# Patient Record
Sex: Female | Born: 1945
Health system: Southern US, Community
[De-identification: ages and names within clinical notes are randomized; demographics above are authoritative.]

## PROBLEM LIST (undated history)

## (undated) DIAGNOSIS — D509 Iron deficiency anemia, unspecified: Secondary | ICD-10-CM

## (undated) DIAGNOSIS — R0600 Dyspnea, unspecified: Secondary | ICD-10-CM

## (undated) DIAGNOSIS — D34 Benign neoplasm of thyroid gland: Secondary | ICD-10-CM

## (undated) DIAGNOSIS — C3490 Malignant neoplasm of unspecified part of unspecified bronchus or lung: Secondary | ICD-10-CM

## (undated) DIAGNOSIS — I2510 Atherosclerotic heart disease of native coronary artery without angina pectoris: Secondary | ICD-10-CM

## (undated) DIAGNOSIS — C801 Malignant (primary) neoplasm, unspecified: Secondary | ICD-10-CM

## (undated) DIAGNOSIS — J860 Pyothorax with fistula: Secondary | ICD-10-CM

## (undated) DIAGNOSIS — I10 Essential (primary) hypertension: Secondary | ICD-10-CM

## (undated) DIAGNOSIS — I739 Peripheral vascular disease, unspecified: Secondary | ICD-10-CM

## (undated) DIAGNOSIS — G2581 Restless legs syndrome: Secondary | ICD-10-CM

## (undated) DIAGNOSIS — J449 Chronic obstructive pulmonary disease, unspecified: Secondary | ICD-10-CM

## (undated) HISTORY — DX: Benign neoplasm of thyroid gland: D34

## (undated) HISTORY — DX: Peripheral vascular disease, unspecified: I73.9

## (undated) HISTORY — PX: CHOLECYSTECTOMY: SHX55

## (undated) HISTORY — DX: Pyothorax with fistula: J86.0

## (undated) HISTORY — DX: Malignant (primary) neoplasm, unspecified: C80.1

## (undated) HISTORY — DX: Atherosclerotic heart disease of native coronary artery without angina pectoris: I25.10

## (undated) HISTORY — DX: Essential (primary) hypertension: I10

## (undated) HISTORY — DX: Restless legs syndrome: G25.81

## (undated) HISTORY — DX: Malignant neoplasm of unspecified part of unspecified bronchus or lung: C34.90

## (undated) HISTORY — PX: OTHER SURGICAL HISTORY: SHX169

## (undated) HISTORY — DX: Iron deficiency anemia, unspecified: D50.9

## (undated) HISTORY — DX: Chronic obstructive pulmonary disease, unspecified: J44.9

## (undated) HISTORY — PX: APPENDECTOMY: SHX54

---

## 2003-10-21 HISTORY — PX: OTHER SURGICAL HISTORY: SHX169

## 2005-09-29 ENCOUNTER — Ambulatory Visit: Payer: Self-pay | Admitting: Family Medicine

## 2005-10-06 ENCOUNTER — Ambulatory Visit: Payer: Self-pay | Admitting: Family Medicine

## 2005-10-20 ENCOUNTER — Ambulatory Visit: Payer: Self-pay | Admitting: Family Medicine

## 2005-10-20 DIAGNOSIS — D34 Benign neoplasm of thyroid gland: Secondary | ICD-10-CM

## 2005-10-20 DIAGNOSIS — C801 Malignant (primary) neoplasm, unspecified: Secondary | ICD-10-CM

## 2005-10-20 HISTORY — DX: Benign neoplasm of thyroid gland: D34

## 2005-10-20 HISTORY — DX: Malignant (primary) neoplasm, unspecified: C80.1

## 2005-10-20 HISTORY — PX: THYROIDECTOMY, PARTIAL: SHX18

## 2005-11-18 ENCOUNTER — Ambulatory Visit: Payer: Self-pay | Admitting: Family Medicine

## 2005-12-30 ENCOUNTER — Ambulatory Visit: Payer: Self-pay | Admitting: Family Medicine

## 2006-01-23 ENCOUNTER — Ambulatory Visit: Payer: Self-pay | Admitting: Family Medicine

## 2006-02-13 ENCOUNTER — Ambulatory Visit (HOSPITAL_COMMUNITY): Admission: RE | Admit: 2006-02-13 | Discharge: 2006-02-13 | Payer: Self-pay | Admitting: Thoracic Surgery

## 2006-02-20 ENCOUNTER — Ambulatory Visit: Admission: RE | Admit: 2006-02-20 | Discharge: 2006-04-24 | Payer: Self-pay | Admitting: Radiation Oncology

## 2006-02-25 ENCOUNTER — Ambulatory Visit (HOSPITAL_COMMUNITY): Admission: RE | Admit: 2006-02-25 | Discharge: 2006-02-25 | Payer: Self-pay | Admitting: Radiation Oncology

## 2006-02-26 ENCOUNTER — Ambulatory Visit: Payer: Self-pay | Admitting: Family Medicine

## 2006-03-04 ENCOUNTER — Other Ambulatory Visit: Admission: RE | Admit: 2006-03-04 | Discharge: 2006-03-04 | Payer: Self-pay | Admitting: Interventional Radiology

## 2006-03-04 ENCOUNTER — Encounter (INDEPENDENT_AMBULATORY_CARE_PROVIDER_SITE_OTHER): Payer: Self-pay | Admitting: Specialist

## 2006-03-04 ENCOUNTER — Encounter: Admission: RE | Admit: 2006-03-04 | Discharge: 2006-03-04 | Payer: Self-pay | Admitting: Radiation Oncology

## 2006-03-12 ENCOUNTER — Inpatient Hospital Stay (HOSPITAL_COMMUNITY): Admission: RE | Admit: 2006-03-12 | Discharge: 2006-03-19 | Payer: Self-pay | Admitting: Thoracic Surgery

## 2006-03-12 ENCOUNTER — Encounter (INDEPENDENT_AMBULATORY_CARE_PROVIDER_SITE_OTHER): Payer: Self-pay | Admitting: *Deleted

## 2006-03-17 ENCOUNTER — Ambulatory Visit: Payer: Self-pay | Admitting: Internal Medicine

## 2006-03-19 ENCOUNTER — Ambulatory Visit: Payer: Self-pay | Admitting: Internal Medicine

## 2006-03-24 ENCOUNTER — Encounter: Admission: RE | Admit: 2006-03-24 | Discharge: 2006-03-24 | Payer: Self-pay | Admitting: Thoracic Surgery

## 2006-03-25 ENCOUNTER — Encounter: Admission: RE | Admit: 2006-03-25 | Discharge: 2006-03-25 | Payer: Self-pay | Admitting: Thoracic Surgery

## 2006-03-31 ENCOUNTER — Encounter: Admission: RE | Admit: 2006-03-31 | Discharge: 2006-03-31 | Payer: Self-pay | Admitting: Thoracic Surgery

## 2006-04-21 ENCOUNTER — Encounter: Admission: RE | Admit: 2006-04-21 | Discharge: 2006-04-21 | Payer: Self-pay | Admitting: Thoracic Surgery

## 2006-04-23 ENCOUNTER — Ambulatory Visit: Payer: Self-pay | Admitting: Family Medicine

## 2006-04-27 ENCOUNTER — Ambulatory Visit: Payer: Self-pay | Admitting: Internal Medicine

## 2006-04-27 LAB — BASIC METABOLIC PANEL
BUN: 9 mg/dL (ref 6–23)
CO2: 29 mEq/L (ref 19–32)
Calcium: 9.8 mg/dL (ref 8.4–10.5)
Chloride: 90 mEq/L — ABNORMAL LOW (ref 96–112)
Creatinine, Ser: 0.48 mg/dL (ref 0.40–1.20)
Glucose, Bld: 99 mg/dL (ref 70–99)
Potassium: 4.2 mEq/L (ref 3.5–5.3)
Sodium: 131 mEq/L — ABNORMAL LOW (ref 135–145)

## 2006-04-27 LAB — CBC WITH DIFFERENTIAL/PLATELET
BASO%: 0.3 % (ref 0.0–2.0)
Basophils Absolute: 0 10*3/uL (ref 0.0–0.1)
EOS%: 0.2 % (ref 0.0–7.0)
Eosinophils Absolute: 0 10*3/uL (ref 0.0–0.5)
HCT: 34.9 % (ref 34.8–46.6)
HGB: 12.3 g/dL (ref 11.6–15.9)
LYMPH%: 10.7 % — ABNORMAL LOW (ref 14.0–48.0)
MCH: 31.8 pg (ref 26.0–34.0)
MCHC: 35.1 g/dL (ref 32.0–36.0)
MCV: 90.7 fL (ref 81.0–101.0)
MONO#: 1.4 10*3/uL — ABNORMAL HIGH (ref 0.1–0.9)
MONO%: 14.7 % — ABNORMAL HIGH (ref 0.0–13.0)
NEUT#: 7.1 10*3/uL — ABNORMAL HIGH (ref 1.5–6.5)
NEUT%: 74.1 % (ref 39.6–76.8)
Platelets: 513 10*3/uL — ABNORMAL HIGH (ref 145–400)
RBC: 3.85 10*6/uL (ref 3.70–5.32)
RDW: 13.1 % (ref 11.3–14.5)
WBC: 9.5 10*3/uL (ref 3.9–10.0)
lymph#: 1 10*3/uL (ref 0.9–3.3)

## 2006-05-06 ENCOUNTER — Ambulatory Visit: Payer: Self-pay | Admitting: Family Medicine

## 2006-05-27 ENCOUNTER — Ambulatory Visit: Payer: Self-pay | Admitting: Family Medicine

## 2006-06-18 ENCOUNTER — Encounter (INDEPENDENT_AMBULATORY_CARE_PROVIDER_SITE_OTHER): Payer: Self-pay | Admitting: *Deleted

## 2006-06-18 ENCOUNTER — Ambulatory Visit (HOSPITAL_COMMUNITY): Admission: RE | Admit: 2006-06-18 | Discharge: 2006-06-19 | Payer: Self-pay | Admitting: Surgery

## 2006-06-30 ENCOUNTER — Ambulatory Visit: Payer: Self-pay | Admitting: Family Medicine

## 2006-07-16 ENCOUNTER — Ambulatory Visit: Payer: Self-pay | Admitting: Internal Medicine

## 2006-07-20 ENCOUNTER — Ambulatory Visit (HOSPITAL_COMMUNITY): Admission: RE | Admit: 2006-07-20 | Discharge: 2006-07-20 | Payer: Self-pay | Admitting: Internal Medicine

## 2006-07-20 LAB — COMPREHENSIVE METABOLIC PANEL
ALT: 21 U/L (ref 0–40)
AST: 18 U/L (ref 0–37)
Albumin: 2.9 g/dL — ABNORMAL LOW (ref 3.5–5.2)
Alkaline Phosphatase: 142 U/L — ABNORMAL HIGH (ref 39–117)
BUN: 7 mg/dL (ref 6–23)
CO2: 33 mEq/L — ABNORMAL HIGH (ref 19–32)
Calcium: 9.4 mg/dL (ref 8.4–10.5)
Chloride: 93 mEq/L — ABNORMAL LOW (ref 96–112)
Creatinine, Ser: 0.56 mg/dL (ref 0.40–1.20)
Glucose, Bld: 105 mg/dL — ABNORMAL HIGH (ref 70–99)
Potassium: 4.5 mEq/L (ref 3.5–5.3)
Sodium: 133 mEq/L — ABNORMAL LOW (ref 135–145)
Total Bilirubin: 0.4 mg/dL (ref 0.3–1.2)
Total Protein: 7.1 g/dL (ref 6.0–8.3)

## 2006-07-20 LAB — CBC WITH DIFFERENTIAL/PLATELET
BASO%: 0.6 % (ref 0.0–2.0)
Basophils Absolute: 0 10*3/uL (ref 0.0–0.1)
EOS%: 2.7 % (ref 0.0–7.0)
Eosinophils Absolute: 0.2 10*3/uL (ref 0.0–0.5)
HCT: 33.4 % — ABNORMAL LOW (ref 34.8–46.6)
HGB: 11.6 g/dL (ref 11.6–15.9)
LYMPH%: 15.2 % (ref 14.0–48.0)
MCH: 30.1 pg (ref 26.0–34.0)
MCHC: 34.8 g/dL (ref 32.0–36.0)
MCV: 86.4 fL (ref 81.0–101.0)
MONO#: 0.8 10*3/uL (ref 0.1–0.9)
MONO%: 13.2 % — ABNORMAL HIGH (ref 0.0–13.0)
NEUT#: 3.9 10*3/uL (ref 1.5–6.5)
NEUT%: 68.3 % (ref 39.6–76.8)
Platelets: 647 10*3/uL — ABNORMAL HIGH (ref 145–400)
RBC: 3.87 10*6/uL (ref 3.70–5.32)
RDW: 13.4 % (ref 11.3–14.5)
WBC: 5.7 10*3/uL (ref 3.9–10.0)
lymph#: 0.9 10*3/uL (ref 0.9–3.3)

## 2006-07-28 DIAGNOSIS — I70219 Atherosclerosis of native arteries of extremities with intermittent claudication, unspecified extremity: Secondary | ICD-10-CM | POA: Insufficient documentation

## 2006-07-28 DIAGNOSIS — Z85118 Personal history of other malignant neoplasm of bronchus and lung: Secondary | ICD-10-CM | POA: Insufficient documentation

## 2006-07-28 DIAGNOSIS — F411 Generalized anxiety disorder: Secondary | ICD-10-CM | POA: Insufficient documentation

## 2006-07-28 DIAGNOSIS — F339 Major depressive disorder, recurrent, unspecified: Secondary | ICD-10-CM | POA: Insufficient documentation

## 2006-07-28 DIAGNOSIS — E7800 Pure hypercholesterolemia, unspecified: Secondary | ICD-10-CM | POA: Insufficient documentation

## 2006-07-28 DIAGNOSIS — J449 Chronic obstructive pulmonary disease, unspecified: Secondary | ICD-10-CM | POA: Insufficient documentation

## 2006-07-28 DIAGNOSIS — I10 Essential (primary) hypertension: Secondary | ICD-10-CM

## 2006-08-21 ENCOUNTER — Ambulatory Visit: Payer: Self-pay | Admitting: Family Medicine

## 2006-08-25 ENCOUNTER — Encounter: Payer: Self-pay | Admitting: Family Medicine

## 2006-08-25 ENCOUNTER — Ambulatory Visit: Payer: Self-pay | Admitting: Family Medicine

## 2006-09-04 ENCOUNTER — Telehealth: Payer: Self-pay | Admitting: Family Medicine

## 2006-09-07 ENCOUNTER — Ambulatory Visit: Payer: Self-pay | Admitting: Family Medicine

## 2006-09-09 LAB — CONVERTED CEMR LAB
ALT: 16 units/L (ref 0–35)
AST: 12 units/L (ref 0–37)
Albumin: 3.5 g/dL (ref 3.5–5.2)
Alkaline Phosphatase: 141 units/L — ABNORMAL HIGH (ref 39–117)
BUN: 9 mg/dL (ref 6–23)
Basophils Absolute: 0 10*3/uL (ref 0.0–0.1)
Basophils Relative: 0 % (ref 0–1)
CO2: 27 meq/L (ref 19–32)
Calcium: 9.2 mg/dL (ref 8.4–10.5)
Chloride: 90 meq/L — ABNORMAL LOW (ref 96–112)
Creatinine, Ser: 0.51 mg/dL (ref 0.40–1.20)
Eosinophils Relative: 1 % (ref 0–4)
Glucose, Bld: 137 mg/dL — ABNORMAL HIGH (ref 70–99)
HCT: 31 % — ABNORMAL LOW (ref 34.4–43.3)
Hemoglobin: 9.9 g/dL — ABNORMAL LOW (ref 11.7–14.8)
Lymphocytes Relative: 8 % — ABNORMAL LOW (ref 15–43)
Lymphs Abs: 0.9 10*3/uL (ref 0.8–3.1)
MCHC: 31.9 g/dL — ABNORMAL LOW (ref 33.1–35.4)
MCV: 88.3 fL (ref 78.8–100.0)
Monocytes Absolute: 1.3 10*3/uL — ABNORMAL HIGH (ref 0.2–0.7)
Monocytes Relative: 11 % (ref 3–11)
Neutro Abs: 9.3 10*3/uL — ABNORMAL HIGH (ref 1.8–6.8)
Neutrophils Relative %: 80 % — ABNORMAL HIGH (ref 47–77)
Platelets: 762 10*3/uL — ABNORMAL HIGH (ref 152–374)
Potassium: 4.5 meq/L (ref 3.5–5.3)
RBC: 3.51 M/uL — ABNORMAL LOW (ref 3.79–4.96)
RDW: 14.4 % (ref 11.5–15.3)
Sodium: 131 meq/L — ABNORMAL LOW (ref 135–145)
Total Bilirubin: 0.2 mg/dL — ABNORMAL LOW (ref 0.3–1.2)
Total Protein: 7.5 g/dL (ref 6.0–8.3)
WBC: 11.6 10*3/uL — ABNORMAL HIGH (ref 3.7–10.0)

## 2006-09-15 ENCOUNTER — Encounter: Payer: Self-pay | Admitting: Family Medicine

## 2006-10-05 ENCOUNTER — Telehealth: Payer: Self-pay | Admitting: Family Medicine

## 2006-10-14 ENCOUNTER — Telehealth: Payer: Self-pay | Admitting: Family Medicine

## 2006-10-15 ENCOUNTER — Encounter: Payer: Self-pay | Admitting: Family Medicine

## 2006-10-26 ENCOUNTER — Ambulatory Visit: Payer: Self-pay | Admitting: Internal Medicine

## 2006-10-29 ENCOUNTER — Ambulatory Visit (HOSPITAL_COMMUNITY): Admission: RE | Admit: 2006-10-29 | Discharge: 2006-10-29 | Payer: Self-pay | Admitting: Internal Medicine

## 2006-10-29 ENCOUNTER — Encounter (HOSPITAL_COMMUNITY): Admission: RE | Admit: 2006-10-29 | Discharge: 2006-12-30 | Payer: Self-pay | Admitting: Internal Medicine

## 2006-10-29 LAB — CBC WITH DIFFERENTIAL/PLATELET
BASO%: 0 % (ref 0.0–2.0)
Basophils Absolute: 0 10*3/uL (ref 0.0–0.1)
EOS%: 0.3 % (ref 0.0–7.0)
Eosinophils Absolute: 0 10*3/uL (ref 0.0–0.5)
HCT: 23.4 % — ABNORMAL LOW (ref 34.8–46.6)
HGB: 7.8 g/dL — ABNORMAL LOW (ref 11.6–15.9)
LYMPH%: 5.7 % — ABNORMAL LOW (ref 14.0–48.0)
MCH: 27.3 pg (ref 26.0–34.0)
MCHC: 33.5 g/dL (ref 32.0–36.0)
MCV: 81.5 fL (ref 81.0–101.0)
MONO#: 1.1 10*3/uL — ABNORMAL HIGH (ref 0.1–0.9)
MONO%: 8.7 % (ref 0.0–13.0)
NEUT#: 10.8 10*3/uL — ABNORMAL HIGH (ref 1.5–6.5)
NEUT%: 85.3 % — ABNORMAL HIGH (ref 39.6–76.8)
Platelets: 1115 10*3/uL — ABNORMAL HIGH (ref 145–400)
RBC: 2.87 10*6/uL — ABNORMAL LOW (ref 3.70–5.32)
RDW: 15.7 % — ABNORMAL HIGH (ref 11.3–14.5)
WBC: 12.7 10*3/uL — ABNORMAL HIGH (ref 3.9–10.0)
lymph#: 0.7 10*3/uL — ABNORMAL LOW (ref 0.9–3.3)

## 2006-10-29 LAB — LACTATE DEHYDROGENASE: LDH: 94 U/L (ref 94–250)

## 2006-10-29 LAB — COMPREHENSIVE METABOLIC PANEL
ALT: 10 U/L (ref 0–35)
AST: 10 U/L (ref 0–37)
Albumin: 1.9 g/dL — ABNORMAL LOW (ref 3.5–5.2)
Alkaline Phosphatase: 128 U/L — ABNORMAL HIGH (ref 39–117)
BUN: 9 mg/dL (ref 6–23)
CO2: 31 mEq/L (ref 19–32)
Calcium: 8.3 mg/dL — ABNORMAL LOW (ref 8.4–10.5)
Chloride: 91 mEq/L — ABNORMAL LOW (ref 96–112)
Creatinine, Ser: 0.41 mg/dL (ref 0.40–1.20)
Glucose, Bld: 130 mg/dL — ABNORMAL HIGH (ref 70–99)
Potassium: 3.8 mEq/L (ref 3.5–5.3)
Sodium: 129 mEq/L — ABNORMAL LOW (ref 135–145)
Total Bilirubin: 0.5 mg/dL (ref 0.3–1.2)
Total Protein: 6.8 g/dL (ref 6.0–8.3)

## 2006-10-30 LAB — TYPE & CROSSMATCH - CHCC

## 2006-11-02 LAB — CBC WITH DIFFERENTIAL/PLATELET
BASO%: 0.2 % (ref 0.0–2.0)
Basophils Absolute: 0 10*3/uL (ref 0.0–0.1)
EOS%: 0.2 % (ref 0.0–7.0)
Eosinophils Absolute: 0 10*3/uL (ref 0.0–0.5)
HCT: 30.9 % — ABNORMAL LOW (ref 34.8–46.6)
HGB: 10.2 g/dL — ABNORMAL LOW (ref 11.6–15.9)
LYMPH%: 5.6 % — ABNORMAL LOW (ref 14.0–48.0)
MCH: 27.2 pg (ref 26.0–34.0)
MCHC: 33.1 g/dL (ref 32.0–36.0)
MCV: 82.1 fL (ref 81.0–101.0)
MONO#: 1 10*3/uL — ABNORMAL HIGH (ref 0.1–0.9)
MONO%: 7.8 % (ref 0.0–13.0)
NEUT#: 11.4 10*3/uL — ABNORMAL HIGH (ref 1.5–6.5)
NEUT%: 86.2 % — ABNORMAL HIGH (ref 39.6–76.8)
Platelets: 1073 10*3/uL — ABNORMAL HIGH (ref 145–400)
RBC: 3.77 10*6/uL (ref 3.70–5.32)
RDW: 15.8 % — ABNORMAL HIGH (ref 11.3–14.5)
WBC: 13.2 10*3/uL — ABNORMAL HIGH (ref 3.9–10.0)
lymph#: 0.7 10*3/uL — ABNORMAL LOW (ref 0.9–3.3)

## 2006-11-03 LAB — IRON AND TIBC
Iron: <10 ug/dL — ABNORMAL LOW (ref 42–145)
UIBC: 166 ug/dL

## 2006-11-03 LAB — FERRITIN: Ferritin: 303 ng/mL — ABNORMAL HIGH (ref 10–291)

## 2006-11-03 LAB — VITAMIN B12: Vitamin B-12: 879 pg/mL (ref 211–911)

## 2006-11-03 LAB — FOLATE RBC: RBC Folate: 618 ng/mL — ABNORMAL HIGH (ref 180–600)

## 2006-11-09 ENCOUNTER — Encounter: Payer: Self-pay | Admitting: Family Medicine

## 2006-11-09 LAB — CBC WITH DIFFERENTIAL/PLATELET
BASO%: 0.5 % (ref 0.0–2.0)
Basophils Absolute: 0.1 10*3/uL (ref 0.0–0.1)
EOS%: 1.6 % (ref 0.0–7.0)
Eosinophils Absolute: 0.2 10*3/uL (ref 0.0–0.5)
HCT: 29.4 % — ABNORMAL LOW (ref 34.8–46.6)
HGB: 9.6 g/dL — ABNORMAL LOW (ref 11.6–15.9)
LYMPH%: 7.4 % — ABNORMAL LOW (ref 14.0–48.0)
MCH: 26.4 pg (ref 26.0–34.0)
MCHC: 32.7 g/dL (ref 32.0–36.0)
MCV: 80.7 fL — ABNORMAL LOW (ref 81.0–101.0)
MONO#: 1 10*3/uL — ABNORMAL HIGH (ref 0.1–0.9)
MONO%: 7.2 % (ref 0.0–13.0)
NEUT#: 11.9 10*3/uL — ABNORMAL HIGH (ref 1.5–6.5)
NEUT%: 83.3 % — ABNORMAL HIGH (ref 39.6–76.8)
Platelets: 902 10*3/uL — ABNORMAL HIGH (ref 145–400)
RBC: 3.64 10*6/uL — ABNORMAL LOW (ref 3.70–5.32)
RDW: 13.1 % (ref 11.3–14.5)
WBC: 14.3 10*3/uL — ABNORMAL HIGH (ref 3.9–10.0)
lymph#: 1.1 10*3/uL (ref 0.9–3.3)

## 2006-11-12 ENCOUNTER — Encounter (INDEPENDENT_AMBULATORY_CARE_PROVIDER_SITE_OTHER): Payer: Self-pay | Admitting: Specialist

## 2006-11-12 ENCOUNTER — Ambulatory Visit (HOSPITAL_COMMUNITY): Admission: RE | Admit: 2006-11-12 | Discharge: 2006-11-12 | Payer: Self-pay | Admitting: Thoracic Surgery

## 2006-11-13 LAB — FECAL OCCULT BLOOD, GUAIAC: Occult Blood: NEGATIVE

## 2006-11-18 ENCOUNTER — Encounter: Payer: Self-pay | Admitting: Family Medicine

## 2006-11-20 DIAGNOSIS — D509 Iron deficiency anemia, unspecified: Secondary | ICD-10-CM

## 2006-11-20 HISTORY — DX: Iron deficiency anemia, unspecified: D50.9

## 2006-11-30 ENCOUNTER — Inpatient Hospital Stay (HOSPITAL_COMMUNITY): Admission: RE | Admit: 2006-11-30 | Discharge: 2006-12-08 | Payer: Self-pay | Admitting: Thoracic Surgery

## 2006-11-30 ENCOUNTER — Encounter (INDEPENDENT_AMBULATORY_CARE_PROVIDER_SITE_OTHER): Payer: Self-pay | Admitting: *Deleted

## 2006-11-30 ENCOUNTER — Ambulatory Visit: Payer: Self-pay | Admitting: Thoracic Surgery

## 2006-12-11 ENCOUNTER — Ambulatory Visit: Payer: Self-pay | Admitting: Internal Medicine

## 2006-12-14 ENCOUNTER — Encounter (HOSPITAL_COMMUNITY): Admission: RE | Admit: 2006-12-14 | Discharge: 2007-03-14 | Payer: Self-pay | Admitting: Thoracic Surgery

## 2006-12-15 ENCOUNTER — Ambulatory Visit: Payer: Self-pay | Admitting: Thoracic Surgery

## 2006-12-15 ENCOUNTER — Encounter: Admission: RE | Admit: 2006-12-15 | Discharge: 2006-12-15 | Payer: Self-pay | Admitting: Thoracic Surgery

## 2006-12-22 ENCOUNTER — Ambulatory Visit: Payer: Self-pay | Admitting: Thoracic Surgery

## 2006-12-22 ENCOUNTER — Encounter: Admission: RE | Admit: 2006-12-22 | Discharge: 2006-12-22 | Payer: Self-pay | Admitting: Thoracic Surgery

## 2007-01-04 ENCOUNTER — Encounter: Payer: Self-pay | Admitting: Family Medicine

## 2007-01-04 DIAGNOSIS — D509 Iron deficiency anemia, unspecified: Secondary | ICD-10-CM | POA: Insufficient documentation

## 2007-01-05 ENCOUNTER — Encounter: Admission: RE | Admit: 2007-01-05 | Discharge: 2007-01-05 | Payer: Self-pay | Admitting: Thoracic Surgery

## 2007-01-05 ENCOUNTER — Ambulatory Visit: Payer: Self-pay | Admitting: Thoracic Surgery

## 2007-01-19 HISTORY — PX: OTHER SURGICAL HISTORY: SHX169

## 2007-01-20 ENCOUNTER — Encounter: Admission: RE | Admit: 2007-01-20 | Discharge: 2007-01-20 | Payer: Self-pay | Admitting: Thoracic Surgery

## 2007-01-20 ENCOUNTER — Ambulatory Visit: Payer: Self-pay | Admitting: Thoracic Surgery

## 2007-02-10 ENCOUNTER — Ambulatory Visit: Payer: Self-pay | Admitting: Thoracic Surgery

## 2007-02-10 ENCOUNTER — Encounter: Admission: RE | Admit: 2007-02-10 | Discharge: 2007-02-10 | Payer: Self-pay | Admitting: Thoracic Surgery

## 2007-02-10 ENCOUNTER — Encounter: Payer: Self-pay | Admitting: Family Medicine

## 2007-02-17 ENCOUNTER — Ambulatory Visit: Payer: Self-pay | Admitting: Family Medicine

## 2007-02-18 ENCOUNTER — Encounter: Payer: Self-pay | Admitting: Family Medicine

## 2007-02-18 ENCOUNTER — Telehealth (INDEPENDENT_AMBULATORY_CARE_PROVIDER_SITE_OTHER): Payer: Self-pay | Admitting: *Deleted

## 2007-02-18 LAB — CONVERTED CEMR LAB
ALT: 13 units/L (ref 0–35)
AST: 18 units/L (ref 0–37)
Albumin: 3.8 g/dL (ref 3.5–5.2)
Alkaline Phosphatase: 111 units/L (ref 39–117)
BUN: 9 mg/dL (ref 6–23)
Basophils Absolute: 0 10*3/uL (ref 0.0–0.1)
Basophils Relative: 1 % (ref 0–1)
CO2: 27 meq/L (ref 19–32)
Calcium: 9.2 mg/dL (ref 8.4–10.5)
Chloride: 99 meq/L (ref 96–112)
Creatinine, Ser: 0.41 mg/dL (ref 0.40–1.20)
Direct LDL: 98 mg/dL
Eosinophils Absolute: 0.1 10*3/uL (ref 0.0–0.7)
Eosinophils Relative: 2 % (ref 0–5)
Ferritin: 113 ng/mL (ref 10–291)
Glucose, Bld: 100 mg/dL — ABNORMAL HIGH (ref 70–99)
HCT: 38.8 % (ref 36.0–46.0)
Hemoglobin: 12.2 g/dL (ref 12.0–15.0)
Iron: 65 ug/dL (ref 42–145)
Lymphocytes Relative: 26 % (ref 12–46)
Lymphs Abs: 1.7 10*3/uL (ref 0.7–3.3)
MCHC: 31.4 g/dL (ref 30.0–36.0)
MCV: 91.5 fL (ref 78.0–100.0)
Monocytes Absolute: 0.7 10*3/uL (ref 0.2–0.7)
Monocytes Relative: 11 % (ref 3–11)
Neutro Abs: 3.9 10*3/uL (ref 1.7–7.7)
Neutrophils Relative %: 61 % (ref 43–77)
Platelets: 405 10*3/uL — ABNORMAL HIGH (ref 150–400)
Potassium: 4.5 meq/L (ref 3.5–5.3)
RBC: 4.24 M/uL (ref 3.87–5.11)
RDW: 16.9 % — ABNORMAL HIGH (ref 11.5–14.0)
Saturation Ratios: 24 % (ref 20–55)
Sodium: 139 meq/L (ref 135–145)
TIBC: 271 ug/dL (ref 250–470)
TSH: 3.134 microintl units/mL (ref 0.350–5.50)
Total Bilirubin: 0.3 mg/dL (ref 0.3–1.2)
Total Protein: 7.3 g/dL (ref 6.0–8.3)
UIBC: 206 ug/dL
WBC: 6.5 10*3/uL (ref 4.0–10.5)

## 2007-04-07 ENCOUNTER — Encounter: Admission: RE | Admit: 2007-04-07 | Discharge: 2007-04-07 | Payer: Self-pay | Admitting: Thoracic Surgery

## 2007-04-07 ENCOUNTER — Ambulatory Visit: Payer: Self-pay | Admitting: Thoracic Surgery

## 2007-05-05 ENCOUNTER — Encounter: Admission: RE | Admit: 2007-05-05 | Discharge: 2007-05-05 | Payer: Self-pay | Admitting: Thoracic Surgery

## 2007-05-05 ENCOUNTER — Ambulatory Visit: Payer: Self-pay | Admitting: Thoracic Surgery

## 2007-05-19 ENCOUNTER — Ambulatory Visit: Payer: Self-pay | Admitting: Family Medicine

## 2007-05-19 DIAGNOSIS — I259 Chronic ischemic heart disease, unspecified: Secondary | ICD-10-CM | POA: Insufficient documentation

## 2007-05-19 DIAGNOSIS — E890 Postprocedural hypothyroidism: Secondary | ICD-10-CM | POA: Insufficient documentation

## 2007-05-19 HISTORY — DX: Chronic ischemic heart disease, unspecified: I25.9

## 2007-06-11 ENCOUNTER — Encounter: Payer: Self-pay | Admitting: Family Medicine

## 2007-06-11 LAB — CONVERTED CEMR LAB

## 2007-07-02 ENCOUNTER — Encounter: Payer: Self-pay | Admitting: Family Medicine

## 2007-08-04 ENCOUNTER — Ambulatory Visit: Payer: Self-pay | Admitting: Thoracic Surgery

## 2007-08-04 ENCOUNTER — Encounter: Admission: RE | Admit: 2007-08-04 | Discharge: 2007-08-04 | Payer: Self-pay | Admitting: Thoracic Surgery

## 2007-08-19 ENCOUNTER — Ambulatory Visit: Payer: Self-pay | Admitting: Family Medicine

## 2007-08-23 ENCOUNTER — Encounter: Payer: Self-pay | Admitting: Family Medicine

## 2007-08-24 LAB — CONVERTED CEMR LAB
ALT: 20 units/L (ref 0–35)
AST: 19 units/L (ref 0–37)
Albumin: 4.4 g/dL (ref 3.5–5.2)
Alkaline Phosphatase: 98 units/L (ref 39–117)
BUN: 14 mg/dL (ref 6–23)
CO2: 27 meq/L (ref 19–32)
Calcium: 9.7 mg/dL (ref 8.4–10.5)
Chloride: 100 meq/L (ref 96–112)
Cholesterol: 194 mg/dL (ref 0–200)
Creatinine, Ser: 0.49 mg/dL (ref 0.40–1.20)
Ferritin: 126 ng/mL (ref 10–291)
Free T4: 1.06 ng/dL (ref 0.89–1.80)
Glucose, Bld: 91 mg/dL (ref 70–99)
HCT: 42.3 % (ref 36.0–46.0)
HDL: 65 mg/dL (ref >39)
Hemoglobin: 13.8 g/dL (ref 12.0–15.0)
LDL Cholesterol: 99 mg/dL (ref 0–99)
MCHC: 32.6 g/dL (ref 30.0–36.0)
MCV: 95.3 fL (ref 78.0–100.0)
Platelets: 308 10*3/uL (ref 150–400)
Potassium: 4 meq/L (ref 3.5–5.3)
RBC: 4.44 M/uL (ref 3.87–5.11)
RDW: 13.3 % (ref 11.5–14.0)
Sodium: 140 meq/L (ref 135–145)
T3, Free: 3.1 pg/mL (ref 2.3–4.2)
TSH: 2.817 microintl units/mL (ref 0.350–5.50)
Total Bilirubin: 0.4 mg/dL (ref 0.3–1.2)
Total CHOL/HDL Ratio: 3
Total Protein: 7.4 g/dL (ref 6.0–8.3)
Triglycerides: 148 mg/dL (ref <150)
VLDL: 30 mg/dL (ref 0–40)
WBC: 4.9 10*3/uL (ref 4.0–10.5)

## 2007-10-06 ENCOUNTER — Ambulatory Visit: Payer: Self-pay | Admitting: Thoracic Surgery

## 2007-10-06 ENCOUNTER — Encounter: Admission: RE | Admit: 2007-10-06 | Discharge: 2007-10-06 | Payer: Self-pay | Admitting: Thoracic Surgery

## 2008-01-05 ENCOUNTER — Ambulatory Visit: Payer: Self-pay | Admitting: Thoracic Surgery

## 2008-01-05 ENCOUNTER — Encounter: Payer: Self-pay | Admitting: Family Medicine

## 2008-01-05 ENCOUNTER — Encounter: Admission: RE | Admit: 2008-01-05 | Discharge: 2008-01-05 | Payer: Self-pay | Admitting: Thoracic Surgery

## 2008-05-17 ENCOUNTER — Ambulatory Visit: Payer: Self-pay | Admitting: Family Medicine

## 2008-07-18 ENCOUNTER — Ambulatory Visit: Payer: Self-pay | Admitting: Thoracic Surgery

## 2008-07-18 ENCOUNTER — Encounter: Admission: RE | Admit: 2008-07-18 | Discharge: 2008-07-18 | Payer: Self-pay | Admitting: Thoracic Surgery

## 2008-08-21 ENCOUNTER — Ambulatory Visit: Payer: Self-pay | Admitting: Family Medicine

## 2008-08-22 LAB — CONVERTED CEMR LAB
ALT: 23 units/L (ref 0–35)
AST: 22 units/L (ref 0–37)
Albumin: 4.3 g/dL (ref 3.5–5.2)
Alkaline Phosphatase: 93 units/L (ref 39–117)
BUN: 13 mg/dL (ref 6–23)
CO2: 26 meq/L (ref 19–32)
Calcium: 9.6 mg/dL (ref 8.4–10.5)
Chloride: 102 meq/L (ref 96–112)
Cholesterol: 207 mg/dL — ABNORMAL HIGH (ref 0–200)
Creatinine, Ser: 0.45 mg/dL (ref 0.40–1.20)
Ferritin: 118 ng/mL (ref 10–291)
Glucose, Bld: 129 mg/dL — ABNORMAL HIGH (ref 70–99)
HCT: 42.5 % (ref 36.0–46.0)
HDL: 57 mg/dL (ref >39)
Hemoglobin: 13.8 g/dL (ref 12.0–15.0)
Iron: 120 ug/dL (ref 42–145)
LDL Cholesterol: 120 mg/dL — ABNORMAL HIGH (ref 0–99)
MCHC: 32.5 g/dL (ref 30.0–36.0)
MCV: 94.7 fL (ref 78.0–100.0)
Platelets: 329 10*3/uL (ref 150–400)
Potassium: 4.3 meq/L (ref 3.5–5.3)
RBC: 4.49 M/uL (ref 3.87–5.11)
RDW: 13.5 % (ref 11.5–15.5)
Saturation Ratios: 44 % (ref 20–55)
Sodium: 140 meq/L (ref 135–145)
TIBC: 274 ug/dL (ref 250–470)
TSH: 3.666 microintl units/mL (ref 0.350–4.50)
Total Bilirubin: 0.4 mg/dL (ref 0.3–1.2)
Total CHOL/HDL Ratio: 3.6
Total Protein: 7 g/dL (ref 6.0–8.3)
Triglycerides: 149 mg/dL (ref <150)
UIBC: 154 ug/dL
VLDL: 30 mg/dL (ref 0–40)
WBC: 5.8 10*3/uL (ref 4.0–10.5)

## 2008-08-23 ENCOUNTER — Ambulatory Visit: Payer: Self-pay | Admitting: Family Medicine

## 2008-08-23 LAB — CONVERTED CEMR LAB: Blood Glucose, Fasting: 102 mg/dL

## 2008-11-08 ENCOUNTER — Ambulatory Visit: Payer: Self-pay | Admitting: Family Medicine

## 2008-11-29 ENCOUNTER — Telehealth: Payer: Self-pay | Admitting: Family Medicine

## 2008-12-27 ENCOUNTER — Encounter: Payer: Self-pay | Admitting: Family Medicine

## 2008-12-27 ENCOUNTER — Ambulatory Visit: Payer: Self-pay | Admitting: Thoracic Surgery

## 2008-12-27 ENCOUNTER — Encounter: Admission: RE | Admit: 2008-12-27 | Discharge: 2008-12-27 | Payer: Self-pay | Admitting: Thoracic Surgery

## 2009-01-08 ENCOUNTER — Ambulatory Visit: Payer: Self-pay | Admitting: Family Medicine

## 2009-01-09 LAB — CONVERTED CEMR LAB
Glucose, Bld: 95 mg/dL (ref 70–99)
TSH: 3.806 microintl units/mL (ref 0.350–4.500)

## 2009-01-11 ENCOUNTER — Telehealth: Payer: Self-pay | Admitting: Family Medicine

## 2009-02-05 ENCOUNTER — Telehealth: Payer: Self-pay | Admitting: Family Medicine

## 2009-02-19 ENCOUNTER — Ambulatory Visit: Payer: Self-pay | Admitting: Family Medicine

## 2009-03-05 ENCOUNTER — Encounter: Payer: Self-pay | Admitting: Family Medicine

## 2009-05-22 LAB — HM MAMMOGRAPHY: HM Mammogram: NORMAL

## 2009-05-28 ENCOUNTER — Ambulatory Visit: Payer: Self-pay | Admitting: Family Medicine

## 2009-05-29 LAB — CONVERTED CEMR LAB
HCT: 42.9 % (ref 36.0–46.0)
Hemoglobin: 13.8 g/dL (ref 12.0–15.0)
Iron: 108 ug/dL (ref 42–145)
MCHC: 32.2 g/dL (ref 30.0–36.0)
MCV: 93.9 fL (ref 78.0–100.0)
Platelets: 311 10*3/uL (ref 150–400)
RBC: 4.57 M/uL (ref 3.87–5.11)
RDW: 13.8 % (ref 11.5–15.5)
Saturation Ratios: 40 % (ref 20–55)
TIBC: 267 ug/dL (ref 250–470)
UIBC: 159 ug/dL
WBC: 6.5 10*3/uL (ref 4.0–10.5)

## 2009-06-07 ENCOUNTER — Ambulatory Visit: Payer: Self-pay | Admitting: Critical Care Medicine

## 2009-06-11 ENCOUNTER — Telehealth: Payer: Self-pay | Admitting: Critical Care Medicine

## 2009-06-13 ENCOUNTER — Encounter: Payer: Self-pay | Admitting: Critical Care Medicine

## 2009-06-20 DIAGNOSIS — J449 Chronic obstructive pulmonary disease, unspecified: Secondary | ICD-10-CM

## 2009-06-20 HISTORY — DX: Chronic obstructive pulmonary disease, unspecified: J44.9

## 2009-06-26 ENCOUNTER — Ambulatory Visit: Payer: Self-pay | Admitting: Critical Care Medicine

## 2009-06-27 ENCOUNTER — Encounter: Admission: RE | Admit: 2009-06-27 | Discharge: 2009-06-27 | Payer: Self-pay | Admitting: Thoracic Surgery

## 2009-06-27 ENCOUNTER — Ambulatory Visit: Payer: Self-pay | Admitting: Thoracic Surgery

## 2009-06-27 ENCOUNTER — Encounter: Payer: Self-pay | Admitting: Critical Care Medicine

## 2009-07-23 ENCOUNTER — Ambulatory Visit: Payer: Self-pay | Admitting: Family Medicine

## 2009-07-24 LAB — CONVERTED CEMR LAB: TSH: 2.307 microintl units/mL (ref 0.350–4.500)

## 2009-08-02 ENCOUNTER — Ambulatory Visit: Payer: Self-pay | Admitting: Critical Care Medicine

## 2009-08-22 ENCOUNTER — Encounter: Payer: Self-pay | Admitting: Family Medicine

## 2009-08-22 LAB — HM COLONOSCOPY: HM Colonoscopy: NORMAL

## 2009-08-23 ENCOUNTER — Encounter: Payer: Self-pay | Admitting: Family Medicine

## 2009-08-27 ENCOUNTER — Encounter: Payer: Self-pay | Admitting: Family Medicine

## 2009-09-19 ENCOUNTER — Ambulatory Visit: Payer: Self-pay | Admitting: Family Medicine

## 2009-09-20 ENCOUNTER — Telehealth (INDEPENDENT_AMBULATORY_CARE_PROVIDER_SITE_OTHER): Payer: Self-pay | Admitting: *Deleted

## 2009-09-21 ENCOUNTER — Encounter: Payer: Self-pay | Admitting: Family Medicine

## 2009-09-24 LAB — CONVERTED CEMR LAB
ALT: 19 units/L (ref 0–35)
AST: 18 units/L (ref 0–37)
Albumin: 4.2 g/dL (ref 3.5–5.2)
Alkaline Phosphatase: 89 units/L (ref 39–117)
BUN: 15 mg/dL (ref 6–23)
Basophils Absolute: 0 10*3/uL (ref 0.0–0.1)
Basophils Relative: 0 % (ref 0–1)
CO2: 29 meq/L (ref 19–32)
Calcium: 8.8 mg/dL (ref 8.4–10.5)
Chloride: 101 meq/L (ref 96–112)
Cholesterol: 196 mg/dL (ref 0–200)
Creatinine, Ser: 0.52 mg/dL (ref 0.40–1.20)
Eosinophils Absolute: 0.1 10*3/uL (ref 0.0–0.7)
Eosinophils Relative: 1 % (ref 0–5)
Glucose, Bld: 88 mg/dL (ref 70–99)
HCT: 40.3 % (ref 36.0–46.0)
HDL: 68 mg/dL (ref >39)
Hemoglobin: 13.3 g/dL (ref 12.0–15.0)
Iron: 102 ug/dL (ref 42–145)
LDL Cholesterol: 110 mg/dL — ABNORMAL HIGH (ref 0–99)
Lymphocytes Relative: 37 % (ref 12–46)
Lymphs Abs: 3.8 10*3/uL (ref 0.7–4.0)
MCHC: 33 g/dL (ref 30.0–36.0)
MCV: 91.8 fL (ref 78.0–100.0)
Monocytes Absolute: 0.8 10*3/uL (ref 0.1–1.0)
Monocytes Relative: 8 % (ref 3–12)
Neutro Abs: 5.6 10*3/uL (ref 1.7–7.7)
Neutrophils Relative %: 54 % (ref 43–77)
Platelets: 364 10*3/uL (ref 150–400)
Potassium: 3.9 meq/L (ref 3.5–5.3)
RBC: 4.39 M/uL (ref 3.87–5.11)
RDW: 13.6 % (ref 11.5–15.5)
Saturation Ratios: 38 % (ref 20–55)
Sodium: 141 meq/L (ref 135–145)
TIBC: 272 ug/dL (ref 250–470)
Total Bilirubin: 0.3 mg/dL (ref 0.3–1.2)
Total CHOL/HDL Ratio: 2.9
Total Protein: 6.9 g/dL (ref 6.0–8.3)
Triglycerides: 90 mg/dL (ref <150)
UIBC: 170 ug/dL
VLDL: 18 mg/dL (ref 0–40)
WBC: 10.4 10*3/uL (ref 4.0–10.5)

## 2009-11-29 ENCOUNTER — Ambulatory Visit: Payer: Self-pay | Admitting: Critical Care Medicine

## 2010-01-01 ENCOUNTER — Telehealth: Payer: Self-pay | Admitting: Critical Care Medicine

## 2010-01-10 ENCOUNTER — Ambulatory Visit: Payer: Self-pay | Admitting: Critical Care Medicine

## 2010-01-16 ENCOUNTER — Encounter: Admission: RE | Admit: 2010-01-16 | Discharge: 2010-01-16 | Payer: Self-pay | Admitting: Thoracic Surgery

## 2010-01-16 ENCOUNTER — Ambulatory Visit: Payer: Self-pay | Admitting: Thoracic Surgery

## 2010-01-21 ENCOUNTER — Telehealth (INDEPENDENT_AMBULATORY_CARE_PROVIDER_SITE_OTHER): Payer: Self-pay | Admitting: *Deleted

## 2010-01-22 ENCOUNTER — Ambulatory Visit: Payer: Self-pay | Admitting: Family Medicine

## 2010-01-23 LAB — CONVERTED CEMR LAB: TSH: 2.334 microintl units/mL (ref 0.350–4.500)

## 2010-04-04 ENCOUNTER — Ambulatory Visit: Payer: Self-pay | Admitting: Critical Care Medicine

## 2010-04-24 ENCOUNTER — Ambulatory Visit: Payer: Self-pay | Admitting: Family Medicine

## 2010-07-03 ENCOUNTER — Encounter: Admission: RE | Admit: 2010-07-03 | Discharge: 2010-07-03 | Payer: Self-pay | Admitting: Thoracic Surgery

## 2010-07-03 ENCOUNTER — Ambulatory Visit: Payer: Self-pay | Admitting: Thoracic Surgery

## 2010-07-31 ENCOUNTER — Ambulatory Visit: Payer: Self-pay | Admitting: Family Medicine

## 2010-07-31 ENCOUNTER — Telehealth: Payer: Self-pay | Admitting: Family Medicine

## 2010-08-01 ENCOUNTER — Encounter: Payer: Self-pay | Admitting: Family Medicine

## 2010-08-01 LAB — CONVERTED CEMR LAB
Free T4: 1.25 ng/dL (ref 0.80–1.80)
T3, Free: 2.9 pg/mL (ref 2.3–4.2)
TSH: 2.113 microintl units/mL (ref 0.350–4.500)

## 2010-08-31 ENCOUNTER — Ambulatory Visit: Payer: Self-pay | Admitting: Family Medicine

## 2010-09-02 ENCOUNTER — Telehealth (INDEPENDENT_AMBULATORY_CARE_PROVIDER_SITE_OTHER): Payer: Self-pay | Admitting: *Deleted

## 2010-09-26 ENCOUNTER — Ambulatory Visit: Payer: Self-pay | Admitting: Family Medicine

## 2010-09-26 DIAGNOSIS — B370 Candidal stomatitis: Secondary | ICD-10-CM | POA: Insufficient documentation

## 2010-10-08 ENCOUNTER — Ambulatory Visit: Payer: Self-pay | Admitting: Pulmonary Disease

## 2010-10-08 ENCOUNTER — Ambulatory Visit (HOSPITAL_BASED_OUTPATIENT_CLINIC_OR_DEPARTMENT_OTHER)
Admission: RE | Admit: 2010-10-08 | Discharge: 2010-10-08 | Payer: Self-pay | Source: Home / Self Care | Attending: Pulmonary Disease | Admitting: Pulmonary Disease

## 2010-10-31 ENCOUNTER — Encounter: Payer: Self-pay | Admitting: Family Medicine

## 2010-10-31 ENCOUNTER — Ambulatory Visit
Admission: RE | Admit: 2010-10-31 | Discharge: 2010-10-31 | Payer: Self-pay | Source: Home / Self Care | Attending: Family Medicine | Admitting: Family Medicine

## 2010-11-06 ENCOUNTER — Encounter: Payer: Self-pay | Admitting: Family Medicine

## 2010-11-07 LAB — CONVERTED CEMR LAB
ALT: 23 units/L (ref 0–35)
AST: 27 units/L (ref 0–37)
Albumin: 4.2 g/dL (ref 3.5–5.2)
Alkaline Phosphatase: 90 units/L (ref 39–117)
BUN: 12 mg/dL (ref 6–23)
Basophils Absolute: 0 10*3/uL (ref 0.0–0.1)
Basophils Relative: 1 % (ref 0–1)
CO2: 30 meq/L (ref 19–32)
Calcium: 9.1 mg/dL (ref 8.4–10.5)
Chloride: 101 meq/L (ref 96–112)
Cholesterol: 198 mg/dL (ref 0–200)
Creatinine, Ser: 0.61 mg/dL (ref 0.40–1.20)
Eosinophils Absolute: 0.2 10*3/uL (ref 0.0–0.7)
Eosinophils Relative: 2 % (ref 0–5)
Glucose, Bld: 94 mg/dL (ref 70–99)
HCT: 39.4 % (ref 36.0–46.0)
HDL: 69 mg/dL (ref >39)
Hemoglobin: 13.2 g/dL (ref 12.0–15.0)
LDL Cholesterol: 113 mg/dL — ABNORMAL HIGH (ref 0–99)
Lymphocytes Relative: 25 % (ref 12–46)
Lymphs Abs: 2.1 10*3/uL (ref 0.7–4.0)
MCHC: 33.5 g/dL (ref 30.0–36.0)
MCV: 91.6 fL (ref 78.0–100.0)
Monocytes Absolute: 0.9 10*3/uL (ref 0.1–1.0)
Monocytes Relative: 10 % (ref 3–12)
Neutro Abs: 5.4 10*3/uL (ref 1.7–7.7)
Neutrophils Relative %: 63 % (ref 43–77)
Platelets: 320 10*3/uL (ref 150–400)
Potassium: 3.8 meq/L (ref 3.5–5.3)
RBC: 4.3 M/uL (ref 3.87–5.11)
RDW: 13.5 % (ref 11.5–15.5)
Sodium: 141 meq/L (ref 135–145)
Total Bilirubin: 0.4 mg/dL (ref 0.3–1.2)
Total CHOL/HDL Ratio: 2.9
Total Protein: 6.7 g/dL (ref 6.0–8.3)
Triglycerides: 82 mg/dL (ref <150)
VLDL: 16 mg/dL (ref 0–40)
WBC: 8.6 10*3/uL (ref 4.0–10.5)

## 2010-11-10 ENCOUNTER — Encounter: Payer: Self-pay | Admitting: Thoracic Surgery

## 2010-11-18 ENCOUNTER — Telehealth: Payer: Self-pay | Admitting: Critical Care Medicine

## 2010-11-19 NOTE — Progress Notes (Signed)
Summary: rx request  Phone Note Call from Patient Call back at Home Phone [phone]   Caller: Patient Call For: Richard Ritchey Summary of Call: pt requests that QVAR [facility] [address] [facility] [place]. pt says that it will be sometime before this is avail for her mailorder as it's on backorder.  Initial call taken by: Tivis Ringer, CNA,  January 01, 2010 8:51 AM  Follow-up for Phone Call        rx sent. pt advised. s.ign     Prescriptions: QVAR 40 MCG/ACT  AERS (BECLOMETHASONE DIPROPIONATE) Two puffs twice daily  #1 x 2   Entered by:   Carron Curie CMA   Authorized by:   Storm Frisk MD   Signed by:   Carron Curie CMA on 01/01/2010   Method used:   Electronically to        [facility] [phone]* (retail)       [address] [place], [place]  [address]       Ph: [phone]       Fax: [phone]   RxID:   [id number]

## 2010-11-19 NOTE — Assessment & Plan Note (Signed)
Summary: f/u mood/ thyroid   Vital Signs:  Patient profile:   65 year old female Height:      64 inches Weight:      150 pounds BMI:     25.84 O2 Sat:      97 % on Room air Temp:     98.9 degrees F Pulse rate:   73 / minute BP sitting:   125 / 64  (left arm) Cuff size:   regular  Vitals Entered By: Payton Spark CMA (July 31, 2010 11:39 AM)  O2 Flow:  Room air CC: F/U mood.   Primary Care Jacoby Ritsema:  Seymour Bars D.O.  CC:  F/U mood.Marland Kitchen  History of Present Illness: 65 yo WF presents for f/u hypothyroidism.  She is on levothyroxine.  Due for labs.  She is now on Citalopram 20 mg/ day.  Doing well on it but has continued to have stress in relation to her daughter.    Has had a cold x 1 wk with SOB and increased wheezing.  On Brovana and Qvar thru [place] and has been using her proventil 2 x a day this wk.  Clear sputum production with daytime cough.  Denies F/C.    Current Medications (verified): 1)  Proventil Hfa 108 (90 Base) Mcg/act Aers (Albuterol Sulfate) .... Inhale Two Puffs Four Times Daily As Needed Sob 2)  Aspirin 81 Mg Tbec (Aspirin) .... Take 1 Tablet By Mouth Once A Day 3)  Norvasc 5 Mg  Tabs (Amlodipine Besylate) .... Take 1 Tablet By Mouth Once A Day 4)  Centrum  Tabs (Multiple Vitamins-Minerals) .... Take 1 Tablet By Mouth Once A Day 5)  Seroquel 25 Mg Tabs (Quetiapine Fumarate) .Marland Kitchen.. 1 Tab By Mouth Qhs 6)  Albuterol Sulfate (2.5 Mg/32ml) 0.083%  Nebu (Albuterol Sulfate) .... Four Times Daily  As Needed 7)  Brovana 15 Mcg/56ml Nebu (Arformoterol Tartrate) .... 2 Ml Neb Bid 8)  Levothyroxine Sodium 25 Mcg Tabs (Levothyroxine Sodium) .Marland Kitchen.. 1 Tab By Mouth Daily 9)  Qvar 40 Mcg/act  Aers (Beclomethasone Dipropionate) .... Two Puffs Twice Daily 10)  Aerochamber Mv  Misc (Spacer/aero-Holding Chambers) .... Use With Qvar and Proventil Hfa 11)  Alprazolam 0.5 Mg Tabs (Alprazolam) .Marland Kitchen.. 1 Tab By Mouth Once Daily As Needed For Anxiety 12)  Citalopram Hydrobromide 20 Mg  Tabs (Citalopram Hydrobromide) .Marland Kitchen.. 1 Tab By Mouth Qhs 13)  Prilosec Otc 20 Mg Tbec (Omeprazole Magnesium) .... Take As Directed Daily As Needed  Allergies (verified): 1)  ! Penicillin G Potassium (Penicillin G Potassium) 2)  ! Lisinopril-Hydrochlorothiazide (Lisinopril-Hydrochlorothiazide)  Past History:  Past Medical History: Reviewed history from 11/29/2009 and no changes required. CAD-20% stenosis on cath-no intervention required, (W-S cards) neg nuclear stress test 11-07 Hurthle cell thyroid adenoma, (Benign 2007) Lung Cancer 2007 (Dr Shirline Frees and Dr Edwyna Shell) RLS seed implants LUL by Dr Kathrynn Running 7-07 Peripheral vascular dz iron deficiency anemia; s/p 2 Unit transfusion 2-08 HTN COPD Golds Stage III  FeV1 39% 9/10 colonoscopy normal 2-08 pap smear with Dr Patterson Hammers[facility] at [facility]  Past Surgical History: Reviewed history from 06/07/2009 and no changes required. appendectomy age 21,  Cardiac Cath,  cholecystectomy age 87,  LUL wedge resection/ VATS, 2005 partial thyroidectomy (Dr Edwyna Shell 2007) Peripheral leg stented LUL lobectomy for cystic cavity and Candida 4-08  No CA seen  Social History: Reviewed history from 01/08/2009 and no changes required. 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  Raises her 43 yo granddaughter (does  not have custody).  Quit smoking after 40 pk yrs.  Walks.    Review of Systems      See HPI  Physical Exam  General:  alert, well-developed, well-nourished, and well-hydrated.   Head:  normocephalic and atraumatic.   Eyes:  conjunctiva clear Nose:  no nasal discharge.   Mouth:  pharynx pink and moist.   Neck:  no masses.  no JVD Lungs:  nonlabored, rhonchi bibasilar, faint with exp wheezing.   Heart:  Heart rate and rhythm regular. No murmurs.  Extremities:  no LE edema Skin:  color normal.   Cervical Nodes:  No lymphadenopathy noted Psych:  good eye contact, not anxious appearing, and flat affect.     Impression &  Recommendations:  Problem # 1:  DEPRESSION, MAJOR, RECURRENT (ICD-296.30) PHQ 9 score of 8. Will increase her Citalopram from 20--> 40 mg/ day. Call if any problems. RTC in 3 mosl  Problem # 2:  CHRONIC OBSTRUCTIVE PULMONARY DISEASE, ACUTE EXACERBATION (ICD-491.21) URI inducing COPD flare up x 1 wk with increased SOB. Will treat her with Prednisone 60 mg/ day x 5 days and 5 days of Zithroamx in addition to continuing routine COPD meds. Call if any worsening of symptoms. Flu shot today.  Problem # 3:  HYPOTHYROIDISM, POSTSURGICAL (ICD-244.0)  Due for thyroid labs today.  Will call her w/ results.  Consider change to Armor for c/o 'hair loss'.   Her updated medication list for this problem includes:    Levothyroxine Sodium 25 Mcg Tabs (Levothyroxine sodium) .Marland Kitchen... 1 tab by mouth daily  Orders: T-T3, Free [phone]) T-T4, Free [phone]) T-TSH [phone])  Complete Medication List: 1)  Proventil Hfa 108 (90 Base) Mcg/act Aers (Albuterol sulfate) .... Inhale two puffs four times daily as needed sob 2)  Aspirin 81 Mg Tbec (Aspirin) .... Take 1 tablet by mouth once a day 3)  Norvasc 5 Mg Tabs (Amlodipine besylate) .... Take 1 tablet by mouth once a day 4)  Centrum Tabs (Multiple vitamins-minerals) .... Take 1 tablet by mouth once a day 5)  Seroquel 25 Mg Tabs (Quetiapine fumarate) .Marland Kitchen.. 1 tab by mouth qhs 6)  Albuterol Sulfate (2.5 Mg/76ml) 0.083% Nebu (Albuterol sulfate) .... Four times daily  as needed 7)  Brovana 15 Mcg/68ml Nebu (Arformoterol tartrate) .... 2 ml neb bid 8)  Levothyroxine Sodium 25 Mcg Tabs (Levothyroxine sodium) .Marland Kitchen.. 1 tab by mouth daily 9)  Qvar 40 Mcg/act Aers (Beclomethasone dipropionate) .... Two puffs twice daily 10)  Aerochamber Mv Misc (Spacer/aero-holding chambers) .... Use with qvar and proventil hfa 11)  Alprazolam 0.5 Mg Tabs (Alprazolam) .Marland Kitchen.. 1 tab by mouth once daily as needed for anxiety 12)  Citalopram Hydrobromide 40 Mg Tabs (Citalopram  hydrobromide) .Marland Kitchen.. 1 tab by mouth daily 13)  Prilosec Otc 20 Mg Tbec (Omeprazole magnesium) .... Take as directed daily as needed 14)  Prednisone 20 Mg Tabs (Prednisone) .... 3 tabs by mouth once daily x 5 days 15)  Zithromax Z-pak 250 Mg Tabs (Azithromycin) .... 2 tabs by mouth x 1 then 1 tab by mouth daily x 4 days  Other Orders: Flu Vaccine 72yrs + MEDICARE PATIENTS (X3244) Administration Flu vaccine - MCR (W1027)  Patient Instructions: 1)  Recheck thyroid labs today. 2)  Will call you w/ results tomorrow. 3)  Increase Citalopram to 40 mg once daily for mood. 4)  For COPD exacerbation, take Prednisone 60 mg (3 tabs) once daily x 5 days and 5 days of Zithromax. 5)  Stay on maintenance COPD  meds. 6)  Flu shot today. 7)  Call if Shortness of breathe not improved in 5-7 days. 8)  Return for f/u mood in 3 mos. Prescriptions: ZITHROMAX Z-PAK 250 MG TABS (AZITHROMYCIN) 2 tabs by mouth x 1 then 1 tab by mouth daily x 4 days  #1 pack x 0   Entered and Authorized by:   Seymour Bars DO   Signed by:   Seymour Bars DO on 07/31/2010   Method used:   Electronically to        [facility] [phone]* (retail)       [address] [place], [place]  [address]       Ph: [phone]       Fax: [phone]   RxID:   [id number] PREDNISONE 20 MG TABS (PREDNISONE) 3 tabs by mouth once daily x 5 days  #15 x 0   Entered and Authorized by:   Seymour Bars DO   Signed by:   Seymour Bars DO on 07/31/2010   Method used:   Electronically to        [facility] [phone]* (retail)       [address] [place], [place]  [address]       Ph: [phone]       Fax: [phone]   RxID:   [id number] CITALOPRAM HYDROBROMIDE 40 MG TABS (CITALOPRAM HYDROBROMIDE) 1 tab by mouth daily  #30 x 5   Entered and Authorized by:   Seymour Bars DO   Signed by:   Seymour Bars DO on 07/31/2010   Method used:   Electronically to        [facility] [phone]* (retail)       [address] [place],  [place]  [address]       Ph: [phone]       Fax: [phone]   RxID:   [id number]  Flu Vaccine Consent Questions     Do you have a history of severe allergic reactions to this vaccine? no    Any prior history of allergic reactions to egg and/or gelatin? no    Do you have a sensitivity to the preservative Thimersol? no    Do you have a past history of Guillan-Barre Syndrome? no    Do you currently have an acute febrile illness? no    Have you ever had a severe reaction to latex? no    Vaccine information given and explained to patient? yes    Are you currently pregnant? no    Lot Number:[id number]   Exp Date:04/19/2011   Site Given  Left Deltoid [id number]       Ph: [phone]       Fax: [phone]   RxID:   [id number]    .lbmedflu

## 2010-11-19 NOTE — Progress Notes (Signed)
Summary: rx clarification/ pharm calling  Phone Note From Pharmacy   Caller: Patient Call For: wright Caller: rex w/ [facility] Call For: wright  Summary of Call:  pharmacy calling-needs to verify that dr Delford Field wants pt to take "2 puffs per day"  instead of the standard 1 puff/ spiriva  [phone] x [phone] Initial call taken by: Tivis Ringer, CNA,  January 21, 2010 9:52 AM  Follow-up for Phone Call        called and spoke with Glenda from [facility] and informed her directions should state "inhale contens of 1 capsule once a day."  Glenda verbalized understanding.  Aundra Millet Reynolds LPN  January 22, 1307 10:46 AM     New/Updated Medications: SPIRIVA HANDIHALER 18 MCG  CAPS (TIOTROPIUM BROMIDE MONOHYDRATE) Inhale contents of 1 capsule once a day

## 2010-11-19 NOTE — Progress Notes (Signed)
Summary: Wants cough syrup  Phone Note Call from Patient   Caller: Patient Call For: Seymour Bars DO Summary of Call: Pt calls back and decided she would like to have a cough syrup [facility] to [facility] [place] before you leave today Initial call taken by: Kathlene November LPN,  July 31, 2010 4:16 PM    New/Updated Medications: HYDROCODONE-HOMATROPINE 5-1.5 MG/5ML SYRP (HYDROCODONE-HOMATROPINE) 5 ml by mouth at bedtime as needed cough Prescriptions: HYDROCODONE-HOMATROPINE 5-1.5 MG/5ML SYRP (HYDROCODONE-HOMATROPINE) 5 ml by mouth at bedtime as needed cough  #100 ml x 0   Entered and Authorized by:   Seymour Bars DO   Signed by:   Seymour Bars DO on 07/31/2010   Method used:   Printed then faxed to ...       [facility] [phone]* (retail)       [address] [place], [place]  [address]       Ph: [phone]       Fax: [phone]   RxID:   [id number]

## 2010-11-19 NOTE — Letter (Signed)
Summary: CVTS S/P Bronchoscopy Note  CVTS S/P Bronchoscopy Note   Imported By: Kathlene November 01/01/2007 11:28:03  _____________________________________________________________________  External Attachment:    Type:   Image     Comment:   External Document

## 2010-11-19 NOTE — Assessment & Plan Note (Signed)
Summary: Pulmonary OV   Primary Provider/Referring Provider:  Seymour Bars D.O.  CC:  Follow up after PFTs and .  states breathing is getting better and has improved a little.  started spiriva-seems to be helping but thinks it is causing a cough.  .  History of Present Illness: Pulmonary Consultation       This is a 65 year old woman who presents for COPD initial evaluation.  The patient complains of history of diagnosed COPD, shortness of breath, chest tightness, mucous production, and exercise induced symptoms, but denies chest pain worse with breathing and coughing, wheezing, cough, nocturnal awakening, and congestion.  Oxygen evaluation is described as not on supplemental O2.  Prior effective treatment has included nebulizer.    Pt with hx of Lung Ca dx 2005 and partial resection per Dr Edwyna Shell.  No hx of COPD until LUL partial resection.  Had XRT implants. Since 2005 dyspnea worse.  No wheeze.  Nebulizer helps.  No real cough or mucous.  Has heartburn.  There are not any alleviating or precipitating factors noted.  The symptoms do not generally fluctuate. Pt denies any significant sore throat, nasal congestion or excess secretions, fever, chills, sweats, unintended weight loss, pleurtic or exertional chest pain, orthopnea PND, or leg swelling Pt denies any increase in rescue therapy over baseline, denies waking up needing it or having any early am or nocturnal exacerbations of coughing/wheezing/or dyspnea.   August 02, 2009 9:29 AM Spiriva makes the pt cough.  There is sl amount of mucous.  Pt already had flu vaccine.   Pt denies any significant sore throat, nasal congestion or excess secretions, fever, chills, sweats, unintended weight loss, pleurtic or exertional chest pain, orthopnea PND, or leg swelling Pt denies any increase in rescue therapy over baseline, denies waking up needing it or having any early am or nocturnal exacerbations of coughing/wheezing/or dyspnea. There are not  any alleviating or precipitating factors noted.  The symptoms do not generally fluctuate.   Preventive Screening-Counseling & Management  Alcohol-Tobacco     Smoking Status: quit > 6 months     Year Quit: 2007  Current Medications (verified): 1)  Proventil Hfa 108 (90 Base) Mcg/act Aers (Albuterol Sulfate) .... Inhale Two Puffs Four Times Daily As Needed Sob 2)  Aspirin 81 Mg Tbec (Aspirin) .... Take 1 Tablet By Mouth Once A Day 3)  Norvasc 5 Mg  Tabs (Amlodipine Besylate) .... Take 1 Tablet By Mouth Once A Day 4)  Centrum  Tabs (Multiple Vitamins-Minerals) .... Take 1 Tablet By Mouth Once A Day 5)  Seroquel 25 Mg Tabs (Quetiapine Fumarate) .Marland Kitchen.. 1 Tab By Mouth Qhs 6)  Alprazolam 0.25 Mg Tabs (Alprazolam) .Marland Kitchen.. 1 Tab By Mouth Two Times A Day As Needed Anxiety 7)  Albuterol Sulfate (2.5 Mg/72ml) 0.083%  Nebu (Albuterol Sulfate) .... Four Times Daily or Every 6 Hours As Needed 8)  Brovana 15 Mcg/35ml Nebu (Arformoterol Tartrate) .... 2 Ml Neb Bid 9)  Tramadol Hcl 50 Mg Tabs (Tramadol Hcl) .Marland Kitchen.. 1-2 Tabs By Mouth Daily As Needed For Arthritis Pain 10)  Levothyroxine Sodium 25 Mcg Tabs (Levothyroxine Sodium) .Marland Kitchen.. 1 Tab By Mouth Daily 11)  Spiriva Handihaler 18 Mcg  Caps (Tiotropium Bromide Monohydrate) .... Two Puffs in Handihaler Daily  Allergies (verified): 1)  ! Penicillin G Potassium (Penicillin G Potassium) 2)  ! Lisinopril-Hydrochlorothiazide (Lisinopril-Hydrochlorothiazide)  Past History:  Past medical, surgical, family and social histories (including risk factors) reviewed, and no changes noted (except as noted below).  Past  Medical History: Reviewed history from 02/17/2007 and no changes required. CAD-20% stenosis on cath-no intervention required, (W-S cards) neg nuclear stress test 11-07 Hurthle cell thyroid adenoma, (Benign 2007) Lung Cancer 2007 (Dr Shirline Frees and Dr Edwyna Shell) RLS seed implants LUL by Dr Kathrynn Running 7-07 Peripheral vascular dz iron deficiency anemia; s/p 2 Unit  transfusion 2-08 HTN COPD colonoscopy normal 2-08 pap smear with Dr Patterson Hammersmith at [facility]  Past Surgical History: Reviewed history from 06/07/2009 and no changes required. appendectomy age 62,  Cardiac Cath,  cholecystectomy age 37,  LUL wedge resection/ VATS, 2005 partial thyroidectomy (Dr Edwyna Shell 2007) Peripheral leg stented LUL lobectomy for cystic cavity and Candida 4-08  No CA seen  Family History: Reviewed history from 02/17/2007 and no changes required. 2 brothers alive and healthy, father died of laryngeal cancer, had HTN,  mother died- oral. Larynx and endometrial cancer  sister alive- MS & DM, sister alive, breast cancer at 96  Social History: Reviewed history from 01/08/2009 and no changes required. 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  Raises her 21 yo granddaughter (does not have custody).  Quit smoking after 40 pk yrs.  Walks.  Smoking Status:  quit > 6 months  Review of Systems       The patient complains of shortness of breath with activity, non-productive cough, and indigestion.  The patient denies shortness of breath at rest, productive cough, coughing up blood, chest pain, irregular heartbeats, acid heartburn, loss of appetite, weight change, abdominal pain, difficulty swallowing, sore throat, tooth/dental problems, headaches, nasal congestion/difficulty breathing through nose, sneezing, itching, ear ache, anxiety, depression, hand/feet swelling, joint stiffness or pain, rash, change in color of mucus, and fever.    Vital Signs:  Patient profile:   65 year old female Height:      63.5 inches Weight:      148 pounds BMI:     25.90 O2 Sat:      94 % on Room air Temp:     98.3 degrees F oral Pulse rate:   91 / minute BP sitting:   126 / 64  (left arm) Cuff size:   regular  Vitals Entered By: Gweneth Dimitri RN (August 02, 2009 9:22 AM)  O2 Flow:  Room air CC: Follow up after PFTs and .  states breathing is getting better,  has improved a little.  started spiriva-seems to be helping but thinks it is causing a cough.   Comments Medications reviewed with patient Gweneth Dimitri RN  August 02, 2009 9:22 AM    Physical Exam  Additional Exam:  Gen: Pleasant, well-nourished, in no distress,  normal affect ENT: No lesions,  mouth clear,  oropharynx clear, no postnasal drip Neck: No JVD, no TMG, no carotid bruits Lungs: No use of accessory muscles, no dullness to percussion, distant BS  prolonged expir phase Cardiovascular: RRR, heart sounds normal, no murmur or gallops, no peripheral edema Abdomen: soft and NT, no HSM,  BS normal Musculoskeletal: No deformities, no cyanosis or clubbing Neuro: alert, non focal Skin: Warm, no lesions or rashes    Impression & Recommendations:  Problem # 1:  COPD (ICD-496) COPD stable at this time plan cont inhaled medications as prescribed  Complete Medication List: 1)  Proventil Hfa 108 (90 Base) Mcg/act Aers (Albuterol sulfate) .... Inhale two puffs four times daily as needed sob 2)  Aspirin 81 Mg Tbec (Aspirin) .... Take 1 tablet by mouth once a day 3)  Norvasc 5 Mg  Tabs (Amlodipine besylate) .... Take 1 tablet by mouth once a day 4)  Centrum Tabs (Multiple vitamins-minerals) .... Take 1 tablet by mouth once a day 5)  Seroquel 25 Mg Tabs (Quetiapine fumarate) .Marland Kitchen.. 1 tab by mouth qhs 6)  Alprazolam 0.25 Mg Tabs (Alprazolam) .Marland Kitchen.. 1 tab by mouth two times a day as needed anxiety 7)  Albuterol Sulfate (2.5 Mg/73ml) 0.083% Nebu (Albuterol sulfate) .... Four times daily or every 6 hours as needed 8)  Brovana 15 Mcg/46ml Nebu (Arformoterol tartrate) .... 2 ml neb bid 9)  Tramadol Hcl 50 Mg Tabs (Tramadol hcl) .Marland Kitchen.. 1-2 tabs by mouth daily as needed for arthritis pain 10)  Levothyroxine Sodium 25 Mcg Tabs (Levothyroxine sodium) .Marland Kitchen.. 1 tab by mouth daily 11)  Spiriva Handihaler 18 Mcg Caps (Tiotropium bromide monohydrate) .... Two puffs in handihaler daily  Other Orders: Est.  Patient Level III (04540)  Patient Instructions: 1)  No change in medications 2)  Return 4-5 months Prescriptions: BROVANA 15 MCG/2ML NEBU (ARFORMOTEROL TARTRATE) 2 ml neb BID  #60 x 6   Entered and Authorized by:   Storm Frisk MD   Signed by:   Storm Frisk MD on 08/02/2009   Method used:   Print then Give to Patient   RxID:   [id number] ALBUTEROL SULFATE (2.5 MG/3ML) 0.083%  NEBU (ALBUTEROL SULFATE) Four times daily or every 6 hours as needed  #120 x 6   Entered and Authorized by:   Storm Frisk MD   Signed by:   Storm Frisk MD on 08/02/2009   Method used:   Print then Give to Patient   RxID:   [id number]

## 2010-11-19 NOTE — Assessment & Plan Note (Signed)
Summary: thrush   Vital Signs:  Patient profile:   65 year old female Height:      64 inches Weight:      149 pounds BMI:     25.67 O2 Sat:      98.7 % on Room air Temp:     98.7 degrees F oral Pulse rate:   77 / minute BP sitting:   130 / 72  (left arm) Cuff size:   regular  Vitals Entered By: Payton Spark CMA (September 26, 2010 11:05 AM)  O2 Flow:  Room air CC: ? thrush x 1 week.   Primary Care Provider:  Seymour Bars D.O.  CC:  ? thrush x 1 week.Marland Kitchen  History of Present Illness: 65 yo WF on an inhaled steroid for COPD presents for white spots in the back of her throat, burning with swallowing and upper chest discomfort with dyspepsia x 1-2 wks..  She was just started on a PPI by UC.  She o/w feels well.  Current Medications (verified): 1)  Proventil Hfa 108 (90 Base) Mcg/act Aers (Albuterol Sulfate) .... Inhale Two Puffs Four Times Daily As Needed Sob 2)  Aspirin 81 Mg Tbec (Aspirin) .... Take 1 Tablet By Mouth Once A Day 3)  Norvasc 5 Mg  Tabs (Amlodipine Besylate) .... Take 1 Tablet By Mouth Once A Day 4)  Centrum  Tabs (Multiple Vitamins-Minerals) .... Take 1 Tablet By Mouth Once A Day 5)  Seroquel 25 Mg Tabs (Quetiapine Fumarate) .Marland Kitchen.. 1 Tab By Mouth Qhs 6)  Albuterol Sulfate (2.5 Mg/24ml) 0.083%  Nebu (Albuterol Sulfate) .... Four Times Daily  As Needed 7)  Brovana 15 Mcg/20ml Nebu (Arformoterol Tartrate) .... 2 Ml Neb Bid 8)  Levothyroxine Sodium 25 Mcg Tabs (Levothyroxine Sodium) .Marland Kitchen.. 1 Tab By Mouth Daily 9)  Qvar 40 Mcg/act  Aers (Beclomethasone Dipropionate) .... Two Puffs Twice Daily 10)  Aerochamber Mv  Misc (Spacer/aero-Holding Chambers) .... Use With Qvar and Proventil Hfa 11)  Alprazolam 0.5 Mg Tabs (Alprazolam) .Marland Kitchen.. 1 Tab By Mouth Once Daily As Needed For Anxiety 12)  Citalopram Hydrobromide 40 Mg Tabs (Citalopram Hydrobromide) .Marland Kitchen.. 1 Tab By Mouth Daily 13)  Prilosec Otc 20 Mg Tbec (Omeprazole Magnesium) .... Take As Directed Daily As Needed 14)  Prednisone 20  Mg Tabs (Prednisone) .... 3 Tabs By Mouth Once Daily X 5 Days 15)  Zithromax Z-Pak 250 Mg Tabs (Azithromycin) .... 2 Tabs By Mouth X 1 Then 1 Tab By Mouth Daily X 4 Days 16)  Hydrocodone-Homatropine 5-1.5 Mg/70ml Syrp (Hydrocodone-Homatropine) .... 5 Ml By Mouth At Bedtime As Needed Cough 17)  Omeprazole 20 Mg Cpdr (Omeprazole) .Marland Kitchen.. 1 By Mouth Qhs  Allergies (verified): 1)  ! Penicillin G Potassium (Penicillin G Potassium) 2)  ! Lisinopril-Hydrochlorothiazide (Lisinopril-Hydrochlorothiazide)  Past History:  Past Medical History: Reviewed history from 11/29/2009 and no changes required. CAD-20% stenosis on cath-no intervention required, (W-S cards) neg nuclear stress test 11-07 Hurthle cell thyroid adenoma, (Benign 2007) Lung Cancer 2007 (Dr Shirline Frees and Dr Edwyna Shell) RLS seed implants LUL by Dr Kathrynn Running 7-07 Peripheral vascular dz iron deficiency anemia; s/p 2 Unit transfusion 2-08 HTN COPD Golds Stage III  FeV1 39% 9/10 colonoscopy normal 2-08 pap smear with Dr Patterson Hammersmith at [facility]  Social History: Reviewed history from 01/08/2009 and no changes required. 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  Raises her 53 yo granddaughter (does not have custody).  Quit smoking after 40 pk yrs.  Walks.  Review of Systems      See HPI  Physical Exam  General:  alert, well-developed, well-nourished, and well-hydrated.   Mouth:  white patches over the posterior hard palate and visible in the oropharnyx without injection.  able to swallow w/o problem Neck:  tender, slightly enlarged cervical LNs Skin:  color normal and no rashes.     Impression & Recommendations:  Problem # 1:  THRUSH (ICD-112.0) Oral and likely esophageal thrush causing odynophagia and dyspepsia.  Will treat with Nystatin Swish and Swallow 4 x a day; to completed until 2 days after resolution of white patches.  She is goin g to get new tubing for her neb machine and will remembjer to rinse after  each use of Qvar inhaler.  If dyspepsia resolves, can stop her PPI.  Complete Medication List: 1)  Proventil Hfa 108 (90 Base) Mcg/act Aers (Albuterol sulfate) .... Inhale two puffs four times daily as needed sob 2)  Aspirin 81 Mg Tbec (Aspirin) .... Take 1 tablet by mouth once a day 3)  Norvasc 5 Mg Tabs (Amlodipine besylate) .... Take 1 tablet by mouth once a day 4)  Centrum Tabs (Multiple vitamins-minerals) .... Take 1 tablet by mouth once a day 5)  Seroquel 25 Mg Tabs (Quetiapine fumarate) .Marland Kitchen.. 1 tab by mouth qhs 6)  Albuterol Sulfate (2.5 Mg/42ml) 0.083% Nebu (Albuterol sulfate) .... Four times daily  as needed 7)  Brovana 15 Mcg/57ml Nebu (Arformoterol tartrate) .... 2 ml neb bid 8)  Levothyroxine Sodium 25 Mcg Tabs (Levothyroxine sodium) .Marland Kitchen.. 1 tab by mouth daily 9)  Qvar 40 Mcg/act Aers (Beclomethasone dipropionate) .... Two puffs twice daily 10)  Aerochamber Mv Misc (Spacer/aero-holding chambers) .... Use with qvar and proventil hfa 11)  Alprazolam 0.5 Mg Tabs (Alprazolam) .Marland Kitchen.. 1 tab by mouth once daily as needed for anxiety 12)  Citalopram Hydrobromide 40 Mg Tabs (Citalopram hydrobromide) .Marland Kitchen.. 1 tab by mouth daily 13)  Prilosec Otc 20 Mg Tbec (Omeprazole magnesium) .... Take as directed daily as needed 14)  Prednisone 20 Mg Tabs (Prednisone) .... 3 tabs by mouth once daily x 5 days 15)  Zithromax Z-pak 250 Mg Tabs (Azithromycin) .... 2 tabs by mouth x 1 then 1 tab by mouth daily x 4 days 16)  Hydrocodone-homatropine 5-1.5 Mg/70ml Syrp (Hydrocodone-homatropine) .... 5 ml by mouth at bedtime as needed cough 17)  Omeprazole 20 Mg Cpdr (Omeprazole) .Marland Kitchen.. 1 by mouth qhs 18)  Nystatin 100000 Unit/ml Susp (Nystatin) .... 5 ml by mouth swish and swallow 4 x a day x 2 wks.  Patient Instructions: 1)  Treat thrush with Nystatin swish and swallow 4 x a day.  Used until 2 days after white spots/ symptoms resolve. 2)  Get new tubing for neb machine and rinse mouth after each use of  Qvar. Prescriptions: NYSTATIN 100000 UNIT/ML SUSP (NYSTATIN) 5 ml by mouth swish and swallow 4 x a day x 2 wks.  #280 ml x 0   Entered and Authorized by:   Seymour Bars DO   Signed by:   Seymour Bars DO on 09/26/2010   Method used:   Electronically to        [facility] [phone]* (retail)       [address] [place], [place]  [address]       Ph: [phone]       Fax: [phone]   RxID:   [id number]    Orders Added: 1)  Est.  Patient Level III [id number]

## 2010-11-19 NOTE — Assessment & Plan Note (Signed)
Summary: f/u BP/ mood/ thyroid   Vital Signs:  Patient profile:   65 year old female Height:      64 inches Weight:      148 pounds BMI:     25.50 O2 Sat:      97 % on Room air Pulse rate:   73 / minute BP sitting:   118 / 70  (left arm) Cuff size:   regular  Vitals Entered By: Payton Spark CMA (January 22, 2010 9:07 AM)  O2 Flow:  Room air CC: F/U HTN and thyroid, Hypertension Management   Primary Care Provider:  Seymour Bars D.O.  CC:  F/U HTN and thyroid and Hypertension Management.  History of Present Illness: 58 yr WF presents for f/u.   She is due for TSH today.    She c/o dry cough and itchy, watery eyes. She denies any SOB, wheezing, or chest tightness. She has slight SOB w/ exertion. Uses rescue inhaler <1x/week. Able to sleep through the night.   Has improved with treatment of reflux and Qvar. Dr. Delford Field manages her COPD, last seen 01/10/2010, next f/u in 4 weeks.   She c/o frequent burning in her stomach d/t reflux, this has improved since starting omeprazole in March. Symptoms worsened by spicy foods.   She wants to restart her Xanax for 'moodiness'.  Under stress at home.  Feels this way all the time.        Hypertension History:      She denies headache, chest pain, palpitations, dyspnea with exertion, peripheral edema, visual symptoms, and side effects from treatment.  She notes no problems with any antihypertensive medication side effects.        Positive major cardiovascular risk factors include female age 11 years old or older, hyperlipidemia, and hypertension.  Negative major cardiovascular risk factors include no history of diabetes, negative family history for ischemic heart disease, and non-tobacco-user status.        Positive history for target organ damage include ASHD (either angina/prior MI/prior CABG) and peripheral vascular disease.  Further assessment for target organ damage reveals no history of cardiac end-organ damage (CHF/LVH), stroke/TIA, renal  insufficiency, or hypertensive retinopathy.     Habits & Providers  Alcohol-Tobacco-Diet     Tobacco Status: quit  Current Medications (verified): 1)  Proventil Hfa 108 (90 Base) Mcg/act Aers (Albuterol Sulfate) .... Inhale Two Puffs Four Times Daily As Needed Sob 2)  Aspirin 81 Mg Tbec (Aspirin) .... Take 1 Tablet By Mouth Once A Day 3)  Norvasc 5 Mg  Tabs (Amlodipine Besylate) .... Take 1 Tablet By Mouth Once A Day 4)  Centrum  Tabs (Multiple Vitamins-Minerals) .... Take 1 Tablet By Mouth Once A Day 5)  Seroquel 25 Mg Tabs (Quetiapine Fumarate) .Marland Kitchen.. 1 Tab By Mouth Qhs 6)  Albuterol Sulfate (2.5 Mg/16ml) 0.083%  Nebu (Albuterol Sulfate) .... Four Times Daily  As Needed 7)  Brovana 15 Mcg/65ml Nebu (Arformoterol Tartrate) .... 2 Ml Neb Bid 8)  Levothyroxine Sodium 25 Mcg Tabs (Levothyroxine Sodium) .Marland Kitchen.. 1 Tab By Mouth Daily 9)  Spiriva Handihaler 18 Mcg  Caps (Tiotropium Bromide Monohydrate) .... Inhale Contents of 1 Capsule Once A Day 10)  Qvar 40 Mcg/act  Aers (Beclomethasone Dipropionate) .... Two Puffs Twice Daily 11)  Aerochamber Mv  Misc (Spacer/aero-Holding Chambers) .... Use With Qvar and Proventil Hfa 12)  Omeprazole 20 Mg  Cpdr (Omeprazole) .... By Mouth Daily. Take One Half Hour Before Eating.  Allergies (verified): 1)  ! Penicillin G  Potassium (Penicillin G Potassium) 2)  ! Lisinopril-Hydrochlorothiazide (Lisinopril-Hydrochlorothiazide)  Past History:  Past Medical History: Reviewed history from 11/29/2009 and no changes required. CAD-20% stenosis on cath-no intervention required, (W-S cards) neg nuclear stress test 11-07 Hurthle cell thyroid adenoma, (Benign 2007) Lung Cancer 2007 (Dr Shirline Frees and Dr Edwyna Shell) RLS seed implants LUL by Dr Kathrynn Running 7-07 Peripheral vascular dz iron deficiency anemia; s/p 2 Unit transfusion 2-08 HTN COPD Golds Stage III  FeV1 39% 9/10 colonoscopy normal 2-08 pap smear with Dr Patterson Hammersmith at [facility]  Past Surgical History: Reviewed history  from 06/07/2009 and no changes required. appendectomy age 73,  Cardiac Cath,  cholecystectomy age 2,  LUL wedge resection/ VATS, 2005 partial thyroidectomy (Dr Edwyna Shell 2007) Peripheral leg stented LUL lobectomy for cystic cavity and Candida 4-08  No CA seen  Family History: Reviewed history from 02/17/2007 and no changes required. 2 brothers alive and healthy, father died of laryngeal cancer, had HTN,  mother died- oral. Larynx and endometrial cancer  sister alive- MS & DM, sister alive, breast cancer at 14  Social History: Reviewed history from 01/08/2009 and no changes required. 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  Raises her 62 yo granddaughter (does not have custody).  Quit smoking after 40 pk yrs.  Walks.  Smoking Status:  quit  Review of Systems      See HPI  Physical Exam  General:  alert, well-developed, well-nourished, and well-hydrated.   Head:  normocephalic, atraumatic, and no abnormalities observed.   Eyes:  conjunctiva clear; wearing glasses Mouth:  pharynx pink and moist.   Neck:  supple and full ROM.   Lungs:  nonlabored.  expiratory wheezes, faint auscultated R side only.  No rhonchi.  .  normal respiratory effort, no accessory muscle use, and R wheezes.   Heart:  Heart rate and rhythm regular. No murmurs.  Abdomen:  soft, non-tender, normal bowel sounds, no distention, no masses, and no guarding.   Pulses:  R radial normal and L radial normal.   Extremities:  no LE edema Skin:  color normal.   Psych:  normally interactive, good eye contact, and flat affect.     Impression & Recommendations:  Problem # 1:  DEPRESSION, MAJOR, RECURRENT (ICD-296.30)  Started Citalopram, 10mg  daily for week, then 20mg  daily, for mood. Prescription for Xanax, as needed, for severe anxiety.  F/U in 3mos.   Orders: Prescription Created Electronically [phone])  Problem # 2:  HYPOTHYROIDISM, POSTSURGICAL (ICD-244.0)  TSH today. Will call  with results tomorrow. Will refill Levothyroxine as appropriate tomorrow based on TSH level.   Her updated medication list for this problem includes:    Levothyroxine Sodium 25 Mcg Tabs (Levothyroxine sodium) .Marland Kitchen... 1 tab by mouth daily  Orders: T-TSH ([id number])  Problem # 3:  HYPERTENSION, BENIGN SYSTEMIC (ICD-401.1) BP looks great.  RFd Norvasc.  Labs are UTD.   BP today: 118/70 Prior BP: 114/62 (01/10/2010)  Prior 10 Yr Risk Heart Disease: N/A (02/19/2009)  Labs Reviewed: K+: 3.9 (09/21/2009) Creat: : 0.52 (09/21/2009)   Chol: 196 (09/21/2009)   HDL: 68 (09/21/2009)   LDL: 110 (09/21/2009)   TG: 90 (09/21/2009)  Problem # 4:  COPD (ICD-496) Stable after adding Qvar and treating her reflux per Dr Delford Field. Her updated medication list for this problem includes:    Proventil Hfa 108 (90 Base) Mcg/act Aers (Albuterol sulfate) ..... Inhale two puffs four times daily as needed sob    Albuterol Sulfate (2.5 Mg/66ml) 0.083%  Nebu (Albuterol sulfate) .Marland Kitchen... Four times daily  as needed    Brovana 15 Mcg/105ml Nebu (Arformoterol tartrate) .Marland Kitchen... 2 ml neb bid    Spiriva Handihaler 18 Mcg Caps (Tiotropium bromide monohydrate) ..... Inhale contents of 1 capsule once a day    Qvar 40 Mcg/act Aers (Beclomethasone dipropionate) .Marland Kitchen..Marland Kitchen Two puffs twice daily  Problem # 5:  HYPERTENSION, BENIGN SYSTEMIC (ICD-401.1) BP looks great, 118/70. Labs UTD.  Her updated medication list for this problem includes:    Norvasc 5 Mg Tabs (Amlodipine besylate) .Marland Kitchen... Take 1 tablet by mouth once a day  Complete Medication List: 1)  Proventil Hfa 108 (90 Base) Mcg/act Aers (Albuterol sulfate) .... Inhale two puffs four times daily as needed sob 2)  Aspirin 81 Mg Tbec (Aspirin) .... Take 1 tablet by mouth once a day 3)  Norvasc 5 Mg Tabs (Amlodipine besylate) .... Take 1 tablet by mouth once a day 4)  Centrum Tabs (Multiple vitamins-minerals) .... Take 1 tablet by mouth once a day 5)  Seroquel 25 Mg Tabs (Quetiapine  fumarate) .Marland Kitchen.. 1 tab by mouth qhs 6)  Albuterol Sulfate (2.5 Mg/76ml) 0.083% Nebu (Albuterol sulfate) .... Four times daily  as needed 7)  Brovana 15 Mcg/78ml Nebu (Arformoterol tartrate) .... 2 ml neb bid 8)  Levothyroxine Sodium 25 Mcg Tabs (Levothyroxine sodium) .Marland Kitchen.. 1 tab by mouth daily 9)  Spiriva Handihaler 18 Mcg Caps (Tiotropium bromide monohydrate) .... Inhale contents of 1 capsule once a day 10)  Qvar 40 Mcg/act Aers (Beclomethasone dipropionate) .... Two puffs twice daily 11)  Aerochamber Mv Misc (Spacer/aero-holding chambers) .... Use with qvar and proventil hfa 12)  Omeprazole 20 Mg Cpdr (Omeprazole) .... By mouth daily. take one half hour before eating. 13)  Alprazolam 0.5 Mg Tabs (Alprazolam) .Marland Kitchen.. 1 tab by mouth once daily as needed for anxiety 14)  Citalopram Hydrobromide 10 Mg Tabs (Citalopram hydrobromide) .Marland Kitchen.. 1 tab by mouth at bedtime x 1 wk then 2 tabs by mouth qhs  Hypertension Assessment/Plan:      The patient's hypertensive risk group is category C: Target organ damage and/or diabetes.  Today's blood pressure is 118/70.  Her blood pressure goal is < 140/90.  Patient Instructions: 1)  TSH today. 2)  Will call you w/ results tomorrow and RF appropriate levothyroxine dose. 3)  Start Citalopram in the evenings for mood.  Start with 10 mg the first wk then go up to 20 mg. 4)  Use Alprazolam AS NEEDED for severe anxiety. 5)  BP looks great.   6)  F/U for mood in 3 mos. Prescriptions: ALPRAZOLAM 0.5 MG TABS (ALPRAZOLAM) 1 tab by mouth once daily as needed for anxiety  #90 x 0   Entered and Authorized by:   Seymour Bars DO   Signed by:   Seymour Bars DO on 01/22/2010   Method used:   Printed then faxed to ...       [facility] [phone]* (retail)       [address] [place], [place]  [address]       Ph: [phone]       Fax: [phone]   RxID:   [id number] ALPRAZOLAM 0.5 MG TABS (ALPRAZOLAM) 1 tab by mouth once daily as needed for anxiety  #30 x 0   Entered  and Authorized by:   Seymour Bars DO   Signed by:   Seymour Bars DO on 01/22/2010   Method used:   Printed then  faxed to ...       [facility] [phone]* (retail)       [address] [place], [place]  [address]       Ph: [phone]       Fax: [phone]   RxID:   [id number] CITALOPRAM HYDROBROMIDE 10 MG TABS (CITALOPRAM HYDROBROMIDE) 1 tab by mouth at bedtime x 1 wk then 2 tabs by mouth qhs  #60 x 2   Entered and Authorized by:   Seymour Bars DO   Signed by:   Seymour Bars DO on 01/22/2010   Method used:   Electronically to        [facility] [phone]* (retail)       [address] [place], [place]  [address]       Ph: [phone]       Fax: [phone]   RxID:   [id number] NORVASC 5 MG  TABS (AMLODIPINE BESYLATE) Take 1 tablet by mouth once a day  #30 x 0   Entered and Authorized by:   Seymour Bars DO   Signed by:   Seymour Bars DO on 01/22/2010   Method used:   Electronically to        [facility] [phone]* (retail)       [address] [place], [place]  [address]       Ph: [phone]       Fax: [phone]   RxID:   [id number] NORVASC 5 MG  TABS (AMLODIPINE BESYLATE) Take 1 tablet by mouth once a day  #90 x 3   Entered and Authorized by:   Seymour Bars DO   Signed by:   Seymour Bars DO on 01/22/2010   Method used:   Printed then faxed to ...       [facility] [phone]* (retail)       [address] [place], [place]  [address]       Ph: [phone]       Fax: [phone]   RxID:   [id number] SEROQUEL 25 MG TABS (QUETIAPINE FUMARATE) 1 tab by mouth qhs  #90 x 3   Entered and Authorized by:   Seymour Bars DO   Signed by:   Seymour Bars DO on 01/22/2010   Method used:   Printed then faxed to ...       [facility] [phone]* (retail)       [address] [place], [place]  [address]       Ph: [phone]       Fax: [phone]   RxID:   [id number]

## 2010-11-19 NOTE — Assessment & Plan Note (Signed)
Summary: 6 min walk  Nurse Visit   Vital Signs:  Patient profile:   65 year old female Pulse rate:   74 / minute BP sitting:   106 / 60  Allergies: 1)  ! Penicillin G Potassium (Penicillin G Potassium) 2)  ! Lisinopril-Hydrochlorothiazide (Lisinopril-Hydrochlorothiazide)  Orders Added: 1)  Pulmonary Stress (6 min walk) [[address]4620]   Six Minute Walk Test Medications taken before test(dose and time): asa 81mg  @ 0700, norvasc 5mg  @ 0900.  Lap counter(place a tick mark inside a square for each lap completed) lap 1 complete  lap 2 complete   lap 3 complete   lap 4 complete  lap 5 complete  lap 6 complete  lap 7 complete   lap 8 complete   lap 9 complete   Baseline  BP sitting: 106/ 60 Heart rate: 74 Dyspnea ( Borg scale) 0 Fatigue (Borg scale) 0 SPO2 97 ra  End Of Test  BP sitting: 156/ 74 Heart rate: 112 Dyspnea ( Borg scale) 3 Fatigue (Borg scale) 1 SPO2 97 ra  2 Minutes post  BP sitting: 122/ 66 Heart rate: 81 SPO2 97 ra  Stopped or paused before six minutes? No  Interpretation: Number of laps  9 X 48 meters =   432 meters+ final partial lap: 21 meters =    453 meters   Total distance walked in six minutes: 453 meters  Tech ID: Boone Master CNA (June 26, 2009 11:58 AM) Jeremy Johann Comments pt completed test with no rest breaks and complaints of lower back discomfort which she states is normal with exertion.

## 2010-11-19 NOTE — Progress Notes (Signed)
Summary: [facility] apt  Phone Note Outgoing Call   Call placed by: Payton Spark CMA Call placed to: Patient Summary of Call: Called Pt to inform her that I scheduled apt at [facility]. Dr. Karrie Meres 10/09/09 @ 8:45am.  Initial call taken by: Payton Spark CMA,  September 20, 2009 2:17 PM

## 2010-11-19 NOTE — Assessment & Plan Note (Signed)
Summary: Pulmonary OV   Primary Provider/Referring Provider:  Seymour Bars D.O.  CC:  6 week COPD follow up.  states breathing is doing better since starting qvar.  states she does have dry cough but coughs very little.  denies wheezing and chest tigthness.  Requesting rx for spiriva-would like hard copy to mail into pharm.  History of Present Illness: Pulmonary Consultation       This is a 64 year old woman with COPD primary emphysema Golds Stage III  FeV1 39%    Pt with hx of Lung Ca dx 2005 and partial resection per Dr Edwyna Shell.  No hx of COPD until LUL partial resection.  Had XRT implants. Since 2005 dyspnea worse.  No wheeze.  Nebulizer helps.  No real cough or mucous.  Has heartburn.  There are not any alleviating or precipitating factors noted.  The symptoms do not generally fluctuate. Pt denies any significant sore throat, nasal congestion or excess secretions, fever, chills, sweats, unintended weight loss, pleurtic or exertional chest pain, orthopnea PND, or leg swelling Pt denies any increase in rescue therapy over baseline, denies waking up needing it or having any early am or nocturnal exacerbations of coughing/wheezing/or dyspnea.   November 29, 2009 11:01 AM Notes more dyspnea x 2weeks.   Mucous is white but volume is no change in volume of mucous.  If gets upset will get dyspneic.  Notes more wheezing. Cold air is an issue.  Pt denies any significant sore throat, nasal congestion or excess secretions, fever, chills, sweats, unintended weight loss, pleurtic or exertional chest pain, orthopnea PND, or leg swelling Pt denies any increase in rescue therapy over baseline, denies waking up needing it or having any early am or nocturnal exacerbations of coughing/wheezing/or dyspnea. January 10, 2010 10:43 AM At last ov we rec: Start Qvar  two puff twice daily , use aerochamber Stay on Spiriva and brovana Use albuterol in nebulizer and Proventil as needed only, not on schedule Take  prednisone 10mg  4 each am x3days, 3 x 3days, 2 x 3days, 1 x 3days then stop  The pt is better now and is on qvar and has helped There is no real productive cough, just dry cough,  no chest pain,  no wheeze now .   Preventive Screening-Counseling & Management  Alcohol-Tobacco     Smoking Status: quit > 6 months  Current Medications (verified): 1)  Proventil Hfa 108 (90 Base) Mcg/act Aers (Albuterol Sulfate) .... Inhale Two Puffs Four Times Daily As Needed Sob 2)  Aspirin 81 Mg Tbec (Aspirin) .... Take 1 Tablet By Mouth Once A Day 3)  Norvasc 5 Mg  Tabs (Amlodipine Besylate) .... Take 1 Tablet By Mouth Once A Day 4)  Centrum  Tabs (Multiple Vitamins-Minerals) .... Take 1 Tablet By Mouth Once A Day 5)  Seroquel 25 Mg Tabs (Quetiapine Fumarate) .Marland Kitchen.. 1 Tab By Mouth Qhs 6)  Albuterol Sulfate (2.5 Mg/53ml) 0.083%  Nebu (Albuterol Sulfate) .... Four Times Daily  As Needed 7)  Brovana 15 Mcg/30ml Nebu (Arformoterol Tartrate) .... 2 Ml Neb Bid 8)  Levothyroxine Sodium 25 Mcg Tabs (Levothyroxine Sodium) .Marland Kitchen.. 1 Tab By Mouth Daily 9)  Spiriva Handihaler 18 Mcg  Caps (Tiotropium Bromide Monohydrate) .... Two Puffs in Handihaler Daily 10)  Qvar 40 Mcg/act  Aers (Beclomethasone Dipropionate) .... Two Puffs Twice Daily 11)  Aerochamber Mv  Misc (Spacer/aero-Holding Chambers) .... Use With Qvar and Proventil Hfa  Allergies (verified): 1)  ! Penicillin G Potassium (Penicillin G Potassium) 2)  !  Lisinopril-Hydrochlorothiazide (Lisinopril-Hydrochlorothiazide)  Past History:  Past medical, surgical, family and social histories (including risk factors) reviewed, and no changes noted (except as noted below).  Past Medical History: Reviewed history from 11/29/2009 and no changes required. CAD-20% stenosis on cath-no intervention required, (W-S cards) neg nuclear stress test 11-07 Hurthle cell thyroid adenoma, (Benign 2007) Lung Cancer 2007 (Dr Shirline Frees and Dr Edwyna Shell) RLS seed implants LUL by Dr Kathrynn Running  7-07 Peripheral vascular dz iron deficiency anemia; s/p 2 Unit transfusion 2-08 HTN COPD Golds Stage III  FeV1 39% 9/10 colonoscopy normal 2-08 pap smear with Dr Patterson Hammersmith at [facility]  Past Surgical History: Reviewed history from 06/07/2009 and no changes required. appendectomy age 11,  Cardiac Cath,  cholecystectomy age 68,  LUL wedge resection/ VATS, 2005 partial thyroidectomy (Dr Edwyna Shell 2007) Peripheral leg stented LUL lobectomy for cystic cavity and Candida 4-08  No CA seen  Past Pulmonary History:  Pulmonary History: Pulmonary Function Test  Date: 06/26/2009 Height (in.): 64 Gender: Female  Pre-Spirometry  FVC     Value: 2.07 L/min   Pred: 3.02 L/min     % Pred: 68 % FEV1     Value: 0.86 L     Pred: 2.21 L     % Pred: 39 % FEV1/FVC   Value: 42 %     Pred: 73 %     FEF 25-75   Value: 0.34 L/min   Pred: 2.53 L/min     % Pred: 13 %  Post-Spirometry  FVC     Value: 2.22 L/min   Pred: 3.02 L/min     % Pred: 74 % FEV1     Value: 0.77 L     Pred: 2.21 L     % Pred: 35 % FEV1/FVC   Value: 35 %     Pred: 73 %     FEF 25-75   Value: 0.27 L/min   Pred: 2.53 L/min     % Pred: 11 %  Lung Volumes  TLC     Value: 2.91 L   % Pred: 60 % RV     Value: 0.69 L   % Pred: 37 % DLCO     Value: 10.9 %   % Pred: 55 % DLCO/VA   Value: 3.79 %   % Pred: 102 %  Comments:  Severe airflow obstruction without much change with bronchodilators,  moderate reduction in lung volume consistent with prior lung resection, moderate reduction in DLCO   Evaluation:  severe obstruction with NO significant bronchodilator response  Family History: Reviewed history from 02/17/2007 and no changes required. 2 brothers alive and healthy, father died of laryngeal cancer, had HTN,  mother died- oral. Larynx and endometrial cancer  sister alive- MS & DM, sister alive, breast cancer at 43  Social History: Reviewed history from 01/08/2009 and no changes required. 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  Married and  has 4 grown children.  Raises her 14 yo granddaughter (does not have custody).  Quit smoking after 40 pk yrs.  Walks.    Review of Systems       The patient complains of shortness of breath with activity, acid heartburn, and indigestion.  The patient denies shortness of breath at rest, productive cough, non-productive cough, coughing up blood, chest pain, irregular heartbeats, loss of appetite, weight change, abdominal pain, difficulty swallowing, sore throat, tooth/dental problems, headaches, nasal congestion/difficulty breathing through nose, sneezing, itching, ear ache, anxiety, depression, hand/feet swelling, joint stiffness or pain, rash, change in color  of mucus, and fever.    Vital Signs:  Patient profile:   65 year old female Height:      64 inches Weight:      148 pounds BMI:     25.50 O2 Sat:      96 % on Room air Temp:     98.1 degrees F oral Pulse rate:   84 / minute BP sitting:   114 / 62  (left arm) Cuff size:   regular  Vitals Entered By: Gweneth Dimitri RN (January 10, 2010 10:24 AM)  O2 Flow:  Room air CC: 6 week COPD follow up.  states breathing is doing better since starting qvar.  states she does have dry cough but coughs very little.  denies wheezing and chest tigthness.  Requesting rx for spiriva-would like hard copy to mail into pharm Comments Medications reviewed with patient Daytime contact number verified with patient. Gweneth Dimitri RN  January 10, 2010 10:25 AM    Physical Exam  Additional Exam:  Gen: Pleasant, well-nourished, in no distress,  normal affect ENT: No lesions,  mouth clear,  oropharynx clear, no postnasal drip Neck: No JVD, no TMG, no carotid bruits Lungs: No use of accessory muscles, no dullness to percussion, distant BS  prolonged expir phase Cardiovascular: RRR, heart sounds normal, no murmur or gallops, no peripheral edema Abdomen: soft and NT, no HSM,  BS normal Musculoskeletal: No deformities, no cyanosis or clubbing Neuro: alert, non  focal Skin: Warm, no lesions or rashes    Impression & Recommendations:  Problem # 1:  COPD (ICD-496) Assessment Improved  COPD stable at this time plan cont inhaled medications as prescribed  Problem # 2:  GERD (ICD-530.81) Assessment: Deteriorated severe Genella Rife that will ppt copd factors plan start omeprazole  Her updated medication list for this problem includes:    Omeprazole 20 Mg Cpdr (Omeprazole) ..... By mouth daily. take one half hour before eating.  Orders: Est. Patient Level IV (04540) Prescription Created Electronically [phone])  Medications Added to Medication List This Visit: 1)  Omeprazole 20 Mg Cpdr (Omeprazole) .... By mouth daily. take one half hour before eating.  Complete Medication List: 1)  Proventil Hfa 108 (90 Base) Mcg/act Aers (Albuterol sulfate) .... Inhale two puffs four times daily as needed sob 2)  Aspirin 81 Mg Tbec (Aspirin) .... Take 1 tablet by mouth once a day 3)  Norvasc 5 Mg Tabs (Amlodipine besylate) .... Take 1 tablet by mouth once a day 4)  Centrum Tabs (Multiple vitamins-minerals) .... Take 1 tablet by mouth once a day 5)  Seroquel 25 Mg Tabs (Quetiapine fumarate) .Marland Kitchen.. 1 tab by mouth qhs 6)  Albuterol Sulfate (2.5 Mg/67ml) 0.083% Nebu (Albuterol sulfate) .... Four times daily  as needed 7)  Brovana 15 Mcg/19ml Nebu (Arformoterol tartrate) .... 2 ml neb bid 8)  Levothyroxine Sodium 25 Mcg Tabs (Levothyroxine sodium) .Marland Kitchen.. 1 tab by mouth daily 9)  Spiriva Handihaler 18 Mcg Caps (Tiotropium bromide monohydrate) .... Two puffs in handihaler daily 10)  Qvar 40 Mcg/act Aers (Beclomethasone dipropionate) .... Two puffs twice daily 11)  Aerochamber Mv Misc (Spacer/aero-holding chambers) .... Use with qvar and proventil hfa 12)  Omeprazole 20 Mg Cpdr (Omeprazole) .... By mouth daily. take one half hour before eating.  Patient Instructions: 1)  Start Dexilant one daily until samples gone then start omeprazole daily (take 1/2 hour before meals) for  6 weeks then stop 2)  Stay on reflux diet 3)  No change in inhaled medications 4)  Return 3 months Prescriptions: OMEPRAZOLE 20 MG  CPDR (OMEPRAZOLE) By mouth daily. Take one half hour before eating.  #45 x 0   Entered and Authorized by:   Storm Frisk MD   Signed by:   Storm Frisk MD on 01/10/2010   Method used:   Electronically to        [facility] [phone]* (retail)       [address] [place], [place]  [address]       Ph: [phone]       Fax: [phone]   RxID:   [id number] SPIRIVA HANDIHALER 18 MCG  CAPS (TIOTROPIUM BROMIDE MONOHYDRATE) Two puffs in handihaler daily Brand medically necessary #90 x 4   Entered and Authorized by:   Storm Frisk MD   Signed by:   Storm Frisk MD on 01/10/2010   Method used:   Print then Give to Patient   RxID:   [id number]

## 2010-11-19 NOTE — Assessment & Plan Note (Signed)
Summary: Pulmonary OV   Primary Provider/Referring Provider:  Seymour Bars D.O.  CC:  Pt here for follow up. Pt states breathing is better since last OV. Pt c/o SOB, non-productive cough, wheezing, and chest tightness all worse with exertion. Pt c/o left shoulder blade pain radiating down left middle back .  History of Present Illness: Pulmonary Consultation       This is a 65 year old woman with COPD primary emphysema Golds Stage III  FeV1 39%    Pt with hx of Lung Ca dx 2005 and partial resection per Dr Edwyna Shell.  No hx of COPD until LUL partial resection.  Had XRT implants. Since 2005 dyspnea worse.  No wheeze.  Nebulizer helps.  No real cough or mucous.  Has heartburn.  April 04, 2010 11:43 AM Since last ov pt is doing well.  There is less dyspnea.  There is some incisional chest wall pain at site of prior lu ng surgery in the LUL posterior chest wall.  There is no excess mucus. Pt denies any significant sore throat, nasal congestion or excess secretions, fever, chills, sweats, unintended weight loss, pleurtic or exertional chest pain, orthopnea PND, or leg swelling Pt denies any increase in rescue therapy over baseline, denies waking up needing it or having any early am or nocturnal exacerbations of coughing/wheezing/or dyspnea. No real cough, dyspnea is better with less heat.     Preventive Screening-Counseling & Management  Alcohol-Tobacco     Smoking Status: quit > 6 months  Current Medications (verified): 1)  Proventil Hfa 108 (90 Base) Mcg/act Aers (Albuterol Sulfate) .... Inhale Two Puffs Four Times Daily As Needed Sob 2)  Aspirin 81 Mg Tbec (Aspirin) .... Take 1 Tablet By Mouth Once A Day 3)  Norvasc 5 Mg  Tabs (Amlodipine Besylate) .... Take 1 Tablet By Mouth Once A Day 4)  Centrum  Tabs (Multiple Vitamins-Minerals) .... Take 1 Tablet By Mouth Once A Day 5)  Seroquel 25 Mg Tabs (Quetiapine Fumarate) .Marland Kitchen.. 1 Tab By Mouth Qhs 6)  Albuterol Sulfate (2.5 Mg/63ml) 0.083%  Nebu  (Albuterol Sulfate) .... Four Times Daily  As Needed 7)  Brovana 15 Mcg/19ml Nebu (Arformoterol Tartrate) .... 2 Ml Neb Bid 8)  Levothyroxine Sodium 25 Mcg Tabs (Levothyroxine Sodium) .Marland Kitchen.. 1 Tab By Mouth Daily 9)  Spiriva Handihaler 18 Mcg  Caps (Tiotropium Bromide Monohydrate) .... Inhale Contents of 1 Capsule Once A Day 10)  Qvar 40 Mcg/act  Aers (Beclomethasone Dipropionate) .... Two Puffs Twice Daily 11)  Aerochamber Mv  Misc (Spacer/aero-Holding Chambers) .... Use With Qvar and Proventil Hfa 12)  Alprazolam 0.5 Mg Tabs (Alprazolam) .Marland Kitchen.. 1 Tab By Mouth Once Daily As Needed For Anxiety 13)  Citalopram Hydrobromide 10 Mg Tabs (Citalopram Hydrobromide) .Marland Kitchen.. 1 Tab By Mouth At Bedtime X 1 Wk Then 2 Tabs By Mouth Qhs 14)  Prilosec Otc 20 Mg Tbec (Omeprazole Magnesium) .... Take As Directed Daily As Needed  Allergies (verified): 1)  ! Penicillin G Potassium (Penicillin G Potassium) 2)  ! Lisinopril-Hydrochlorothiazide (Lisinopril-Hydrochlorothiazide)  Past History:  Past medical, surgical, family and social histories (including risk factors) reviewed, and no changes noted (except as noted below).  Past Medical History: Reviewed history from 11/29/2009 and no changes required. CAD-20% stenosis on cath-no intervention required, (W-S cards) neg nuclear stress test 11-07 Hurthle cell thyroid adenoma, (Benign 2007) Lung Cancer 2007 (Dr Shirline Frees and Dr Edwyna Shell) RLS seed implants LUL by Dr Kathrynn Running 7-07 Peripheral vascular dz iron deficiency anemia; s/p 2 Unit transfusion 2-08  HTN COPD Golds Stage III  FeV1 39% 9/10 colonoscopy normal 2-08 pap smear with Dr Patterson Hammersmith at [facility]  Past Surgical History: Reviewed history from 06/07/2009 and no changes required. appendectomy age 7,  Cardiac Cath,  cholecystectomy age 58,  LUL wedge resection/ VATS, 2005 partial thyroidectomy (Dr Edwyna Shell 2007) Peripheral leg stented LUL lobectomy for cystic cavity and Candida 4-08  No CA seen  Past Pulmonary  History:  Pulmonary History: Pulmonary Function Test  Date: 06/26/2009 Height (in.): 64 Gender: Female  Pre-Spirometry  FVC     Value: 2.07 L/min   Pred: 3.02 L/min     % Pred: 68 % FEV1     Value: 0.86 L     Pred: 2.21 L     % Pred: 39 % FEV1/FVC   Value: 42 %     Pred: 73 %     FEF 25-75   Value: 0.34 L/min   Pred: 2.53 L/min     % Pred: 13 %  Post-Spirometry  FVC     Value: 2.22 L/min   Pred: 3.02 L/min     % Pred: 74 % FEV1     Value: 0.77 L     Pred: 2.21 L     % Pred: 35 % FEV1/FVC   Value: 35 %     Pred: 73 %     FEF 25-75   Value: 0.27 L/min   Pred: 2.53 L/min     % Pred: 11 %  Lung Volumes  TLC     Value: 2.91 L   % Pred: 60 % RV     Value: 0.69 L   % Pred: 37 % DLCO     Value: 10.9 %   % Pred: 55 % DLCO/VA   Value: 3.79 %   % Pred: 102 %  Comments:  Severe airflow obstruction without much change with bronchodilators,  moderate reduction in lung volume consistent with prior lung resection, moderate reduction in DLCO   Evaluation:  severe obstruction with NO significant bronchodilator response  Family History: Reviewed history from 02/17/2007 and no changes required. 2 brothers alive and healthy, father died of laryngeal cancer, had HTN,  mother died- oral. Larynx and endometrial cancer  sister alive- MS & DM, sister alive, breast cancer at 9  Social History: Reviewed history from 01/08/2009 and no changes required. 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  Raises her 60 yo granddaughter (does not have custody).  Quit smoking after 40 pk yrs.  Walks.  Smoking Status:  quit > 6 months  Review of Systems       The patient complains of shortness of breath with activity and chest pain.  The patient denies shortness of breath at rest, productive cough, non-productive cough, coughing up blood, irregular heartbeats, acid heartburn, indigestion, loss of appetite, weight change, abdominal pain, difficulty swallowing, sore throat, tooth/dental  problems, headaches, nasal congestion/difficulty breathing through nose, sneezing, itching, ear ache, anxiety, depression, hand/feet swelling, joint stiffness or pain, rash, change in color of mucus, and fever.    Vital Signs:  Patient profile:   65 year old female Height:      64 inches Weight:      147 pounds BMI:     25.32 O2 Sat:      97 % on Room air Temp:     98.9 degrees F oral Pulse rate:   73 / minute BP sitting:   142 / 78  (left  arm) Cuff size:   regular  Vitals Entered By: Zackery Barefoot (April 04, 2010 11:33 AM)  O2 Flow:  Room air CC: Pt here for follow up. Pt states breathing is better since last OV. Pt c/o SOB, non-productive cough, wheezing, chest tightness all worse with exertion. Pt c/o left shoulder blade pain radiating down left middle back  Pain Assessment Patient in pain? yes     Location: back Intensity: 8 Type: dull Onset of pain  Constant x 1 week Comments Medications reviewed with patient Zackery Barefoot, CMA  April 04, 2010 11:33 AM    Physical Exam  Additional Exam:  Gen: Pleasant, well-nourished, in no distress,  normal affect ENT: No lesions,  mouth clear,  oropharynx clear, no postnasal drip Neck: No JVD, no TMG, no carotid bruits Lungs: No use of accessory muscles, no dullness to percussion, distant BS  prolonged expir phase, posterior chest wall pain at site of prior lobectomy incision Cardiovascular: RRR, heart sounds normal, no murmur or gallops, no peripheral edema Abdomen: soft and NT, no HSM,  BS normal Musculoskeletal: No deformities, no cyanosis or clubbing Neuro: alert, non focal Skin: Warm, no lesions or rashes    Impression & Recommendations:  Problem # 1:  COPD (ICD-496) Assessment Unchanged stable copd plan No change in inhaled medications.   Maintain treatment program as currently prescribed.  Medications Added to Medication List This Visit: 1)  Prilosec Otc 20 Mg Tbec (Omeprazole magnesium) .... Take as directed  daily as needed  Complete Medication List: 1)  Proventil Hfa 108 (90 Base) Mcg/act Aers (Albuterol sulfate) .... Inhale two puffs four times daily as needed sob 2)  Aspirin 81 Mg Tbec (Aspirin) .... Take 1 tablet by mouth once a day 3)  Norvasc 5 Mg Tabs (Amlodipine besylate) .... Take 1 tablet by mouth once a day 4)  Centrum Tabs (Multiple vitamins-minerals) .... Take 1 tablet by mouth once a day 5)  Seroquel 25 Mg Tabs (Quetiapine fumarate) .Marland Kitchen.. 1 tab by mouth qhs 6)  Albuterol Sulfate (2.5 Mg/90ml) 0.083% Nebu (Albuterol sulfate) .... Four times daily  as needed 7)  Brovana 15 Mcg/53ml Nebu (Arformoterol tartrate) .... 2 ml neb bid 8)  Levothyroxine Sodium 25 Mcg Tabs (Levothyroxine sodium) .Marland Kitchen.. 1 tab by mouth daily 9)  Spiriva Handihaler 18 Mcg Caps (Tiotropium bromide monohydrate) .... Inhale contents of 1 capsule once a day 10)  Qvar 40 Mcg/act Aers (Beclomethasone dipropionate) .... Two puffs twice daily 11)  Aerochamber Mv Misc (Spacer/aero-holding chambers) .... Use with qvar and proventil hfa 12)  Alprazolam 0.5 Mg Tabs (Alprazolam) .Marland Kitchen.. 1 tab by mouth once daily as needed for anxiety 13)  Citalopram Hydrobromide 10 Mg Tabs (Citalopram hydrobromide) .Marland Kitchen.. 1 tab by mouth at bedtime x 1 wk then 2 tabs by mouth qhs 14)  Prilosec Otc 20 Mg Tbec (Omeprazole magnesium) .... Take as directed daily as needed  Other Orders: Est. Patient Level III (16109)  Patient Instructions: 1)  No change in medications 2)  Return 4 months 3)  Follow reflux diet Prescriptions: PROVENTIL HFA 108 (90 BASE) MCG/ACT AERS (ALBUTEROL SULFATE) inhale two puffs four times daily as needed sob  ## supp x 4   Entered and Authorized by:   Storm Frisk MD   Signed by:   Storm Frisk MD on 04/04/2010   Method used:   Print then Give to Patient   RxID:   [id number] ALBUTEROL SULFATE (2.5 MG/3ML) 0.083%  NEBU (ALBUTEROL SULFATE) Four times daily  as needed  #120 vials x 4   Entered and Authorized  by:   Storm Frisk MD   Signed by:   Storm Frisk MD on 04/04/2010   Method used:   Print then Give to Patient   RxID:   [id number] QVAR 40 MCG/ACT  AERS (BECLOMETHASONE DIPROPIONATE) Two puffs twice daily  #3 x 4   Entered and Authorized by:   Storm Frisk MD   Signed by:   Storm Frisk MD on 04/04/2010   Method used:   Print then Give to Patient   RxID:   [id number] SPIRIVA HANDIHALER 18 MCG  CAPS (TIOTROPIUM BROMIDE MONOHYDRATE) Inhale contents of 1 capsule once a day  #90 x 4   Entered and Authorized by:   Storm Frisk MD   Signed by:   Storm Frisk MD on 04/04/2010   Method used:   Print then Give to Patient   RxID:   [id number] BROVANA 15 MCG/2ML NEBU (ARFORMOTEROL TARTRATE) 2 ml neb BID  #180 x 4   Entered and Authorized by:   Storm Frisk MD   Signed by:   Storm Frisk MD on 04/04/2010   Method used:   Print then Give to Patient   RxID:   [id number]

## 2010-11-19 NOTE — Miscellaneous (Signed)
Summary: ASCUS pap/neg HPV (GYN)  Clinical Lists Changes  Observations: Added new observation of PAP DUE: 06/2008 (07/02/2007 12:53) Added new observation of PAP SMEAR: Done by [facility] (Dr Joycelyn Man)  ASCUS  HPV-high risk negative.   (06/11/2007 12:54)       Pap Smear  Procedure date:  06/11/2007  Findings:      Done by [facility] (Dr Joycelyn Man)  ASCUS  HPV-high risk negative.    Procedures Next Due Date:    Pap Smear: 06/2008   Pap Smear  Procedure date:  06/11/2007  Findings:      Done by [facility] (Dr Joycelyn Man)  ASCUS  HPV-high risk negative.    Procedures Next Due Date:    Pap Smear: 06/2008

## 2010-11-19 NOTE — Letter (Signed)
Summary: Depression Questionnaire  Depression Questionnaire   Imported By: Lanelle Bal 08/09/2010 15:58:19  _____________________________________________________________________  External Attachment:    Type:   Image     Comment:   External Document

## 2010-11-19 NOTE — Miscellaneous (Signed)
Summary: meds  Clinical Lists Changes  Medications: Added new medication of ZOFRAN ODT 4 MG TBDP (ONDANSETRON) 1 tab by mouth a 8 hr as needed nausea Changed medication from LISINOPRIL-HYDROCHLOROTHIAZIDE 10-12.5 MG TABS (LISINOPRIL-HYDROCHLOROTHIAZIDE) Take 1 tablet by mouth once a day to LISINOPRIL-HYDROCHLOROTHIAZIDE 10-12.5 MG TABS (LISINOPRIL-HYDROCHLOROTHIAZIDE) Take1/2 tablet by mouth once a day Changed medication from PROTONIX 40 MG TBEC (PANTOPRAZOLE SODIUM) Take 1 tablet by mouth once a day to PROTONIX 40 MG TBEC (PANTOPRAZOLE SODIUM) Take 1 tablet by mouth once a day

## 2010-11-19 NOTE — Assessment & Plan Note (Signed)
Summary: COugh-dry, runny nose, SOB x 1 mth rm 2   Vital Signs:  Patient Profile:   65 Years Old Female CC:      Cold & URI symptoms Height:     64 inches Weight:      150 pounds O2 Sat:      95 % O2 treatment:    Room Air Temp:     99.5 degrees F oral Pulse rate:   68 / minute Pulse rhythm:   regular Resp:     14 per minute BP sitting:   129 / 69  (left arm) Cuff size:   regular  Vitals Entered By: Areta Haber CMA (August 31, 2010 1:57 PM)                  Current Allergies: ! PENICILLIN G POTASSIUM (PENICILLIN G POTASSIUM) ! LISINOPRIL-HYDROCHLOROTHIAZIDE (LISINOPRIL-HYDROCHLOROTHIAZIDE)     History of Present Illness Chief Complaint: Cold & URI symptoms History of Present Illness:  Subjective:  Patient complains of persistent cough for about 6 weeks.  She was recently treated with prednisone and azithromycin with some improvement initially, but cough now persists.  No fevers, chills, and sweats.  No pleuritic pain.  No increase in shortness of breath.  She states that she had a satisfactory evaluation by her oncologist about 2 months ago. She notes that her cough occurs primarily in the morning upon awakening and she produces a significant amount of clear sputum.  The cough resolves after she has been out of bed for a while.  She admits that she has GERD, and usually takes a saltine cracker at bedtime to minimize her bedtime reflux symptoms   Current Problems: COUGH, CHRONIC (ICD-786.2) GERD (ICD-530.81) CHRONIC OBSTRUCTIVE PULMONARY DISEASE, ACUTE EXACERBATION (ICD-491.21) DISEASE, ISCHEMIC HEART, CHRONIC NOS (ICD-414.9) HYPOTHYROIDISM, POSTSURGICAL (ICD-244.0) ANEMIA, IRON DEFICIENCY NOS (ICD-280.9) PERIPHERAL VASCULAR DISEASE, UNSPEC. (ICD-440.21) LUNG CANCER (ICD-162.9) HYPERTENSION, BENIGN SYSTEMIC (ICD-401.1) HYPERCHOLESTEROLEMIA (ICD-272.0) DEPRESSION, MAJOR, RECURRENT (ICD-296.30) COPD (ICD-496) ANXIETY (ICD-300.00)   Current Meds PROVENTIL  HFA 108 (90 BASE) MCG/ACT AERS (ALBUTEROL SULFATE) inhale two puffs four times daily as needed sob ASPIRIN 81 MG TBEC (ASPIRIN) Take 1 tablet by mouth once a day NORVASC 5 MG  TABS (AMLODIPINE BESYLATE) Take 1 tablet by mouth once a day CENTRUM  TABS (MULTIPLE VITAMINS-MINERALS) Take 1 tablet by mouth once a day SEROQUEL 25 MG TABS (QUETIAPINE FUMARATE) 1 tab by mouth qhs ALBUTEROL SULFATE (2.5 MG/3ML) 0.083%  NEBU (ALBUTEROL SULFATE) Four times daily  as needed BROVANA 15 MCG/2ML NEBU (ARFORMOTEROL TARTRATE) 2 ml neb BID LEVOTHYROXINE SODIUM 25 MCG TABS (LEVOTHYROXINE SODIUM) 1 tab by mouth daily QVAR 40 MCG/ACT  AERS (BECLOMETHASONE DIPROPIONATE) Two puffs twice daily AEROCHAMBER MV  MISC (SPACER/AERO-HOLDING CHAMBERS) Use with Qvar and proventil HFA ALPRAZOLAM 0.5 MG TABS (ALPRAZOLAM) 1 tab by mouth once daily as needed for anxiety CITALOPRAM HYDROBROMIDE 40 MG TABS (CITALOPRAM HYDROBROMIDE) 1 tab by mouth daily PRILOSEC OTC 20 MG TBEC (OMEPRAZOLE MAGNESIUM) Take as directed daily as needed PREDNISONE 20 MG TABS (PREDNISONE) 3 tabs by mouth once daily x 5 days ZITHROMAX Z-PAK 250 MG TABS (AZITHROMYCIN) 2 tabs by mouth x 1 then 1 tab by mouth daily x 4 days HYDROCODONE-HOMATROPINE 5-1.5 MG/5ML SYRP (HYDROCODONE-HOMATROPINE) 5 ml by mouth at bedtime as needed cough OMEPRAZOLE 20 MG CPDR (OMEPRAZOLE) 1 by mouth qhs  REVIEW OF SYSTEMS Constitutional Symptoms      Denies fever, chills, night sweats, weight loss, weight gain, and fatigue.  Eyes  Denies change in vision, eye pain, eye discharge, glasses, contact lenses, and eye surgery. Ear/Nose/Throat/Mouth       Complains of frequent runny nose.      Denies hearing loss/aids, change in hearing, ear pain, ear discharge, dizziness, frequent nose bleeds, sinus problems, sore throat, hoarseness, and tooth pain or bleeding.  Respiratory       Complains of dry cough and shortness of breath.      Denies productive cough, wheezing, asthma,  bronchitis, and emphysema/COPD.  Cardiovascular       Denies murmurs, chest pain, and tires easily with exhertion.    Gastrointestinal       Denies stomach pain, nausea/vomiting, diarrhea, constipation, blood in bowel movements, and indigestion. Genitourniary       Denies painful urination, kidney stones, and loss of urinary control. Neurological       Denies paralysis, seizures, and fainting/blackouts. Musculoskeletal       Denies muscle pain, joint pain, joint stiffness, decreased range of motion, redness, swelling, muscle weakness, and gout.  Skin       Denies bruising, unusual mles/lumps or sores, and hair/skin or nail changes.  Psych       Denies mood changes, temper/anger issues, anxiety/stress, speech problems, depression, and sleep problems. Other Comments: x 1 mth. pt has not f/u w/her PCP.   Past History:  Past Medical History: Last updated: 11/29/2009 CAD-20% stenosis on cath-no intervention required, (W-S cards) neg nuclear stress test 11-07 Hurthle cell thyroid adenoma, (Benign 2007) Lung Cancer 2007 (Dr Shirline Frees and Dr Edwyna Shell) RLS seed implants LUL by Dr Kathrynn Running 7-07 Peripheral vascular dz iron deficiency anemia; s/p 2 Unit transfusion 2-08 HTN COPD Golds Stage III  FeV1 39% 9/10 colonoscopy normal 2-08 pap smear with Dr Patterson Hammersmith at [facility]  Past Surgical History: Last updated: 06/07/2009 appendectomy age 11,  Cardiac Cath,  cholecystectomy age 47,  LUL wedge resection/ VATS, 2005 partial thyroidectomy (Dr Edwyna Shell 2007) Peripheral leg stented LUL lobectomy for cystic cavity and Candida 4-08  No CA seen  Family History: Last updated: 02/17/2007 2 brothers alive and healthy, father died of laryngeal cancer, had HTN,  mother died- oral. 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  Raises her 81 yo  granddaughter (does not have custody).  Quit smoking after 40 pk yrs.  Walks.    Risk Factors: Smoking Status: quit > 6 months (04/04/2010) Passive Smoke Exposure: no (09/07/2006)   Objective: Patient appears comfortable and alert Eyes:  Pupils are equal, round, and reactive to light and accomdation.  Extraocular movement is intact.  Conjunctivae are not inflamed.  Ears:  Canals normal.  Tympanic membranes normal.   Nose:  Minimal congestion Pharynx:  Normal  Neck:  Supple.  No adenopathy is present.   Lungs:  Clear to auscultation.  Breath sounds are somewhat distant but equal Heart:  Regular rate and rhythm without murmurs, rubs, or gallops.  Abdomen:  Nontender without masses or hepatosplenomegaly.  Bowel sounds are present.  No CVA or flank tenderness.  Extremities:  No edema.  Skin:  No rash Assessment  Assessed GERD as deteriorated - Donna Christen MD New Problems: COUGH, CHRONIC (ICD-786.2)  SUSPECT INCREASED COUGH FROM GERD  Plan New Medications/Changes: OMEPRAZOLE 20 MG CPDR (OMEPRAZOLE) 1 by mouth qhs  #30 x 1, 08/31/2010, Donna Christen MD  New Orders: Pulse Oximetry (  single measurment) [id number] New Patient Level IV [16109] Planning Comments:   Begin trial of omeprazole at bedtime.  Discussed reflux precautions.  Advised to discontinue bedtime snack. Follow-up with PCP   The patient and/or caregiver has been counseled thoroughly with regard to medications prescribed including dosage, schedule, interactions, rationale for use, and possible side effects and they verbalize understanding.  Diagnoses and expected course of recovery discussed and will return if not improved as expected or if the condition worsens. Patient and/or caregiver verbalized understanding.  Prescriptions: OMEPRAZOLE 20 MG CPDR (OMEPRAZOLE) 1 by mouth qhs  #30 x 1   Entered and Authorized by:   Donna Christen MD   Signed by:   Donna Christen MD on 08/31/2010   Method used:   Print then Give to  Patient   RxID:   [id number]   Orders Added: 1)  Pulse Oximetry (single measurment) [94760] 2)  New Patient Level IV [95621]

## 2010-11-19 NOTE — Assessment & Plan Note (Signed)
Summary: f/u depression   Vital Signs:  Patient profile:   65 year old female Height:      64 inches Weight:      144.75 pounds BMI:     24.94 Temp:     98.5 degrees F oral Pulse rate:   77 / minute Pulse rhythm:   regular Resp:     18 per minute BP sitting:   110 / 70  (right arm) Cuff size:   regular  Vitals Entered By: Mervin Kung CMA Duncan Dull) (April 24, 2010 1:46 PM) CC: Room 3  3 month follow up.  Is Patient Diabetic? No Comments Pt states she is only taking Citalopram 1 at bedtime. Nicki Guadalajara Fergerson CMA Duncan Dull)  April 24, 2010 1:47 PM    Primary Care Provider:  Seymour Bars D.O.  CC:  Room 3  3 month follow up. Marland Kitchen  History of Present Illness: 65 yo WF presents for f/u mood.  She has been taking 10 mg of Citalopram at night.  She feels much better.  She feels like she cares more.  She is enjoying her dogs now.    Denies any adverse SEs.  She had a good checkup with Dr Delford Field.  Her labs are UTD.    Allergies: 1)  ! Penicillin G Potassium (Penicillin G Potassium) 2)  ! Lisinopril-Hydrochlorothiazide (Lisinopril-Hydrochlorothiazide)  Past History:  Past Medical History: Reviewed history from 11/29/2009 and no changes required. CAD-20% stenosis on cath-no intervention required, (W-S cards) neg nuclear stress test 11-07 Hurthle cell thyroid adenoma, (Benign 2007) Lung Cancer 2007 (Dr Shirline Frees and Dr Edwyna Shell) RLS seed implants LUL by Dr Kathrynn Running 7-07 Peripheral vascular dz iron deficiency anemia; s/p 2 Unit transfusion 2-08 HTN COPD Golds Stage III  FeV1 39% 9/10 colonoscopy normal 2-08 pap smear with Dr Patterson Hammersmith at [facility]  Past Surgical History: Reviewed history from 06/07/2009 and no changes required. appendectomy age 65,  Cardiac Cath,  cholecystectomy age 50,  LUL wedge resection/ VATS, 2005 partial thyroidectomy (Dr Edwyna Shell 2007) Peripheral leg stented LUL lobectomy for cystic cavity and Candida 4-08  No CA seen  Social History: Reviewed history from  01/08/2009 and no changes required. 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  Raises her 41 yo granddaughter (does not have custody).  Quit smoking after 40 pk yrs.  Walks.    Review of Systems      See HPI  Physical Exam  General:  alert, well-developed, well-nourished, and well-hydrated.   Neck:  no masses.   Lungs:  normal respiratory effort, no intercostal retractions, no accessory muscle use, and normal breath sounds.   Heart:  Heart rate and rhythm regular. No murmurs.  Skin:  color normal.   Psych:  good eye contact, not anxious appearing, and not depressed appearing.     Impression & Recommendations:  Problem # 1:  DEPRESSION, MAJOR, RECURRENT (ICD-296.30) Assessment Improved Symptomatically doing much better.  Spirits seem much brighter.  Due to pharmacy mix up, she still does not have the 20 mg tabs.  Will fill her Citalopram 20 mg/ day.  F/U in 4 mos.  Call if any problems.    Problem # 2:  HYPOTHYROIDISM, POSTSURGICAL (ICD-244.0) Recheck TSH at 4 mos f/u visit. Her updated medication list for this problem includes:    Levothyroxine Sodium 25 Mcg Tabs (Levothyroxine sodium) .Marland Kitchen... 1 tab by mouth daily  Labs Reviewed: TSH: 2.334 (01/22/2010)    Chol: 196 (09/21/2009)   HDL:  68 (09/21/2009)   LDL: 110 (09/21/2009)   TG: 90 (09/21/2009)  Complete Medication List: 1)  Proventil Hfa 108 (90 Base) Mcg/act Aers (Albuterol sulfate) .... Inhale two puffs four times daily as needed sob 2)  Aspirin 81 Mg Tbec (Aspirin) .... Take 1 tablet by mouth once a day 3)  Norvasc 5 Mg Tabs (Amlodipine besylate) .... Take 1 tablet by mouth once a day 4)  Centrum Tabs (Multiple vitamins-minerals) .... Take 1 tablet by mouth once a day 5)  Seroquel 25 Mg Tabs (Quetiapine fumarate) .Marland Kitchen.. 1 tab by mouth qhs 6)  Albuterol Sulfate (2.5 Mg/51ml) 0.083% Nebu (Albuterol sulfate) .... Four times daily  as needed 7)  Brovana 15 Mcg/24ml Nebu (Arformoterol tartrate) .... 2 ml neb  bid 8)  Levothyroxine Sodium 25 Mcg Tabs (Levothyroxine sodium) .Marland Kitchen.. 1 tab by mouth daily 9)  Spiriva Handihaler 18 Mcg Caps (Tiotropium bromide monohydrate) .... Inhale contents of 1 capsule once a day 10)  Qvar 40 Mcg/act Aers (Beclomethasone dipropionate) .... Two puffs twice daily 11)  Aerochamber Mv Misc (Spacer/aero-holding chambers) .... Use with qvar and proventil hfa 12)  Alprazolam 0.5 Mg Tabs (Alprazolam) .Marland Kitchen.. 1 tab by mouth once daily as needed for anxiety 13)  Citalopram Hydrobromide 20 Mg Tabs (Citalopram hydrobromide) .Marland Kitchen.. 1 tab by mouth qhs 14)  Prilosec Otc 20 Mg Tbec (Omeprazole magnesium) .... Take as directed daily as needed  Patient Instructions: 1)  Take Citalopram 20 mg at bedtime each day for mood. 2)  You are doing great! 3)  F/U mood in 4 mos. Prescriptions: LEVOTHYROXINE SODIUM 25 MCG TABS (LEVOTHYROXINE SODIUM) 1 tab by mouth daily  #90 x 1   Entered and Authorized by:   Seymour Bars DO   Signed by:   Seymour Bars DO on 04/24/2010   Method used:   Printed then faxed to ...       [facility] [phone]* (retail)       [address] [place], [place]  [address]       Ph: [phone]       Fax: [phone]   RxID:   [id number] NORVASC 5 MG  TABS (AMLODIPINE BESYLATE) Take 1 tablet by mouth once a day  #90 x 1   Entered and Authorized by:   Seymour Bars DO   Signed by:   Seymour Bars DO on 04/24/2010   Method used:   Printed then faxed to ...       [facility] [phone]* (retail)       [address] [place], [place]  [address]       Ph: [phone]       Fax: [phone]   RxID:   [id number] CITALOPRAM HYDROBROMIDE 20 MG TABS (CITALOPRAM HYDROBROMIDE) 1 tab by mouth qhs  #30 x 3   Entered and Authorized by:   Seymour Bars DO   Signed by:   Seymour Bars DO on 04/24/2010   Method used:   Electronically to        [facility] [phone]* (retail)       [address] [place], [place]  [address]       Ph: [phone]       Fax:  [phone]   RxID:   [id number]   Current Allergies (reviewed today): ! PENICILLIN G POTASSIUM (PENICILLIN G POTASSIUM) !  LISINOPRIL-HYDROCHLOROTHIAZIDE (LISINOPRIL-HYDROCHLOROTHIAZIDE)

## 2010-11-19 NOTE — Assessment & Plan Note (Signed)
Summary: Pulmonary OV   Primary Provider/Referring Provider:  Seymour Bars D.O.  CC:  4 mo follow up.  states she is having good days and bad days.  states she is having increased SOB with activity x1wk.  occasionally having dry cough with wheezing.  started albuterol neb x2wks ago-currently taking it tid. [name]  History of Present Illness: Pulmonary Consultation       This is a 65 year old woman with COPD primary emphysema Golds Stage III  FeV1 39%    Pt with hx of Lung Ca dx 2005 and partial resection per Dr Edwyna Shell.  No hx of COPD until LUL partial resection.  Had XRT implants. Since 2005 dyspnea worse.  No wheeze.  Nebulizer helps.  No real cough or mucous.  Has heartburn.  There are not any alleviating or precipitating factors noted.  The symptoms do not generally fluctuate. Pt denies any significant sore throat, nasal congestion or excess secretions, fever, chills, sweats, unintended weight loss, pleurtic or exertional chest pain, orthopnea PND, or leg swelling Pt denies any increase in rescue therapy over baseline, denies waking up needing it or having any early am or nocturnal exacerbations of coughing/wheezing/or dyspnea.   November 29, 2009 11:01 AM Notes more dyspnea x 2weeks.   Mucous is white but volume is no change in volume of mucous.  If gets upset will get dyspneic.  Notes more wheezing. Cold air is an issue.  Pt denies any significant sore throat, nasal congestion or excess secretions, fever, chills, sweats, unintended weight loss, pleurtic or exertional chest pain, orthopnea PND, or leg swelling Pt denies any increase in rescue therapy over baseline, denies waking up needing it or having any early am or nocturnal exacerbations of coughing/wheezing/or dyspnea.   Preventive Screening-Counseling & Management  Alcohol-Tobacco     Smoking Status: quit > 6 months  Current Medications (verified): 1)  Proventil Hfa 108 (90 Base) Mcg/act Aers (Albuterol Sulfate) .... Inhale Two  Puffs Four Times Daily As Needed Sob 2)  Aspirin 81 Mg Tbec (Aspirin) .... Take 1 Tablet By Mouth Once A Day 3)  Norvasc 5 Mg  Tabs (Amlodipine Besylate) .... Take 1 Tablet By Mouth Once A Day 4)  Centrum  Tabs (Multiple Vitamins-Minerals) .... Take 1 Tablet By Mouth Once A Day 5)  Seroquel 25 Mg Tabs (Quetiapine Fumarate) .[name].. 1 Tab By Mouth Qhs 6)  Albuterol Sulfate (2.5 Mg/76ml) 0.083%  Nebu (Albuterol Sulfate) .... Four Times Daily or Every 6 Hours As Needed 7)  Brovana 15 Mcg/51ml Nebu (Arformoterol Tartrate) .... 2 Ml Neb Bid 8)  Levothyroxine Sodium 25 Mcg Tabs (Levothyroxine Sodium) .[name].. 1 Tab By Mouth Daily 9)  Spiriva Handihaler 18 Mcg  Caps (Tiotropium Bromide Monohydrate) .... Two Puffs in Handihaler Daily  Allergies (verified): 1)  ! Penicillin G Potassium (Penicillin G Potassium) 2)  ! Lisinopril-Hydrochlorothiazide (Lisinopril-Hydrochlorothiazide)  Past History:  Past medical, surgical, family and social histories (including risk factors) reviewed, and no changes noted (except as noted below).  Past Medical History: CAD-20% stenosis on cath-no intervention required, (W-S cards) neg nuclear stress test 11-07 Hurthle cell thyroid adenoma, (Benign 2007) Lung Cancer 2007 (Dr Shirline Frees and Dr Edwyna Shell) RLS seed implants LUL by Dr Kathrynn Running 7-07 Peripheral vascular dz iron deficiency anemia; s/p 2 Unit transfusion 2-08 HTN COPD Golds Stage III  FeV1 39% 9/10 colonoscopy normal 2-08 pap smear with Dr Patterson Hammersmith at [facility]  Past Surgical History: Reviewed history from 06/07/2009 and no changes required. appendectomy age 43,  Cardiac  Cath,  cholecystectomy age 76,  LUL wedge resection/ VATS, 2005 partial thyroidectomy (Dr Edwyna Shell 2007) Peripheral leg stented LUL lobectomy for cystic cavity and Candida 4-08  No CA seen  Past Pulmonary History:  Pulmonary History: Pulmonary Function Test  Date: 06/26/2009 Height (in.): 64 Gender: Female  Pre-Spirometry  FVC     Value:  2.07 L/min   Pred: 3.02 L/min     % Pred: 68 % FEV1     Value: 0.86 L     Pred: 2.21 L     % Pred: 39 % FEV1/FVC   Value: 42 %     Pred: 73 %     FEF 25-75   Value: 0.34 L/min   Pred: 2.53 L/min     % Pred: 13 %  Post-Spirometry  FVC     Value: 2.22 L/min   Pred: 3.02 L/min     % Pred: 74 % FEV1     Value: 0.77 L     Pred: 2.21 L     % Pred: 35 % FEV1/FVC   Value: 35 %     Pred: 73 %     FEF 25-75   Value: 0.27 L/min   Pred: 2.53 L/min     % Pred: 11 %  Lung Volumes  TLC     Value: 2.91 L   % Pred: 60 % RV     Value: 0.69 L   % Pred: 37 % DLCO     Value: 10.9 %   % Pred: 55 % DLCO/VA   Value: 3.79 %   % Pred: 102 %  Comments:  Severe airflow obstruction without much change with bronchodilators,  moderate reduction in lung volume consistent with prior lung resection, moderate reduction in DLCO   Evaluation:  severe obstruction with NO significant bronchodilator response  Family History: Reviewed history from 02/17/2007 and no changes required. 2 brothers alive and healthy, father died of laryngeal cancer, had HTN,  mother died- oral. Larynx and endometrial cancer  sister alive- MS & DM, sister alive, breast cancer at 54  Social History: Reviewed history from 01/08/2009 and no changes required. 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  Raises her 42 yo granddaughter (does not have custody).  Quit smoking after 40 pk yrs.  Walks.    Vital Signs:  Patient profile:   65 year old female Height:      63.5 inches Weight:      146 pounds BMI:     25.55 O2 Sat:      98 % on Room air Temp:     99.4 degrees F oral Pulse rate:   81 / minute BP sitting:   132 / 70  (right arm) Cuff size:   regular  Vitals Entered By: Gweneth Dimitri RN (November 29, 2009 10:57 AM)  O2 Flow:  Room air CC: 4 mo follow up.  states she is having good days and bad days.  states she is having increased SOB with activity x1wk.  occasionally having dry cough with wheezing.  started  albuterol neb x2wks ago-currently taking it tid.  Comments Medications reviewed with patient Daytime contact number verified with patient. Gweneth Dimitri RN  November 29, 2009 10:57 AM    Physical Exam  Additional Exam:  Gen: Pleasant, well-nourished, in no distress,  normal affect ENT: No lesions,  mouth clear,  oropharynx clear, no postnasal drip Neck: No JVD, no TMG, no carotid bruits Lungs: No use  of accessory muscles, no dullness to percussion, distant BS  prolonged expir phase Cardiovascular: RRR, heart sounds normal, no murmur or gallops, no peripheral edema Abdomen: soft and NT, no HSM,  BS normal Musculoskeletal: No deformities, no cyanosis or clubbing Neuro: alert, non focal Skin: Warm, no lesions or rashes    Impression & Recommendations:  Problem # 1:  CHRONIC OBSTRUCTIVE PULMONARY DISEASE, ACUTE EXACERBATION (ICD-491.21) Assessment Deteriorated acute tracehobronchitis wiht flare   plan Start Qvar  two puff twice daily , use aerochamber Stay on Spiriva and brovana Use albuterol in nebulizer and Proventil as needed only, not on schedule Take prednisone 10mg  4 each am x3days, 3 x 3days, 2 x 3days, 1 x 3days then stop Return [place] 4-6 weeks Orders: Est. Patient Level IV (95621) HFA Instruction (30865)  Medications Added to Medication List This Visit: 1)  Albuterol Sulfate (2.5 Mg/10ml) 0.083% Nebu (Albuterol sulfate) .... Four times daily  as needed 2)  Qvar 40 Mcg/act Aers (Beclomethasone dipropionate) .... Two puffs twice daily 3)  Prednisone 10 Mg Tabs (Prednisone) .... Take as directed 4 each am x3days, 3 x 3days, 2 x 3days, 1 x 3days then stop 4)  Aerochamber Mv Misc (Spacer/aero-holding chambers) .... Use with qvar and proventil hfa  Complete Medication List: 1)  Proventil Hfa 108 (90 Base) Mcg/act Aers (Albuterol sulfate) .... Inhale two puffs four times daily as needed sob 2)  Aspirin 81 Mg Tbec (Aspirin) .... Take 1 tablet by mouth once a day 3)   Norvasc 5 Mg Tabs (Amlodipine besylate) .... Take 1 tablet by mouth once a day 4)  Centrum Tabs (Multiple vitamins-minerals) .... Take 1 tablet by mouth once a day 5)  Seroquel 25 Mg Tabs (Quetiapine fumarate) .[name].. 1 tab by mouth qhs 6)  Albuterol Sulfate (2.5 Mg/81ml) 0.083% Nebu (Albuterol sulfate) .... Four times daily  as needed 7)  Brovana 15 Mcg/43ml Nebu (Arformoterol tartrate) .... 2 ml neb bid 8)  Levothyroxine Sodium 25 Mcg Tabs (Levothyroxine sodium) .[name].. 1 tab by mouth daily 9)  Spiriva Handihaler 18 Mcg Caps (Tiotropium bromide monohydrate) .... Two puffs in handihaler daily 10)  Qvar 40 Mcg/act Aers (Beclomethasone dipropionate) .... Two puffs twice daily 11)  Prednisone 10 Mg Tabs (Prednisone) .... Take as directed 4 each am x3days, 3 x 3days, 2 x 3days, 1 x 3days then stop 12)  Aerochamber Mv Misc (Spacer/aero-holding chambers) .... Use with qvar and proventil hfa  Patient Instructions: 1)  Start Qvar  two puff twice daily , use aerochamber 2)  Stay on Spiriva and brovana 3)  Use albuterol in nebulizer and Proventil as needed only, not on schedule 4)  Take prednisone 10mg  4 each am x3days, 3 x 3days, 2 x 3days, 1 x 3days then stop 5)  Return [place] 4-6 weeks Prescriptions: AEROCHAMBER MV  MISC (SPACER/AERO-HOLDING CHAMBERS) Use with Qvar and proventil HFA  #1 x 0   Entered and Authorized by:   Storm Frisk MD   Signed by:   Storm Frisk MD on 11/29/2009   Method used:   Print then Give to Patient   RxID:   [id number] PREDNISONE 10 MG  TABS (PREDNISONE) Take as directed 4 each am x3days, 3 x 3days, 2 x 3days, 1 x 3days then stop  #30 x 0   Entered and Authorized by:   Storm Frisk MD   Signed by:   Storm Frisk MD on 11/29/2009   Method used:   Electronically to  [facility] [phone]* (retail)       [address] [place], [place]  [address]       Ph: [phone]       Fax: [phone]   RxID:   [id number] QVAR 40 MCG/ACT  AERS  (BECLOMETHASONE DIPROPIONATE) Two puffs twice daily  #1 x 6   Entered and Authorized by:   Storm Frisk MD   Signed by:   Storm Frisk MD on 11/29/2009   Method used:   Print then Give to Patient   RxID:   [id number]

## 2010-11-19 NOTE — Progress Notes (Signed)
  Phone Note Outgoing Call Call back at [facility] Phone [phone]   Call placed by: Lajean Saver RN,  September 02, 2010 10:39 AM Call placed to: Patient Action Taken: Phone Call Completed Summary of Call: Callback: Patient reports her symptoms have improved witht he new medication. She is coughing less frequently

## 2010-11-21 NOTE — Letter (Signed)
Summary: Depression Questionnaire  Depression Questionnaire   Imported By: Lanelle Bal 11/08/2010 11:00:01  _____________________________________________________________________  External Attachment:    Type:   Image     Comment:   External Document

## 2010-11-21 NOTE — Assessment & Plan Note (Signed)
Summary: cough and breathing issues/cb dr Delford Field pt   Primary Provider/Referring Provider:  Seymour Bars D.O.  CC:  Pt c/o productive cough with yellow mucus worse at night, increased SOB with activity, wheezing , and chest tightness.  History of Present Illness: 65 year old woman with COPD primary emphysema Gold Stage III  FeV1 39%    Pt with hx of Lung Ca dx 2005 and partial resection per Dr Edwyna Shell.  No hx of COPD until LUL partial resection.  Had XRT implants. Since 2005 dyspnea worse.  No wheeze.  Nebulizer helps.  No real cough or mucous.  Has heartburn.  April 04, 2010 11:43 AM Since last ov pt is doing well.  There is less dyspnea.  There is some incisional chest wall pain at site of prior lu ng surgery in the LUL posterior chest wall.  There is no excess mucus. No real cough, dyspnea is better with less heat.    October 08, 2010 1:42 PM  c/o cough x 2 weeks, minimal clear phlegm, no preceding URI , CXR sep & today do not show any new infx, CT chest mar' 11 - no recurrence of CA. would like to be better for christmas occasional heartburn - on generic omepazole, post nasal drip +  Preventive Screening-Counseling & Management  Alcohol-Tobacco     Smoking Status: quit > 6 months     Year Quit: 2007     Pack years: 30     Passive Smoke Exposure: no  Current Medications (verified): 1)  Proventil Hfa 108 (90 Base) Mcg/act Aers (Albuterol Sulfate) .... Inhale Two Puffs Four Times Daily As Needed Sob 2)  Aspirin 81 Mg Tbec (Aspirin) .... Take 1 Tablet By Mouth Once A Day 3)  Norvasc 5 Mg  Tabs (Amlodipine Besylate) .... Take 1 Tablet By Mouth Once A Day 4)  Centrum  Tabs (Multiple Vitamins-Minerals) .... Take 1 Tablet By Mouth Once A Day 5)  Seroquel 25 Mg Tabs (Quetiapine Fumarate) .Marland Kitchen.. 1 Tab By Mouth Qhs 6)  Albuterol Sulfate (2.5 Mg/46ml) 0.083%  Nebu (Albuterol Sulfate) .... Four Times Daily  As Needed 7)  Brovana 15 Mcg/57ml Nebu (Arformoterol Tartrate) .... 2 Ml Neb Bid 8)   Levothyroxine Sodium 25 Mcg Tabs (Levothyroxine Sodium) .Marland Kitchen.. 1 Tab By Mouth Daily 9)  Qvar 40 Mcg/act  Aers (Beclomethasone Dipropionate) .... Two Puffs Twice Daily 10)  Aerochamber Mv  Misc (Spacer/aero-Holding Chambers) .... Use With Qvar and Proventil Hfa 11)  Alprazolam 0.5 Mg Tabs (Alprazolam) .Marland Kitchen.. 1 Tab By Mouth Once Daily As Needed For Anxiety 12)  Citalopram Hydrobromide 40 Mg Tabs (Citalopram Hydrobromide) .Marland Kitchen.. 1 Tab By Mouth Daily 13)  Prilosec Otc 20 Mg Tbec (Omeprazole Magnesium) .... Take As Directed Daily As Needed 14)  Omeprazole 20 Mg Cpdr (Omeprazole) .Marland Kitchen.. 1 By Mouth Qhs  Allergies (verified): 1)  ! Penicillin G Potassium (Penicillin G Potassium) 2)  ! Lisinopril-Hydrochlorothiazide (Lisinopril-Hydrochlorothiazide)  Past History:  Past Medical History: Last updated: 11/29/2009 CAD-20% stenosis on cath-no intervention required, (W-S cards) neg nuclear stress test 11-07 Hurthle cell thyroid adenoma, (Benign 2007) Lung Cancer 2007 (Dr Shirline Frees and Dr Edwyna Shell) RLS seed implants LUL by Dr Kathrynn Running 7-07 Peripheral vascular dz iron deficiency anemia; s/p 2 Unit transfusion 2-08 HTN COPD Golds Stage III  FeV1 39% 9/10 colonoscopy normal 2-08 pap smear with Dr Patterson Hammersmith at [facility]  Social History: Last updated: 01/08/2009 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  Raises her 82 yo  granddaughter (does not have custody).  Quit smoking after 40 pk yrs.  Walks.    Past Pulmonary History:  Pulmonary History: Pulmonary Function Test  Date: 06/26/2009 Height (in.): 64 Gender: Female  Pre-Spirometry  FVC     Value: 2.07 L/min   Pred: 3.02 L/min     % Pred: 68 % FEV1     Value: 0.86 L     Pred: 2.21 L     % Pred: 39 % FEV1/FVC   Value: 42 %     Pred: 73 %     FEF 25-75   Value: 0.34 L/min   Pred: 2.53 L/min     % Pred: 13 %  Post-Spirometry  FVC     Value: 2.22 L/min   Pred: 3.02 L/min     % Pred: 74 % FEV1     Value: 0.77 L     Pred: 2.21 L     %  Pred: 35 % FEV1/FVC   Value: 35 %     Pred: 73 %     FEF 25-75   Value: 0.27 L/min   Pred: 2.53 L/min     % Pred: 11 %  Lung Volumes  TLC     Value: 2.91 L   % Pred: 60 % RV     Value: 0.69 L   % Pred: 37 % DLCO     Value: 10.9 %   % Pred: 55 % DLCO/VA   Value: 3.79 %   % Pred: 102 %  Comments:  Severe airflow obstruction without much change with bronchodilators,  moderate reduction in lung volume consistent with prior lung resection, moderate reduction in DLCO   Evaluation:  severe obstruction with NO significant bronchodilator response  Review of Systems       The patient complains of prolonged cough.  The patient denies anorexia, fever, weight loss, weight gain, vision loss, decreased hearing, hoarseness, chest pain, syncope, dyspnea on exertion, peripheral edema, headaches, hemoptysis, abdominal pain, melena, hematochezia, severe indigestion/heartburn, hematuria, muscle weakness, suspicious skin lesions, difficulty walking, depression, unusual weight change, abnormal bleeding, enlarged lymph nodes, and angioedema.    Vital Signs:  Patient profile:   65 year old female Height:      64 inches Weight:      149 pounds BMI:     25.67 O2 Sat:      96 % on Room air Temp:     98.4 degrees F oral Pulse rate:   69 / minute BP sitting:   144 / 70  (left arm) Cuff size:   regular  Vitals Entered By: Zackery Barefoot CMA (October 08, 2010 1:34 PM)  O2 Flow:  Room air CC: Pt c/o productive cough with yellow mucus worse at night, increased SOB with activity, wheezing , chest tightness Comments Medications reviewed with patient Verified contact number and pharmacy with patient Zackery Barefoot CMA  October 08, 2010 1:35 PM    Physical Exam  Additional Exam:  Gen: Pleasant, well-nourished, in no distress,  normal affect ENT: No lesions,  mouth clear,  oropharynx clear, no postnasal drip Neck: No JVD, no TMG, no carotid bruits Lungs: No use of accessory muscles, no dullness to  percussion, distant BS , posterior chest wall pain at site of prior lobectomy incision Cardiovascular: RRR, heart sounds normal, no murmur or gallops, no peripheral edema Musculoskeletal: No deformities, no cyanosis or clubbing     CXR  Procedure date:  10/08/2010  Findings:      Comparison:  07/03/2010   Findings: Prior lobectomy left upper lobe.  Extensive pleural scarring and pulmonary scarring  with  retraction of the left hilum is unchanged.  No recurrent mass lesion.   Chronic lung disease.  Chronic scarring and collapse in the right middle lobe is unchanged.  The lungs are hyperinflated.   IMPRESSION: No significant change.  Impression & Recommendations:  Problem # 1:  COUGH, CHRONIC (ICD-786.2) Symptomatic treatment in the short run with tessalon & codeine In th elong run , suppression of post nasal drip with antihistaminic / nasal steroid combination &  GERD with PPI (generic OK for now) Orders: Est. Patient Level III (16109) T-2 View CXR (71020TC)  Problem # 2:  COPD (ICD-496) No bronchospasm today  Problem # 3:  LUNG CANCER (ICD-162.9) No evidence of recurrence Surveillance CT per Dr Edwyna Shell  Medications Added to Medication List This Visit: 1)  Benzonatate 200 Mg Caps (Benzonatate) .... Three times a day 2)  Promethazine-codeine 6.25-10 Mg/66ml Syrp (Promethazine-codeine) .... 2.5- 5ml  by mouth two times a day as needed  Patient Instructions: 1)  Please schedule a follow-up appointment in 2 months with PW 2)  A chest x-ray has been recommended.  Your imaging study may require preauthorization.  3)  Tessalon perles three times a day  4)  Codeine syrup two times a day as needed - will make you sleepy Prescriptions: PROMETHAZINE-CODEINE 6.25-10 MG/5ML SYRP (PROMETHAZINE-CODEINE) 2.5- 5ml  by mouth two times a day as needed  #120 x 0   Entered and Authorized by:   Comer Locket. Vassie Loll MD   Signed by:   Comer Locket Vassie Loll MD on 10/08/2010   Method used:   Print then  Give to Patient   RxID:   [id number] BENZONATATE 200 MG CAPS (BENZONATATE) three times a day  #60 x 1   Entered and Authorized by:   Comer Locket. Vassie Loll MD   Signed by:   Comer Locket Vassie Loll MD on 10/08/2010   Method used:   Electronically to        [facility] [phone]* (retail)       [address] [place], [place]  [address]       Ph: [phone]       Fax: [phone]   RxID:   [id number]

## 2010-11-21 NOTE — Assessment & Plan Note (Signed)
Summary: f/u depression   Vital Signs:  Patient profile:   65 year old female Height:      64 inches Weight:      149 pounds BMI:     25.67 O2 Sat:      94 % on Room air Pulse rate:   73 / minute BP sitting:   129 / 68  (left arm) Cuff size:   regular  Vitals Entered By: Payton Spark CMA (October 31, 2010 10:55 AM)  O2 Flow:  Room air CC: 3 mo f/u mood. Wants to take any Rx w/ her   Primary Care Provider:  Seymour Bars D.O.  CC:  3 mo f/u mood. Wants to take any Rx w/ her.  History of Present Illness: 65 yo [name] presents for f/u depression.  She thinks that her mood has improved some but her PHQ 9 score is still a 20 today. She has some family stressors but she is still enjoying time with her granddaughter.  She has been taking Citalopram 40 mg/ day and Seroquel 25 mg qPM which has really helped her sleep.  She denies any suicidal thoughts or panic attacks.    Current Medications (verified): 1)  Proventil Hfa 108 (90 Base) Mcg/act Aers (Albuterol Sulfate) .... Inhale Two Puffs Four Times Daily As Needed Sob 2)  Aspirin 81 Mg Tbec (Aspirin) .... Take 1 Tablet By Mouth Once A Day 3)  Norvasc 5 Mg  Tabs (Amlodipine Besylate) .... Take 1 Tablet By Mouth Once A Day 4)  Centrum  Tabs (Multiple Vitamins-Minerals) .... Take 1 Tablet By Mouth Once A Day 5)  Seroquel 25 Mg Tabs (Quetiapine Fumarate) .Marland Kitchen.. 1 Tab By Mouth Qhs 6)  Albuterol Sulfate (2.5 Mg/27ml) 0.083%  Nebu (Albuterol Sulfate) .... Four Times Daily  As Needed 7)  Brovana 15 Mcg/90ml Nebu (Arformoterol Tartrate) .... 2 Ml Neb Bid 8)  Levothyroxine Sodium 25 Mcg Tabs (Levothyroxine Sodium) .Marland Kitchen.. 1 Tab By Mouth Daily 9)  Qvar 40 Mcg/act  Aers (Beclomethasone Dipropionate) .... Two Puffs Twice Daily 10)  Aerochamber Mv  Misc (Spacer/aero-Holding Chambers) .... Use With Qvar and Proventil Hfa 11)  Alprazolam 0.5 Mg Tabs (Alprazolam) .Marland Kitchen.. 1 Tab By Mouth Once Daily As Needed For Anxiety 12)  Citalopram Hydrobromide 40 Mg Tabs  (Citalopram Hydrobromide) .Marland Kitchen.. 1 Tab By Mouth Daily 13)  Omeprazole 20 Mg Cpdr (Omeprazole) .Marland Kitchen.. 1 By Mouth Qhs  Allergies (verified): 1)  ! Penicillin G Potassium (Penicillin G Potassium) 2)  ! Lisinopril-Hydrochlorothiazide (Lisinopril-Hydrochlorothiazide)  Past History:  Past Medical History: Reviewed history from 11/29/2009 and no changes required. CAD-20% stenosis on cath-no intervention required, (W-S cards) neg nuclear stress test 11-07 Hurthle cell thyroid adenoma, (Benign 2007) Lung Cancer 2007 (Dr Shirline Frees and Dr Edwyna Shell) RLS seed implants LUL by Dr Kathrynn Running 7-07 Peripheral vascular dz iron deficiency anemia; s/p 2 Unit transfusion 2-08 HTN COPD Golds Stage III  FeV1 39% 9/10 colonoscopy normal 2-08 pap smear with Dr Patterson Hammersmith at [facility]  Social History: Reviewed history from 01/08/2009 and no changes required. 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  Raises her 60 yo granddaughter (does not have custody).  Quit smoking after 40 pk yrs.  Walks.    Review of Systems      See HPI  Physical Exam  General:  alert, well-developed, well-nourished, and well-hydrated.   Head:  normocephalic and atraumatic.   Lungs:  nonlabored, rhonchi bibasilar, faint with exp wheezing.   Heart:  Heart rate  and rhythm regular. No murmurs.  Extremities:  no LE edema Skin:  color normal.   Psych:  good eye contact, not anxious appearing, and not depressed appearing.     Impression & Recommendations:  Problem # 1:  DEPRESSION, MAJOR, RECURRENT (ICD-296.30) PHQ 9 score of 20.  Will wean off citopram and start Pristiq.  Samples and savings card given. Will also increase her Seroquel from 25 to 50 mg at night. Call if any problems. F/u in 6 wks.  Complete Medication List: 1)  Proventil Hfa 108 (90 Base) Mcg/act Aers (Albuterol sulfate) .... Inhale two puffs four times daily as needed sob 2)  Aspirin 81 Mg Tbec (Aspirin) .... Take 1 tablet by mouth once a  day 3)  Norvasc 5 Mg Tabs (Amlodipine besylate) .... Take 1 tablet by mouth once a day 4)  Centrum Tabs (Multiple vitamins-minerals) .... Take 1 tablet by mouth once a day 5)  Seroquel 50 Mg Tabs (Quetiapine fumarate) .Marland Kitchen.. 1 tab by mouth qpm 6)  Albuterol Sulfate (2.5 Mg/67ml) 0.083% Nebu (Albuterol sulfate) .... Four times daily  as needed 7)  Brovana 15 Mcg/75ml Nebu (Arformoterol tartrate) .... 2 ml neb bid 8)  Levothyroxine Sodium 25 Mcg Tabs (Levothyroxine sodium) .Marland Kitchen.. 1 tab by mouth daily 9)  Qvar 40 Mcg/act Aers (Beclomethasone dipropionate) .... Two puffs twice daily 10)  Aerochamber Mv Misc (Spacer/aero-holding chambers) .... Use with qvar and proventil hfa 11)  Alprazolam 0.5 Mg Tabs (Alprazolam) .Marland Kitchen.. 1 tab by mouth once daily as needed for anxiety 12)  Citalopram Hydrobromide 20 Mg Tabs (Citalopram hydrobromide) .Marland Kitchen.. 1 tab by mouth daily x 5 days then 1/2 tab by mouth daily x 5 days then stop 13)  Omeprazole 20 Mg Cpdr (Omeprazole) .Marland Kitchen.. 1 by mouth qhs 14)  Pristiq 50 Mg Xr24h-tab (Desvenlafaxine succinate) .Marland Kitchen.. 1 tab by mouth daily  Other Orders: T-CBC w/Diff ([id number]) T-Comprehensive Metabolic Panel [id number]) T-Lipid Profile ([id number])  Patient Instructions: 1)  Increase Seroquel to 50 mg in the evenings. 2)  Wean off Citalopram-- take 20 mg / day x 5 days then 10 mg / day x 5 days then stop.  Once OFF, start Pristiq 50 mg once daily (in the AM with breakfast).  Call me if any problems. 3)  Update your fasting labs one morning downstairs. 4)  I will call you w/ the results. 5)  Return for f/u mood in 6 wks. Prescriptions: PRISTIQ 50 MG XR24H-TAB (DESVENLAFAXINE SUCCINATE) 1 tab by mouth daily  #30 x 1   Entered and Authorized by:   Seymour Bars DO   Signed by:   Seymour Bars DO on 10/31/2010   Method used:   Electronically to        [facility] [phone]* (retail)       [address] [place], [place]  [address]       Ph: [phone]       Fax: [phone]    RxID:   [id number] SEROQUEL 50 MG TABS (QUETIAPINE FUMARATE) 1 tab by mouth qPM  #30 x 3   Entered and Authorized by:   Seymour Bars DO   Signed by:   Seymour Bars DO on 10/31/2010   Method used:   Electronically to        [facility] [phone]* (retail)       [address] [place], [place]  [address]  Ph: [phone]       Fax: [phone]   RxID:   [id number] ALPRAZOLAM 0.5 MG TABS (ALPRAZOLAM) 1 tab by mouth once daily as needed for anxiety  #90 x 0   Entered and Authorized by:   Seymour Bars DO   Signed by:   Seymour Bars DO on 10/31/2010   Method used:   Printed then faxed to ...       [facility] [phone]* (retail)       [address] [place], [place]  [address]       Ph: [phone]       Fax: [phone]   RxID:   [id number] CITALOPRAM HYDROBROMIDE 20 MG TABS (CITALOPRAM HYDROBROMIDE) 1 tab by mouth daily x 5 days then 1/2 tab by mouth daily x 5 days then stop  #8 tabs x 0   Entered and Authorized by:   Seymour Bars DO   Signed by:   Seymour Bars DO on 10/31/2010   Method used:   Electronically to        [facility] [phone]* (retail)       [address] [place], [place]  [address]       Ph: [phone]       Fax: [phone]   RxID:   [id number]    Orders Added: 1)  T-CBC w/Diff [[id number]] 2)  T-Comprehensive Metabolic Panel [[id number]] 3)  T-Lipid Profile [[id number]] 4)  Est. Patient Level III [[address]169]

## 2010-11-27 NOTE — Progress Notes (Signed)
Summary: Refill request cough syrup  Phone Note Refill Request Message from:  [facility]  Promethazine-Codeine Syrup Take 1/2-1 teaspoonful by mouth two times a day as needed  #129mL [facility] [phone] Last filled 10/08/2010 Please advise. Thanks!  Initial call taken by: Zackery Barefoot CMA,  November 18, 2010 5:03 PM  Follow-up for Phone Call        Refill approved-nurse to complete Follow-up by: Storm Frisk MD,  November 18, 2010 8:57 PM  Additional Follow-up for Phone Call Additional follow up Details #1::        refill sent. Carron Curie CMA  November 19, 2010 9:32 AM     New/Updated Medications: PROMETHAZINE-CODEINE 6.25-10 MG/5ML SYRP (PROMETHAZINE-CODEINE) 1/2 to 1 teaspoon twice daily as needed Prescriptions: PROMETHAZINE-CODEINE 6.25-10 MG/5ML SYRP (PROMETHAZINE-CODEINE) 1/2 to 1 teaspoon twice daily as needed  #165ml x 0   Entered by:   Carron Curie CMA   Authorized by:   Storm Frisk MD   Signed by:   Carron Curie CMA on 11/19/2010   Method used:   Telephoned to ...       [facility] [phone]* (retail)       [address] [place], [place]  [address]       Ph: [phone]       Fax: [phone]   RxID:   [id number]

## 2010-12-05 ENCOUNTER — Encounter: Payer: Self-pay | Admitting: Critical Care Medicine

## 2010-12-05 ENCOUNTER — Ambulatory Visit (INDEPENDENT_AMBULATORY_CARE_PROVIDER_SITE_OTHER): Payer: Medicare Other | Admitting: Critical Care Medicine

## 2010-12-05 DIAGNOSIS — J441 Chronic obstructive pulmonary disease with (acute) exacerbation: Secondary | ICD-10-CM

## 2010-12-11 NOTE — Assessment & Plan Note (Signed)
Summary: Pulmonary OV   Primary Provider/Referring Provider:  Seymour Bars D.O.  CC:  2 month follow up.  Pt states she does have SOB with exertion, prod cough with clear mucus, wheezing, and chest tightness but no worse from last OV.Marland Kitchen  History of Present Illness: 65 year old woman with COPD primary emphysema Gold Stage III  FeV1 39%    Pt with hx of Lung Ca dx 2005 and partial resection per Dr Edwyna Shell.  No hx of COPD until LUL partial resection.  Had XRT implants. Since 2005 dyspnea worse.  No wheeze.  Nebulizer helps.  No real cough or mucous.  Has heartburn.  April 04, 2010 11:43 AM Since last ov pt is doing well.  There is less dyspnea.  There is some incisional chest wall pain at site of prior lu ng surgery in the LUL posterior chest wall.  There is no excess mucus. No real cough, dyspnea is better with less heat.    October 08, 2010 1:42 PM  c/o cough x 2 weeks, minimal clear phlegm, no preceding URI , CXR sep & today do not show any new infx, CT chest mar' 11 - no recurrence of CA. would like to be better for christmas occasional heartburn - on generic omepazole, post nasal drip +  December 05, 2010 9:03 AM The pt notes her cough comes and goes,  the pt has a sl wheeze,  if coughs wheeze goes away.  The mucus now is clear There is no real chest pain.  THe pt notes some dyspnea if walks around.   Current Medications (verified): 1)  Proventil Hfa 108 (90 Base) Mcg/act Aers (Albuterol Sulfate) .... Inhale Two Puffs Four Times Daily As Needed Sob 2)  Aspirin 81 Mg Tbec (Aspirin) .... Take 1 Tablet By Mouth Once A Day 3)  Norvasc 5 Mg  Tabs (Amlodipine Besylate) .... Take 1 Tablet By Mouth Once A Day 4)  Centrum  Tabs (Multiple Vitamins-Minerals) .... Take 1 Tablet By Mouth Once A Day 5)  Seroquel 50 Mg Tabs (Quetiapine Fumarate) .Marland Kitchen.. 1 Tab By Mouth Qpm 6)  Albuterol Sulfate (2.5 Mg/29ml) 0.083%  Nebu (Albuterol Sulfate) .... Four Times Daily  As Needed 7)  Brovana 15 Mcg/66ml Nebu  (Arformoterol Tartrate) .... 2 Ml Neb Bid 8)  Levothyroxine Sodium 25 Mcg Tabs (Levothyroxine Sodium) .Marland Kitchen.. 1 Tab By Mouth Daily 9)  Qvar 40 Mcg/act  Aers (Beclomethasone Dipropionate) .... Two Puffs Twice Daily 10)  Aerochamber Mv  Misc (Spacer/aero-Holding Chambers) .... Use With Qvar and Proventil Hfa 11)  Alprazolam 0.5 Mg Tabs (Alprazolam) .Marland Kitchen.. 1 Tab By Mouth Once Daily As Needed For Anxiety 12)  Omeprazole 20 Mg Cpdr (Omeprazole) .Marland Kitchen.. 1 By Mouth Qhs 13)  Pristiq 50 Mg Xr24h-Tab (Desvenlafaxine Succinate) .Marland Kitchen.. 1 Tab By Mouth Daily 14)  Promethazine-Codeine 6.25-10 Mg/38ml Syrp (Promethazine-Codeine) .... 1/2 To 1 Teaspoon Twice Daily As Needed  Allergies (verified): 1)  ! Penicillin G Potassium (Penicillin G Potassium) 2)  ! Lisinopril-Hydrochlorothiazide (Lisinopril-Hydrochlorothiazide)  Past History:  Past medical, surgical, family and social histories (including risk factors) reviewed, and no changes noted (except as noted below).  Past Medical History: Reviewed history from 11/29/2009 and no changes required. CAD-20% stenosis on cath-no intervention required, (W-S cards) neg nuclear stress test 11-07 Hurthle cell thyroid adenoma, (Benign 2007) Lung Cancer 2007 (Dr Shirline Frees and Dr Edwyna Shell) RLS seed implants LUL by Dr Kathrynn Running 7-07 Peripheral vascular dz iron deficiency anemia; s/p 2 Unit transfusion 2-08 HTN COPD Golds Stage III  FeV1 39% 9/10 colonoscopy normal 2-08 pap smear with Dr Patterson Hammersmith at [facility]  Past Surgical History: Reviewed history from 06/07/2009 and no changes required. appendectomy age 59,  Cardiac Cath,  cholecystectomy age 35,  LUL wedge resection/ VATS, 2005 partial thyroidectomy (Dr Edwyna Shell 2007) Peripheral leg stented LUL lobectomy for cystic cavity and Candida 4-08  No CA seen  Past Pulmonary History:  Pulmonary History: Pulmonary Function Test  Date: 06/26/2009 Height (in.): 64 Gender: Female  Pre-Spirometry  FVC     Value: 2.07 L/min    Pred: 3.02 L/min     % Pred: 68 % FEV1     Value: 0.86 L     Pred: 2.21 L     % Pred: 39 % FEV1/FVC   Value: 42 %     Pred: 73 %     FEF 25-75   Value: 0.34 L/min   Pred: 2.53 L/min     % Pred: 13 %  Post-Spirometry  FVC     Value: 2.22 L/min   Pred: 3.02 L/min     % Pred: 74 % FEV1     Value: 0.77 L     Pred: 2.21 L     % Pred: 35 % FEV1/FVC   Value: 35 %     Pred: 73 %     FEF 25-75   Value: 0.27 L/min   Pred: 2.53 L/min     % Pred: 11 %  Lung Volumes  TLC     Value: 2.91 L   % Pred: 60 % RV     Value: 0.69 L   % Pred: 37 % DLCO     Value: 10.9 %   % Pred: 55 % DLCO/VA   Value: 3.79 %   % Pred: 102 %  Comments:  Severe airflow obstruction without much change with bronchodilators,  moderate reduction in lung volume consistent with prior lung resection, moderate reduction in DLCO   Evaluation:  severe obstruction with NO significant bronchodilator response  Family History: Reviewed history from 02/17/2007 and no changes required. 2 brothers alive and healthy, father died of laryngeal cancer, had HTN,  mother died- oral. Larynx and endometrial cancer  sister alive- MS & DM, sister alive, breast cancer at 52  Social History: Reviewed history from 01/08/2009 and no changes required. 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  Raises her 51 yo granddaughter (does not have custody).  Quit smoking after 40 pk yrs.  Walks.    Review of Systems       The patient complains of shortness of breath with activity, non-productive cough, acid heartburn, and indigestion.  The patient denies shortness of breath at rest, productive cough, coughing up blood, chest pain, irregular heartbeats, loss of appetite, weight change, abdominal pain, difficulty swallowing, sore throat, tooth/dental problems, headaches, nasal congestion/difficulty breathing through nose, sneezing, itching, ear ache, anxiety, depression, hand/feet swelling, joint stiffness or pain, rash, change in color  of mucus, and fever.    Vital Signs:  Patient profile:   65 year old female Height:      64 inches Weight:      152 pounds BMI:     26.19 O2 Sat:      96 % on Room air Temp:     98.5 degrees F oral Pulse rate:   78 / minute BP sitting:   140 / 78  (left arm) Cuff size:   regular  Vitals Entered By: Gweneth Dimitri  RN (December 05, 2010 8:59 AM)  O2 Flow:  Room air CC: 2 month follow up.  Pt states she does have SOB with exertion, prod cough with clear mucus, wheezing, chest tightness but no worse from last OV. Comments Medications reviewed with patient Daytime contact number verified with patient. Gweneth Dimitri RN  December 05, 2010 8:59 AM    Physical Exam  Additional Exam:  Gen: Pleasant, well-nourished, in no distress,  normal affect ENT: No lesions,  mouth clear,  oropharynx clear, no postnasal drip Neck: No JVD, no TMG, no carotid bruits Lungs: No use of accessory muscles, no dullness to percussion, distant BS , posterior chest wall pain at site of prior lobectomy incision Cardiovascular: RRR, heart sounds normal, no murmur or gallops, no peripheral edema Musculoskeletal: No deformities, no cyanosis or clubbing     Impression & Recommendations:  Problem # 1:  COPD (ICD-496) Assessment Improved Improved Copd exacerbation with improved airflow plan No change in inhaled medications.   Maintain treatment program as currently prescribed.  Complete Medication List: 1)  Proventil Hfa 108 (90 Base) Mcg/act Aers (Albuterol sulfate) .... Inhale two puffs four times daily as needed sob 2)  Aspirin 81 Mg Tbec (Aspirin) .... Take 1 tablet by mouth once a day 3)  Norvasc 5 Mg Tabs (Amlodipine besylate) .... Take 1 tablet by mouth once a day 4)  Centrum Tabs (Multiple vitamins-minerals) .... Take 1 tablet by mouth once a day 5)  Seroquel 50 Mg Tabs (Quetiapine fumarate) .Marland Kitchen.. 1 tab by mouth qpm 6)  Albuterol Sulfate (2.5 Mg/75ml) 0.083% Nebu (Albuterol sulfate) .... Four times  daily  as needed 7)  Brovana 15 Mcg/38ml Nebu (Arformoterol tartrate) .... 2 ml neb bid 8)  Levothyroxine Sodium 25 Mcg Tabs (Levothyroxine sodium) .Marland Kitchen.. 1 tab by mouth daily 9)  Qvar 40 Mcg/act Aers (Beclomethasone dipropionate) .... Two puffs twice daily 10)  Aerochamber Mv Misc (Spacer/aero-holding chambers) .... Use with qvar and proventil hfa 11)  Alprazolam 0.5 Mg Tabs (Alprazolam) .Marland Kitchen.. 1 tab by mouth once daily as needed for anxiety 12)  Omeprazole 20 Mg Cpdr (Omeprazole) .Marland Kitchen.. 1 by mouth qhs 13)  Pristiq 50 Mg Xr24h-tab (Desvenlafaxine succinate) .Marland Kitchen.. 1 tab by mouth daily 14)  Promethazine-codeine 6.25-10 Mg/44ml Syrp (Promethazine-codeine) .... 1/2 to 1 teaspoon twice daily as needed  Other Orders: Est. Patient Level III ([address])  Patient Instructions: 1)  No change in medications 2)  Return in      4    months Prescriptions: OMEPRAZOLE 20 MG CPDR (OMEPRAZOLE) 1 by mouth qhs  #90 x 4   Entered and Authorized by:   Storm Frisk MD   Signed by:   Storm Frisk MD on 12/05/2010   Method used:   Print then Give to Patient   RxID:   [id number] QVAR 40 MCG/ACT  AERS (BECLOMETHASONE DIPROPIONATE) Two puffs twice daily  #3 x 4   Entered and Authorized by:   Storm Frisk MD   Signed by:   Storm Frisk MD on 12/05/2010   Method used:   Print then Give to Patient   RxID:   [id number] EXBMWUX 15 MCG/2ML NEBU (ARFORMOTEROL TARTRATE) 2 ml neb BID  #180 x 4   Entered and Authorized by:   Storm Frisk MD   Signed by:   Storm Frisk MD on 12/05/2010   Method used:   Print then Give to Patient   RxID:   [id number] PROVENTIL HFA 108 (90 BASE) MCG/ACT AERS (  ALBUTEROL SULFATE) inhale two puffs four times daily as needed sob  ## supp x 6   Entered and Authorized by:   Storm Frisk MD   Signed by:   Storm Frisk MD on 12/05/2010   Method used:   Print then Give to Patient   RxID:   [id number]

## 2010-12-12 ENCOUNTER — Encounter: Payer: Self-pay | Admitting: Family Medicine

## 2010-12-12 ENCOUNTER — Ambulatory Visit (INDEPENDENT_AMBULATORY_CARE_PROVIDER_SITE_OTHER): Payer: Medicare Other | Admitting: Family Medicine

## 2010-12-12 ENCOUNTER — Other Ambulatory Visit: Payer: Self-pay | Admitting: Thoracic Surgery

## 2010-12-12 DIAGNOSIS — C3490 Malignant neoplasm of unspecified part of unspecified bronchus or lung: Secondary | ICD-10-CM

## 2010-12-12 DIAGNOSIS — F339 Major depressive disorder, recurrent, unspecified: Secondary | ICD-10-CM

## 2010-12-17 NOTE — Assessment & Plan Note (Signed)
Summary: f/u depression   Vital Signs:  Patient profile:   65 year old female Height:      64 inches Weight:      149 pounds BMI:     25.67 O2 Sat:      96 % on Room air Pulse rate:   84 / minute BP sitting:   132 / 82  (left arm) Cuff size:   regular  Vitals Entered By: Payton Spark CMA (December 12, 2010 11:28 AM)  O2 Flow:  Room air CC: F/U depression. Doing well   Primary Care Provider:  Seymour Bars D.O.  CC:  F/U depression. Doing well.  History of Present Illness: 65 yo WF presents for 5-6 wk mood f/u.  She has depression and anxiety.  Last PHQ 9 20.  Home life stable.  At last visit, I tapered her off Celexa and started Pristiq.  Increased her Seroquel from 25--> 50 mg at bedtime.    Denies adverse SEs and feels great.  Has a good support system.    Allergies: 1)  ! Penicillin G Potassium (Penicillin G Potassium) 2)  ! Lisinopril-Hydrochlorothiazide (Lisinopril-Hydrochlorothiazide)  Past History:  Past Medical History: Reviewed history from 11/29/2009 and no changes required. CAD-20% stenosis on cath-no intervention required, (W-S cards) neg nuclear stress test 11-07 Hurthle cell thyroid adenoma, (Benign 2007) Lung Cancer 2007 (Dr Shirline Frees and Dr Edwyna Shell) RLS seed implants LUL by Dr Kathrynn Running 7-07 Peripheral vascular dz iron deficiency anemia; s/p 2 Unit transfusion 2-08 HTN COPD Golds Stage III  FeV1 39% 9/10 colonoscopy normal 2-08 pap smear with Dr Patterson Hammersmith at [facility]  Past Surgical History: Reviewed history from 06/07/2009 and no changes required. appendectomy age 61,  Cardiac Cath,  cholecystectomy age 19,  LUL wedge resection/ VATS, 2005 partial thyroidectomy (Dr Edwyna Shell 2007) Peripheral leg stented LUL lobectomy for cystic cavity and Candida 4-08  No CA seen  Family History: Reviewed history from 02/17/2007 and no changes required. 2 brothers alive and healthy, father died of laryngeal cancer, had HTN,  mother died- oral. Larynx and  endometrial cancer  sister alive- MS & DM, sister alive, breast cancer at 36  Social History: Reviewed history from 01/08/2009 and no changes required. 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  Raises her 67 yo granddaughter (does not have custody).  Quit smoking after 40 pk yrs.  Walks.    Review of Systems Psych:  Denies anxiety, depression, irritability, panic attacks, and suicidal thoughts/plans.  Physical Exam  General:  alert, well-developed, well-nourished, and well-hydrated.   Lungs:  nonlabored, rhonchi bibasilar, faint with exp wheezing.   Heart:  Heart rate and rhythm regular. No murmurs.  Psych:  good eye contact, not anxious appearing, and not depressed appearing.     Impression & Recommendations:  Problem # 1:  DEPRESSION, MAJOR, RECURRENT (ICD-296.30) Assessment Improved Much improved! PHQ 9 score improved from 20-->0 with change from Celexa to Pristiq and increasing Seroquel from 25-->50 mg at bedtime.  Not having any adverse SEs. Will RF meds and f/u in 3 mos to check for wt gain, increased glucose and increased cholesterol from Seroqeul.  Complete Medication List: 1)  Proventil Hfa 108 (90 Base) Mcg/act Aers (Albuterol sulfate) .... Inhale two puffs four times daily as needed sob 2)  Aspirin 81 Mg Tbec (Aspirin) .... Take 1 tablet by mouth once a day 3)  Norvasc 5 Mg Tabs (Amlodipine besylate) .... Take 1 tablet by mouth once a day 4)  Centrum Tabs (Multiple vitamins-minerals) .... Take 1 tablet by mouth once a day 5)  Seroquel 50 Mg Tabs (Quetiapine fumarate) .Marland Kitchen.. 1 tab by mouth qpm 6)  Albuterol Sulfate (2.5 Mg/11ml) 0.083% Nebu (Albuterol sulfate) .... Four times daily  as needed 7)  Brovana 15 Mcg/98ml Nebu (Arformoterol tartrate) .... 2 ml neb bid 8)  Levothyroxine Sodium 25 Mcg Tabs (Levothyroxine sodium) .Marland Kitchen.. 1 tab by mouth daily 9)  Qvar 40 Mcg/act Aers (Beclomethasone dipropionate) .... Two puffs twice daily 10)  Aerochamber Mv  Misc (Spacer/aero-holding chambers) .... Use with qvar and proventil hfa 11)  Alprazolam 0.5 Mg Tabs (Alprazolam) .Marland Kitchen.. 1 tab by mouth once daily as needed for anxiety 12)  Omeprazole 20 Mg Cpdr (Omeprazole) .Marland Kitchen.. 1 by mouth qhs 13)  Pristiq 50 Mg Xr24h-tab (Desvenlafaxine succinate) .Marland Kitchen.. 1 tab by mouth daily 14)  Promethazine-codeine 6.25-10 Mg/53ml Syrp (Promethazine-codeine) .... 1/2 to 1 teaspoon twice daily as needed   Patient Instructions: 1)  Stay on current meds. 2)  f/u mood in 3 wks with labs.   Prescriptions: SEROQUEL 50 MG TABS (QUETIAPINE FUMARATE) 1 tab by mouth qPM  #90 x 1   Entered and Authorized by:   Seymour Bars DO   Signed by:   Seymour Bars DO on 12/12/2010   Method used:   Print then Give to Patient   RxID:   [id number] PRISTIQ 50 MG XR24H-TAB (DESVENLAFAXINE SUCCINATE) 1 tab by mouth daily  #90 x 1   Entered and Authorized by:   Seymour Bars DO   Signed by:   Seymour Bars DO on 12/12/2010   Method used:   Print then Give to Patient   RxID:   [id number]    Orders Added: 1)  Est. Patient Level III [84696]

## 2011-01-07 NOTE — Letter (Signed)
Summary: Patient Health Questionnaire  Patient Health Questionnaire   Imported By: Maryln Gottron 12/31/2010 09:45:16  _____________________________________________________________________  External Attachment:    Type:   Image     Comment:   External Document

## 2011-01-08 ENCOUNTER — Ambulatory Visit
Admission: RE | Admit: 2011-01-08 | Discharge: 2011-01-08 | Disposition: A | Discharge status: Home / Self Care | Payer: Medicare Other | Source: Ambulatory Visit | Attending: Thoracic Surgery | Admitting: Thoracic Surgery

## 2011-01-08 ENCOUNTER — Other Ambulatory Visit: Payer: Self-pay | Admitting: Critical Care Medicine

## 2011-01-08 ENCOUNTER — Ambulatory Visit (INDEPENDENT_AMBULATORY_CARE_PROVIDER_SITE_OTHER): Payer: Medicare Other | Admitting: Thoracic Surgery

## 2011-01-08 DIAGNOSIS — C3490 Malignant neoplasm of unspecified part of unspecified bronchus or lung: Secondary | ICD-10-CM

## 2011-01-08 MED ORDER — ALBUTEROL SULFATE HFA 108 (90 BASE) MCG/ACT IN AERS: 2.0000 | INHALATION_SPRAY | RESPIRATORY_TRACT | Status: DC | PRN

## 2011-01-08 NOTE — Telephone Encounter (Signed)
Spoke with pt and she states she needs a new rx for proventil inhaler to send to [facility] because the one that she has was signed for brand medically nec. And she wants generic. New rx printed and will give to PW to sign. Pt will come by and pick-up today. Carron Curie, MA

## 2011-01-09 NOTE — Assessment & Plan Note (Signed)
OFFICE VISIT  Salemi, Wauneta L DOB:  September 13, 1946                                        January 08, 2011 CHART #:  [id number]  The patient returns today and her CT scan we do not have the final reading.  There is no evidence of recurrence of cancer.  Five years since we resected her and 4-1/2 years since we had to do her completion lobectomy for complications from the seed implantation.  She is doing well overall.  Her lungs are clear to auscultation and percussion.  She has had no medical issues.  Plan to see her back again in 6 months with another CT scan.  If that is negative, then I will release her back to her medical doctor.  Ines Bloomer, M.D. Electronically Signed  DPB/MEDQ  D:  01/08/2011  T:  01/09/2011  Job:  [id number]

## 2011-01-16 NOTE — Telephone Encounter (Signed)
Spoke with patient and she did pick-up rx. Nothing further needed. Carron Curie, CMA

## 2011-02-07 ENCOUNTER — Other Ambulatory Visit: Payer: Self-pay | Admitting: *Deleted

## 2011-02-07 MED ORDER — AMLODIPINE BESYLATE 5 MG PO TABS: 5.0000 mg | ORAL_TABLET | Freq: Every day | ORAL | Status: DC

## 2011-02-07 MED ORDER — LEVOTHYROXINE SODIUM 25 MCG PO TABS: 25.0000 ug | ORAL_TABLET | Freq: Every day | ORAL | Status: DC

## 2011-02-07 NOTE — Telephone Encounter (Signed)
Pt will not have enough levothyroxine and amlodipine to make it to appt on 5/15.  30 day supply sent.  Reminded pt to make sure she keeps appt on 5/15.

## 2011-02-10 ENCOUNTER — Other Ambulatory Visit: Payer: Self-pay | Admitting: *Deleted

## 2011-02-10 MED ORDER — LEVOTHYROXINE SODIUM 25 MCG PO TABS: 25.0000 ug | ORAL_TABLET | Freq: Every day | ORAL | Status: DC

## 2011-02-10 MED ORDER — AMLODIPINE BESYLATE 5 MG PO TABS: 5.0000 mg | ORAL_TABLET | Freq: Every day | ORAL | Status: DC

## 2011-03-03 ENCOUNTER — Encounter: Payer: Self-pay | Admitting: Family Medicine

## 2011-03-04 ENCOUNTER — Encounter: Payer: Self-pay | Admitting: Family Medicine

## 2011-03-04 ENCOUNTER — Ambulatory Visit (INDEPENDENT_AMBULATORY_CARE_PROVIDER_SITE_OTHER): Payer: Medicare Other | Admitting: Family Medicine

## 2011-03-04 ENCOUNTER — Ambulatory Visit
Admission: RE | Admit: 2011-03-04 | Discharge: 2011-03-04 | Disposition: A | Discharge status: Home / Self Care | Payer: Medicare Other | Source: Ambulatory Visit | Attending: Family Medicine | Admitting: Family Medicine

## 2011-03-04 ENCOUNTER — Telehealth: Payer: Self-pay | Admitting: Family Medicine

## 2011-03-04 VITALS — BP 124/74 | HR 74 | Ht 64.0 in | Wt 151.0 lb

## 2011-03-04 DIAGNOSIS — Z23 Encounter for immunization: Secondary | ICD-10-CM

## 2011-03-04 DIAGNOSIS — R142 Eructation: Secondary | ICD-10-CM

## 2011-03-04 DIAGNOSIS — R7309 Other abnormal glucose: Secondary | ICD-10-CM

## 2011-03-04 DIAGNOSIS — E785 Hyperlipidemia, unspecified: Secondary | ICD-10-CM

## 2011-03-04 DIAGNOSIS — R140 Abdominal distension (gaseous): Secondary | ICD-10-CM

## 2011-03-04 DIAGNOSIS — Z09 Encounter for follow-up examination after completed treatment for conditions other than malignant neoplasm: Secondary | ICD-10-CM

## 2011-03-04 DIAGNOSIS — I10 Essential (primary) hypertension: Secondary | ICD-10-CM

## 2011-03-04 DIAGNOSIS — E890 Postprocedural hypothyroidism: Secondary | ICD-10-CM

## 2011-03-04 DIAGNOSIS — E039 Hypothyroidism, unspecified: Secondary | ICD-10-CM

## 2011-03-04 DIAGNOSIS — R141 Gas pain: Secondary | ICD-10-CM

## 2011-03-04 DIAGNOSIS — F339 Major depressive disorder, recurrent, unspecified: Secondary | ICD-10-CM

## 2011-03-04 MED ORDER — ALPRAZOLAM 0.5 MG PO TABS: 0.5000 mg | ORAL_TABLET | Freq: Every evening | ORAL | Status: DC | PRN

## 2011-03-04 MED ORDER — AMLODIPINE BESYLATE 5 MG PO TABS: 5.0000 mg | ORAL_TABLET | Freq: Every day | ORAL | Status: DC

## 2011-03-04 MED ORDER — LEVOTHYROXINE SODIUM 25 MCG PO TABS: 25.0000 ug | ORAL_TABLET | Freq: Every day | ORAL | Status: DC

## 2011-03-04 MED ORDER — DESVENLAFAXINE SUCCINATE ER 50 MG PO TB24: 50.0000 mg | ORAL_TABLET | Freq: Every day | ORAL | Status: DC

## 2011-03-04 MED ORDER — QUETIAPINE FUMARATE 50 MG PO TABS: 50.0000 mg | ORAL_TABLET | Freq: Every day | ORAL | Status: DC

## 2011-03-04 MED ORDER — TETANUS-DIPHTH-ACELL PERTUSSIS 5-2.5-18.5 LF-MCG/0.5 IM SUSP: 0.5000 mL | Freq: Once | INTRAMUSCULAR | Status: DC

## 2011-03-04 NOTE — Letter (Signed)
October 06, 2007   Seymour Bars, D.O.  [facility].  [address]  [place], [place] [address]   Re:  LERONDA, LEWERS L                DOB:  09-21-1946   Dear Dr. Cathey Endow:   I saw Ms. Rylan Desantis here with a chest x-ray which is stable.  Her  blood pressure is 128/72, pulse 92, respirations 18, sats were 97%.  She  is breathing well and looks very good.  She has still got shortness of  breath with exertion.  Has some mild left lobe thoracotomy pain but  again is improved consistent from where we started.  I plan to see her  back again in 3 months and will get a CT scan which will be  approximately 1 year since her previous surgery.   Sincerely,   Ines Bloomer, M.D.  Electronically Signed   DPB/MEDQ  D:  10/06/2007  T:  10/07/2007  Job:  [id number]

## 2011-03-04 NOTE — Letter (Signed)
January 16, 2010   Seymour Bars, DO  [facility].  [address]  [place], [place] [address]   Re:  [name], [name]                DOB:  1945-12-31   Dear Dr. Cathey Endow:   I saw the patient in my office today and we got a CT scan now 4 years  since her surgery.  No evidence of recurrence, at least on the  preliminary report, wait for the final report, and if there is any  change contact the patient.  Her lung function had improved since she  has seen Dr. Delford Field.  We put her on Spiriva.  She is doing well.  Her  blood pressure is 133/63, pulse 81, respirations 18, sats were 98%.  The  lungs were clear bilaterally.  I will see her back again in 6 months  with a chest x-ray.   Ines Bloomer, M.D.  Electronically Signed   DPB/MEDQ  D:  01/16/2010  T:  01/17/2010  Job:  [id number]   cc:   Charlcie Cradle. Delford Field, MD, FCCP

## 2011-03-04 NOTE — Telephone Encounter (Signed)
Pls let pt know that her xray shows moderate stool retention.  Start Colace 1 x  Day as a stool softener and add Miralax 1-2 x a day until abdominal distension resolves.  Be sure to drink plenty of water and eat plenty of fruits and veggies.

## 2011-03-04 NOTE — Assessment & Plan Note (Signed)
OFFICE VISIT   [name], [name]  DOB:  1946/09/12                                        May 05, 2007  CHART #:  [id number]   [name] came today, she is now over a year since her original  surgery and she is 4 months into her lobectomy. She is doing well with  no evidence, chest x-ray just shows small anterior space, no cough. Her  breathing has improved with sats were 98%.  Has occasional episodes of  shortness of breath.  Her blood pressure is 128/70, pulse 84,  respirations 18.  I told her to gradually increase her activities.  We  will see her in 3 months and get a CT scan of the chest.   Ines Bloomer, M.D.  Electronically Signed   DPB/MEDQ  D:  05/05/2007  T:  05/06/2007  Job:  [id number]   cc:   [place] Daphine Deutscher, MD  Lajuana Matte, MD

## 2011-03-04 NOTE — Assessment & Plan Note (Signed)
OFFICE VISIT   [name], [name]  DOB:  30-Jan-1946                                        July 18, 2008  CHART #:  [id number]   HISTORY:  The patient returns on today's day for routine office visit  with a chest x-ray.  Her last CT scan was approximately 6 months ago and  that showed no evidence of recurrence of her cancer and normal  postoperative changes, where she underwent a left upper lobectomy.  Currently, she reports some mild pain complaints that are chronic at  this point.  She is tolerating her routine activities fairly well,  however.   CHEST X-RAY:  A chest x-ray was done on today's date.  There are no new  findings.  She does have postoperative changes.   PHYSICAL EXAMINATION:  VITAL SIGNS:  Blood pressure 126/77, pulse 69,  respirations 18, and oxygen saturation is 92% on room air.  GENERAL:  A well-developed adult female in no acute distress.  LUNGS:  Fair air exchange throughout.  CARDIOVASCULAR:  Regular rate and rhythm.  Incision is inspected, it is  well healed.   ASSESSMENT:  The patient is making adequate overall progress in her  recovery and has no current evidence of recurrence.  Dr. Edwyna Shell is  planning to obtain a chest x-ray in 6 months in followup.   Rowe Clack, P.A.-C.   [name]  D:  07/18/2008  T:  07/18/2008  Job:  [id number]   cc:   Artist Pais Kathrynn Running, M.D.  Seymour Bars, D.O.

## 2011-03-04 NOTE — Telephone Encounter (Signed)
LMOM for Pt to CB 

## 2011-03-04 NOTE — Assessment & Plan Note (Signed)
OFFICE VISIT   [name], [name]  DOB:  15-May-1946                                        July 03, 2010  CHART #:  [id number]   HISTORY:  The patient is seen in routine followup on today's date.  She  is status post left upper lobectomy and seed implantation done in May  2007.  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  On today's date, overall she feels fairly well.  She denies significant pulmonary issues and is on chronic medications  for her pulmonary disease.   CHEST X-RAY:  A chest x-ray was obtained on today's date.  It shows  stable findings with chronic changes in the left upper lobe.  There are  no new findings.   PHYSICAL EXAMINATION:  Vital Signs:  Blood pressure is 140/84, pulse 78,  respirations 18, and oxygen saturation is 95% on room air.  General  Appearance:  Well-developed adult female, in no acute distress.  Pulmonary:  Clear breath sounds bilaterally.  Cardiac:  Regular rate and  rhythm.  Normal S1 and S2.   ASSESSMENT:  The patient is very stable.  She will be seen additionally  by Dr. Edwyna Shell and his plans at this point are for a repeat CT scan in  approximately 6 months.   Rowe Clack, P.A.-C.   Sherryll Burger  D:  07/03/2010  T:  07/04/2010  Job:  [id number]

## 2011-03-04 NOTE — Letter (Signed)
June 27, 2009   Charlcie Cradle. Delford Field, MD, FCCP  [address]  [place], [place] [address]   Re:  Kathy, [name]                DOB:  [birth year]   Dear Luisa Hart,   I saw the patient in the office today.  Her blood pressure is 129/85,  pulse 82, respirations 18, sats were 97%.  Chest x-ray showed normal  postoperative changes.  We will get another CT scan in 6 months for  follow up.  She then apparently will be seeing you regarding her  dyspnea.  She still has some left chest pain from her previous surgeries  and I told her that this would probably continue overall, although.  I  will see her again in 6 months.   Sincerely,   Ines Bloomer, M.D.  Electronically Signed   DPB/MEDQ  D:  06/27/2009  T:  06/27/2009  Job:  [id number]

## 2011-03-04 NOTE — Letter (Signed)
December 27, 2008   Seymour Bars, D.O.  [facility].  [address]  [place], [place] [address]   Re:  [name], [name]                DOB:  Sep 06, 1946   Dear Dr. Cathey Endow:   The patient is back today and her followup scan, now over 3 years since  her surgery, shows no recurrence of her cancer, just postoperative  changes.  Her blood pressure was 120/68, pulse 78, respirations 18, and  sats were 98%.  Lungs are clear to auscultation and percussion.  We will  see her back again in 6 months with a chest x-ray.   Sincerely,   Ines Bloomer, M.D.  Electronically Signed   DPB/MEDQ  D:  12/27/2008  T:  12/28/2008  Job:  [id number]   cc:   Artist Pais Kathrynn Running, M.D.

## 2011-03-04 NOTE — Assessment & Plan Note (Signed)
Exam c/w constipation.  Will get a KUB today.  If FOS, will start her on a bowel regimen. Discussed high fiber diet and increasing exercise and water intake today.  Had a colonoscopy last year.

## 2011-03-04 NOTE — Assessment & Plan Note (Signed)
PHQ 9 score: 2.  Doing well on Pristiq and Seroquel.  Due to have fasting glucose and lipids rechecked for potential effects of seroqeul.  Has gained only 2 lbs.

## 2011-03-04 NOTE — Letter (Signed)
August 04, 2007   Seymour Bars, D.O.  [facility].  [address]  [place], [place] [address]   Re:  [name], [name]                DOB:  03/07/1946   Dear Dr. Cathey Endow:   I saw the patient back in the office today and repeated her CT scan and  the CT scan shows no evidence of recurrence of her cancer.  The left  chest shows clearing of the left chest with no bronchopleural effusion  and there is just some fluid anteriorly.  The only new finding is a  small atelectasis in the right middle lobe, which is off the lateral  segment. We will have to continue to follow this and will get a chest x-  ray in 2 months to re-evaluate this finding.  She had a recent URI but  no other reason to explain this.  Her blood pressure is 112/66; pulse  100; respirations 18; O2 saturations 97%.  I plan to see her back again  in 2 months.   Sincerely,   [name], M.D.  Electronically Signed   DPB/MEDQ  D:  08/04/2007  T:  08/05/2007  Job:  [id number]

## 2011-03-04 NOTE — Assessment & Plan Note (Signed)
Due for TSH and RF meds today.

## 2011-03-04 NOTE — Letter (Signed)
January 05, 2008   Seymour Bars, D.O.  [facility].  [address]  [place], [place] [address]   Re:  [name], [name]                DOB:  1945/12/08   Dear Dr. Cathey Endow:   I saw Ms. Hiers back with her CT scan, and it shows no evidence for  recurrence of her cancer and normal postoperative changes where we did  her left upper lobectomy. Her blood pressure was 129/74, pulse 75,  respirations 18, sats were 97%. She is doing well overall and has gained  some weight. We did sign a form for medical disability. I think  considering all that she has been through with the first and second  operations that she definitely is disabled, particularly from a  pulmonary standpoint, but she is doing much better than I thought in the  past. I will see her back again in 6 months now with a chest x-ray.   Ines Bloomer, M.D.  Electronically Signed   DPB/MEDQ  D:  01/05/2008  T:  01/05/2008  Job:  [id number]   cc:   Artist Pais Kathrynn Running, M.D.

## 2011-03-04 NOTE — Assessment & Plan Note (Signed)
OFFICE VISIT   Bui, Jazzmyne L  DOB:  1946-01-05                                        April 07, 2007  CHART #:  [id number]   Ms. Pedraza came for followup today.  Her blood pressure is 128/72,  pulse 72, respirations 18, saturation 96%.  She is almost completely off  oxygen.  Just rarely uses it for exertion.  Chest x-ray they read as no  significant change, although it is slightly improved.  She still has  that apical space with narrow fluid level, but this has decreased in  size.  The only thing changed in her symptoms is she is coughing more.  Is coughing up some clear liquid.  I gave her prescription for Tessalon  Perles and see her back in a month with another chest x-ray.  Should her  cough continue I may have to rebronchoscope her, but I really think that  she has had a well-healed bronchial stub and this is just chronic  inflammation, but given her past history will consider bronchoscopy if  her symptomatology continues.   Ines Bloomer, M.D.  Electronically Signed   DPB/MEDQ  D:  04/07/2007  T:  04/07/2007  Job:  [id number]

## 2011-03-04 NOTE — Progress Notes (Signed)
  Subjective:    Patient ID: [name], female    DOB: June 29, 1946, 65 y.o.   MRN: [id number]  HPI 65 yo WF presents for f/u visit.  Her mood is much improved on current meds - Pristiq and Seroquel.  She is due to have her fasting sugar and lipids repeated on Seroquel for 3-4 mos now.  No acute stressors.  Has only gained 2 lbs since last visit.  She feels bloated but says that her BMs are noemal.  Just had a colonoscopy about a yr ago.    BP 124/74  Pulse 74  Ht 5\' 4"  (1.626 m)  Wt 151 lb (68.493 kg)  BMI 25.92 kg/m2  SpO2 97%  Patient Active Problem List  Diagnoses  . THRUSH  . LUNG CANCER  . HYPOTHYROIDISM, POSTSURGICAL  . HYPERCHOLESTEROLEMIA  . ANEMIA, IRON DEFICIENCY NOS  . DEPRESSION, MAJOR, RECURRENT  . ANXIETY  . HYPERTENSION, BENIGN SYSTEMIC  . DISEASE, ISCHEMIC HEART, CHRONIC NOS  . PERIPHERAL VASCULAR DISEASE, UNSPEC.  Marland Kitchen COPD  . GERD      Review of Systems  Constitutional: Negative for fever, appetite change, fatigue and unexpected weight change.  Respiratory: Negative for cough and shortness of breath.   Cardiovascular: Negative for chest pain and leg swelling.  Gastrointestinal: Positive for abdominal distention. Negative for nausea, vomiting, abdominal pain, diarrhea, constipation and blood in stool.  Genitourinary: Negative for dysuria and urgency.  Neurological: Negative for weakness and headaches.  Psychiatric/Behavioral: Negative for suicidal ideas, sleep disturbance and dysphoric mood. The patient is not nervous/anxious.        Objective:   Physical Exam  Constitutional: She appears well-developed and well-nourished.  Eyes: Conjunctivae are normal.  Neck: Neck supple. No thyromegaly present.  Cardiovascular: Normal rate, regular rhythm and normal heart sounds.   No murmur heard. Pulmonary/Chest: Effort normal and breath sounds normal.  Abdominal: Soft. She exhibits distension (diffuse). She exhibits no mass. There is no tenderness. There is no  guarding.  Musculoskeletal: She exhibits no edema.  Lymphadenopathy:    She has no cervical adenopathy.  Skin: Skin is dry.  Psychiatric: She has a normal mood and affect.          Assessment & Plan:

## 2011-03-04 NOTE — Patient Instructions (Signed)
Xray abdomen today. Will call you w/ results this afternoon.  Update fasting labs one morning in the next wk downstairs. Will call you w/ results.  Meds RFd today.  Keep up the good work.  Return for f/u in 6 mos.

## 2011-03-07 NOTE — Op Note (Signed)
[name], [name]                ACCOUNT NO.:  [id number]   MEDICAL RECORD NO.:  [id number]          PATIENT TYPE:  INP   LOCATION:  2550                         FACILITY:  [facility]   PHYSICIAN:  Ines Bloomer, M.D. DATE OF BIRTH:  May 27, 1946   DATE OF PROCEDURE:  DATE OF DISCHARGE:                                 OPERATIVE REPORT   PREOPERATIVE DIAGNOSES:  Left upper lobe non-small-cell lung cancer.  Thyroid nodule, probable papillary carcinoma.   POSTOPERATIVE DIAGNOSES:  Non-small-cell lung cancer of left upper lobe.  Thyroid nodule, probable papillary carcinoma.   OPERATION PERFORMED:  Left mini thoracotomy, wedge resection of left upper  lobe lesion with seed implantation and node sampling.   SURGEON:  D.P. Edwyna Shell, M.D.   FIRST ASSISTANT:  Pecola Leisure, PA-C.   ANESTHESIA:  General.   This 65 year old patient has been smoking for a long time and had evidence  of emphysema.  Was found to have a left upper lobe mass and underwent a PET  scan which was positive, but there was also an area that was positive in the  left lobe of the thyroid.  Needle biopsy was done of that and showed  papillary carcinoma.  She is brought to the operating room for excision of  the left upper lobe lesion.  Because her FEV1 was about 0.8, it was decided  to do a wedge resection with node sampling rather than a lobectomy.  She had  quit smoking for approximately a week or so.   After general anesthesia, she was turned in the left lateral thoracotomy  position.  She was prepped and draped in the usual sterile manner.  A dual-  lumen tube was inserted.  Two trocar sites were made in the anterior  axillary line at the seventh intercostal space and in the mid axillary line  at the eighth intercostal space.  Two trocars were inserted.  The lesion was  seen in the lingular segment along the vertical fissure in the left upper  lobe.  A 5- to 7-cm incision was made over the triangle of auscultation  and  latissimus was partially divided and the serratus reflected anteriorly.  The  fifth intercostal space was entered, taking down the intercostal muscle to  protect the nerve.  A portion of the sixth rib was taken subperiosteally at  the angle.  A small __________  was inserted with minimal retraction.  Through that, the lesion was identified.  Then using the EZ45 stapler with  multiple applications, using Cingaurd reinforcement, the lesion was  resected, sent for frozen section which was a non-small-cell lung cancer;  margins were negative.  Two 11 nodes were taken out of the fissure and than  a 5 node from the AP window and a 10L node from around the left lower lobe  bronchus.  CoSeal was applied to the staple line.  Then the mesh was  prepared by Dr. Kathrynn Running and placed in the chest and was sutured in place at  the superior, inferior and right in the middle of the mesh to get maximum  coverage of the  staple line.  Additional sutures were placed in the four  quadrants to get a good coverage of the area.  These sutures were 3-0  Vicryl.  These had been placed and tied, the lateral ones being clipped and  the ones along the staple line tied down.   After this all had been done, two chest tubes were brought in through the  trocar sites and tied in place with 0 silk.  A Marcaine block was done in  the usual fashion.  An On-Q was placed in the usual fashion and secured in  place with Steri-Strips and Benzoin.  Then the chest was closed, putting  three drill holes through the sixth rib and then bringing the inner  pericostal up through the drill holes and then up around the border of the  fifth rib to minimize trauma to the nerve.  The intercostal muscle which had  been reflected out of the way was replaced.  These were tied down and then  the chest closed in layers with #1 Vicryl in the muscle layer, 2-0 Vicryl in  subcutaneous tissues and 3-0 Vicryl as a subcuticular stitch.  The patient   was returned to the recovery room in stable condition.           ______________________________  Ines Bloomer, M.D.     DPB/MEDQ  D:  03/12/2006  T:  03/12/2006  Job:  [id number]

## 2011-03-07 NOTE — H&P (Signed)
[name], BAUMBACH                ACCOUNT NO.:  [id number]   MEDICAL RECORD NO.:  [id number]          PATIENT TYPE:  INP   LOCATION:  NA                           FACILITY:  [facility]   PHYSICIAN:  Ines Bloomer, M.D. DATE OF BIRTH:  May 03, 1946   DATE OF ADMISSION:  03/04/2006  DATE OF DISCHARGE:  03/04/2006                                HISTORY & PHYSICAL   CHIEF COMPLAINT:  Left upper lobe mass.   HISTORY OF PRESENT ILLNESS:  This 65 year old patient was found to have a  left upper lobe mass as well as a 3 mm nodule in the right lower lobe.  The  left upper lobe mass was peripheral and approximately 1.5 cm in size.  A PET  scan was done which was positive in this area but is also positive in her  left lobe of her thyroid.  She had a needle biopsy done of her thyroid which  showed thyroid cancer, probable papillary.  Pulmonary function tests were  done showed an FVC of 1.43 or 48% of predicted and FEV1 of  0.69 or 29% of  predicted and a diffusion capacity of 83%.  She had some shortness of breath  with exertion.  She has finally quit smoking but has quit smoking a short  time ago.  The patient was seen by Dr. Kathrynn Running for possible seed  implantation.  We repeated her pulmonary function tests, and we got an FEV1  of 0.686 which is 35% of predicted with an FVC of 1.64 or 54% of predicted.  She came in with some shortness of breath and cough and was treated with a  prednisone taper.   MEDICATIONS:  1.  Advair discus 250/50 twice daily.  2.  Albuterol twice daily.  3.  Requip 0.5 mg daily.  4.  Spiriva daily.  5.  Lisinopril/hydrochlorothiazide 10/12.5 daily.  6.  Aspirin 81 mg daily.   PAST MEDICAL HISTORY:  1.  Depression.  2.  Hypercholesterolemia.  3.  Peripheral vascular disease.  4.  Hypertension.  5.  Restless leg syndrome.  6.  Previous myocardial infarction.  7.  Anxiety.   PAST SURGICAL HISTORY:  1.  She has had an appendectomy.  2.  She had a cardiac cath as  well as a stent in her left leg.  3.  Cholecystectomy.   FAMILY HISTORY:  Positive for cancer.  Her mother had endometrial cancer.  Father died of laryngeal cancer and hypertension.  There is a history of  diabetes in the family as well as breast cancer in a sister.   SOCIAL HISTORY:  She is married, has 4 children.  She is on __________to try  to quit smoking but as mentioned quit smoking just recently, does not drink  alcohol on a regular basis.   REVIEW OF SYSTEMS:  Her weight is stable.  CARDIAC:  See past medical  history.  No recent angina or atrial fibrillation.  PULMONARY:  See history  of present illness.  No hemoptysis.  GI:  No hiatal hernia, abdominal pain.  GU:  No frequent urination or kidney  disease.  VASCULAR:  See past medical  history.  She has had previous claudication but no DVT or TIAs.  NEUROLOGIC:  No headaches, black-out, seizures.  ORTHOPEDICS:  No joint pain.  PSYCHIATRIC:  See past medical history.  ENT:  No change in her eyesight or  hearing.  HEMATOLOGICAL:  No problems with anemia.   PHYSICAL EXAMINATION:  GENERAL:  She is a thin Caucasian female in no acute  distress.  VITAL SIGNS:  Blood pressure is 122/62, pulse 88, respirations 18,  saturations are 97%.  HEAD:  Atraumatic.  EYES:  Pupils equal, react to light and accommodation.  Extraocular  movements are normal.  EARS:  Tympanic membrane intact.  NOSE:  There is no septal deviation.  THROAT:  Without lesions.  NECK:  There is no supraclavicular or axillary adenopathy.  No carotid  bruits.  No thyromegaly.  I could not palpate a nodule on the left thyroid  CHEST:  There is some bilateral wheezing, decreased breath sounds.  HEART:  Regular sinus rhythm.  No murmurs.  ABDOMEN:  Soft.  There is no hepatosplenomegaly.  EXTREMITIES:  Pulses are 2+.  There is no clubbing or edema.  NEUROLOGIC:  She is oriented x3.  Cranial nerves 2-12 are intact, sensory  and motor intact.   IMPRESSION:  1.  Left  upper lobe mass, rule out cancer.  2.  Chronic obstructive pulmonary disease.  3.  Tobacco abuse.  4.  Previous myocardial infarction.  5.  Hypercholesterolemia.  6.  History of anxiety.   PLAN:  Left VATS wedge resection of left upper lobe lesion with seed  implantations.           ______________________________  Ines Bloomer, M.D.     DPB/MEDQ  D:  03/10/2006  T:  03/10/2006  Job:  [id number]

## 2011-03-07 NOTE — Discharge Summary (Signed)
NAMEBENNYE, [name]                ACCOUNT NO.:  [id number]   MEDICAL RECORD NO.:  [id number]          PATIENT TYPE:  INP   LOCATION:  3315                         FACILITY:  [facility]   PHYSICIAN:  Ines Bloomer, M.D. DATE OF BIRTH:  1946-06-20   DATE OF ADMISSION:  11/30/2006  DATE OF DISCHARGE:  12/08/2006                               DISCHARGE SUMMARY   HISTORY OF PRESENT ILLNESS:  The patient is a 65 year old female with a  history of nonsmall cell lung cancer, status post left upper lobe wedge  resection and seed implantation on Mar 12, 2006.  She initially did  well.  She did have positive lymph nodes and her lesion was a T2 with  pleural involvement, so she underwent a course of chemotherapy.  She had  a CT scan in October of 2007, which showed a cystic area in the left  upper lobe.  This was repeated in 2008 and this was felt, at that time,  to be consistent with a chronic bronchopleural fistula.  She was  admitted this hospitalization for completion lobectomy.  It is noted  that candida was cultured from the cavity and she has been on Diflucan.   PAST MEDICAL HISTORY:  Remarkable for:  1. Hypertension.  2. Dyslipidemia.  3. Depression.  4. Peripheral vascular disease.  5. Coronary artery disease.  6. Restless leg syndrome.  7. Anxiety.   PAST SURGICAL HISTORY:  Includes:  1. Appendectomy.  2. Thyroid lobectomy.  3. Cholecystectomy.   MEDICATIONS PRIOR TO ADMISSION:  Included Advair Diskus, albuterol,  Requip 0.5 mg daily, Spiriva, lisinopril/hydrochlorothiazide and aspirin  per Dr. Scheryl Darter history and physical, the dosages are not listed.   FAMILY HISTORY:  Please see the history and physical done at the time of  admission.   SOCIAL HISTORY:  Please see the history and physical done at the time of  admission.   REVIEW OF SYSTEMS:  Please see the history and physical done at the time  of admission.   PHYSICAL EXAMINATION:  Please see the history and physical  done at the  time of admission.   ALLERGIES:  INCLUDE PENICILLIN.   HOSPITAL COURSE:  Patient was admitted and on November 30, 2006, she was  taken to the operating room and underwent a left thoracotomy with left  upper lobectomy by Dr. Edwyna Shell.  Findings in the postoperative diagnosis  are stated as destroyed left upper lobe with chronic bronchopleural  fistula.   POSTOPERATIVE HOSPITAL COURSE:  Patient, overall, has done well.  She  has remained hemodynamically stable.  All routine lines, monitors and  drainage devices have been discontinued in the standard fashion.  She  did have hyponatremia, but this has improved over time.  Her most recent  electrolytes, on December 07, 2006, show her sodium to be 135.  Her  incision is healing well without evidence of infection.  She has  tolerated a gradual increase in activity, commenced at the level of  postoperative convalescence, but will require home oxygen as her  saturations warrant this.  Her culture grow a Neisseria.  She has  been  treated with a course of Avalox and this will continue at home for an  additional 5 days.  Additionally, she has continued her Diflucan  throughout the hospitalization and will continue another 3 days, as  well,l at home.  Her overall status, upon evaluation by Dr. Edwyna Shell on  December 08, 2006, is felt to be adequate for discharge.   CONDITION ON DISCHARGE:  Stable and improving.   FINAL DIAGNOSIS:  Chronic bronchopleural fistula, now status post  completion left upper lobectomy as described.   OTHER DIAGNOSES:  Include:  1. Postoperative hyponatremia, resolved.  2. Hypertension.  3. Hyperlipidemia.  4. Depression.  5. Peripheral vascular disease.  6. Coronary artery disease.  7. Restless leg syndrome.  8. Emphysema.  9. Anxiety.   INSTRUCTIONS:  The patient received written instructions in regard to  medications, activity, diet, wound care and followup.   FOLLOWUP:  Will include Dr. Edwyna Shell  appointment on Tuesday, February 26  at 3:30 with a chest x-ray from [facility] at that time.      Rowe Clack, P.A.-C.      Ines Bloomer, M.D.  Electronically Signed    WEG/MEDQ  D:  12/08/2006  T:  12/09/2006  Job:  [id number]

## 2011-03-07 NOTE — Telephone Encounter (Signed)
Pt aware of the above  

## 2011-03-07 NOTE — Consult Note (Signed)
[name], [name]                ACCOUNT NO.:  [id number]   MEDICAL RECORD NO.:  [id number]          PATIENT TYPE:  INP   LOCATION:  3301                         FACILITY:  [facility]   PHYSICIAN:  Lajuana Matte, MD  DATE OF BIRTH:  03/14/1946   DATE OF CONSULTATION:  03/17/2006  DATE OF DISCHARGE:                                   CONSULTATION   REASON FOR CONSULTATION:  A 65 year old white female diagnosed with non-  small-cell lung cancer for evaluation and discussion of adjuvant therapy.   HISTORY:  [name] is a very pleasant 65 year old white female who was  recently visiting [place] and had to stay in the house that has air  pollution and smoking.  The patient developed upper respiratory infection,  and when she came back home, she was seen by her primary care physician in  [place] who ordered a chest x-ray which was suspicious for left upper  lobe lung nodule. This was followed by CT scan of the chest which again  revealed 1.5 cm left upper lobe nodule.  There was also questionable 3 mm  nodule in the right lower lobe.  The patient was then referred to Dr. Edwyna Shell  who ordered a PET scan, and it was performed on February 13, 2006, and again  showed hypermetabolic left upper lobe lung mass with a 1.0 cm left thyroid  nodule that has increased SUV uptake.  Fine needle aspiration of the left  thyroid nodule was performed on Mar 04, 2006, and it revealed atypia  suspicious for papillary thyroid carcinoma.   On Mar 12, 2006, the patient underwent left upper lobe wedge resection with  seed implants under the care of Dr. Edwyna Shell and Dr. Kathrynn Running. The pathology of  this resection revealed a 1.5 cm poorly differentiated adenocarcinoma with  pleura but no lymph node involvement. Dr. Edwyna Shell kindly asked me to see the  patient today for evaluation and discussion of further treatment options.   When seen, she was accompanied by her husband.  The patient is doing fine,  recovering  well from her surgery except for mild chest discomfort on the  left side with mild shortness of breath.  Otherwise, she is healing well.   REVIEW OF SYSTEMS:  Today, she has no fever, no chills, headache, blurry  vision, double vision.  Continues to have the mild left-sided chest pain  with mild shortness of breath.  No cough, hemoptysis, syncope, or  palpitations.  No nausea, vomiting.  No abdominal pain, diarrhea,  constipation, melena, hematochezia.  No dysuria, hematuria, urgency or  urinary frequency.   PAST MEDICAL HISTORY:  Significant for:  1.  Hypertension.  2.  Dyslipidemia.  3.  Depression.  4.  Peripheral vascular disease.  5.  Coronary artery disease status post myocardial infarction.  6.  Restless leg syndrome.  7.  Anxiety.  8.  Status post appendectomy.  9.  Status post cholecystectomy.   FAMILY HISTORY:  Mother had endometrial, throat, and lung cancers.  Father  had throat and prostate cancer.  Sister with breast cancer.   SOCIAL HISTORY:  She is married, has 4 children, 2 boys and 2 girls.  She  used to work as a Conservation officer, nature.  She has a history of smoking, quit 2 months ago  and currently on Chantix.  The patient drinks alcohol occasionally.  No  history of drug abuse.   ALLERGIES:  She is allergic to PENICILLIN that caused hives and rash.   CURRENT MEDICATIONS:  Include Xanax, aspirin, Dulcolax, Lisinopril,  Lopressor, Chantix, Percocet, Ultram, and albuterol.   PHYSICAL EXAMINATION:  VITAL SIGNS: Blood pressure 96/46, pulse 90,  respiratory rate 20, temperature 97.2, oxygen saturation 95% on room air.  GENERAL: Exam showed very pleasant 65 year old white female, alert and  oriented and in no acute distress.  HEENT: Normocephalic and atraumatic.  Clear oropharynx.  NECK: Supple.  No lymphadenopathy.  CHEST: Clear to auscultation on the right with decreased breath sounds and a  few wheezes on the left.  CARDIOVASCULAR:  Normal S1 and S2. No murmur or gallop.   ABDOMEN:  Soft, nontender. No masses, distention, or edema.  NEUROLOGIC: No focal sensory or motor deficits.   LABORATORY DATA:  CBC and CMET on Mar 16, 2006, showed white blood count  5.3, hemoglobin 10.2, hematocrit 29.4, platelets 263.  Sodium 130, potassium  4, BUN 6, creatinine 0.4, blood glucose 113.   ASSESSMENT AND PLAN:  This is a very pleasant 65 year old white female who  was recently diagnosed with two cancers.  The first one is a Stage IB (T2,  N0, M0) non-small-cell lung cancer adenocarcinoma involving the left upper  lobe status post left upper lobe wedge resection with seed implants.  The  second cancer is in the form of left thyroid papillary carcinoma.   PLAN:  For the management of the lung cancer, there is no 5-year survival  benefit for adjuvant chemotherapy for Stage IB non-small-cell lung cancer,  especially with tumor the size less than 4 cm.  My recommendation at this  point is to continue the patient on observation only with repeat CT scan  initially every 3 months during the first year.   For the Thyroid carcinoma, the patient was already seen by Dr. Darnell Level,  from [facility], and he will plan for surgical resection of  this tumor within a few weeks.  I will arrange for the patient a followup  appointment with me at the [facility] for more detailed  discussion of her options and also to arrange for all the scans on 3 to 6  monthly basis as needed.   Thank you so much for allowing me to participate in the care of Ms. [name].      Lajuana Matte, MD  Electronically Signed     MKM/MEDQ  D:  03/17/2006  T:  03/17/2006  Job:  [id number]   cc:   [name], M.D.  [address]  [place]  [place] [address]   Velora Heckler, MD  [address]  [place]  [place] [address]   Artist Pais. Kathrynn Running, M.D.  Fax: [phone]

## 2011-03-07 NOTE — Op Note (Signed)
NAMEAVILENE, MARRIN                ACCOUNT NO.:  [id number]   MEDICAL RECORD NO.:  [id number]          PATIENT TYPE:  INP   LOCATION:  2315                         FACILITY:  [facility]   PHYSICIAN:  Ines Bloomer, M.D. 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   POSTOPERATIVE DIAGNOSIS:  Destroyed left upper lobe with chronic  bronchopleural fistula.   OPERATION PERFORMED:  1. Left thoracotomy.  2. Left upper lobectomy.   SURGEON:  Dr. Dorita Sciara.   FIRST ASSISTANT:  Dr. Ofilia Neas and Shonna Chock, P.A.-C.   HISTORY:  A 65 year old patient who had a previous wedge resection, seed  implantation for non-small-cell lung cancer and chronic obstructive  pulmonary disease and tobacco abuse.  She did well initially, but it was  noted on her three to four month scan that she developed a cavitary area  in the left upper lobe in the area where the seeds were implanted.  She  then started developing a cough and another CT scan done three months  later showed enlargement of the left upper lobe and her cough symptoms  increased significantly.  She was brought to the operating room,  underwent bronchoscopy and on bronchoscopy we were able to pass a  bronchial brush out the left upper lobe bronchus and into the cavity,  indicative of a bronchopleural fistula.  She was brought to the  operating room for resection of the bronchopleural fistula.   DESCRIPTION OF PROCEDURE:  After general anesthesia, the patient was  turned to the left lateral thoracotomy position, was prepped and draped  in the usual sterile manner.  The previous thoracotomy incision was  opened and extended proximally and distally.  The latissimus was divided  and its radius was reflected anteriorly.  Then the fifth rib was taken  subpericostally from  the angle up above the anterior axillary line.  The  left cavity was entered, there was some space anteriorly and the lung  was dissected off the chest wall with electrocautery.  The entire cavity  was just plastered in with adhesions with very thick adhesions in the  left upper lobe and a part was taken from the left lower lobe to the  chest wall.  We first started taking down this thick adhesion with extra  pulley with electrocautery and going superiorly.  We were able to take  down enough until we entered this large cavity and it had a lot of  necrotic lung in it, as well as pus.  A gram stain revealed no  organisms.  We sent it for routine fungus AFB and anaerobic culture.  The patient had previously cultured candida out of this area, so she was  started on Diflucan, Avelox, as well as her vancomycin.  The left upper  lid was taken off the chest wall with electrocautery taking it down off  the wall laterally and then going posteriorly and dissecting it off the  chest wall and then off the apex.  Then medially it was dissected off  the middle mediastinum and the pericardium.  Attention was then turned  to the left lower lobe and we dissected the left lower lobe off the  chest wall.  There were several rents in the pleura in the left lower  lobe.  We then dissected off the diaphragm with electrocautery and  divided the inferior pulmonary ligament.  After this had been done and  the lung was completely freed up we turned our attention to the hilum  and dissected the hilum up dissecting out the pulmonary artery and then  dissecting out the superior pulmonary vein.  There were multiple large  lymph nodes, __________ nodes that were around the bronchus, which were  dissected free.  The cavity was really between the right upper lobe  where the wedge had been done and the inferior wall was the fissure on  the left lower lobe.  We then dissected up the apical posterior branch  of the left upper  lobe and that was divided with the Auto Suture 30 wide  Roticulator and the superior pulmonary vein was divided with the Auto  Suture 30 wide Roticulator.  We partially divided the fissure with the  EZ 45 stapler and there were several more lymph nodes.  We then  identified the area where the bronchus was and carefully dissected the  bronchus up and found the area of where the bronchopleural fistula and  using EZ 45 stapler we resected the rest of the left upper lobe and sent  it for permanent section.  We debrided the bronchus up and then closed  the left upper lobe bronchus with an interrupted 4-0 Prolene.  No leak  was seen from this.  There was some leak from the left lower lobe  pleural tears.  The lung did expand and the serratus anterior was  identified and the serratus anterior was taken down with electrocautery  taking it off the chest wall superiorly, inferiorly, medially and  laterally.  It was then brought down to the bronchial stump and sutured  to the bronchial stump with 3-0 Prolene creating a nice coverage of this  stump.  Two chest tubes were brought in through separate stab wounds and  tied in place with 2-0 silk.  Marcaine block was then used in the usual  fashion, the single On-Q was placed subpericostally just above the ribs  and secured in place with Steri-Strips.  The chest was closed with 6  pericostal drilled into the sixth rib and wrapping around the first rib,  #1 Vicryl in the muscle layer, 2-0 Vicryl in the subcutaneous tissue and  Ethicon skin clips.  The patient was returned to the recovery room in  serious condition.      Ines Bloomer, M.D.  Electronically Signed     DPB/MEDQ  D:  11/30/2006  T:  12/01/2006  Job:  [id number]

## 2011-03-07 NOTE — Discharge Summary (Signed)
[name], [name]                ACCOUNT NO.:  [id number]   MEDICAL RECORD NO.:  [id number]          PATIENT TYPE:  INP   LOCATION:  3301                         FACILITY:  [facility]   PHYSICIAN:  Ines Bloomer, M.D. DATE OF BIRTH:  12-30-1945   DATE OF ADMISSION:  03/12/2006  DATE OF DISCHARGE:  03/18/2006                                 DISCHARGE SUMMARY   ADMIT DIAGNOSIS:  Left upper lobe mass.   PAST MEDICAL HISTORY AND DISCHARGE DIAGNOSES:  1.  Chronic obstructive pulmonary disease.  2.  Depression.  3.  Hypercholesterolemia.  4.  Peripheral vascular disease.  5.  Hypertension.  6.  Restless leg syndrome.  7.  Previous myocardial infarction.  8.  Anxiety.  9.  Appendectomy.  10. Cardiac catheterization.  11. Stent left lower extremity.  12. Cholecystectomy.  13. Newly diagnosed thyroid cancer, probably papillary followed by Dr. Darnell Level.  14. Left upper lobe mass status post left video-assisted tomoscopic surgery,      mini thoracotomy in left upper lobe wedge and radiation seed      implantation with pathology revealing stage 1B, T2, N0, N0 nonsmall cell      lung cancer adenocarcinoma.   BRIEF HISTORY:  The patient is a 65 year old Caucasian female who presented  to her primary care physician with an upper respiratory infection.  A chest  x-ray revealed a left upper lobe mass, as well as a 3 mm nodule in the right  lower lobe.  The patient was then referred to Dr. Edwyna Shell for further  evaluation.  A PET scan was done, which was positive in this area, but also  positive and the left lobe of her thyroid.  A needle biopsy was performed of  the thyroid and this revealed thyroid cancer, probably papillary.  The  patient did complain of some shortness of breath with exertion and has just  recently quit smoking.  She was then referred to Dr. Kathrynn Running for possible  seed implantation.  After evaluation by both Dr. Edwyna Shell and Dr. Kathrynn Running, it  was their opinion that the  patient should proceed with surgical resection  and seed implantation.   HOSPITAL COURSE:  The patient was admitted and taken to the [facility] are on Mar 12, 2006 for a left video-assisted tomoscopic surgery, mini thoracotomy,  left upper lobe wedge resection, lymph node biopsy and radiation seed  implantation.  The patient tolerated the procedure well and was  hemodynamically stable immediately postoperatively.  The patient was  transferred from the oh are to the post anesthesia care unit in stable  condition.  The patient was extubated without complication and woke up from  anesthesia neurologically intact.   The patient's postoperative course has progressed as expected.  On  postoperative day 1, she was afebrile with stable vital signs.  Her chest x-  ray was stable and 1 of her chest tubes was discontinued in a routine  manner.  She was then transferred to unit 3300 without difficulty.  At that  time, she was noted to have no air leak.  However, on postoperative day 2,  the patient was noted to have an air leak and therefore her chest tube was  left to suction.  Her pain was well controlled.  The remainder of the  patient's postoperative course has been dedicated to a small left apical  pneumothorax that is noted on chest x-ray, which is stable, as well as a  persistent air leak.  On postoperative day 3, the patient's chest tube was  taken off suction and she was switched to a Mini Express 500.   FINAL PATHOLOGY:  A stage 1C, T2, N0, N0 nonsmall cell lung cancer of  adenocarcinoma.  Secondary to these findings, Dr. Arbutus Ped of the oncology  service was consulted.  He evaluated the patient on Mar 17, 2006 and it was  his opinion that there was no survival benefit from adjuvant chemotherapy in  a stage 1B nonsmall cell lung cancer with a tumor size less than 4 cm.  He,  therefore, recommended routine follow up with chest CT every 3 months during  the first year after surgery.  He also agreed  with Dr. Gerrit Friends that surgical  resection of the thyroid tumor was in her best interest.  That will be  arranged as an outpatient with Dr. Gerrit Friends for a later time.   On Mar 18, 2006, the patient was without complaint with the exception of  coughing.  It is productive of clear sputum and she is moving it easily.  She is afebrile with stable vital signs and maintaining a normal sinus  rhythm.  Her chest x-ray reveals a stable small left apical pneumothorax.  There is no drainage at present from her chest tube.  On physical exam,  cardiac has a regular rate and rhythm.  The lungs revealed decreased breath  sounds in the bases and the incision is clean, dry and intact.  An air leak  cannot be assessed in the Mini Express at this time, as there is no fluid  present.  The patient is in stable condition at this time and as long as she  continues to progress in the current [facility], she will be ready for discharge  within the next 1-2 days, pending morning round reevaluation.  She will be  discharged home with the Mini Express in place.   LABORATORY:  CBC and BMP on Mar 18, 2006:  White count 5.9, hemoglobin 10.6,  hematocrit 30.2 and platelets are 51.  Sodium 134, potassium 4.1, BUN six,  creatinine 0.6 and glucose 102.   CONDITION ON DISCHARGE:  Stable.   INSTRUCTIONS:  1.  DIET:  No restrictions.  2.  ACTIVITY:  The patient should continue daily breathing and walking      exercises.  3.  WOUND CARE: The patient may shower daily and clean the incisions with      soap and water. If wound problems arise, the patient should contact the      CVTS office at [phone].  4.  MEDICATIONS:      1.  Tylox 1-2 q.4-6h. p.r.n. pain.      2.  Lopressor 25 mg b.i.d.      3.  Advair discus 250 per 50 b.i.d.      4.  Albuterol b.i.d.      5.  Requip 0.5 mg daily.      6.  Spiriva daily.      7.  Aspirin 325 mg daily.      8.  Xanax 0.25 mg t.i.d.  1.  Chantix 1 mg b.i.d. 5.  FOLLOW UP  APPOINTMENT:      1.  Dr. Edwyna Shell on March 25, 2006 at 1:10 p.m.      2.  [facility] for PA and lateral chest x-ray on June 6 at 12:10          p.m.      3.  Dr. Arbutus Ped and he will arrange a follow up appointment for 1 month          after discharge.      4.  Dr. Gerrit Friends.  The patient will be instructed to contact his office          for an appointment in approximately 4 weeks after discharge.      Pecola Leisure, PA    ______________________________  Ines Bloomer, M.D.    AY/MEDQ  D:  03/18/2006  T:  03/18/2006  Job:  [id number]   cc:   Lajuana Matte, MD  Fax: [phone]   Velora Heckler, MD  [phone] [place]  [place] [address]

## 2011-03-07 NOTE — H&P (Signed)
[name], [name]                ACCOUNT NO.:  [id number]   MEDICAL RECORD NO.:  [id number]          PATIENT TYPE:  INP   LOCATION:  NA                           FACILITY:  [facility]   PHYSICIAN:  Ines Bloomer, M.D. DATE OF BIRTH:  07-09-1946   DATE OF ADMISSION:  DATE OF DISCHARGE:                              HISTORY & PHYSICAL   HISTORY AND CHIEF COMPLAINT:  Chronic cough.   HISTORY OF PRESENT ILLNESS:  This 65 year old female was diagnosed as  having non-small-cell lung cancer, left upper lobe, and on Mar 12, 2006  underwent a wedge resection of the left upper lobe with seed  implantation at the time of surgery.  Because of her severe emphysema,  we did seem guard reinforcements.  She initially did well.  Unfortunately, she had positive lymph nodes.  Initially, she had a T-2  lesion with pleural involvement, so she underwent chemotherapy.  She did  well and also had a thyroid lobectomy in August of 2007.  In October  2007, a CT scan for followup showed a cystic area in the left upper  lobe.  This was repeated in January of 2008 and it again showed more  loss of the left upper lobe with increase in the cystic area.  A  bronchoscopy was done which revealed that this is probably a chronic  bronchopleural fistula.  She recently has had a chronic cough.  Candida  was cultured out of the cavity and she has been started on Diflucan.  She is being readmitted for probable completion of left upper lobectomy  and closure of the bronchopleural fistula.   PAST MEDICAL HISTORY:  Significant for:  1. Hypertension.  2. Dyslipidemia.  3. Depression.  4. Peripheral vascular disease.  5. Coronary artery disease with a myocardial infarction.  6. Restless legs syndrome.  7. Anxiety.   She has had an appendectomy and a cholecystectomy.   MEDICATIONS:  Include Advair Diskus, albuterol, Requip 0.5 mg a day,  Spiriva, lisinopril/hydrochlorothiazide, and aspirin.   FAMILY HISTORY:  Is  positive for endometrial and lung cancer, and her  father had prostate cancer, and her sister had breast cancer.   SOCIAL HISTORY:  She is married, has 4 children, 2 boys, 2 girls, works  as a Conservation officer, nature, quit smoking approximately 6 months ago, occasional  alcohol intake.   She is ALLERGIC to PENICILLIN.   PHYSICAL EXAM:  VITAL SIGNS:  Her blood pressure is 100/60, pulse 80,  saturating 92%, and respirations were 18.  GENERAL:  She is a well-developed Caucasian female with a chronic cough.  HEENT:  Head:  Atraumatic.  Eyes:  Pupils equal, react to light and  accommodation.  Extraocular movements are normal.  Ears:  Tympanic  membranes intact.  NECK:  Supple, no thyromegaly.  CHEST:  Clear to auscultation and percussion on the right, and there is  decreased breath sounds in the left upper lobe.  There is a left  thoracotomy scar.  HEART:  Regular sinus rhythm, no murmurs.  ABDOMEN:  Soft.  There is no hepatosplenomegaly.  Bowel sounds are  normal.  NEUROLOGICAL:  Intact.  She is oriented x3.  Sensory and motor intact.  SKIN:  Without lesion.  EXTREMITIES:  Pulse are 2+.  There is no clubbing or edema.   IMPRESSION:  1. Chronic bronchopleural fistula secondary to a wedge resection of      the left upper lobe.  2. Chronic obstructive pulmonary disease.  3. A history of thyroid nodule with resection.  4. Hypertension.  5. Restless legs syndrome.  6. Peripheral vascular disease.  7. A history of depression and anxiety.   PLAN:  Completion of left upper lobectomy with closure of bronchopleural  fistula.      Ines Bloomer, M.D.  Electronically Signed     DPB/MEDQ  D:  11/25/2006  T:  11/26/2006  Job:  [id number]

## 2011-03-07 NOTE — Op Note (Signed)
[name], [name]                ACCOUNT NO.:  [id number]   MEDICAL RECORD NO.:  [id number]          PATIENT TYPE:  OIB   LOCATION:  5704                         FACILITY:  [facility]   PHYSICIAN:  Velora Heckler, MD      DATE OF BIRTH:  1945/10/31   DATE OF PROCEDURE:  06/18/2006  DATE OF DISCHARGE:                                 OPERATIVE REPORT   PREOPERATIVE DIAGNOSIS:  Left thyroid nodule.   POSTOPERATIVE DIAGNOSIS:  Left thyroid nodule.   PROCEDURE:  Left thyroid lobectomy.   SURGEON:  Velora Heckler, MD, FACS   ASSISTANT:  Leonie Man, M.D.   ANESTHESIA:  General per Guadalupe Maple, M.D.   ESTIMATED BLOOD LOSS:  Minimal.   PREPARATION:  Betadine.   COMPLICATIONS:  None.   INDICATIONS:  The patient is a 65 year old white female from [place],  [place].  She was diagnosed with lung cancer in the spring of 2007.  She underwent left upper lobectomy.  PET scan staging; however, revealed an  abnormal focus of activity in the left thyroid lobe.  The patient underwent  ultrasound and fine needle aspiration biopsy in May 2007 showing atypia with  nuclear grooves and pseudo inclusions.  Hurthle cell change was identified.  The patient now comes to surgery for thyroid lobectomy.   BODY OF REPORT:  Procedure is done in OR #10 at the [facility].  The patient was brought to the operating room and placed in the supine  position on the operating room table.  Following administration of general  anesthesia, the patient is positioned and then prepped and draped in the  usual strict aseptic fashion.  After ascertaining that an adequate level of  anesthesia had been obtained, a Kocher incision is made with a #15 blade.  Dissection was carried through subcutaneous tissues and platysma.  Hemostasis was obtained with the electrocautery.  Skin flaps were elevated  cephalad and caudad from the thyroid notch to the sternal notch.  A Mahorner  self-retaining retractor  was placed for exposure.  Strap muscles were  incised in the midline.  Strap muscles were reflected to the left.  Left  thyroid lobe was exposed.  Venous tributaries are divided between small  Ligaclips. Superior pole vessels are dissected out, ligated in continuity  with 2-0 silk ties, and medium Ligaclips, and divided.  Gland was rolled  anteriorly.  Parathyroid tissue was identified and preserved.  Branches of  the inferior thyroid artery are divided between small Ligaclips.  Recurrent  nerve was identified and preserved.  Ligament of Allyson Sabal is transected with  the electrocautery; and the gland is rolled up and onto the anterior  trachea.  It is mobilized across the midline.  The isthmus is transected at  its junction with the right thyroid lobe between hemostats.  It is suture  ligated with 3-0 Vicryl suture ligatures.  Left thyroid lobe is submitted to  pathology.  Dr. Guerry Bruin performed frozen section analysis.  He does see  a follicular lesion, but is unable to identify any overt signs of  malignancy.  Lesion measures 1 cm or less in size.   Examination of the right thyroid lobe shows it to be small and relatively  normal.  There are no dominant nodules or masses.  There are no gross  abnormalities.  There is no palpable lymphadenopathy.  Left neck is  irrigated.  Surgicel is placed over the recurrent nerve and parathyroid  glands.  Strap muscles were reapproximated in the midline with interrupted 3-  0 Vicryl sutures.  Platysma was closed with interrupted 3-0 Vicryl sutures.  Skin is closed with a running 4-0 Vicryl subcuticular suture.  Wound is  washed and dried.  Benzoin and Steri-Strips are applied.  Sterile dressings  are applied.  The patient is awakened from anesthesia; and brought to the  recovery room in stable condition.  The patient tolerated the procedure  well.      Velora Heckler, MD  Electronically Signed     TMG/MEDQ  D:  06/18/2006  T:  06/19/2006  Job:   [id number]   cc:   Ines Bloomer, M.D.  Seymour Bars, D.O.

## 2011-03-07 NOTE — Op Note (Signed)
NAMEJASARA, [name]                ACCOUNT NO.:  [id number]   MEDICAL RECORD NO.:  [id number]          PATIENT TYPE:  AMB   LOCATION:  SDS                          FACILITY:  [facility]   PHYSICIAN:  Ines Bloomer, M.D. DATE OF BIRTH:  1946/09/17   DATE OF PROCEDURE:  DATE OF DISCHARGE:  11/12/2006                               OPERATIVE REPORT   PREOPERATIVE DIAGNOSES:  1. Status post wedge resection left upper lobe nodule.  2. Enlarging left apical cavity.   POSTOPERATIVE DIAGNOSES:  1. Status post wedge resection left upper lobe nodule.  2. Enlarging left apical cavity.  3. 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   SURGEON:  Ines Bloomer, M.D..   ANESTHESIA:  Xylocaine 1% anesthesia.   DESCRIPTION OF PROCEDURE:  After percutaneous insertion of all monitor  lines, the patient underwent local anesthesia with Cetacaine, Xylocaine,  and IV sedation.  The video bronchoscope was passed through the mouth  and the carina was in the midline of the right upper lobe.  The right  middle lobe orifice was normal.  The left main stem and the left lower  lobe orifice were normal with the left upper lobe orifice almost  completely occluded.  With extrinsic pressure, biopsies were taken of  this area and then, under fluoro, we passed a brushing into the bronchus  and you could see it going out into the cavity indicative of probable  bronchopleural fistula.  Also some yellowish material came out of the  cavity, and this was sent for culture and cytology.  The video  bronchoscope was removed.  The patient returned to the recovery room in  stable condition.      Ines Bloomer, M.D.  Electronically Signed     DPB/MEDQ  D:  11/12/2006  T:  11/12/2006  Job:  [id number]

## 2011-03-10 LAB — LIPID PANEL
Cholesterol: 212 mg/dL — ABNORMAL HIGH (ref 0–200)
HDL: 70 mg/dL (ref >39)
LDL Cholesterol: 123 mg/dL — ABNORMAL HIGH (ref 0–99)
Total CHOL/HDL Ratio: 3 Ratio
Triglycerides: 95 mg/dL (ref <150)
VLDL: 19 mg/dL (ref 0–40)

## 2011-03-10 LAB — TSH: TSH: 3.463 u[IU]/mL (ref 0.350–4.500)

## 2011-03-10 LAB — GLUCOSE, RANDOM: Glucose, Bld: 89 mg/dL (ref 70–99)

## 2011-03-11 ENCOUNTER — Telehealth: Payer: Self-pay | Admitting: Family Medicine

## 2011-03-11 MED ORDER — LEVOTHYROXINE SODIUM 50 MCG PO TABS: 50.0000 ug | ORAL_TABLET | Freq: Every day | ORAL | Status: DC

## 2011-03-11 NOTE — Telephone Encounter (Signed)
Pls let pt know that her fasting sugar looks great.  Cholesterol is at goal.  We have room to increase her levothyroxine, so I am going to raise her to 50 mcg once daily.  New RX will be sent to pharmacy.  Recheck TSH only in 8 wks (lab only).

## 2011-03-14 ENCOUNTER — Other Ambulatory Visit: Payer: Self-pay | Admitting: Family Medicine

## 2011-03-14 NOTE — Telephone Encounter (Signed)
Pt notified with her lab results.  Told to increase her levothyroxine to 50 mcg daily, and the new Rx was sent to her pharmacy [facility].  Told pt to recheck her TSH level for lab only in 8 weeks.  LMOM with specific instructions, Jarvis Newcomer, LPN [facility]

## 2011-03-14 NOTE — Telephone Encounter (Signed)
Pt called and said her thyroid pills had been changed and she wants to know why.  Ask for a call.  There is a telephone note where the triage nurse called the patient today to let her know her thyroid medcation had been increased to 50 mcg, and that a script would be sent to her pharmacy.  Called pharmacist since I could not get a hold of pt and they will let pt know the dose was indeed increased by the provider and to repeat TSH  In 8 weeks. Jarvis Newcomer, LPN [facility]

## 2011-04-08 ENCOUNTER — Other Ambulatory Visit: Payer: Self-pay | Admitting: Family Medicine

## 2011-04-16 ENCOUNTER — Encounter: Payer: Self-pay | Admitting: Critical Care Medicine

## 2011-04-17 ENCOUNTER — Ambulatory Visit (INDEPENDENT_AMBULATORY_CARE_PROVIDER_SITE_OTHER): Payer: Medicare Other | Admitting: Critical Care Medicine

## 2011-04-17 ENCOUNTER — Encounter: Payer: Self-pay | Admitting: Critical Care Medicine

## 2011-04-17 VITALS — BP 140/78 | HR 78 | Temp 98.1°F | Ht 64.0 in | Wt 150.0 lb

## 2011-04-17 DIAGNOSIS — J449 Chronic obstructive pulmonary disease, unspecified: Secondary | ICD-10-CM

## 2011-04-17 DIAGNOSIS — C3490 Malignant neoplasm of unspecified part of unspecified bronchus or lung: Secondary | ICD-10-CM

## 2011-04-17 MED ORDER — PREDNISONE 10 MG PO TABS: ORAL_TABLET | ORAL | Status: DC

## 2011-04-17 MED ORDER — TIOTROPIUM BROMIDE MONOHYDRATE 18 MCG IN CAPS: 18.0000 ug | ORAL_CAPSULE | Freq: Every day | RESPIRATORY_TRACT | Status: DC

## 2011-04-17 NOTE — Assessment & Plan Note (Signed)
Golds III COPD s/p RUL resection for lung ca,  No recurrence on 2/12 CT Chest  Plan No change in inhaled or maintenance medications. Return in   4 months

## 2011-04-17 NOTE — Patient Instructions (Signed)
No change in medications. Return in         4 months 

## 2011-04-17 NOTE — Progress Notes (Signed)
Subjective:    Patient ID: [name], female    DOB: 1945-12-30, 65 y.o.   MRN: [id number]  HPI 65 year old woman with COPD primary emphysema Gold Stage III FeV1 39%  Pt with hx of Lung Ca dx 2005 and partial resection per Dr Edwyna Shell. No hx of COPD until LUL partial resection. Had XRT implants.     CAD-20% stenosis on cath-no intervention required, (W-S cards) neg nuclear stress test 11-07  Hurthle cell thyroid adenoma, (Benign 2007)  Lung Cancer 2005  (Dr Shirline Frees and Dr Edwyna Shell)  RLS  seed implants LUL by Dr Kathrynn Running 7-05  Peripheral vascular dz  iron deficiency anemia; s/p 2 Unit transfusion 2-08  HTN  COPD Golds Stage III FeV1 39% 9/10  colonoscopy normal 2-08  pap smear with Dr Patterson Hammersmith at [facility]   Past Surgical History:  Reviewed history from 06/07/2009 and no changes required.  appendectomy age 84,  Cardiac Cath,  cholecystectomy age 58,  LUL wedge resection/ VATS, 2005  partial thyroidectomy (Dr Edwyna Shell 2007)  Peripheral leg stented  LUL lobectomy for cystic cavity and Candida 4-08 No CA seen  Past Pulmonary History:  Pulmonary History:  Pulmonary Function Test  Date: 06/26/2009  Height (in.): 64  Gender: Female  Pre-Spirometry  FVC Value: 2.07 L/min Pred: 3.02 L/min % Pred: 68 %  FEV1 Value: 0.86 L Pred: 2.21 L % Pred: 39 %  FEV1/FVC Value: 42 % Pred: 73 % FEF 25-75 Value: 0.34 L/min Pred: 2.53 L/min % Pred: 13 %  Post-Spirometry  FVC Value: 2.22 L/min Pred: 3.02 L/min % Pred: 74 %  FEV1 Value: 0.77 L Pred: 2.21 L % Pred: 35 %  FEV1/FVC Value: 35 % Pred: 73 % FEF 25-75 Value: 0.27 L/min Pred: 2.53 L/min % Pred: 11 %  Lung Volumes  TLC Value: 2.91 L % Pred: 60 %  RV Value: 0.69 L % Pred: 37 %  DLCO Value: 10.9 % % Pred: 55 %  DLCO/VA Value: 3.79 % % Pred: 102 %  Comments:  Severe airflow obstruction without much change with bronchodilators, moderate reduction in lung volume consistent with prior lung resection, moderate reduction in DLCO  Evaluation:    severe obstruction with NO significant bronchodilator response   04/17/2011 Since 2/12: ok unless in heat.  Cannot go fast without dyspnea. Pt denies any significant sore throat, nasal congestion or excess secretions, fever, chills, sweats, unintended weight loss, pleurtic or exertional chest pain, orthopnea PND, or leg swelling Pt denies any increase in rescue therapy over baseline, denies waking up needing it or having any early am or nocturnal exacerbations of coughing/wheezing/or dyspnea. Pt also denies any obvious fluctuation in symptoms with  weather or environmental change or other alleviating or aggravating factors  Past Medical History  Diagnosis Date  . CAD (coronary artery disease)     20% stenosis on cath - no intervention required (W-S cards), negative nuclear stress test 11/07  . Hurthle cell adenoma of thyroid 2007  . Cancer 2007    lung (Dr. Shirline Frees and Dr. Edwyna Shell)  . RLS (restless legs syndrome)   . Peripheral vascular disease   . Iron deficiency anemia 2/08    s/p 2 unit transfusion  . Hypertension   . COPD (chronic obstructive pulmonary disease) 9/10    golds stage III, FeV1 39%  . Lung cancer      Family History  Problem Relation Age of Onset  . Cancer Mother     Larynx & Endometrial cancer  .  Cancer Father     laryngeal cancer  . Hypertension Father   . Diabetes Sister   . Multiple sclerosis Sister   . Cancer Sister 15    breast     History   Social History  . Marital Status: Married    Spouse Name: N/A    Number of Children: N/A  . Years of Education: N/A   Occupational History  . Not on file.   Social History Main Topics  . Smoking status: Former Smoker -- 1.5 packs/day for 30 years    Types: Cigarettes    Quit date: 10/20/2004  . Smokeless tobacco: Never Used  . Alcohol Use: No  . Drug Use: No  . Sexually Active: Not on file   Other Topics Concern  . Not on file   Social History Narrative  . No narrative on file     Allergies   Allergen Reactions  . Lisinopril-Hydrochlorothiazide     REACTION: N/V cough  . Penicillins     REACTION: Hives     Outpatient Prescriptions Prior to Visit  Medication Sig Dispense Refill  . ALPRAZolam (XANAX) 0.5 MG tablet Take 1 tablet (0.5 mg total) by mouth at bedtime as needed for sleep or anxiety. Take 1 tab by mouth once daily as needed for anxiety  90 tablet  0  . amLODipine (NORVASC) 5 MG tablet Take 1 tablet (5 mg total) by mouth daily.  90 tablet  3  . arformoterol (BROVANA) 15 MCG/2ML NEBU Take 15 mcg by nebulization 2 (two) times daily. 2 ml neb BID       . aspirin 81 MG tablet Take 81 mg by mouth daily.        . beclomethasone (QVAR) 40 MCG/ACT inhaler Inhale 2 puffs into the lungs 2 (two) times daily.        Marland Kitchen desvenlafaxine (PRISTIQ) 50 MG 24 hr tablet Take 1 tablet (50 mg total) by mouth daily.  90 tablet  3  . levothyroxine (SYNTHROID, LEVOTHROID) 50 MCG tablet Take 1 tablet (50 mcg total) by mouth daily.  30 tablet  2  . multivitamin-iron-minerals-folic acid (CENTRUM) chewable tablet Chew 1 tablet by mouth daily.        Marland Kitchen omeprazole (PRILOSEC) 20 MG capsule Take 20 mg by mouth daily.        . QUEtiapine (SEROQUEL) 50 MG tablet Take 1 tablet (50 mg total) by mouth at bedtime.  90 tablet  3  . amLODipine (NORVASC) 5 MG tablet take 1 tablet by mouth once daily  30 tablet  1   Facility-Administered Medications Prior to Visit  Medication Dose Route Frequency Provider Last Rate Last Dose  . TDaP (BOOSTRIX) injection 0.5 mL  0.5 mL Intramuscular Once Seymour Bars, DO         Review of Systems Constitutional:   No  weight loss, night sweats,  Fevers, chills, fatigue, lassitude. HEENT:   No headaches,  Difficulty swallowing,  Tooth/dental problems,  Sore throat,                No sneezing, itching, ear ache, nasal congestion, post nasal drip,   CV:  No chest pain,  Orthopnea, PND, swelling in lower extremities, anasarca, dizziness, palpitations  GI  No heartburn,  indigestion, abdominal pain, nausea, vomiting, diarrhea, change in bowel habits, loss of appetite  Resp: Notes  shortness of breath with exertion not  at rest.  No excess mucus, no productive cough,  No non-productive cough,  No coughing up  of blood.  No change in color of mucus.  No wheezing.  No chest wall deformity  Skin: no rash or lesions.  GU: no dysuria, change in color of urine, no urgency or frequency.  No flank pain.  MS:  No joint pain or swelling.  No decreased range of motion.  No back pain.  Psych:  No change in mood or affect. No depression or anxiety.  No memory loss.   Ct Chest 2/12 IMPRESSION: Stable CT of the chest. No evidence of recurrence or metastatic disease.       Objective:   Physical Exam Filed Vitals:   04/17/11 0913  BP: 140/78  Pulse: 78  Temp: 98.1 F (36.7 C)  TempSrc: Oral  Height: 5\' 4"  (1.626 m)  Weight: 150 lb (68.04 kg)  SpO2: 96%    Gen: Pleasant, well-nourished, in no distress,  normal affect  ENT: No lesions,  mouth clear,  oropharynx clear, no postnasal drip  Neck: No JVD, no TMG, no carotid bruits  Lungs: No use of accessory muscles, no dullness to percussion, distant BS  Cardiovascular: RRR, heart sounds normal, no murmur or gallops, no peripheral edema  Abdomen: soft and NT, no HSM,  BS normal  Musculoskeletal: No deformities, no cyanosis or clubbing  Neuro: alert, non focal  Skin: Warm, no lesions or rashes        Assessment & Plan:   COPD Golds III COPD s/p RUL resection for lung ca,  No recurrence on 2/12 CT Chest  Plan No change in inhaled or maintenance medications. Return in   4 months     Updated Medication List Outpatient Encounter Prescriptions as of 04/17/2011  Medication Sig Dispense Refill  . albuterol (PROVENTIL HFA) 108 (90 BASE) MCG/ACT inhaler Inhale 2 puffs into the lungs 4 (four) times daily as needed. Shortness of breath       . albuterol (PROVENTIL) (2.5 MG/3ML) 0.083% nebulizer  solution Take 2.5 mg by nebulization every 6 (six) hours as needed.        . ALPRAZolam (XANAX) 0.5 MG tablet Take 1 tablet (0.5 mg total) by mouth at bedtime as needed for sleep or anxiety. Take 1 tab by mouth once daily as needed for anxiety  90 tablet  0  . amLODipine (NORVASC) 5 MG tablet Take 1 tablet (5 mg total) by mouth daily.  90 tablet  3  . arformoterol (BROVANA) 15 MCG/2ML NEBU Take 15 mcg by nebulization 2 (two) times daily. 2 ml neb BID       . aspirin 81 MG tablet Take 81 mg by mouth daily.        . beclomethasone (QVAR) 40 MCG/ACT inhaler Inhale 2 puffs into the lungs 2 (two) times daily.        Marland Kitchen desvenlafaxine (PRISTIQ) 50 MG 24 hr tablet Take 1 tablet (50 mg total) by mouth daily.  90 tablet  3  . levothyroxine (SYNTHROID, LEVOTHROID) 50 MCG tablet Take 1 tablet (50 mcg total) by mouth daily.  30 tablet  2  . multivitamin-iron-minerals-folic acid (CENTRUM) chewable tablet Chew 1 tablet by mouth daily.        Marland Kitchen omeprazole (PRILOSEC) 20 MG capsule Take 20 mg by mouth daily.        . QUEtiapine (SEROQUEL) 50 MG tablet Take 1 tablet (50 mg total) by mouth at bedtime.  90 tablet  3  . Spacer/Aero-Holding Chambers (AEROCHAMBER MV) inhaler Use as instructed with qvar and proventil       .  tiotropium (SPIRIVA) 18 MCG inhalation capsule Place 1 capsule (18 mcg total) into inhaler and inhale daily.  90 capsule  4  . DISCONTD: tiotropium (SPIRIVA) 18 MCG inhalation capsule Place 18 mcg into inhaler and inhale daily.        . predniSONE (DELTASONE) 10 MG tablet Take 4 for three days, 3 for three days, 2 for three days, 1 for three days and stop   30 tablet  0  . DISCONTD: amLODipine (NORVASC) 5 MG tablet take 1 tablet by mouth once daily  30 tablet  1   Facility-Administered Encounter Medications as of 04/17/2011  Medication Dose Route Frequency Provider Last Rate Last Dose  . TDaP (BOOSTRIX) injection 0.5 mL  0.5 mL Intramuscular Once Seymour Bars, DO

## 2011-05-09 ENCOUNTER — Ambulatory Visit: Payer: Medicare Other | Admitting: Family Medicine

## 2011-05-28 ENCOUNTER — Ambulatory Visit (INDEPENDENT_AMBULATORY_CARE_PROVIDER_SITE_OTHER): Payer: Medicare Other | Admitting: Family Medicine

## 2011-05-28 ENCOUNTER — Encounter: Payer: Self-pay | Admitting: Family Medicine

## 2011-05-28 ENCOUNTER — Other Ambulatory Visit: Payer: Self-pay | Admitting: Family Medicine

## 2011-05-28 VITALS — BP 151/78 | HR 85 | Ht 64.0 in | Wt 147.0 lb

## 2011-05-28 DIAGNOSIS — E039 Hypothyroidism, unspecified: Secondary | ICD-10-CM

## 2011-05-28 MED ORDER — DESVENLAFAXINE SUCCINATE ER 50 MG PO TB24: 50.0000 mg | ORAL_TABLET | Freq: Every day | ORAL | Status: DC

## 2011-05-28 MED ORDER — ALPRAZOLAM 0.5 MG PO TABS: ORAL_TABLET | ORAL | Status: DC

## 2011-05-28 NOTE — Progress Notes (Signed)
  Subjective:    Patient ID: [name], female    DOB: 01-19-1946, 65 y.o.   MRN: [id number]  HPI Pt was due for lab only and RFs, not OV.  Review of Systems     Objective:   Physical Exam        Assessment & Plan:  TSH order printed. Xanax and Pristiq RFd.

## 2011-05-29 ENCOUNTER — Telehealth: Payer: Self-pay | Admitting: Family Medicine

## 2011-05-29 LAB — TSH: TSH: 0.964 u[IU]/mL (ref 0.350–4.500)

## 2011-05-29 MED ORDER — LEVOTHYROXINE SODIUM 50 MCG PO TABS: 50.0000 ug | ORAL_TABLET | Freq: Every day | ORAL | Status: DC

## 2011-05-29 NOTE — Telephone Encounter (Signed)
Pt notified of results. KJ LPN 

## 2011-05-29 NOTE — Telephone Encounter (Signed)
Pls let pt know that her thyroid function looks perfect on current meds.  I RFd her thyroid medication for another 6 mos today.

## 2011-06-10 ENCOUNTER — Other Ambulatory Visit: Payer: Self-pay | Admitting: Thoracic Surgery

## 2011-06-10 ENCOUNTER — Other Ambulatory Visit: Payer: Self-pay | Admitting: Critical Care Medicine

## 2011-06-10 DIAGNOSIS — C3490 Malignant neoplasm of unspecified part of unspecified bronchus or lung: Secondary | ICD-10-CM

## 2011-06-10 NOTE — Telephone Encounter (Signed)
Dr. Delford Field, Last OV 04/17/11.  Med not on pt's current med list.  Are you ok with this?  If so, I do not mind calling it into pharmacy.

## 2011-06-12 NOTE — Telephone Encounter (Signed)
Rx phoned into Sarah at [facility] # 120 mL x 0.

## 2011-07-09 ENCOUNTER — Ambulatory Visit (INDEPENDENT_AMBULATORY_CARE_PROVIDER_SITE_OTHER): Payer: Medicare Other | Admitting: Thoracic Surgery

## 2011-07-09 ENCOUNTER — Ambulatory Visit
Admission: RE | Admit: 2011-07-09 | Discharge: 2011-07-09 | Disposition: A | Discharge status: Home / Self Care | Payer: Medicare Other | Source: Ambulatory Visit | Attending: Thoracic Surgery | Admitting: Thoracic Surgery

## 2011-07-09 VITALS — BP 122/61 | HR 80 | Resp 20 | Ht 63.0 in | Wt 150.0 lb

## 2011-07-09 DIAGNOSIS — J860 Pyothorax with fistula: Secondary | ICD-10-CM

## 2011-07-09 DIAGNOSIS — C3490 Malignant neoplasm of unspecified part of unspecified bronchus or lung: Secondary | ICD-10-CM

## 2011-07-09 NOTE — Progress Notes (Signed)
HPI the patient returns to 4 followup after left upper lobectomy for stage IA non-small cell lung. CT scan shows no evidence of recurrence. I will refer her back to her primary care physician for further followup. His 5 years since her surgery.   Current Outpatient Prescriptions  Medication Sig Dispense Refill  . albuterol (PROVENTIL HFA) 108 (90 BASE) MCG/ACT inhaler Inhale 2 puffs into the lungs 4 (four) times daily as needed. Shortness of breath       . albuterol (PROVENTIL) (2.5 MG/3ML) 0.083% nebulizer solution Take 2.5 mg by nebulization every 6 (six) hours as needed.        . ALPRAZolam (XANAX) 0.5 MG tablet Take 1 tab by mouth once daily as needed for anxiety  90 tablet  0  . amLODipine (NORVASC) 5 MG tablet Take 1 tablet (5 mg total) by mouth daily.  90 tablet  3  . arformoterol (BROVANA) 15 MCG/2ML NEBU Take 15 mcg by nebulization 2 (two) times daily. 2 ml neb BID       . aspirin 81 MG tablet Take 81 mg by mouth daily.        . beclomethasone (QVAR) 40 MCG/ACT inhaler Inhale 2 puffs into the lungs 2 (two) times daily.        Marland Kitchen desvenlafaxine (PRISTIQ) 50 MG 24 hr tablet Take 1 tablet (50 mg total) by mouth daily.  90 tablet  3  . levothyroxine (SYNTHROID, LEVOTHROID) 50 MCG tablet Take 1 tablet (50 mcg total) by mouth daily.  30 tablet  6  . multivitamin-iron-minerals-folic acid (CENTRUM) chewable tablet Chew 1 tablet by mouth daily.        Marland Kitchen omeprazole (PRILOSEC) 20 MG capsule Take 20 mg by mouth daily.        . promethazine-codeine (PHENERGAN WITH CODEINE) 6.25-10 MG/5ML syrup take 1/2-1 teaspoonful by mouth twice a day if needed  120 mL  0  . QUEtiapine (SEROQUEL) 50 MG tablet Take 1 tablet (50 mg total) by mouth at bedtime.  90 tablet  3  . tiotropium (SPIRIVA) 18 MCG inhalation capsule Place 1 capsule (18 mcg total) into inhaler and inhale daily.  90 capsule  4  . Spacer/Aero-Holding Chambers (AEROCHAMBER MV) inhaler Use as instructed with qvar and proventil        Current  Facility-Administered Medications  Medication Dose Route Frequency Provider Last Rate Last Dose  . TDaP (BOOSTRIX) injection 0.5 mL  0.5 mL Intramuscular Once Seymour Bars, DO         Review of Systems: No change  Physical Exam  Cardiovascular: Normal rate, regular rhythm, normal heart sounds and intact distal pulses.   Pulmonary/Chest: Effort normal and breath sounds normal.     Diagnostic Tests: CT scan showed no evidence of recurrence of the cancer   Impression: 5 years post resection left upper lobe for stage IA non-small cell lung cancer   Plan followup by PCP

## 2011-09-02 ENCOUNTER — Encounter: Payer: Self-pay | Admitting: Family Medicine

## 2011-09-02 ENCOUNTER — Ambulatory Visit (INDEPENDENT_AMBULATORY_CARE_PROVIDER_SITE_OTHER): Payer: Medicare Other | Admitting: Family Medicine

## 2011-09-02 VITALS — BP 133/72 | HR 91 | Ht 64.0 in | Wt 154.0 lb

## 2011-09-02 DIAGNOSIS — Z23 Encounter for immunization: Secondary | ICD-10-CM

## 2011-09-02 MED ORDER — CELECOXIB 200 MG PO CAPS: 200.0000 mg | ORAL_CAPSULE | Freq: Every day | ORAL | Status: DC

## 2011-09-02 MED ORDER — MELOXICAM 7.5 MG PO TABS: 7.5000 mg | ORAL_TABLET | Freq: Every day | ORAL | Status: DC

## 2011-09-02 NOTE — Progress Notes (Signed)
Subjective:    Patient ID: [name], female    DOB: 1946/08/08, 65 y.o.   MRN: [id number]  HPI Mr. comes here because of several things. #! Hypertension patient has a history of hypertension she is on lisinopril HCTZ #2 maintenance in reviewing her health maintenance records the last Hospital rehabilitation record is 1966 she stopped that she's been getting Pap smears her medical basis we'll need to try to get her to a records from her next Pap smear next month and after that to indicate that she's been getting them on a regular basis. In addendum she's also had a mammogram she reports as being normal. We will try to get the exact date. #3 immunization needs she got a flu shot today we talked at length about the zoster vaccination Y. is important what appeared presents. She states she's never had chickenpox explained to her that she's never had chickenpox was also vaccination is more important.  #4 she has history of hypothyroidism her Synthroid dosage was recently increased she has some concerns and issues about that she had a partial thyroidectomy for adenoma. Explained to her that more than likely adenoma with required her to have part talked and removed because of the concern that this could progress to a thyroid CA. At the adenoma represents a thought Lantus underfunctioning and is being stimulated to reduce extra tissue to compensate for that. She does have a daughter who recently  developed hypothyroidism in pregnancy. #5 lung cancer she's had a history of lung cancer and has recently been released by her surgeon for follow up of  her lung cancer. #6 arthritis she reports increase aching and swelling of her hands and fingers and lower extremity joints. She wants to know if there is something she can use for her for her arthritis. Review of Systems Some anxiety and fatigue present in her life as well as stress.    Objective:   Physical Exam  Constitutional: She is oriented to person,  place, and time. She appears well-developed and well-nourished.  HENT:  Head: Normocephalic and atraumatic.  Neck: Neck supple. No tracheal deviation present.       Thyroid present on both sides of the trachea.  Cardiovascular: Normal rate, regular rhythm and normal heart sounds.   Pulmonary/Chest: Effort normal and breath sounds normal. No respiratory distress.  Neurological: She is alert and oriented to person, place, and time.  Skin: Skin is warm and dry.  Psychiatric: She has a normal mood and affect.   BP 133/72  Pulse 91  Ht 5\' 4"  (1.626 m)  Wt 154 lb (69.854 kg)  BMI 26.43 kg/m2  SpO2 95% LABS REVIEWED       Assessment & Plan:  #1 blood pressure appears to be under good control continue on the lisinopril. #2 she's did have a Pap smear done later this month. We'll try to get that date and her the date for her mammogram report included into her health maintenance maintenance  records #3 health maintenance will try to get her to contact her Medicare office to see if she is returns in 4 months if he given the shingles vaccination initially out told her mostly apparently she cannot get the flu and the shingles I was told later that if the pneumonia vaccination and the shingles which we can not give together. #4 history of thyroid adenoma hypothyroidism we'll check a TSH and a lipid panel she returns in 4 months if she can fasting lipid tests a day. #  5 lung cancer she demonstrates good aeration of her lungs she is on some medications to help her breathing and with the parent died of COPD continue her albuterol spray the and Qvar. #6 arthritis patient reports having increased trouble with arthritis explain to her that Celebrex be my first choice because of her age etc. and she is now allergic to sulfur. We'll give a prescription for Celebrex 200 mg stressed to her that if her insurance will not pay for Celebrex 200 mg will try Mobic 7.5 will start using Tylenol and we have be careful to  keep her under 3 g of Tylenol daily if she wants to use Tylenol first line for the joint pain and

## 2011-09-02 NOTE — Patient Instructions (Signed)
Arthritis, Nonspecific  Arthritis is inflammation of a joint. This usually means pain, redness, warmth or swelling are present. One or more joints may be involved. There are a number of types of arthritis. Your caregiver may not be able to tell what type of arthritis you have right away.  CAUSES   The most common cause of arthritis is the wear and tear on the joint (osteoarthritis). This causes damage to the cartilage, which can break down over time. The knees, hips, back and neck are most often affected by this type of arthritis.  Other types of arthritis and common causes of joint pain include:  · Sprains and other injuries near the joint. Sometimes minor sprains and injuries cause pain and swelling that develop hours later.   · Rheumatoid arthritis. This affects hands, feet and knees. It usually affects both sides of your body at the same time. It is often associated with chronic ailments, fever, weight loss and general weakness.   · Crystal arthritis. Gout and pseudo gout can cause occasional acute severe pain, redness and swelling in the foot, ankle, or knee.   · Infectious arthritis. Bacteria can get into a joint through a break in overlying skin. This can cause infection of the joint. Bacteria and viruses can also spread through the blood and affect your joints.   · Drug, infectious and allergy reactions. Sometimes joints can become mildly painful and slightly swollen with these types of illnesses.   SYMPTOMS   · Pain is the main symptom.   · Your joint or joints can also be red, swollen and warm or hot to the touch.   · You may have a fever with certain types of arthritis, or even feel overall ill.   · The joint with arthritis will hurt with movement. Stiffness is present with some types of arthritis.   DIAGNOSIS   Your caregiver will suspect arthritis based on your description of your symptoms and on your exam. Testing may be needed to find the type of arthritis:  · Blood and sometimes urine tests.    · X-ray tests and sometimes CT or MRI scans.   · Removal of fluid from the joint (arthrocentesis) is done to check for bacteria, crystals or other causes. Your caregiver (or a specialist) will numb the area over the joint with a local anesthetic, and use a needle to remove joint fluid for examination. This procedure is only minimally uncomfortable.   · Even with these tests, your caregiver may not be able to tell what kind of arthritis you have. Consultation with a specialist (rheumatologist) may be helpful.   TREATMENT   Your caregiver will discuss with you treatment specific to your type of arthritis. If the specific type cannot be determined, then the following general recommendations may apply.  Treatment of severe joint pain includes:  · Rest.   · Elevation.   · Anti-inflammatory medication (for example, ibuprofen) may be prescribed. Avoiding activities that cause increased pain.   · Only take over-the-counter or prescription medicines for pain and discomfort as recommended by your caregiver.   · Cold packs over an inflamed joint may be used for 10 to 15 minutes every hour. Hot packs sometimes feel better, but do not use overnight. Do not use hot packs if you are diabetic without your caregiver's permission.   · A cortisone shot into arthritic joints may help reduce pain and swelling.   · Any acute arthritis that gets worse over the next 1 to 2   days needs to be looked at to be sure there is no joint infection.   Long-term arthritis treatment involves modifying activities and lifestyle to reduce joint stress jarring. This can include weight loss. Also, exercise is needed to nourish the joint cartilage and remove waste. This helps keep the muscles around the joint strong.  HOME CARE INSTRUCTIONS   · Do not take aspirin to relieve pain if gout is suspected. This elevates uric acid levels.   · Only take over-the-counter or prescription medicines for pain, discomfort or fever as directed by your caregiver.    · Rest the joint as much as possible.   · If your joint is swollen, keep it elevated.   · Use crutches if the painful joint is in your leg.   · Drinking plenty of fluids may help for certain types of arthritis.   · Follow your caregiver's dietary instructions.   · Try low-impact exercise such as:   · Swimming.   · Water aerobics.   · Biking.   · Walking.   · Morning stiffness is often relieved by a warm shower.   · Put your joints through regular range-of-motion.   SEEK MEDICAL CARE IF:   · You do not feel better in 24 hours or are getting worse.   · You have side effects to medications, or are not getting better with treatment.   SEEK IMMEDIATE MEDICAL CARE IF:   · You have a fever.   · You develop severe joint pain, swelling or redness.   · Many joints are involved and become painful and swollen.   · There is severe back pain and/or leg weakness.   · You have loss of bowel or bladder control.   Document Released: 11/13/2004 Document Revised: 06/18/2011 Document Reviewed: 11/29/2008  [employer]® Patient Information ©2012 [employer].

## 2011-09-15 ENCOUNTER — Encounter: Payer: Self-pay | Admitting: Critical Care Medicine

## 2011-09-15 ENCOUNTER — Ambulatory Visit (INDEPENDENT_AMBULATORY_CARE_PROVIDER_SITE_OTHER): Payer: Medicare Other | Admitting: Critical Care Medicine

## 2011-09-15 VITALS — BP 140/70 | HR 89 | Temp 98.8°F | Ht 64.0 in | Wt 155.0 lb

## 2011-09-15 DIAGNOSIS — J449 Chronic obstructive pulmonary disease, unspecified: Secondary | ICD-10-CM

## 2011-09-15 MED ORDER — BECLOMETHASONE DIPROPIONATE 40 MCG/ACT IN AERS: 2.0000 | INHALATION_SPRAY | Freq: Two times a day (BID) | RESPIRATORY_TRACT | Status: DC

## 2011-09-15 MED ORDER — ALBUTEROL SULFATE (2.5 MG/3ML) 0.083% IN NEBU: 2.5000 mg | INHALATION_SOLUTION | Freq: Four times a day (QID) | RESPIRATORY_TRACT | Status: DC | PRN

## 2011-09-15 MED ORDER — ARFORMOTEROL TARTRATE 15 MCG/2ML IN NEBU: 15.0000 ug | INHALATION_SOLUTION | Freq: Two times a day (BID) | RESPIRATORY_TRACT | Status: DC

## 2011-09-15 MED ORDER — TIOTROPIUM BROMIDE MONOHYDRATE 18 MCG IN CAPS: 18.0000 ug | ORAL_CAPSULE | Freq: Every day | RESPIRATORY_TRACT | Status: DC

## 2011-09-15 MED ORDER — BENZONATATE 100 MG PO CAPS: ORAL_CAPSULE | ORAL | Status: AC

## 2011-09-15 MED ORDER — ALBUTEROL SULFATE HFA 108 (90 BASE) MCG/ACT IN AERS: 2.0000 | INHALATION_SPRAY | Freq: Four times a day (QID) | RESPIRATORY_TRACT | Status: DC | PRN

## 2011-09-15 NOTE — Progress Notes (Signed)
Subjective:    Patient ID: [name], female    DOB: February 05, 1946, 65 y.o.   MRN: [id number]  HPI  65 y.o.  woman with COPD primary emphysema Gold Stage III FeV1 39%  Pt with hx of Lung Ca dx 2005 and partial resection per Dr Edwyna Shell. No hx of COPD until LUL partial resection. Had XRT implants.     CAD-20% stenosis on cath-no intervention required, (W-S cards) neg nuclear stress test 11-07  Hurthle cell thyroid adenoma, (Benign 2007)  Lung Cancer 2005  (Dr Shirline Frees and Dr Edwyna Shell)  RLS  seed implants LUL by Dr Kathrynn Running 7-05  Peripheral vascular dz  iron deficiency anemia; s/p 2 Unit transfusion 2-08  HTN  COPD Golds Stage III FeV1 39% 9/10  colonoscopy normal 2-08  pap smear with Dr Patterson Hammersmith at [facility]   Past Surgical History:  Reviewed history from 06/07/2009 and no changes required.  appendectomy age 30,  Cardiac Cath,  cholecystectomy age 28,  LUL wedge resection/ VATS, 2005  partial thyroidectomy (Dr Edwyna Shell 2007)  Peripheral leg stented  LUL lobectomy for cystic cavity and Candida 4-08 No CA seen  Past Pulmonary History:  Pulmonary History:  Pulmonary Function Test  Date: 06/26/2009  Height (in.): 64  Gender: Female  Pre-Spirometry  FVC Value: 2.07 L/min Pred: 3.02 L/min % Pred: 68 %  FEV1 Value: 0.86 L Pred: 2.21 L % Pred: 39 %  FEV1/FVC Value: 42 % Pred: 73 % FEF 25-75 Value: 0.34 L/min Pred: 2.53 L/min % Pred: 13 %  Post-Spirometry  FVC Value: 2.22 L/min Pred: 3.02 L/min % Pred: 74 %  FEV1 Value: 0.77 L Pred: 2.21 L % Pred: 35 %  FEV1/FVC Value: 35 % Pred: 73 % FEF 25-75 Value: 0.27 L/min Pred: 2.53 L/min % Pred: 11 %  Lung Volumes  TLC Value: 2.91 L % Pred: 60 %  RV Value: 0.69 L % Pred: 37 %  DLCO Value: 10.9 % % Pred: 55 %  DLCO/VA Value: 3.79 % % Pred: 102 %  Comments:  Severe airflow obstruction without much change with bronchodilators, moderate reduction in lung volume consistent with prior lung resection, moderate reduction in DLCO  Evaluation:    severe obstruction with NO significant bronchodilator response   6/28 Since 2/12: ok unless in heat.  Cannot go fast without dyspnea. Pt denies any significant sore throat, nasal congestion or excess secretions, fever, chills, sweats, unintended weight loss, pleurtic or exertional chest pain, orthopnea PND, or leg swelling Pt denies any increase in rescue therapy over baseline, denies waking up needing it or having any early am or nocturnal exacerbations of coughing/wheezing/or dyspnea. Pt also denies any obvious fluctuation in symptoms with  weather or environmental change or other alleviating or aggravating factors   09/15/2011 Likes the benzonatate.   Dyspnea is the same. Cough is dry.  No real chest pain. No changes from last ov. Pt denies any significant sore throat, nasal congestion or excess secretions, fever, chills, sweats, unintended weight loss, pleurtic or exertional chest pain, orthopnea PND, or leg swelling Pt denies any increase in rescue therapy over baseline, denies waking up needing it or having any early am or nocturnal exacerbations of coughing/wheezing/or dyspnea. Pt also denies any obvious fluctuation in symptoms with  weather or environmental change or other alleviating or aggravating factors   Past Medical History  Diagnosis Date  . CAD (coronary artery disease)     20% stenosis on cath - no intervention required (W-S cards), negative nuclear stress  test 11/07  . Hurthle cell adenoma of thyroid 2007  . Cancer 2007    lung (Dr. Shirline Frees and Dr. Edwyna Shell)  . RLS (restless legs syndrome)   . Peripheral vascular disease   . Iron deficiency anemia 2/08    s/p 2 unit transfusion  . Hypertension   . COPD (chronic obstructive pulmonary disease) 9/10    golds stage III, FeV1 39%  . Lung cancer   . Bronchopleural fistula      Family History  Problem Relation Age of Onset  . Cancer Mother     Larynx & Endometrial cancer  . Cancer Father     laryngeal cancer  .  Hypertension Father   . Diabetes Sister   . Multiple sclerosis Sister   . Cancer Sister 31    breast     History   Social History  . Marital Status: Married    Spouse Name: N/A    Number of Children: N/A  . Years of Education: N/A   Occupational History  . Not on file.   Social History Main Topics  . Smoking status: Former Smoker -- 1.5 packs/day for 30 years    Types: Cigarettes    Quit date: 10/20/2004  . Smokeless tobacco: Never Used  . Alcohol Use: No  . Drug Use: No  . Sexually Active: Not on file   Other Topics Concern  . Not on file   Social History Narrative  . No narrative on file     Allergies  Allergen Reactions  . Lisinopril-Hydrochlorothiazide     REACTION: N/V cough  . Macrobid   . Penicillins     REACTION: Hives     Outpatient Prescriptions Prior to Visit  Medication Sig Dispense Refill  . ALPRAZolam (XANAX) 0.5 MG tablet Take 1 tab by mouth once daily as needed for anxiety  90 tablet  0  . amLODipine (NORVASC) 5 MG tablet Take 1 tablet (5 mg total) by mouth daily.  90 tablet  3  . aspirin 81 MG tablet Take 81 mg by mouth daily.        . celecoxib (CELEBREX) 200 MG capsule Take 1 capsule (200 mg total) by mouth daily.  30 capsule  6  . desvenlafaxine (PRISTIQ) 50 MG 24 hr tablet Take 1 tablet (50 mg total) by mouth daily.  90 tablet  3  . levothyroxine (SYNTHROID, LEVOTHROID) 50 MCG tablet Take 1 tablet (50 mcg total) by mouth daily.  30 tablet  6  . meloxicam (MOBIC) 7.5 MG tablet Take 1 tablet (7.5 mg total) by mouth daily. Do not take w/motrin alleve or excedrin Only fill if celebrex is not covered  30 tablet  6  . multivitamin-iron-minerals-folic acid (CENTRUM) chewable tablet Chew 1 tablet by mouth daily.        Marland Kitchen omeprazole (PRILOSEC) 20 MG capsule Take 20 mg by mouth daily.        . promethazine-codeine (PHENERGAN WITH CODEINE) 6.25-10 MG/5ML syrup take 1/2-1 teaspoonful by mouth twice a day if needed  120 mL  0  . QUEtiapine (SEROQUEL) 50  MG tablet Take 1 tablet (50 mg total) by mouth at bedtime.  90 tablet  3  . Spacer/Aero-Holding Chambers (AEROCHAMBER MV) inhaler Use as instructed with qvar and proventil       . albuterol (PROVENTIL HFA) 108 (90 BASE) MCG/ACT inhaler Inhale 2 puffs into the lungs 4 (four) times daily as needed. Shortness of breath       . albuterol (PROVENTIL) (  2.5 MG/3ML) 0.083% nebulizer solution Take 2.5 mg by nebulization every 6 (six) hours as needed.        Marland Kitchen arformoterol (BROVANA) 15 MCG/2ML NEBU Take 15 mcg by nebulization 2 (two) times daily. 2 ml neb BID       . beclomethasone (QVAR) 40 MCG/ACT inhaler Inhale 2 puffs into the lungs 2 (two) times daily.        Marland Kitchen tiotropium (SPIRIVA) 18 MCG inhalation capsule Place 1 capsule (18 mcg total) into inhaler and inhale daily.  90 capsule  4   Facility-Administered Medications Prior to Visit  Medication Dose Route Frequency Provider Last Rate Last Dose  . TDaP (BOOSTRIX) injection 0.5 mL  0.5 mL Intramuscular Once Seymour Bars, DO         Review of Systems  Constitutional:   No  weight loss, night sweats,  Fevers, chills, fatigue, lassitude. HEENT:   No headaches,  Difficulty swallowing,  Tooth/dental problems,  Sore throat,                No sneezing, itching, ear ache, nasal congestion, post nasal drip,   CV:  No chest pain,  Orthopnea, PND, swelling in lower extremities, anasarca, dizziness, palpitations  GI  No heartburn, indigestion, abdominal pain, nausea, vomiting, diarrhea, change in bowel habits, loss of appetite  Resp: Notes  shortness of breath with exertion not  at rest.  No excess mucus, no productive cough,  No non-productive cough,  No coughing up of blood.  No change in color of mucus.  No wheezing.  No chest wall deformity  Skin: no rash or lesions.  GU: no dysuria, change in color of urine, no urgency or frequency.  No flank pain.  MS:  No joint pain or swelling.  No decreased range of motion.  No back pain.  Psych:  No change in  mood or affect. No depression or anxiety.  No memory loss.   Ct Chest 2/12 IMPRESSION: Stable CT of the chest. No evidence of recurrence or metastatic disease.       Objective:   Physical Exam  Filed Vitals:   09/15/11 1000  BP: 140/70  Pulse: 89  Temp: 98.8 F (37.1 C)  TempSrc: Oral  Height: 5\' 4"  (1.626 m)  Weight: 155 lb (70.308 kg)  SpO2: 92%    Gen: Pleasant, well-nourished, in no distress,  normal affect  ENT: No lesions,  mouth clear,  oropharynx clear, no postnasal drip  Neck: No JVD, no TMG, no carotid bruits  Lungs: No use of accessory muscles, no dullness to percussion, distant BS  Cardiovascular: RRR, heart sounds normal, no murmur or gallops, no peripheral edema  Abdomen: soft and NT, no HSM,  BS normal  Musculoskeletal: No deformities, no cyanosis or clubbing  Neuro: alert, non focal  Skin: Warm, no lesions or rashes        Assessment & Plan:   COPD Golds III COPD s/p RUL resection for lung ca,  No recurrence on 2/12 CT Chest  Plan No change in inhaled or maintenance medications. Return in   4 months        Updated Medication List Outpatient Encounter Prescriptions as of 09/15/2011  Medication Sig Dispense Refill  . albuterol (PROVENTIL HFA) 108 (90 BASE) MCG/ACT inhaler Inhale 2 puffs into the lungs 4 (four) times daily as needed. Shortness of breath  2 Inhaler  4  . albuterol (PROVENTIL) (2.5 MG/3ML) 0.083% nebulizer solution Take 3 mLs (2.5 mg total) by nebulization every 6 (  six) hours as needed.  75 mL  4  . ALPRAZolam (XANAX) 0.5 MG tablet Take 1 tab by mouth once daily as needed for anxiety  90 tablet  0  . amLODipine (NORVASC) 5 MG tablet Take 1 tablet (5 mg total) by mouth daily.  90 tablet  3  . arformoterol (BROVANA) 15 MCG/2ML NEBU Take 2 mLs (15 mcg total) by nebulization 2 (two) times daily. 2 ml neb BID  180 mL  4  . aspirin 81 MG tablet Take 81 mg by mouth daily.        . beclomethasone (QVAR) 40 MCG/ACT inhaler  Inhale 2 puffs into the lungs 2 (two) times daily.  3 Inhaler  4  . celecoxib (CELEBREX) 200 MG capsule Take 1 capsule (200 mg total) by mouth daily.  30 capsule  6  . desvenlafaxine (PRISTIQ) 50 MG 24 hr tablet Take 1 tablet (50 mg total) by mouth daily.  90 tablet  3  . levothyroxine (SYNTHROID, LEVOTHROID) 50 MCG tablet Take 1 tablet (50 mcg total) by mouth daily.  30 tablet  6  . meloxicam (MOBIC) 7.5 MG tablet Take 1 tablet (7.5 mg total) by mouth daily. Do not take w/motrin alleve or excedrin Only fill if celebrex is not covered  30 tablet  6  . multivitamin-iron-minerals-folic acid (CENTRUM) chewable tablet Chew 1 tablet by mouth daily.        Marland Kitchen omeprazole (PRILOSEC) 20 MG capsule Take 20 mg by mouth daily.        . promethazine-codeine (PHENERGAN WITH CODEINE) 6.25-10 MG/5ML syrup take 1/2-1 teaspoonful by mouth twice a day if needed  120 mL  0  . QUEtiapine (SEROQUEL) 50 MG tablet Take 1 tablet (50 mg total) by mouth at bedtime.  90 tablet  3  . Spacer/Aero-Holding Chambers (AEROCHAMBER MV) inhaler Use as instructed with qvar and proventil       . tiotropium (SPIRIVA) 18 MCG inhalation capsule Place 1 capsule (18 mcg total) into inhaler and inhale daily.  90 capsule  4  . DISCONTD: albuterol (PROVENTIL HFA) 108 (90 BASE) MCG/ACT inhaler Inhale 2 puffs into the lungs 4 (four) times daily as needed. Shortness of breath       . DISCONTD: albuterol (PROVENTIL) (2.5 MG/3ML) 0.083% nebulizer solution Take 2.5 mg by nebulization every 6 (six) hours as needed.        Marland Kitchen DISCONTD: arformoterol (BROVANA) 15 MCG/2ML NEBU Take 15 mcg by nebulization 2 (two) times daily. 2 ml neb BID       . DISCONTD: beclomethasone (QVAR) 40 MCG/ACT inhaler Inhale 2 puffs into the lungs 2 (two) times daily.        Marland Kitchen DISCONTD: tiotropium (SPIRIVA) 18 MCG inhalation capsule Place 1 capsule (18 mcg total) into inhaler and inhale daily.  90 capsule  4  . benzonatate (TESSALON) 100 MG capsule Take 1-2 every 4-6 hours as  needed for cough  90 capsule  6   Facility-Administered Encounter Medications as of 09/15/2011  Medication Dose Route Frequency Provider Last Rate Last Dose  . TDaP (BOOSTRIX) injection 0.5 mL  0.5 mL Intramuscular Once Seymour Bars, DO

## 2011-09-15 NOTE — Patient Instructions (Signed)
No change in medications. Return in         4 months 

## 2011-09-15 NOTE — Assessment & Plan Note (Signed)
Golds III COPD s/p RUL resection for lung ca,  No recurrence on 2/12 CT Chest  Plan No change in inhaled or maintenance medications. Return in   4 months

## 2011-10-01 ENCOUNTER — Telehealth: Payer: Self-pay | Admitting: *Deleted

## 2011-10-01 NOTE — Telephone Encounter (Signed)
Pt called needing a refill on her Alprazolam but would like it increased to 1mg  bc 0.5mg  is not working as well as she would like. Please advise.

## 2011-10-02 ENCOUNTER — Telehealth: Payer: Self-pay | Admitting: *Deleted

## 2011-10-02 NOTE — Telephone Encounter (Signed)
Pt calling you back and wants to know if you have discussed with Dr.Wade about increasing her med dose. Please call pt back

## 2011-10-03 NOTE — Telephone Encounter (Signed)
The last visit was rather extensive and covered a lot. If she really feels it needs to be increased then She can come back early to discuss that or we wil be glad to maintain her current dosage.

## 2011-10-03 NOTE — Telephone Encounter (Signed)
Dr. Thurmond Butts to address.

## 2011-10-07 NOTE — Telephone Encounter (Signed)
Pt aware. Apt scheduled.

## 2011-10-08 ENCOUNTER — Encounter: Payer: Self-pay | Admitting: Family Medicine

## 2011-10-09 ENCOUNTER — Ambulatory Visit (INDEPENDENT_AMBULATORY_CARE_PROVIDER_SITE_OTHER): Payer: Medicare Other | Admitting: Family Medicine

## 2011-10-09 ENCOUNTER — Encounter: Payer: Self-pay | Admitting: Family Medicine

## 2011-10-09 VITALS — BP 141/78 | HR 84 | Ht 64.0 in | Wt 155.0 lb

## 2011-10-09 DIAGNOSIS — F341 Dysthymic disorder: Secondary | ICD-10-CM

## 2011-10-09 DIAGNOSIS — F32A Depression, unspecified: Secondary | ICD-10-CM

## 2011-10-09 DIAGNOSIS — F329 Major depressive disorder, single episode, unspecified: Secondary | ICD-10-CM

## 2011-10-09 MED ORDER — ALPRAZOLAM 1 MG PO TABS: ORAL_TABLET | ORAL | Status: DC

## 2011-10-09 MED ORDER — QUETIAPINE FUMARATE 100 MG PO TABS: 100.0000 mg | ORAL_TABLET | Freq: Every day | ORAL | Status: DC

## 2011-10-09 NOTE — Patient Instructions (Signed)

## 2011-10-09 NOTE — Progress Notes (Signed)
Patient ID: [name], female   DOB: 1946/04/23, 65 y.o.   MRN: [id number] Patient's here because of anxiety and depression she reports a lot of frustration of the Christmas holidays And dealing with family. She reports going at times but was very close not be around people she also reports frustrated with several family actions behaviors and feeling as if she doesn't know anything and them treating her as if she is maybe she can't really tell any difference mistakes.  She is on seraquil 50 mg which she takes at night and she try to wean herself off of the seraquil she found that she could not sleep at night. She's back on seraquil She wants to increase her Xanax to a whole milligram she states that when she takes the current dosage 0.5 is like taking a baby aspirin. BP 141/78  Pulse 84  Ht 5\' 4"  (1.626 m)  Wt 155 lb (70.308 kg)  BMI 26.61 kg/m2  SpO2 96% The GAD was graded at 18 PhD 9-10 Well-nourished well-developed white female no acute distress  Assessment plan Anxiety depression will increase her Seroquel 100 mg at night Will allow to increase her Xanax to 1 mg during the day returnas scheduled for next visit

## 2011-11-28 ENCOUNTER — Ambulatory Visit (INDEPENDENT_AMBULATORY_CARE_PROVIDER_SITE_OTHER): Payer: Medicare Other | Admitting: Physician Assistant

## 2011-11-28 ENCOUNTER — Encounter: Payer: Self-pay | Admitting: Physician Assistant

## 2011-11-28 VITALS — BP 131/61 | HR 110 | Temp 99.9°F | Ht 63.0 in | Wt 152.0 lb

## 2011-11-28 DIAGNOSIS — J209 Acute bronchitis, unspecified: Secondary | ICD-10-CM

## 2011-11-28 DIAGNOSIS — J440 Chronic obstructive pulmonary disease with acute lower respiratory infection: Secondary | ICD-10-CM

## 2011-11-28 MED ORDER — AZITHROMYCIN 250 MG PO TABS: ORAL_TABLET | ORAL | Status: AC

## 2011-11-28 MED ORDER — PREDNISONE 10 MG PO TABS: 40.0000 mg | ORAL_TABLET | Freq: Every day | ORAL | Status: DC

## 2011-11-28 NOTE — Patient Instructions (Signed)
Take the prednisone 40mg  daily for 10 days. Start Zpak. Call office if not improving by Monday.

## 2011-11-28 NOTE — Progress Notes (Signed)
  Subjective:    Patient ID: [name], female    DOB: June 30, 1946, 66 y.o.   MRN: [id number]  HPI Patient reports to clinic today with shortness of breath. Her SOB and wheezing started on Wednesday (2 days ago). It has gradually gotten worse and last night she had to sleep sitting up. Her COPD was controlled until this episode. She has been using Albuterol nebulizer every 4-6 hours and albuterol inhaler every 2 hours. She denies fever or chills. She has been coughing with clear production.     Review of Systems     Objective:   Physical Exam  Constitutional: She is oriented to person, place, and time. She appears well-developed and well-nourished.  HENT:  Head: Normocephalic and atraumatic.  Right Ear: External ear normal.  Left Ear: External ear normal.  Nose: Nose normal.  Mouth/Throat: Oropharynx is clear and moist. No oropharyngeal exudate.  Eyes: Conjunctivae are normal.  Cardiovascular: Regular rhythm and normal heart sounds.        Tachycardia at 110.   Pulmonary/Chest: She has wheezes. She exhibits tenderness.       Bilateral lung wheezing with coarse breath sounds. Patient not able to take normal deep breaths.  Lymphadenopathy:    She has cervical adenopathy.  Neurological: She is alert and oriented to person, place, and time.  Skin: Skin is warm and dry.  Psychiatric: She has a normal mood and affect. Her behavior is normal.          Assessment & Plan:  COPD with acute bronchitis- Nebulizer given in office. Zpak given along with prednisone for 10 days. Call office if not improving.

## 2011-12-12 ENCOUNTER — Telehealth: Payer: Self-pay | Admitting: Family Medicine

## 2011-12-12 ENCOUNTER — Ambulatory Visit
Admission: RE | Admit: 2011-12-12 | Discharge: 2011-12-12 | Disposition: A | Discharge status: Home / Self Care | Payer: Medicare Other | Source: Ambulatory Visit | Attending: Family Medicine | Admitting: Family Medicine

## 2011-12-12 ENCOUNTER — Ambulatory Visit (INDEPENDENT_AMBULATORY_CARE_PROVIDER_SITE_OTHER): Payer: Medicare Other | Admitting: Family Medicine

## 2011-12-12 ENCOUNTER — Encounter: Payer: Self-pay | Admitting: Family Medicine

## 2011-12-12 VITALS — BP 110/63 | HR 91 | Temp 99.2°F | Ht 64.0 in | Wt 151.0 lb

## 2011-12-12 DIAGNOSIS — R2681 Unsteadiness on feet: Secondary | ICD-10-CM

## 2011-12-12 DIAGNOSIS — R238 Other skin changes: Secondary | ICD-10-CM

## 2011-12-12 DIAGNOSIS — R269 Unspecified abnormalities of gait and mobility: Secondary | ICD-10-CM

## 2011-12-12 DIAGNOSIS — R5381 Other malaise: Secondary | ICD-10-CM

## 2011-12-12 DIAGNOSIS — R531 Weakness: Secondary | ICD-10-CM

## 2011-12-12 DIAGNOSIS — R4789 Other speech disturbances: Secondary | ICD-10-CM | POA: Diagnosis not present

## 2011-12-12 DIAGNOSIS — Z85118 Personal history of other malignant neoplasm of bronchus and lung: Secondary | ICD-10-CM | POA: Diagnosis not present

## 2011-12-12 DIAGNOSIS — R4781 Slurred speech: Secondary | ICD-10-CM

## 2011-12-12 MED ORDER — ACETAMINOPHEN 325 MG PO TABS: 325.0000 mg | ORAL_TABLET | Freq: Four times a day (QID) | ORAL | Status: DC | PRN

## 2011-12-12 MED ORDER — ASPIRIN EC 325 MG PO TBEC: 325.0000 mg | DELAYED_RELEASE_TABLET | Freq: Every day | ORAL | Status: AC

## 2011-12-12 NOTE — Telephone Encounter (Signed)
I called patient after the results of the CT. It was negative. I told her if she has another episode of slurred speech that she miscarried emergency department immediately. I would like her to increase her aspirin 81 mg today 325 mg dose. In addition if her lab work is normal on Monday then I will refer her to neurology for her gait instability. I still question whether if she has the possibility of having brain metastases and this might only be further detected on MRI of the brain.

## 2011-12-12 NOTE — Progress Notes (Signed)
Subjective:    Patient ID: [name], female    DOB: Oct 31, 1945, 66 y.o.   MRN: [id number]  HPI All the sudden had blurry vision and slurred speech.She was sitting watching TV and was on the phone.  Hard to walk at that time. Daughter who was on the other end of the phone thought maybe she was sleepy.  She went to bed right away so not sure how long it lasted. No HA, fever or colds recently. Had URI about 1 month ago.  Speech is better.  Feels her vision is off today. Has upto date lenses.  Has never had this happen before.  No new meds but B12, OTC.  Says hearing is off too. No change in sense of smell.  Has had easy bruising for about 2 months. Last lung scan was 2/12.  She takes a baby ASA daily.    Review of Systems BP 110/63  Pulse 91  Temp(Src) 99.2 F (37.3 C) (Oral)  Ht 5\' 4"  (1.626 m)  Wt 151 lb (68.493 kg)  BMI 25.92 kg/m2  SpO2 95%    Allergies  Allergen Reactions  . Lisinopril-Hydrochlorothiazide     REACTION: N/V cough  . Macrobid   . Penicillins     REACTION: Hives    Past Medical History  Diagnosis Date  . CAD (coronary artery disease)     20% stenosis on cath - no intervention required (W-S cards), negative nuclear stress test 11/07  . Hurthle cell adenoma of thyroid 2007  . Cancer 2007    lung (Dr. Shirline Frees and Dr. Edwyna Shell)  . RLS (restless legs syndrome)   . Peripheral vascular disease   . Iron deficiency anemia 2/08    s/p 2 unit transfusion  . Hypertension   . COPD (chronic obstructive pulmonary disease) 9/10    golds stage III, FeV1 39%  . Lung cancer   . Bronchopleural fistula     Past Surgical History  Procedure Date  . Appendectomy 19  . Heart cartherization   . Cholecystectomy 19  . Lul 2005    LUL wedge resection/VATS  . Thyroidectomy, partial 2007    Dr. Edwyna Shell  . Lul 4/08    LUL lobectomy for cystic cavity and Candida, no cancer seen    History   Social History  . Marital Status: Married    Spouse Name: N/A    Number of  Children: N/A  . Years of Education: N/A   Occupational History  . Not on file.   Social History Main Topics  . Smoking status: Former Smoker -- 1.5 packs/day for 30 years    Types: Cigarettes    Quit date: 10/20/2004  . Smokeless tobacco: Never Used  . Alcohol Use: No  . Drug Use: No  . Sexually Active: Not on file   Other Topics Concern  . Not on file   Social History Narrative  . No narrative on file    Family History  Problem Relation Age of Onset  . Cancer Mother     Larynx & Endometrial cancer  . Cancer Father     laryngeal cancer  . Hypertension Father   . Diabetes Sister   . Multiple sclerosis Sister   . Cancer Sister 31    breast        Objective:   Physical Exam  Constitutional: She is oriented to person, place, and time. She appears well-developed and well-nourished.  HENT:  Head: Normocephalic and atraumatic.  Right Ear: External ear  normal.  Left Ear: External ear normal.  Nose: Nose normal.  Mouth/Throat: Oropharynx is clear and moist.       TMs and canals are clear.   Eyes: Conjunctivae and EOM are normal. Pupils are equal, round, and reactive to light.  Neck: Neck supple. No thyromegaly present.  Cardiovascular: Normal rate, regular rhythm and normal heart sounds.   Pulmonary/Chest: Effort normal and breath sounds normal. She has no wheezes.  Lymphadenopathy:    She has no cervical adenopathy.  Neurological: She is alert and oriented to person, place, and time. No cranial nerve deficit. She displays no Babinski's sign on the right side. She displays no Babinski's sign on the left side.  Reflex Scores:      Tricep reflexes are 1+ on the right side and 1+ on the left side.      Bicep reflexes are 1+ on the right side and 1+ on the left side.      No asymmetry off the face. No dysarthria.  She has 2+ patellar reflex on the right and 1+ on the left.  Strength in UE and LE is 5/5.  Normal rapid alternating movements.  She has a neg rhomberg. She  starts to lean bag but then corrects herself. Normal walking forward and backward and on toes.  She is unable to walk heel to toe without falling.   Skin: Skin is warm and dry.  Psychiatric: She has a normal mood and affect.          Assessment & Plan:  Slurred speech - Would like to do CT Head to rule out stroke and possible mets.  Hx of lung cancer. I am most concerned about stroke.  She has gait instability. Selecting a CBC to rule out infection and check her electrolytes as well. We'll also check a TSH. BP is OK.  If happens again then needs to go to the ED immediately. Hold norvasc. It is also possible her BP dropped and she had dec cerebral perfusion. Will refer to neuro if CT is normal.   Easy bruising-will check CBC with differential today. Also check liver enzymes. I did not check a PT or PTT but will consider this if all over labs are normal.

## 2011-12-13 LAB — CBC WITH DIFFERENTIAL/PLATELET
Basophils Absolute: 0 10*3/uL (ref 0.0–0.1)
Basophils Relative: 0 % (ref 0–1)
Eosinophils Absolute: 0.1 10*3/uL (ref 0.0–0.7)
Eosinophils Relative: 0 % (ref 0–5)
HCT: 41.1 % (ref 36.0–46.0)
Hemoglobin: 13.3 g/dL (ref 12.0–15.0)
Lymphocytes Relative: 17 % (ref 12–46)
Lymphs Abs: 2.2 10*3/uL (ref 0.7–4.0)
MCH: 30 pg (ref 26.0–34.0)
MCHC: 32.4 g/dL (ref 30.0–36.0)
MCV: 92.6 fL (ref 78.0–100.0)
Monocytes Absolute: 1.2 10*3/uL — ABNORMAL HIGH (ref 0.1–1.0)
Monocytes Relative: 9 % (ref 3–12)
Neutro Abs: 9.6 10*3/uL — ABNORMAL HIGH (ref 1.7–7.7)
Neutrophils Relative %: 73 % (ref 43–77)
Platelets: 411 10*3/uL — ABNORMAL HIGH (ref 150–400)
RBC: 4.44 MIL/uL (ref 3.87–5.11)
RDW: 14.2 % (ref 11.5–15.5)
WBC: 13.1 10*3/uL — ABNORMAL HIGH (ref 4.0–10.5)

## 2011-12-13 LAB — COMPLETE METABOLIC PANEL WITH GFR
ALT: 20 U/L (ref 0–35)
AST: 22 U/L (ref 0–37)
Albumin: 3.7 g/dL (ref 3.5–5.2)
Alkaline Phosphatase: 96 U/L (ref 39–117)
BUN: 13 mg/dL (ref 6–23)
CO2: 32 mEq/L (ref 19–32)
Calcium: 8.7 mg/dL (ref 8.4–10.5)
Chloride: 99 mEq/L (ref 96–112)
Creat: 0.58 mg/dL (ref 0.50–1.10)
GFR, Est African American: >89 mL/min
GFR, Est Non African American: >89 mL/min
Glucose, Bld: 105 mg/dL — ABNORMAL HIGH (ref 70–99)
Potassium: 4.3 mEq/L (ref 3.5–5.3)
Sodium: 139 mEq/L (ref 135–145)
Total Bilirubin: 0.5 mg/dL (ref 0.3–1.2)
Total Protein: 6.2 g/dL (ref 6.0–8.3)

## 2011-12-13 LAB — TSH: TSH: 1.099 u[IU]/mL (ref 0.350–4.500)

## 2011-12-15 ENCOUNTER — Telehealth: Payer: Self-pay | Admitting: *Deleted

## 2011-12-15 NOTE — Telephone Encounter (Signed)
Yes please restart the norvasc. Keep eye on pressure.

## 2011-12-15 NOTE — Telephone Encounter (Signed)
Pt states you told her not take her BP med. Pt states that her systolic number is 161 and she didn't look at the diastolic number. She would like to know if you want her back on her BP med.

## 2011-12-16 NOTE — Telephone Encounter (Signed)
Pt notified of MD instructions. KJ LPN 

## 2011-12-18 ENCOUNTER — Encounter: Payer: Self-pay | Admitting: *Deleted

## 2011-12-25 ENCOUNTER — Ambulatory Visit (INDEPENDENT_AMBULATORY_CARE_PROVIDER_SITE_OTHER): Payer: Medicare Other | Admitting: Family Medicine

## 2011-12-25 ENCOUNTER — Encounter: Payer: Self-pay | Admitting: Family Medicine

## 2011-12-25 VITALS — BP 134/78 | HR 77 | Ht 63.0 in | Wt 151.0 lb

## 2011-12-25 DIAGNOSIS — F341 Dysthymic disorder: Secondary | ICD-10-CM

## 2011-12-25 DIAGNOSIS — E785 Hyperlipidemia, unspecified: Secondary | ICD-10-CM

## 2011-12-25 DIAGNOSIS — F32A Depression, unspecified: Secondary | ICD-10-CM

## 2011-12-25 DIAGNOSIS — F419 Anxiety disorder, unspecified: Secondary | ICD-10-CM

## 2011-12-25 DIAGNOSIS — D849 Immunodeficiency, unspecified: Secondary | ICD-10-CM

## 2011-12-25 DIAGNOSIS — Z23 Encounter for immunization: Secondary | ICD-10-CM | POA: Diagnosis not present

## 2011-12-25 DIAGNOSIS — G459 Transient cerebral ischemic attack, unspecified: Secondary | ICD-10-CM

## 2011-12-25 DIAGNOSIS — I10 Essential (primary) hypertension: Secondary | ICD-10-CM

## 2011-12-25 DIAGNOSIS — Z8673 Personal history of transient ischemic attack (TIA), and cerebral infarction without residual deficits: Secondary | ICD-10-CM

## 2011-12-25 MED ORDER — ATORVASTATIN CALCIUM 10 MG PO TABS: 10.0000 mg | ORAL_TABLET | Freq: Every day | ORAL | Status: DC

## 2011-12-25 NOTE — Progress Notes (Signed)
  Subjective:    Patient ID: [name], female    DOB: August 22, 1946, 66 y.o.   MRN: [id number]  HPI Patient had to have her youngest daughter committed due to drug use,ETOH abuse, suicidal ideation. Despite this her ordeal which she apparently has been handling the stress remarkably well getting her daughter to take more responsibility of her life. This does mean more active involvement with her daughter daughter's or her grandchildren she seems willing to make this.  May 7th is when she will be seeing the neurologist. She had what appears to have been a TIA. She reports her aspirin was increased from a baby aspirin to a whole aspirin. She's not had any more spells stresses that were so great one time when she had those spells has improved.  Hyperlipidemia she has never been placed on cholesterol medicine before.   Hypertension. Her blood pressure medicine according to her was temporarily stopped by Dr. Linford Arnold due to hypotension been present. She informed the office with a blood pressure was going back up and was instructed to restart her blood pressure medication.  Immunization need. She still needs to have the varicella vaccination given.    Review of Systems  HENT: Negative for facial swelling.   Neurological: Negative for dizziness, facial asymmetry, speech difficulty, weakness and light-headedness.  Psychiatric/Behavioral: Negative for confusion and dysphoric mood. The patient is nervous/anxious.       BP 134/78  Pulse 77  Ht 5\' 3"  (1.6 m)  Wt 151 lb (68.493 kg)  BMI 26.75 kg/m2  SpO2 96% Objective:   Physical Exam  Vitals reviewed. Constitutional: She is oriented to person, place, and time. She appears well-developed and well-nourished.  HENT:  Head: Normocephalic.  Eyes: Pupils are equal, round, and reactive to light.  Neck: Normal range of motion. Neck supple.  Cardiovascular: Normal rate, regular rhythm and normal heart sounds.   Pulmonary/Chest: Effort normal and  breath sounds normal.  Neurological: She is alert and oriented to person, place, and time.  Skin: Skin is warm and dry.  Psychiatric: She has a normal mood and affect.          Assessment & Plan:  #1 situation disturbance, anxiety depression. Maintain her on the Seroquel. She states that she's not using the Xanax every day which is great. Encourage her to continue trying to salvage her daughter and encourage her to continue providing tough love for her daughter.  #2 possible TIAs and hyperlipidemia. While we await the neurological consultation. Continue with full aspirin a day but also recommend that we get her started on a statin for hyperlipidemia. While her cholesterol and LDL were high the didn't necessarily warranted medication but since she's had this neurological event feel that that medication therapy should be initiated. Also will obtain Doppler studies of both carotids and echo of her heart.  #3 hypertension fair control. I agree with restarting her blood pressure medication at this time.  #4 immunization need will administer varicella vaccination this visit.

## 2011-12-25 NOTE — Patient Instructions (Signed)
Transient Ischemic Attack A transient ischemic attack (TIA) is a "warning stroke" that causes stroke-like symptoms. Unlike a stroke, a TIA does not cause permanent damage to the brain. The symptoms of a TIA can happen very fast and do not last long. It is important to know the symptoms of a TIA and what to do. This can help prevent a major stroke or death. CAUSES   A TIA is caused by a temporary blockage in an artery in the brain or neck (carotid artery). The blockage does not allow the brain to get the blood supply it needs and can cause different symptoms. The blockage can be caused by either:   A blood clot.   Fatty buildup (plaque) in a neck or brain artery.  SYMPTOMS  TIA symptoms are the same as a stroke but are temporary. Symptoms can include sudden:  Numbness or weakness on one side of the body. Especially to the:   Face.   Arm.   Leg.   Trouble speaking, thinking, or confusion.   Change in vision, such as trouble seeing in one or both eyes.   Dizziness, loss of balance, or difficulty walking.   Severe headache.  ANY OF THESE SYMPTOMS MAY REPRESENT A SERIOUS PROBLEM THAT IS AN EMERGENCY. Do not wait to see if the symptoms will go away. Get medical help at once. Call your local emergency services (911 in [place] IMMEDIATELY. DO NOT drive yourself to the hospital. RISK FACTORS Risk factors can increase the risk of developing a TIA. These can include.   High blood pressure (hypertension).   High cholesterol (hyperlipidemia).   Heart disease (atherosclerosis).   Smoking.   Diabetes.   Abnormal heart rhythm (atrial fibrillation).   Family history of a stroke or heart attack.   Use of oral contraceptives (especially when combined with smoking).  DIAGNOSIS   A TIA can be diagnosed based on your:   Symptoms.   History.   Risk factors.   Tests that can help diagnose the symptoms of a TIA include:   CT or MRI scan. These tests can provide detailed images of the  brain.   Carotid ultrasound. This test looks to see if there are blockages in the carotid arteries of your neck.   Arteriography. A thin, small flexible tube (catheter) is inserted through a small cut (incision) in your groin. The catheter is threaded to your carotid or vertebral artery. A dye is then injected into the catheter. The dye highlights the arteries in your brain and allows your caregiver to look for narrowing or blockages that can cause a TIA.  TREATMENT  Based on the cause of a TIA, treatment options can vary. Treatment is important to help prevent a stroke. Treatment options can include:  Medication. Such as:   Clot-busting medicine.   Anti-platelet medicine.   Blood pressure medicine.   Blood thinner medicine.   Surgery:   Carotid endarterectomy. The carotid arteries are the arteries that supply the head and neck with oxygenated blood. This surgery can help remove fatty deposits (plaque) in the carotid arteries.   Angioplasty and stenting. This surgery uses a balloon to dilate a blocked artery in the brain. A stent is a small, metal mesh tube that can help keep an artery open  HOME CARE INSTRUCTIONS   It is important to take all medicine as told by your caregiver. If the medicine has side effects that affect you negatively, tell your caregiver right away. Do not stop taking medicine unless told   by your caregiver. Some medicines may need to be changed to better treat your condition.   Do not smoke. Talk to your caregiver on how to quit smoking.   Eat a diet high in fruits, vegetables and lean meat. Avoid a high fat, high salt diet. A dietician can you help you make healthy food choices.   Maintain a healthy weight. Develop an exercise plan approved by your caregiver.  SEEK IMMEDIATE MEDICAL CARE IF:   You develop weakness or numbness on one side of your body.   You have problems thinking, speaking, or feel confused.   You have vision changes.   You feel dizzy,  have trouble walking, or lose your balance.   You develop a severe headache.  MAKE SURE YOU:   Understand these instructions.   Will watch your condition.   Will get help right away if you are not doing well or get worse.  Document Released: 07/16/2005 Document Revised: 09/25/2011 Document Reviewed: 11/29/2009 [employer] Patient Information 2012 [employer] 

## 2012-01-08 ENCOUNTER — Other Ambulatory Visit: Payer: Self-pay

## 2012-01-08 ENCOUNTER — Ambulatory Visit (HOSPITAL_COMMUNITY): Payer: Medicare Other | Attending: Cardiovascular Disease

## 2012-01-08 DIAGNOSIS — I10 Essential (primary) hypertension: Secondary | ICD-10-CM | POA: Insufficient documentation

## 2012-01-08 DIAGNOSIS — G459 Transient cerebral ischemic attack, unspecified: Secondary | ICD-10-CM

## 2012-01-08 DIAGNOSIS — J449 Chronic obstructive pulmonary disease, unspecified: Secondary | ICD-10-CM | POA: Diagnosis not present

## 2012-01-08 DIAGNOSIS — J4489 Other specified chronic obstructive pulmonary disease: Secondary | ICD-10-CM | POA: Insufficient documentation

## 2012-01-13 ENCOUNTER — Ambulatory Visit (INDEPENDENT_AMBULATORY_CARE_PROVIDER_SITE_OTHER): Payer: Medicare Other | Admitting: Adult Health

## 2012-01-13 ENCOUNTER — Encounter (INDEPENDENT_AMBULATORY_CARE_PROVIDER_SITE_OTHER): Payer: Medicare Other

## 2012-01-13 ENCOUNTER — Encounter: Payer: Self-pay | Admitting: Adult Health

## 2012-01-13 VITALS — BP 130/64 | HR 109 | Temp 99.4°F | Ht 64.0 in | Wt 151.0 lb

## 2012-01-13 DIAGNOSIS — I6529 Occlusion and stenosis of unspecified carotid artery: Secondary | ICD-10-CM

## 2012-01-13 DIAGNOSIS — J4489 Other specified chronic obstructive pulmonary disease: Secondary | ICD-10-CM

## 2012-01-13 DIAGNOSIS — G459 Transient cerebral ischemic attack, unspecified: Secondary | ICD-10-CM

## 2012-01-13 DIAGNOSIS — J449 Chronic obstructive pulmonary disease, unspecified: Secondary | ICD-10-CM | POA: Diagnosis not present

## 2012-01-13 MED ORDER — HYDROCODONE-HOMATROPINE 5-1.5 MG/5ML PO SYRP: 5.0000 mL | ORAL_SOLUTION | Freq: Four times a day (QID) | ORAL | Status: AC | PRN

## 2012-01-13 MED ORDER — DOXYCYCLINE HYCLATE 100 MG PO TABS: 100.0000 mg | ORAL_TABLET | Freq: Two times a day (BID) | ORAL | Status: AC

## 2012-01-13 MED ORDER — PREDNISONE 10 MG PO TABS: ORAL_TABLET | ORAL | Status: DC

## 2012-01-13 NOTE — Progress Notes (Signed)
Subjective:    Patient ID: [name], female    DOB: 1946/09/18, 66 y.o.   MRN: [id number]  HPI  66 y.o.  woman with COPD primary emphysema Gold Stage III FeV1 39%  Pt with hx of Lung Ca dx 2005 and partial resection per Dr Edwyna Shell. No hx of COPD until LUL partial resection. Had XRT implants.     CAD-20% stenosis on cath-no intervention required, (W-S cards) neg nuclear stress test 11-07  Hurthle cell thyroid adenoma, (Benign 2007)  Lung Cancer 2005  (Dr Shirline Frees and Dr Edwyna Shell)  RLS  seed implants LUL by Dr Kathrynn Running 7-05  Peripheral vascular dz  iron deficiency anemia; s/p 2 Unit transfusion 2-08  HTN  COPD Golds Stage III FeV1 39% 9/10  colonoscopy normal 2-08  pap smear with Dr Patterson Hammersmith at [facility]   Past Surgical History:  Reviewed history from 06/07/2009 and no changes required.  appendectomy age 66,  Cardiac Cath,  cholecystectomy age 66,  LUL wedge resection/ VATS, 2005  partial thyroidectomy (Dr Edwyna Shell 2007)  Peripheral leg stented  LUL lobectomy for cystic cavity and Candida 4-08 No CA seen  Past Pulmonary History:  Pulmonary History:  Pulmonary Function Test  Date: 06/26/2009  Height (in.): 64  Gender: Female  Pre-Spirometry  FVC Value: 2.07 L/min Pred: 3.02 L/min % Pred: 68 %  FEV1 Value: 0.86 L Pred: 2.21 L % Pred: 39 %  FEV1/FVC Value: 42 % Pred: 73 % FEF 25-75 Value: 0.34 L/min Pred: 2.53 L/min % Pred: 13 %  Post-Spirometry  FVC Value: 2.22 L/min Pred: 3.02 L/min % Pred: 74 %  FEV1 Value: 0.77 L Pred: 2.21 L % Pred: 35 %  FEV1/FVC Value: 35 % Pred: 73 % FEF 25-75 Value: 0.27 L/min Pred: 2.53 L/min % Pred: 11 %  Lung Volumes  TLC Value: 2.91 L % Pred: 60 %  RV Value: 0.69 L % Pred: 37 %  DLCO Value: 10.9 % % Pred: 55 %  DLCO/VA Value: 3.79 % % Pred: 102 %  Comments:  Severe airflow obstruction without much change with bronchodilators, moderate reduction in lung volume consistent with prior lung resection, moderate reduction in DLCO  Evaluation:    severe obstruction with NO significant bronchodilator response   6/28 Since 2/12: ok unless in heat.  Cannot go fast without dyspnea. Pt denies any significant sore throat, nasal congestion or excess secretions, fever, chills, sweats, unintended weight loss, pleurtic or exertional chest pain, orthopnea PND, or leg swelling Pt denies any increase in rescue therapy over baseline, denies waking up needing it or having any early am or nocturnal exacerbations of coughing/wheezing/or dyspnea. Pt also denies any obvious fluctuation in symptoms with  weather or environmental change or other alleviating or aggravating factors   09/15/2011 Likes the benzonatate.   Dyspnea is the same. Cough is dry.  No real chest pain. No changes from last ov.  >  01/13/2012 Acute OV  Complains of  cough with clear mucus, sob, and wheezing x 4days. Cough is getting worse .  Not feeling good. No chest pain or hemoptysis . No n/v .  OTC  not working.     Past Medical History  Diagnosis Date  . CAD (coronary artery disease)     20% stenosis on cath - no intervention required (W-S cards), negative nuclear stress test 11/07  . Hurthle cell adenoma of thyroid 2007  . Cancer 2007    lung (Dr. Shirline Frees and Dr. Edwyna Shell)  . RLS (restless legs  syndrome)   . Peripheral vascular disease   . Iron deficiency anemia 2/08    s/p 2 unit transfusion  . Hypertension   . COPD (chronic obstructive pulmonary disease) 9/10    golds stage III, FeV1 39%  . Lung cancer   . Bronchopleural fistula      Family History  Problem Relation Age of Onset  . Cancer Mother     Larynx & Endometrial cancer  . Cancer Father     laryngeal cancer  . Hypertension Father   . Diabetes Sister   . Multiple sclerosis Sister   . Cancer Sister 36    breast     History   Social History  . Marital Status: Married    Spouse Name: N/A    Number of Children: N/A  . Years of Education: N/A   Occupational History  . Not on file.   Social  History Main Topics  . Smoking status: Former Smoker -- 1.5 packs/day for 30 years    Types: Cigarettes    Quit date: 10/20/2004  . Smokeless tobacco: Never Used  . Alcohol Use: No  . Drug Use: No  . Sexually Active: Not on file   Other Topics Concern  . Not on file   Social History Narrative  . No narrative on file     Allergies  Allergen Reactions  . Lisinopril-Hydrochlorothiazide     REACTION: N/V cough  . Macrobid   . Penicillins     REACTION: Hives     Outpatient Prescriptions Prior to Visit  Medication Sig Dispense Refill  . albuterol (PROVENTIL HFA) 108 (90 BASE) MCG/ACT inhaler Inhale 2 puffs into the lungs 4 (four) times daily as needed. Shortness of breath  2 Inhaler  4  . albuterol (PROVENTIL) (2.5 MG/3ML) 0.083% nebulizer solution Take 3 mLs (2.5 mg total) by nebulization every 6 (six) hours as needed.  75 mL  4  . ALPRAZolam (XANAX) 1 MG tablet Take 1 tab by mouth once daily as needed for anxiety  90 tablet  0  . amLODipine (NORVASC) 5 MG tablet Take 1 tablet (5 mg total) by mouth daily.  90 tablet  3  . arformoterol (BROVANA) 15 MCG/2ML NEBU Take 2 mLs (15 mcg total) by nebulization 2 (two) times daily. 2 ml neb BID  180 mL  4  . atorvastatin (LIPITOR) 10 MG tablet Take 1 tablet (10 mg total) by mouth daily.  30 tablet  11  . beclomethasone (QVAR) 40 MCG/ACT inhaler Inhale 2 puffs into the lungs 2 (two) times daily.  3 Inhaler  4  . BIOTIN PO Take by mouth.      . celecoxib (CELEBREX) 200 MG capsule Take 1 capsule (200 mg total) by mouth daily.  30 capsule  6  . desvenlafaxine (PRISTIQ) 50 MG 24 hr tablet Take 1 tablet (50 mg total) by mouth daily.  90 tablet  3  . levothyroxine (SYNTHROID, LEVOTHROID) 50 MCG tablet Take 1 tablet (50 mcg total) by mouth daily.  30 tablet  6  . multivitamin-iron-minerals-folic acid (CENTRUM) chewable tablet Chew 1 tablet by mouth daily.        Marland Kitchen omeprazole (PRILOSEC) 20 MG capsule Take 20 mg by mouth daily.        . QUEtiapine  (SEROQUEL) 100 MG tablet Take 1 tablet (100 mg total) by mouth at bedtime.  90 tablet  3  . Spacer/Aero-Holding Chambers (AEROCHAMBER MV) inhaler Use as instructed with qvar and proventil       .  tiotropium (SPIRIVA) 18 MCG inhalation capsule Place 1 capsule (18 mcg total) into inhaler and inhale daily.  90 capsule  4     Review of Systems  Constitutional:   No  weight loss, night sweats,  Fevers, chills, fatigue, lassitude. HEENT:   No headaches,  Difficulty swallowing,  Tooth/dental problems,  Sore throat,                No sneezing, itching, ear ache, nasal congestion, post nasal drip,   CV:  No chest pain,  Orthopnea, PND, swelling in lower extremities, anasarca, dizziness, palpitations  GI  No heartburn, indigestion, abdominal pain, nausea, vomiting, diarrhea, change in bowel habits, loss of appetite  Resp: Notes  shortness of breath with exertion not  at rest.  No excess mucus, no productive cough,  No non-productive cough,  No coughing up of blood.  No change in color of mucus.  No wheezing.  No chest wall deformity  Skin: no rash or lesions.  GU: no dysuria, change in color of urine, no urgency or frequency.  No flank pain.  MS:  No joint pain or swelling.  No decreased range of motion.  No back pain.  Psych:  No change in mood or affect. No depression or anxiety.  No memory loss.   Ct Chest 2/12 IMPRESSION: Stable CT of the chest. No evidence of recurrence or metastatic disease.       Objective:   Physical Exam  Filed Vitals:   01/13/12 1108  BP: 130/64  Pulse: 109  Temp: 99.4 F (37.4 C)  TempSrc: Oral  Height: 5\' 4"  (1.626 m)  Weight: 151 lb (68.493 kg)  SpO2: 93%    Gen: Pleasant, well-nourished, in no distress,  normal affect  ENT: No lesions,  mouth clear,  oropharynx clear, no postnasal drip  Neck: No JVD, no TMG, no carotid bruits  Lungs: No use of accessory muscles, no dullness to percussion, distant BS  Cardiovascular: RRR, heart sounds  normal, no murmur or gallops, no peripheral edema  Abdomen: soft and NT, no HSM,  BS normal  Musculoskeletal: No deformities, no cyanosis or clubbing  Neuro: alert, non focal  Skin: Warm, no lesions or rashes        Assessment & Plan:   No problem-specific assessment & plan notes found for this encounter.   Updated Medication List Outpatient Encounter Prescriptions as of 01/13/2012  Medication Sig Dispense Refill  . albuterol (PROVENTIL HFA) 108 (90 BASE) MCG/ACT inhaler Inhale 2 puffs into the lungs 4 (four) times daily as needed. Shortness of breath  2 Inhaler  4  . albuterol (PROVENTIL) (2.5 MG/3ML) 0.083% nebulizer solution Take 3 mLs (2.5 mg total) by nebulization every 6 (six) hours as needed.  75 mL  4  . ALPRAZolam (XANAX) 1 MG tablet Take 1 tab by mouth once daily as needed for anxiety  90 tablet  0  . amLODipine (NORVASC) 5 MG tablet Take 1 tablet (5 mg total) by mouth daily.  90 tablet  3  . arformoterol (BROVANA) 15 MCG/2ML NEBU Take 2 mLs (15 mcg total) by nebulization 2 (two) times daily. 2 ml neb BID  180 mL  4  . atorvastatin (LIPITOR) 10 MG tablet Take 1 tablet (10 mg total) by mouth daily.  30 tablet  11  . beclomethasone (QVAR) 40 MCG/ACT inhaler Inhale 2 puffs into the lungs 2 (two) times daily.  3 Inhaler  4  . BIOTIN PO Take by mouth.      Marland Kitchen  celecoxib (CELEBREX) 200 MG capsule Take 1 capsule (200 mg total) by mouth daily.  30 capsule  6  . desvenlafaxine (PRISTIQ) 50 MG 24 hr tablet Take 1 tablet (50 mg total) by mouth daily.  90 tablet  3  . levothyroxine (SYNTHROID, LEVOTHROID) 50 MCG tablet Take 1 tablet (50 mcg total) by mouth daily.  30 tablet  6  . multivitamin-iron-minerals-folic acid (CENTRUM) chewable tablet Chew 1 tablet by mouth daily.        Marland Kitchen omeprazole (PRILOSEC) 20 MG capsule Take 20 mg by mouth daily.        . QUEtiapine (SEROQUEL) 100 MG tablet Take 1 tablet (100 mg total) by mouth at bedtime.  90 tablet  3  . Spacer/Aero-Holding Chambers  (AEROCHAMBER MV) inhaler Use as instructed with qvar and proventil       . tiotropium (SPIRIVA) 18 MCG inhalation capsule Place 1 capsule (18 mcg total) into inhaler and inhale daily.  90 capsule  4

## 2012-01-13 NOTE — Patient Instructions (Signed)
Doxycycline 100mg  Twice daily  For 7 days -take with food.  Prednisone taper over next week .  Fluids and rest  Mucinex DM Twice daily As needed  Cough/congestion  Hydromet 1-2 tsp every 4-6 hr As needed  Cough- may make you sleepy.  Please contact office for sooner follow up if symptoms do not improve or worsen or seek emergency care  follow up Dr. Delford Field  As planned in 6-8 weeks

## 2012-01-15 DIAGNOSIS — G459 Transient cerebral ischemic attack, unspecified: Secondary | ICD-10-CM | POA: Diagnosis not present

## 2012-01-15 DIAGNOSIS — F341 Dysthymic disorder: Secondary | ICD-10-CM | POA: Diagnosis not present

## 2012-01-15 DIAGNOSIS — I10 Essential (primary) hypertension: Secondary | ICD-10-CM | POA: Diagnosis not present

## 2012-01-15 NOTE — Assessment & Plan Note (Signed)
Flare   Plan:  Doxycycline 100mg  Twice daily  For 7 days -take with food.  Prednisone taper over next week .  Fluids and rest  Mucinex DM Twice daily As needed  Cough/congestion  Hydromet 1-2 tsp every 4-6 hr As needed  Cough- may make you sleepy.  Please contact office for sooner follow up if symptoms do not improve or worsen or seek emergency care  follow up Dr. Delford Field  As planned in 6-8 weeks

## 2012-02-09 ENCOUNTER — Other Ambulatory Visit: Payer: Self-pay | Admitting: Family Medicine

## 2012-03-04 ENCOUNTER — Encounter: Payer: Self-pay | Admitting: Family Medicine

## 2012-03-04 ENCOUNTER — Ambulatory Visit (INDEPENDENT_AMBULATORY_CARE_PROVIDER_SITE_OTHER): Payer: Medicare Other | Admitting: Family Medicine

## 2012-03-04 VITALS — BP 138/65 | HR 72 | Ht 64.0 in | Wt 151.0 lb

## 2012-03-04 DIAGNOSIS — E785 Hyperlipidemia, unspecified: Secondary | ICD-10-CM

## 2012-03-04 DIAGNOSIS — G459 Transient cerebral ischemic attack, unspecified: Secondary | ICD-10-CM

## 2012-03-04 DIAGNOSIS — R7309 Other abnormal glucose: Secondary | ICD-10-CM

## 2012-03-04 DIAGNOSIS — I10 Essential (primary) hypertension: Secondary | ICD-10-CM | POA: Diagnosis not present

## 2012-03-04 DIAGNOSIS — R739 Hyperglycemia, unspecified: Secondary | ICD-10-CM

## 2012-03-04 DIAGNOSIS — R413 Other amnesia: Secondary | ICD-10-CM

## 2012-03-04 DIAGNOSIS — R7989 Other specified abnormal findings of blood chemistry: Secondary | ICD-10-CM

## 2012-03-04 LAB — COMPLETE METABOLIC PANEL WITH GFR
ALT: 19 U/L (ref 0–35)
AST: 21 U/L (ref 0–37)
Albumin: 4.1 g/dL (ref 3.5–5.2)
Alkaline Phosphatase: 97 U/L (ref 39–117)
BUN: 12 mg/dL (ref 6–23)
CO2: 32 mEq/L (ref 19–32)
Calcium: 9.1 mg/dL (ref 8.4–10.5)
Chloride: 102 mEq/L (ref 96–112)
Creat: 0.5 mg/dL (ref 0.50–1.10)
GFR, Est African American: >89 mL/min
GFR, Est Non African American: >89 mL/min
Glucose, Bld: 89 mg/dL (ref 70–99)
Potassium: 3.8 mEq/L (ref 3.5–5.3)
Sodium: 141 mEq/L (ref 135–145)
Total Bilirubin: 0.4 mg/dL (ref 0.3–1.2)
Total Protein: 6.2 g/dL (ref 6.0–8.3)

## 2012-03-04 LAB — CBC WITH DIFFERENTIAL/PLATELET
Basophils Absolute: 0 10*3/uL (ref 0.0–0.1)
Basophils Relative: 0 % (ref 0–1)
Eosinophils Absolute: 0.2 10*3/uL (ref 0.0–0.7)
Eosinophils Relative: 2 % (ref 0–5)
HCT: 38 % (ref 36.0–46.0)
Hemoglobin: 12.7 g/dL (ref 12.0–15.0)
Lymphocytes Relative: 24 % (ref 12–46)
Lymphs Abs: 1.7 10*3/uL (ref 0.7–4.0)
MCH: 29.8 pg (ref 26.0–34.0)
MCHC: 33.4 g/dL (ref 30.0–36.0)
MCV: 89.2 fL (ref 78.0–100.0)
Monocytes Absolute: 0.6 10*3/uL (ref 0.1–1.0)
Monocytes Relative: 8 % (ref 3–12)
Neutro Abs: 4.5 10*3/uL (ref 1.7–7.7)
Neutrophils Relative %: 66 % (ref 43–77)
Platelets: 308 10*3/uL (ref 150–400)
RBC: 4.26 MIL/uL (ref 3.87–5.11)
RDW: 14 % (ref 11.5–15.5)
WBC: 7 10*3/uL (ref 4.0–10.5)

## 2012-03-04 LAB — LIPID PANEL
Cholesterol: 153 mg/dL (ref 0–200)
HDL: 68 mg/dL (ref >39)
LDL Cholesterol: 71 mg/dL (ref 0–99)
Total CHOL/HDL Ratio: 2.3 Ratio
Triglycerides: 69 mg/dL (ref <150)
VLDL: 14 mg/dL (ref 0–40)

## 2012-03-04 LAB — HEMOGLOBIN A1C
Hgb A1c MFr Bld: 5.8 % — ABNORMAL HIGH (ref <5.7)
Mean Plasma Glucose: 120 mg/dL — ABNORMAL HIGH (ref <117)

## 2012-03-04 MED ORDER — DESVENLAFAXINE SUCCINATE ER 50 MG PO TB24: 50.0000 mg | ORAL_TABLET | Freq: Every day | ORAL | Status: DC

## 2012-03-04 MED ORDER — PHOSPHATIDYLSERINE-DHA-EPA 100-19.5-6.5 MG PO CAPS: 1.0000 | ORAL_CAPSULE | ORAL | Status: DC

## 2012-03-04 MED ORDER — CELECOXIB 200 MG PO CAPS: 200.0000 mg | ORAL_CAPSULE | Freq: Every day | ORAL | Status: DC

## 2012-03-04 MED ORDER — ATORVASTATIN CALCIUM 10 MG PO TABS: 10.0000 mg | ORAL_TABLET | Freq: Every day | ORAL | Status: DC

## 2012-03-04 MED ORDER — LEVOTHYROXINE SODIUM 50 MCG PO TABS: 50.0000 ug | ORAL_TABLET | Freq: Every day | ORAL | Status: DC

## 2012-03-04 MED ORDER — AMLODIPINE BESYLATE 5 MG PO TABS: 5.0000 mg | ORAL_TABLET | Freq: Every day | ORAL | Status: DC

## 2012-03-04 NOTE — Patient Instructions (Signed)
Transient Ischemic Attack A transient ischemic attack (TIA) is a "warning stroke" that causes stroke-like symptoms. Unlike a stroke, a TIA does not cause permanent damage to the brain. The symptoms of a TIA can happen very fast and do not last long. It is important to know the symptoms of a TIA and what to do. This can help prevent a major stroke or death. CAUSES   A TIA is caused by a temporary blockage in an artery in the brain or neck (carotid artery). The blockage does not allow the brain to get the blood supply it needs and can cause different symptoms. The blockage can be caused by either:   A blood clot.   Fatty buildup (plaque) in a neck or brain artery.  SYMPTOMS  TIA symptoms are the same as a stroke but are temporary. Symptoms can include sudden:  Numbness or weakness on one side of the body. Especially to the:   Face.   Arm.   Leg.   Trouble speaking, thinking, or confusion.   Change in vision, such as trouble seeing in one or both eyes.   Dizziness, loss of balance, or difficulty walking.   Severe headache.  ANY OF THESE SYMPTOMS MAY REPRESENT A SERIOUS PROBLEM THAT IS AN EMERGENCY. Do not wait to see if the symptoms will go away. Get medical help at once. Call your local emergency services (911 in [place] IMMEDIATELY. DO NOT drive yourself to the hospital. RISK FACTORS Risk factors can increase the risk of developing a TIA. These can include.   High blood pressure (hypertension).   High cholesterol (hyperlipidemia).   Heart disease (atherosclerosis).   Smoking.   Diabetes.   Abnormal heart rhythm (atrial fibrillation).   Family history of a stroke or heart attack.   Use of oral contraceptives (especially when combined with smoking).  DIAGNOSIS   A TIA can be diagnosed based on your:   Symptoms.   History.   Risk factors.   Tests that can help diagnose the symptoms of a TIA include:   CT or MRI scan. These tests can provide detailed images of the  brain.   Carotid ultrasound. This test looks to see if there are blockages in the carotid arteries of your neck.   Arteriography. A thin, small flexible tube (catheter) is inserted through a small cut (incision) in your groin. The catheter is threaded to your carotid or vertebral artery. A dye is then injected into the catheter. The dye highlights the arteries in your brain and allows your caregiver to look for narrowing or blockages that can cause a TIA.  TREATMENT  Based on the cause of a TIA, treatment options can vary. Treatment is important to help prevent a stroke. Treatment options can include:  Medication. Such as:   Clot-busting medicine.   Anti-platelet medicine.   Blood pressure medicine.   Blood thinner medicine.   Surgery:   Carotid endarterectomy. The carotid arteries are the arteries that supply the head and neck with oxygenated blood. This surgery can help remove fatty deposits (plaque) in the carotid arteries.   Angioplasty and stenting. This surgery uses a balloon to dilate a blocked artery in the brain. A stent is a small, metal mesh tube that can help keep an artery open  HOME CARE INSTRUCTIONS   It is important to take all medicine as told by your caregiver. If the medicine has side effects that affect you negatively, tell your caregiver right away. Do not stop taking medicine unless told   by your caregiver. Some medicines may need to be changed to better treat your condition.   Do not smoke. Talk to your caregiver on how to quit smoking.   Eat a diet high in fruits, vegetables and lean meat. Avoid a high fat, high salt diet. A dietician can you help you make healthy food choices.   Maintain a healthy weight. Develop an exercise plan approved by your caregiver.  SEEK IMMEDIATE MEDICAL CARE IF:   You develop weakness or numbness on one side of your body.   You have problems thinking, speaking, or feel confused.   You have vision changes.   You feel dizzy,  have trouble walking, or lose your balance.   You develop a severe headache.  MAKE SURE YOU:   Understand these instructions.   Will watch your condition.   Will get help right away if you are not doing well or get worse.  Document Released: 07/16/2005 Document Revised: 09/25/2011 Document Reviewed: 11/29/2009 [employer] Patient Information 2012 [employer] 

## 2012-03-04 NOTE — Progress Notes (Signed)
  Subjective:    Patient ID: [name], female    DOB: 1945/11/28, 66 y.o.   MRN: [id number]  Hypertension This is a chronic problem. The current episode started more than 1 year ago. The problem has been waxing and waning since onset. The problem is controlled. Associated symptoms include anxiety. The current treatment provides moderate improvement.  #2 TIA.patient diagnosed with TIA. She is currently on Plavix taking a initially thought a adult aspirin but did talk with patient and currently she is a baby aspirin 61 mg.strongly suggest she is to hold of her neurologist to see if they want to maintain her on both the medications. #3hyperlipidemia we'll need to check a lipid panel to make sure current cholesterol medication is working. #4 she reports having trouble with her memory.   Review of Systems  Constitutional: Negative.   Psychiatric/Behavioral:       Memory loss  All other systems reviewed and are negative.      BP 138/65  Pulse 72  Ht 5\' 4"  (1.626 m)  Wt 151 lb (68.493 kg)  BMI 25.92 kg/m2  SpO2 95% Objective:   Physical Exam  Constitutional: She is oriented to person, place, and time. She appears well-developed and well-nourished.  HENT:  Head: Normocephalic and atraumatic.  Neck: Neck supple. No tracheal deviation present. No thyromegaly present.  Cardiovascular: Normal rate and regular rhythm.  Exam reveals no friction rub.   No murmur heard. Pulmonary/Chest: Effort normal and breath sounds normal. No respiratory distress. She has no wheezes.  Musculoskeletal: Normal range of motion.  Neurological: She is alert and oriented to person, place, and time.  Skin: Skin is warm and dry.  Psychiatric: She has a normal mood and affect. Judgment normal.       Sometimes she demonstrated a lack of knowledge of what medications she is currently taking and what medicines she can't tolerate.          Assessment & Plan:  #1 hypertension maintain on Norvasc 5 mg a  day. #2 hyperlipidemia Lipitor 10 mg a day #3 memory loss which try her on the new DHA EPA fish all see if it makes a difference for memory  #4 depression renew Pristiq #5 hypothyroidism was not checked at now but we'll renew her thyroid medication. Return for followup in 4 months.

## 2012-03-25 ENCOUNTER — Ambulatory Visit (INDEPENDENT_AMBULATORY_CARE_PROVIDER_SITE_OTHER): Payer: Medicare Other | Admitting: Critical Care Medicine

## 2012-03-25 ENCOUNTER — Encounter: Payer: Self-pay | Admitting: Critical Care Medicine

## 2012-03-25 VITALS — BP 152/80 | HR 88 | Temp 98.9°F | Ht 64.0 in | Wt 147.0 lb

## 2012-03-25 DIAGNOSIS — C3490 Malignant neoplasm of unspecified part of unspecified bronchus or lung: Secondary | ICD-10-CM | POA: Diagnosis not present

## 2012-03-25 DIAGNOSIS — J4489 Other specified chronic obstructive pulmonary disease: Secondary | ICD-10-CM

## 2012-03-25 DIAGNOSIS — J449 Chronic obstructive pulmonary disease, unspecified: Secondary | ICD-10-CM

## 2012-03-25 MED ORDER — ALBUTEROL SULFATE (2.5 MG/3ML) 0.083% IN NEBU: 2.5000 mg | INHALATION_SOLUTION | Freq: Four times a day (QID) | RESPIRATORY_TRACT | Status: DC | PRN

## 2012-03-25 MED ORDER — TIOTROPIUM BROMIDE MONOHYDRATE 18 MCG IN CAPS: 18.0000 ug | ORAL_CAPSULE | Freq: Every day | RESPIRATORY_TRACT | Status: DC

## 2012-03-25 MED ORDER — ALBUTEROL SULFATE HFA 108 (90 BASE) MCG/ACT IN AERS: 2.0000 | INHALATION_SPRAY | Freq: Four times a day (QID) | RESPIRATORY_TRACT | Status: DC | PRN

## 2012-03-25 MED ORDER — BECLOMETHASONE DIPROPIONATE 40 MCG/ACT IN AERS: 2.0000 | INHALATION_SPRAY | Freq: Two times a day (BID) | RESPIRATORY_TRACT | Status: DC

## 2012-03-25 MED ORDER — FAMOTIDINE 20 MG PO TABS: 40.0000 mg | ORAL_TABLET | Freq: Every day | ORAL | Status: DC

## 2012-03-25 MED ORDER — ARFORMOTEROL TARTRATE 15 MCG/2ML IN NEBU: 15.0000 ug | INHALATION_SOLUTION | Freq: Two times a day (BID) | RESPIRATORY_TRACT | Status: DC

## 2012-03-25 NOTE — Progress Notes (Signed)
Subjective:    Patient ID: [name], female    DOB: [birth year], 66 y.o.   MRN: [id number]  HPI  66 y.o.  woman with COPD primary emphysema Gold Stage III FeV1 39%  Pt with hx of Lung Ca dx 2005 and partial resection per Dr Edwyna Shell. No hx of COPD until LUL partial resection. Had XRT implants.    03/25/2012 Pt seen 3/13  rx doxy/pred and did well Now dyspnea at baseline.  No real cough .   Dyspnea is at baseline.  Pt notes heartburn daily. Pt denies any significant sore throat, nasal congestion or excess secretions, fever, chills, sweats, unintended weight loss, pleurtic or exertional chest pain, orthopnea PND, or leg swelling Pt denies any increase in rescue therapy over baseline, denies waking up needing it or having any early am or nocturnal exacerbations of coughing/wheezing/or dyspnea. Pt also denies any obvious fluctuation in symptoms with  weather or environmental change or other alleviating or aggravating factors     Past Medical History  Diagnosis Date  . CAD (coronary artery disease)     20% stenosis on cath - no intervention required (W-S cards), negative nuclear stress test 11/07  . Hurthle cell adenoma of thyroid 2007  . Cancer 2007    lung (Dr. Shirline Frees and Dr. Edwyna Shell)  . RLS (restless legs syndrome)   . Peripheral vascular disease   . Iron deficiency anemia 2/08    s/p 2 unit transfusion  . Hypertension   . COPD (chronic obstructive pulmonary disease) 9/10    golds stage III, FeV1 39%  . Lung cancer   . Bronchopleural fistula      Family History  Problem Relation Age of Onset  . Cancer Mother     Larynx & Endometrial cancer  . Cancer Father     laryngeal cancer  . Hypertension Father   . Diabetes Sister   . Multiple sclerosis Sister   . Cancer Sister 9    breast     History   Social History  . Marital Status: Married    Spouse Name: N/A    Number of Children: N/A  . Years of Education: N/A   Occupational History  . Not on file.   Social  History Main Topics  . Smoking status: Former Smoker -- 1.5 packs/day for 30 years    Types: Cigarettes    Quit date: 10/20/2004  . Smokeless tobacco: Never Used  . Alcohol Use: No  . Drug Use: No  . Sexually Active: Not on file   Other Topics Concern  . Not on file   Social History Narrative  . No narrative on file     Allergies  Allergen Reactions  . Lisinopril-Hydrochlorothiazide     REACTION: N/V cough  . Nitrofurantoin Monohyd Macro   . Penicillins     REACTION: Hives     Outpatient Prescriptions Prior to Visit  Medication Sig Dispense Refill  . ALPRAZolam (XANAX) 1 MG tablet Take 1 tab by mouth once daily as needed for anxiety  90 tablet  0  . amLODipine (NORVASC) 5 MG tablet Take 1 tablet (5 mg total) by mouth daily.  90 tablet  3  . atorvastatin (LIPITOR) 10 MG tablet Take 1 tablet (10 mg total) by mouth daily.  90 tablet  3  . celecoxib (CELEBREX) 200 MG capsule Take 1 capsule (200 mg total) by mouth daily.  90 capsule  3  . clopidogrel (PLAVIX) 75 MG tablet Take 75 mg by mouth  daily.      . desvenlafaxine (PRISTIQ) 50 MG 24 hr tablet Take 1 tablet (50 mg total) by mouth daily.  90 tablet  3  . levothyroxine (SYNTHROID, LEVOTHROID) 50 MCG tablet Take 1 tablet (50 mcg total) by mouth daily.  90 tablet  3  . multivitamin-iron-minerals-folic acid (CENTRUM) chewable tablet Chew 1 tablet by mouth daily.        . Phosphatidylserine-DHA-EPA 100-19.5-6.5 MG CAPS Take 1 capsule by mouth 1 day or 1 dose.  90 capsule  3  . QUEtiapine (SEROQUEL) 100 MG tablet Take 1 tablet (100 mg total) by mouth at bedtime.  90 tablet  3  . Spacer/Aero-Holding Chambers (AEROCHAMBER MV) inhaler Use as instructed with qvar and proventil       . albuterol (PROVENTIL HFA) 108 (90 BASE) MCG/ACT inhaler Inhale 2 puffs into the lungs 4 (four) times daily as needed. Shortness of breath  2 Inhaler  4  . albuterol (PROVENTIL) (2.5 MG/3ML) 0.083% nebulizer solution Take 3 mLs (2.5 mg total) by nebulization  every 6 (six) hours as needed.  75 mL  4  . arformoterol (BROVANA) 15 MCG/2ML NEBU Take 2 mLs (15 mcg total) by nebulization 2 (two) times daily. 2 ml neb BID  180 mL  4  . beclomethasone (QVAR) 40 MCG/ACT inhaler Inhale 2 puffs into the lungs 2 (two) times daily.  3 Inhaler  4  . tiotropium (SPIRIVA) 18 MCG inhalation capsule Place 1 capsule (18 mcg total) into inhaler and inhale daily.  90 capsule  4  . albuterol (PROVENTIL HFA;VENTOLIN HFA) 108 (90 BASE) MCG/ACT inhaler Inhale 2 puffs into the lungs every 4 (four) hours as needed for wheezing.  3 Inhaler  3  . BIOTIN PO Take by mouth.      Marland Kitchen omeprazole (PRILOSEC) 20 MG capsule Take 20 mg by mouth daily.        . predniSONE (DELTASONE) 10 MG tablet 4 tabs for 2 days, then 3 tabs for 2 days, 2 tabs for 2 days, then 1 tab for 2 days, then stop  20 tablet  0     Review of Systems  Constitutional:   No  weight loss, night sweats,  Fevers, chills, fatigue, lassitude. HEENT:   No headaches,  Difficulty swallowing,  Tooth/dental problems,  Sore throat,                No sneezing, itching, ear ache, nasal congestion, post nasal drip,   CV:  No chest pain,  Orthopnea, PND, swelling in lower extremities, anasarca, dizziness, palpitations  GI  No heartburn, indigestion, abdominal pain, nausea, vomiting, diarrhea, change in bowel habits, loss of appetite  Resp: Notes  shortness of breath with exertion not  at rest.  No excess mucus, no productive cough,  No non-productive cough,  No coughing up of blood.  No change in color of mucus.  No wheezing.  No chest wall deformity  Skin: no rash or lesions.  GU: no dysuria, change in color of urine, no urgency or frequency.  No flank pain.  MS:  No joint pain or swelling.  No decreased range of motion.  No back pain.  Psych:  No change in mood or affect. No depression or anxiety.  No memory loss.         Objective:   Physical Exam  Filed Vitals:   03/25/12 1507  BP: 152/80  Pulse: 88  Temp:  98.9 F (37.2 C)  TempSrc: Oral  Height: 5\' 4"  (1.626  m)  Weight: 147 lb (66.679 kg)  SpO2: 93%    Gen: Pleasant, well-nourished, in no distress,  normal affect  ENT: No lesions,  mouth clear,  oropharynx clear, no postnasal drip  Neck: No JVD, no TMG, no carotid bruits  Lungs: No use of accessory muscles, no dullness to percussion, distant BS  Cardiovascular: RRR, heart sounds normal, no murmur or gallops, no peripheral edema  Abdomen: soft and NT, no HSM,  BS normal  Musculoskeletal: No deformities, no cyanosis or clubbing  Neuro: alert, non focal  Skin: Warm, no lesions or rashes        Assessment & Plan:   COPD Gold stage C. COPD stable at this time Plan Maintain inhaled medications as prescribed Return 6 months Repeat CT scan of chest for lung cancer screening in September 2013  LUNG CANCER History non-small cell carcinoma of lung with radiation seed implantation and partial resection 2005 without evident recurrence September 2012. Stage Ia  Plan Repeat CT scan for screening annual a September 2013     Updated Medication List Outpatient Encounter Prescriptions as of 03/25/2012  Medication Sig Dispense Refill  . albuterol (PROVENTIL HFA) 108 (90 BASE) MCG/ACT inhaler Inhale 2 puffs into the lungs 4 (four) times daily as needed. Shortness of breath  2 Inhaler  4  . albuterol (PROVENTIL) (2.5 MG/3ML) 0.083% nebulizer solution Take 3 mLs (2.5 mg total) by nebulization every 6 (six) hours as needed.  75 mL  4  . ALPRAZolam (XANAX) 1 MG tablet Take 1 tab by mouth once daily as needed for anxiety  90 tablet  0  . amLODipine (NORVASC) 5 MG tablet Take 1 tablet (5 mg total) by mouth daily.  90 tablet  3  . arformoterol (BROVANA) 15 MCG/2ML NEBU Take 2 mLs (15 mcg total) by nebulization 2 (two) times daily. 2 ml neb BID  180 mL  4  . atorvastatin (LIPITOR) 10 MG tablet Take 1 tablet (10 mg total) by mouth daily.  90 tablet  3  . beclomethasone (QVAR) 40 MCG/ACT  inhaler Inhale 2 puffs into the lungs 2 (two) times daily.  3 Inhaler  4  . celecoxib (CELEBREX) 200 MG capsule Take 1 capsule (200 mg total) by mouth daily.  90 capsule  3  . clopidogrel (PLAVIX) 75 MG tablet Take 75 mg by mouth daily.      Marland Kitchen desvenlafaxine (PRISTIQ) 50 MG 24 hr tablet Take 1 tablet (50 mg total) by mouth daily.  90 tablet  3  . levothyroxine (SYNTHROID, LEVOTHROID) 50 MCG tablet Take 1 tablet (50 mcg total) by mouth daily.  90 tablet  3  . multivitamin-iron-minerals-folic acid (CENTRUM) chewable tablet Chew 1 tablet by mouth daily.        . Phosphatidylserine-DHA-EPA 100-19.5-6.5 MG CAPS Take 1 capsule by mouth 1 day or 1 dose.  90 capsule  3  . QUEtiapine (SEROQUEL) 100 MG tablet Take 1 tablet (100 mg total) by mouth at bedtime.  90 tablet  3  . Spacer/Aero-Holding Chambers (AEROCHAMBER MV) inhaler Use as instructed with qvar and proventil       . tiotropium (SPIRIVA) 18 MCG inhalation capsule Place 1 capsule (18 mcg total) into inhaler and inhale daily.  90 capsule  4  . DISCONTD: albuterol (PROVENTIL HFA) 108 (90 BASE) MCG/ACT inhaler Inhale 2 puffs into the lungs 4 (four) times daily as needed. Shortness of breath  2 Inhaler  4  . DISCONTD: albuterol (PROVENTIL) (2.5 MG/3ML) 0.083% nebulizer solution Take 3  mLs (2.5 mg total) by nebulization every 6 (six) hours as needed.  75 mL  4  . DISCONTD: arformoterol (BROVANA) 15 MCG/2ML NEBU Take 2 mLs (15 mcg total) by nebulization 2 (two) times daily. 2 ml neb BID  180 mL  4  . DISCONTD: beclomethasone (QVAR) 40 MCG/ACT inhaler Inhale 2 puffs into the lungs 2 (two) times daily.  3 Inhaler  4  . DISCONTD: tiotropium (SPIRIVA) 18 MCG inhalation capsule Place 1 capsule (18 mcg total) into inhaler and inhale daily.  90 capsule  4  . famotidine (PEPCID) 20 MG tablet Take 2 tablets (40 mg total) by mouth at bedtime.  90 tablet  4  . DISCONTD: albuterol (PROVENTIL HFA;VENTOLIN HFA) 108 (90 BASE) MCG/ACT inhaler Inhale 2 puffs into the lungs  every 4 (four) hours as needed for wheezing.  3 Inhaler  3  . DISCONTD: BIOTIN PO Take by mouth.      . DISCONTD: omeprazole (PRILOSEC) 20 MG capsule Take 20 mg by mouth daily.        Marland Kitchen DISCONTD: predniSONE (DELTASONE) 10 MG tablet 4 tabs for 2 days, then 3 tabs for 2 days, 2 tabs for 2 days, then 1 tab for 2 days, then stop  20 tablet  0

## 2012-03-25 NOTE — Assessment & Plan Note (Signed)
Gold stage C. COPD stable at this time Plan Maintain inhaled medications as prescribed Return 6 months Repeat CT scan of chest for lung cancer screening in September 2013

## 2012-03-25 NOTE — Assessment & Plan Note (Signed)
History non-small cell carcinoma of lung with radiation seed implantation and partial resection 2005 without evident recurrence September 2012. Stage Ia  Plan Repeat CT scan for screening annual a September 2013

## 2012-03-25 NOTE — Patient Instructions (Addendum)
No change in medications. CT Chest ordered for end of Sept 2013 Start Pepcid 40mg  at bedtime nightly Return in       6    months

## 2012-03-29 ENCOUNTER — Ambulatory Visit (INDEPENDENT_AMBULATORY_CARE_PROVIDER_SITE_OTHER): Payer: Medicare Other | Admitting: Physician Assistant

## 2012-03-29 ENCOUNTER — Encounter: Payer: Self-pay | Admitting: Physician Assistant

## 2012-03-29 VITALS — BP 123/76 | HR 75 | Temp 99.3°F | Ht 64.0 in | Wt 147.0 lb

## 2012-03-29 DIAGNOSIS — L259 Unspecified contact dermatitis, unspecified cause: Secondary | ICD-10-CM

## 2012-03-29 DIAGNOSIS — L218 Other seborrheic dermatitis: Secondary | ICD-10-CM | POA: Diagnosis not present

## 2012-03-29 DIAGNOSIS — L219 Seborrheic dermatitis, unspecified: Secondary | ICD-10-CM

## 2012-03-29 MED ORDER — TRIAMCINOLONE ACETONIDE 0.1 % EX OINT: TOPICAL_OINTMENT | Freq: Two times a day (BID) | CUTANEOUS | Status: DC

## 2012-03-29 MED ORDER — KETOCONAZOLE 2 % EX SHAM: MEDICATED_SHAMPOO | CUTANEOUS | Status: AC

## 2012-03-29 NOTE — Progress Notes (Signed)
  Subjective:    Patient ID: [name], female    DOB: 1946/10/17, 66 y.o.   MRN: [id number]  HPI Patient presents to clinic with itching scalp for weeks. Recently in the last 2-3 days she has tiny bumps on both arms, neck, and side of face that itch. She denies any pain. She has not had either before. She has tried benadryl cream on itchy rash and has helped some. She denies any changes in detergent, lotions, make up etc. She has been outside some lately but does not know of any allergies. She was exposed to scabies after a child was treated. Noone else in family has itchy rash.   She has also not tried anything for itchy scalp that has been going on for weeks. She has not tried anything. Nothing makes worse.    Review of Systems     Objective:   Physical Exam  Constitutional: She is oriented to person, place, and time. She appears well-developed and well-nourished.  HENT:  Head: Normocephalic and atraumatic.  Neurological: She is alert and oriented to person, place, and time.  Skin:       Clusters of erythematous tiny papules on side both arms, neck and both sides of face around jaw line.   Erythematous, circumscribed, scaly patches on scalp.  Psychiatric: She has a normal mood and affect. Her behavior is normal.          Assessment & Plan:  Contact dermatitis- Not exactly sure of cause. Even with exposure to a child with treated scabies is not presenting like scabies.Gave depo medrol shot 80mg  once. Gave Triamcinolone to apply to affected areas twice a day for 2 weeks. Call if not improving.  Seborrheic dermatitis- Nizoral shampoo was given to use twice a week for 8 weeks. Call if not improving.

## 2012-03-29 NOTE — Patient Instructions (Addendum)
Use triamcinolone cream on affected areas twice a day for no longer than 2 weeks. Use shampoo twice a week for 8 weeks. Gave depo medrol shot in office. Call if not improving.  Contact Dermatitis Contact dermatitis is a reaction to certain substances that touch the skin. Contact dermatitis can be either irritant contact dermatitis or allergic contact dermatitis. Irritant contact dermatitis does not require previous exposure to the substance for a reaction to occur.Allergic contact dermatitis only occurs if you have been exposed to the substance before. Upon a repeat exposure, your body reacts to the substance.  CAUSES  Many substances can cause contact dermatitis. Irritant dermatitis is most commonly caused by repeated exposure to mildly irritating substances, such as:  Makeup.   Soaps.   Detergents.   Bleaches.   Acids.   Metal salts, such as nickel.  Allergic contact dermatitis is most commonly caused by exposure to:  Poisonous plants.   Chemicals (deodorants, shampoos).   Jewelry.   Latex.   Neomycin in triple antibiotic cream.   Preservatives in products, including clothing.  SYMPTOMS  The area of skin that is exposed may develop:  Dryness or flaking.   Redness.   Cracks.   Itching.   Pain or a burning sensation.   Blisters.  With allergic contact dermatitis, there may also be swelling in areas such as the eyelids, mouth, or genitals.  DIAGNOSIS  Your caregiver can usually tell what the problem is by doing a physical exam. In cases where the cause is uncertain and an allergic contact dermatitis is suspected, a patch skin test may be performed to help determine the cause of your dermatitis. TREATMENT Treatment includes protecting the skin from further contact with the irritating substance by avoiding that substance if possible. Barrier creams, powders, and gloves may be helpful. Your caregiver may also recommend:  Steroid creams or ointments applied 2 times  daily. For best results, soak the rash area in cool water for 20 minutes. Then apply the medicine. Cover the area with a plastic wrap. You can store the steroid cream in the refrigerator for a "chilly" effect on your rash. That may decrease itching. Oral steroid medicines may be needed in more severe cases.   Antibiotics or antibacterial ointments if a skin infection is present.   Antihistamine lotion or an antihistamine taken by mouth to ease itching.   Lubricants to keep moisture in your skin.   Burow's solution to reduce redness and soreness or to dry a weeping rash. Mix one packet or tablet of solution in 2 cups cool water. Dip a clean washcloth in the mixture, wring it out a bit, and put it on the affected area. Leave the cloth in place for 30 minutes. Do this as often as possible throughout the day.   Taking several cornstarch or baking soda baths daily if the area is too large to cover with a washcloth.  Harsh chemicals, such as alkalis or acids, can cause skin damage that is like a burn. You should flush your skin for 15 to 20 minutes with cold water after such an exposure. You should also seek immediate medical care after exposure. Bandages (dressings), antibiotics, and pain medicine may be needed for severely irritated skin.  HOME CARE INSTRUCTIONS  Avoid the substance that caused your reaction.   Keep the area of skin that is affected away from hot water, soap, sunlight, chemicals, acidic substances, or anything else that would irritate your skin.   Do not scratch the rash. Scratching  may cause the rash to become infected.   You may take cool baths to help stop the itching.   Only take over-the-counter or prescription medicines as directed by your caregiver.   See your caregiver for follow-up care as directed to make sure your skin is healing properly.  SEEK MEDICAL CARE IF:   Your condition is not better after 3 days of treatment.   You seem to be getting worse.   You see  signs of infection such as swelling, tenderness, redness, soreness, or warmth in the affected area.   You have any problems related to your medicines.  Document Released: 10/03/2000 Document Revised: 09/25/2011 Document Reviewed: 03/11/2011 [facility] Patient Information 2012 [place], [place].  Seborrheic Dermatitis Seborrheic dermatitis involves pink or red skin with greasy, flaky scales. This is often found on the scalp, eyebrows, nose, bearded area, and on or behind the ears. It can also occur on the central chest. It often occurs where there are more oil (sebaceous) glands. This condition is also known as dandruff. When this condition affects a baby's scalp, it is called cradle cap. It may come and go for no known reason. It can occur at any time of life from infancy to old age. CAUSES  The cause is unknown. It is not the result of too little moisture or too much oil. In some people, seborrheic dermatitis flare-ups seem to be triggered by stress. It also commonly occurs in people with certain diseases such as Parkinson's disease or HIV/AIDS. SYMPTOMS   Thick scales on the scalp.   Redness on the face or in the armpits.   The skin may seem oily or dry, but moisturizers do not help.   In infants, seborrheic dermatitis appears as scaly redness that does not seem to bother the baby. In some babies, it affects only the scalp. In others, it also affects the neck creases, armpits, groin, or behind the ears.   In adults and adolescents, seborrheic dermatitis may affect only the scalp. It may look patchy or spread out, with areas of redness and flaking. Other areas commonly affected include:   Eyebrows.   Eyelids.   Forehead.   Skin behind the ears.   Outer ears.   Chest.   Armpits.   Nose creases.   Skin creases under the breasts.   Skin between the buttocks.   Groin.   Some adults and adolescents feel itching or burning in the affected areas.  DIAGNOSIS  Your caregiver can  usually tell what the problem is by doing a physical exam. TREATMENT   Cortisone (steroid) ointments, creams, and lotions can help decrease inflammation.   Babies can be treated with baby oil to soften the scales, then they may be washed with baby shampoo. If this does not help, medicated shampoos should work.   Adults can also use medicated shampoos.   Your caregiver may prescribe corticosteroid cream and shampoo containing an antifungal or yeast medicine (ketoconazole). Hydrocortisone or anti-yeast cream can be rubbed directly onto seborrheic dermatitis patches. Yeast does not cause seborrheic dermatitis, but it seems to add to the problem.  In infants, seborrheic dermatitis is often worst during the first year of life. It tends to disappear on its own as the child grows. However, it may return during the teenage years. In adults and adolescents, seborrheic dermatitis tends to be a long-lasting condition that comes and goes over many years. HOME CARE INSTRUCTIONS   Use prescribed medicines as directed.   In infants, do not aggressively  remove the scales or flakes on the scalp with a comb or by other means. This may lead to hair loss.  SEEK MEDICAL CARE IF:   The problem does not improve from the medicated shampoos, lotions, or other medicines given by your caregiver.   You have any other questions or concerns.  Document Released: 10/06/2005 Document Revised: 09/25/2011 Document Reviewed: 02/25/2010 [facility] Patient Information 2012 [place], [place].

## 2012-03-30 NOTE — Progress Notes (Signed)
Addended by: Ellsworth Lennox on: 03/30/2012 09:08 AM   Modules accepted: Orders

## 2012-04-09 ENCOUNTER — Telehealth: Payer: Self-pay | Admitting: *Deleted

## 2012-04-09 ENCOUNTER — Other Ambulatory Visit: Payer: Self-pay | Admitting: *Deleted

## 2012-04-09 NOTE — Telephone Encounter (Signed)
Pt calls stating she scheduled Derm apt for Monday bc rash she was seen for still has not resolved. After reading Lipitor SEs she thinks she may be allergic and lipitor is causing rash. I advised Pt to keep Derm apt and to stop lipitor. If rash resolves before her apt she could cancel apt and call our office for further cholesterol treatment. Pt will continue use of benadryl for Sxs.

## 2012-04-09 NOTE — Telephone Encounter (Signed)
Sounds perfect

## 2012-04-13 DIAGNOSIS — L259 Unspecified contact dermatitis, unspecified cause: Secondary | ICD-10-CM | POA: Diagnosis not present

## 2012-07-19 ENCOUNTER — Ambulatory Visit (INDEPENDENT_AMBULATORY_CARE_PROVIDER_SITE_OTHER)
Admission: RE | Admit: 2012-07-19 | Discharge: 2012-07-19 | Disposition: A | Discharge status: Home / Self Care | Payer: Medicare Other | Source: Ambulatory Visit | Attending: Critical Care Medicine | Admitting: Critical Care Medicine

## 2012-07-19 DIAGNOSIS — R918 Other nonspecific abnormal finding of lung field: Secondary | ICD-10-CM | POA: Diagnosis not present

## 2012-07-19 DIAGNOSIS — C3490 Malignant neoplasm of unspecified part of unspecified bronchus or lung: Secondary | ICD-10-CM | POA: Diagnosis not present

## 2012-07-20 ENCOUNTER — Telehealth: Payer: Self-pay | Admitting: Critical Care Medicine

## 2012-07-20 DIAGNOSIS — C3490 Malignant neoplasm of unspecified part of unspecified bronchus or lung: Secondary | ICD-10-CM

## 2012-07-20 DIAGNOSIS — R590 Localized enlarged lymph nodes: Secondary | ICD-10-CM

## 2012-07-20 NOTE — Telephone Encounter (Signed)
CT Chest showed mildly enlarged LN subcarinal. We will order a PET Scan. Pt is aware

## 2012-07-20 NOTE — Telephone Encounter (Signed)
Pet 07/28/12 pt aware Tobe Sos

## 2012-07-28 ENCOUNTER — Encounter (HOSPITAL_COMMUNITY)
Admission: RE | Admit: 2012-07-28 | Discharge: 2012-07-28 | Disposition: A | Discharge status: Home / Self Care | Payer: Medicare Other | Source: Ambulatory Visit | Attending: Critical Care Medicine | Admitting: Critical Care Medicine

## 2012-07-28 DIAGNOSIS — R590 Localized enlarged lymph nodes: Secondary | ICD-10-CM

## 2012-07-28 DIAGNOSIS — R599 Enlarged lymph nodes, unspecified: Secondary | ICD-10-CM | POA: Diagnosis not present

## 2012-07-28 DIAGNOSIS — C3490 Malignant neoplasm of unspecified part of unspecified bronchus or lung: Secondary | ICD-10-CM | POA: Diagnosis not present

## 2012-07-28 DIAGNOSIS — I708 Atherosclerosis of other arteries: Secondary | ICD-10-CM | POA: Diagnosis not present

## 2012-07-28 DIAGNOSIS — I700 Atherosclerosis of aorta: Secondary | ICD-10-CM | POA: Diagnosis not present

## 2012-07-28 LAB — GLUCOSE, CAPILLARY: Glucose-Capillary: 90 mg/dL (ref 70–99)

## 2012-07-28 MED ORDER — FLUDEOXYGLUCOSE F - 18 (FDG) INJECTION: 18.5000 | Freq: Once | INTRAVENOUS | Status: AC | PRN

## 2012-07-28 MED ORDER — FLUDEOXYGLUCOSE F - 18 (FDG) INJECTION
18.5000 | Freq: Once | INTRAVENOUS | Status: AC | PRN
  Administered 2012-07-28: 18.5 via INTRAVENOUS

## 2012-09-07 ENCOUNTER — Encounter: Payer: Self-pay | Admitting: Family Medicine

## 2012-09-07 ENCOUNTER — Ambulatory Visit (INDEPENDENT_AMBULATORY_CARE_PROVIDER_SITE_OTHER): Payer: Medicare Other | Admitting: Family Medicine

## 2012-09-07 VITALS — BP 141/73 | HR 70 | Resp 18 | Wt 146.0 lb

## 2012-09-07 DIAGNOSIS — H9201 Otalgia, right ear: Secondary | ICD-10-CM

## 2012-09-07 DIAGNOSIS — E7800 Pure hypercholesterolemia, unspecified: Secondary | ICD-10-CM | POA: Diagnosis not present

## 2012-09-07 DIAGNOSIS — E039 Hypothyroidism, unspecified: Secondary | ICD-10-CM

## 2012-09-07 DIAGNOSIS — R7309 Other abnormal glucose: Secondary | ICD-10-CM

## 2012-09-07 DIAGNOSIS — R739 Hyperglycemia, unspecified: Secondary | ICD-10-CM

## 2012-09-07 DIAGNOSIS — Z23 Encounter for immunization: Secondary | ICD-10-CM

## 2012-09-07 DIAGNOSIS — H9209 Otalgia, unspecified ear: Secondary | ICD-10-CM

## 2012-09-07 DIAGNOSIS — F341 Dysthymic disorder: Secondary | ICD-10-CM

## 2012-09-07 DIAGNOSIS — F419 Anxiety disorder, unspecified: Secondary | ICD-10-CM

## 2012-09-07 DIAGNOSIS — F32A Depression, unspecified: Secondary | ICD-10-CM

## 2012-09-07 MED ORDER — ALPRAZOLAM 1 MG PO TABS: ORAL_TABLET | ORAL | Status: DC

## 2012-09-07 MED ORDER — CIPROFLOXACIN-DEXAMETHASONE 0.3-0.1 % OT SUSP: 4.0000 [drp] | Freq: Two times a day (BID) | OTIC | Status: DC

## 2012-09-07 MED ORDER — QUETIAPINE FUMARATE 50 MG PO TABS: 150.0000 mg | ORAL_TABLET | Freq: Every day | ORAL | Status: DC

## 2012-09-07 NOTE — Progress Notes (Signed)
Subjective:    Patient ID: [name], female    DOB: [birth year], 66 y.o.   MRN: [id number]  HPI #1 patient was having some anxiety depression. She wants renewal of Xanax  She states that she has been out of her Xanax for several months.She uses Seroquel 100 mg but that's the only medicine she uses for depression. We increased her from 5200 mg several months ago.  #2 hypertension. Reports no problems with current blood pressure treatment.  #3 hyperlipidemia she not on cholesterol medicine at this time but has had some elevation of her cholesterol before.  #4 elevated blood sugar.  #5 history of hypothyroidism. She questions if she needs to have her TSH checked since it has been a while.  #6 memory loss. Does not appear to be an issue at this time. The fish oil that were recommended apparently was never purchased.    #7 immunization due she needs to have a flu shot  #8 she reports having right ear pain several days ago that was quite disturbing. The pain has gotten better lately.   Review of Systems  HENT: Positive for ear pain.   Psychiatric/Behavioral: Positive for sleep disturbance and dysphoric mood. Negative for suicidal ideas.      BP 141/73  Pulse 70  Resp 18  Wt 146 lb (66.225 kg)  SpO2 95% Objective:   Physical Exam  Vitals reviewed. Constitutional: She is oriented to person, place, and time. She appears well-developed and well-nourished.  HENT:  Head: Normocephalic.       Left ear negative but the right ear has some tenderness in the ear canal with examination with otoscope but notes frank lesion or redness noted  Neck: Neck supple. No thyromegaly present.  Cardiovascular: Normal rate and regular rhythm.   Pulmonary/Chest: Effort normal and breath sounds normal.  Neurological: She is alert and oriented to person, place, and time.  Skin: Skin is warm and dry.  Psychiatric: She has a normal mood and affect.       Mood and affect appear normal but patient scored  her GAD-7 at a high level of 20 and her PHQ-9 was a total of 18 the same time walking it difficult at all. She also notes that also should be better off dead or hurting herself was 0.      Assessment & Plan:  #1 anxiety depression. With the high GAD 7 and PHQ-9 I will increase her Seroquel from 100 to 200. Even though explained to her she can take this at night she was still concerned this might be too strong for her. We will go to the 50 mg tablet increase it to 3 tablets or 150 mg and if she can tolerate that but feels that she needs something stronger she can then increase to 4 tablets or 200 mg at night. We'll renew her Xanax as well and because of this I recommend she returns for followup in 2-3 months.  #2 hypertension we'll maintain current blood pressure treatment.  #3 hyperlipidemia. She's eaten something today we'll have her come back tomorrow or sometime this week for lipid panel.  #4 elevated blood sugar. She's had elevation of her blood sugar before we'll get a A1c at this time.  #5 his been over 6 months since she's had a TSH we'll check a TSH on her.  #6 memory loss. We have offered her the fish oil with DHE but apparently issues not a major concern since she never did take the supplement/medication.  #  7 immunization update. Flu shot will be given.  #8 ear pain/otitis externa. We'll give her Ciprodex eardrops prescription at the local pharmacy that she can try especially if her ear starts hurting again.  Return in 2 months.  Patient also informed of her provider departure from this practice in the very near future.

## 2012-09-07 NOTE — Patient Instructions (Signed)

## 2012-09-08 DIAGNOSIS — F341 Dysthymic disorder: Secondary | ICD-10-CM | POA: Diagnosis not present

## 2012-09-09 LAB — COMPLETE METABOLIC PANEL WITH GFR
ALT: 18 U/L (ref 0–35)
AST: 22 U/L (ref 0–37)
Albumin: 4.2 g/dL (ref 3.5–5.2)
Alkaline Phosphatase: 81 U/L (ref 39–117)
BUN: 9 mg/dL (ref 6–23)
CO2: 32 mEq/L (ref 19–32)
Calcium: 9.1 mg/dL (ref 8.4–10.5)
Chloride: 102 mEq/L (ref 96–112)
Creat: 0.49 mg/dL — ABNORMAL LOW (ref 0.50–1.10)
GFR, Est African American: >89 mL/min
GFR, Est Non African American: >89 mL/min
Glucose, Bld: 82 mg/dL (ref 70–99)
Potassium: 3.7 mEq/L (ref 3.5–5.3)
Sodium: 138 mEq/L (ref 135–145)
Total Bilirubin: 0.5 mg/dL (ref 0.3–1.2)
Total Protein: 6.5 g/dL (ref 6.0–8.3)

## 2012-09-09 LAB — LIPID PANEL
Cholesterol: 152 mg/dL (ref 0–200)
HDL: 68 mg/dL (ref >39)
LDL Cholesterol: 67 mg/dL (ref 0–99)
Total CHOL/HDL Ratio: 2.2 Ratio
Triglycerides: 84 mg/dL (ref <150)
VLDL: 17 mg/dL (ref 0–40)

## 2012-09-09 LAB — CBC WITH DIFFERENTIAL/PLATELET
Basophils Absolute: 0 10*3/uL (ref 0.0–0.1)
Basophils Relative: 0 % (ref 0–1)
Eosinophils Absolute: 0.2 10*3/uL (ref 0.0–0.7)
Eosinophils Relative: 2 % (ref 0–5)
HCT: 37.3 % (ref 36.0–46.0)
Hemoglobin: 12.9 g/dL (ref 12.0–15.0)
Lymphocytes Relative: 26 % (ref 12–46)
Lymphs Abs: 1.8 10*3/uL (ref 0.7–4.0)
MCH: 31.1 pg (ref 26.0–34.0)
MCHC: 34.6 g/dL (ref 30.0–36.0)
MCV: 89.9 fL (ref 78.0–100.0)
Monocytes Absolute: 0.8 10*3/uL (ref 0.1–1.0)
Monocytes Relative: 12 % (ref 3–12)
Neutro Abs: 4 10*3/uL (ref 1.7–7.7)
Neutrophils Relative %: 60 % (ref 43–77)
Platelets: 316 10*3/uL (ref 150–400)
RBC: 4.15 MIL/uL (ref 3.87–5.11)
RDW: 13.3 % (ref 11.5–15.5)
WBC: 6.7 10*3/uL (ref 4.0–10.5)

## 2012-09-09 LAB — HEMOGLOBIN A1C
Hgb A1c MFr Bld: 5.6 % (ref <5.7)
Mean Plasma Glucose: 114 mg/dL (ref <117)

## 2012-09-09 LAB — TSH: TSH: 1.29 u[IU]/mL (ref 0.350–4.500)

## 2012-11-08 ENCOUNTER — Ambulatory Visit (INDEPENDENT_AMBULATORY_CARE_PROVIDER_SITE_OTHER): Payer: Medicare Other | Admitting: Physician Assistant

## 2012-11-08 ENCOUNTER — Encounter: Payer: Self-pay | Admitting: Physician Assistant

## 2012-11-08 VITALS — BP 147/80 | HR 87 | Wt 145.0 lb

## 2012-11-08 DIAGNOSIS — F341 Dysthymic disorder: Secondary | ICD-10-CM | POA: Diagnosis not present

## 2012-11-08 DIAGNOSIS — F329 Major depressive disorder, single episode, unspecified: Secondary | ICD-10-CM

## 2012-11-08 DIAGNOSIS — F32A Depression, unspecified: Secondary | ICD-10-CM

## 2012-11-08 DIAGNOSIS — I70219 Atherosclerosis of native arteries of extremities with intermittent claudication, unspecified extremity: Secondary | ICD-10-CM

## 2012-11-08 DIAGNOSIS — K219 Gastro-esophageal reflux disease without esophagitis: Secondary | ICD-10-CM

## 2012-11-08 DIAGNOSIS — I10 Essential (primary) hypertension: Secondary | ICD-10-CM | POA: Diagnosis not present

## 2012-11-08 DIAGNOSIS — E890 Postprocedural hypothyroidism: Secondary | ICD-10-CM | POA: Diagnosis not present

## 2012-11-08 MED ORDER — QUETIAPINE FUMARATE 50 MG PO TABS: 150.0000 mg | ORAL_TABLET | Freq: Every day | ORAL | Status: DC

## 2012-11-08 MED ORDER — CLOPIDOGREL BISULFATE 75 MG PO TABS: 75.0000 mg | ORAL_TABLET | Freq: Every day | ORAL | Status: DC

## 2012-11-08 MED ORDER — CELECOXIB 200 MG PO CAPS: 200.0000 mg | ORAL_CAPSULE | Freq: Every day | ORAL | Status: DC

## 2012-11-08 MED ORDER — FAMOTIDINE 20 MG PO TABS: 40.0000 mg | ORAL_TABLET | Freq: Every day | ORAL | Status: DC

## 2012-11-08 MED ORDER — ALPRAZOLAM 1 MG PO TABS: ORAL_TABLET | ORAL | Status: DC

## 2012-11-08 MED ORDER — AMLODIPINE BESYLATE 5 MG PO TABS: 5.0000 mg | ORAL_TABLET | Freq: Every day | ORAL | Status: DC

## 2012-11-08 MED ORDER — LEVOTHYROXINE SODIUM 50 MCG PO TABS: 50.0000 ug | ORAL_TABLET | Freq: Every day | ORAL | Status: DC

## 2012-11-08 MED ORDER — DESVENLAFAXINE SUCCINATE ER 50 MG PO TB24: 50.0000 mg | ORAL_TABLET | Freq: Every day | ORAL | Status: DC

## 2012-11-08 NOTE — Patient Instructions (Addendum)

## 2012-11-08 NOTE — Progress Notes (Signed)
  Subjective:    Patient ID: [name], female    DOB: [birth year], 67 y.o.   MRN: [id number]  HPI Patient is a 67 yo female who presents to the clinic to establish with myself and get medication refills.  GERD is ongoing and very stable on pepcid. NO associated symptoms.   HTN is ongoing and stable. She did not take her blood pressure yet this morning. She takes her blood pressure at home and top number running in the 120's and bottom number in the 80's. She tries to maintain low salt diet and stay very active. Denies any CP, palpitations, HA's, vision changes.  Depression ongoing and well controlled on pristiq. Denies any suicidal thoughts. For anxiety patient does take xanax as needed. Needs refill.   Needs refill on thyroid medication. Hypothyroidism is ongoing and being checked every 6-9 months. Denies any heat or cold intolerances, hair loss, fatigue symptoms of thyroid being off.   COPD is stable and being managed by Dr. Reich(pulmonologist).  Ischemic heart. Denies any CP, muscle weakness, SOB. Taking plavix daily. Staying active but denies any focused exercise.   Insomnia is ongoing but well controlled on seroquel. Needs refill today.No associated factors.   Osteoarthritis is well controlled. She uses celebrex as needed for pain.      Review of Systems  Constitutional: Negative.   HENT: Negative.   Respiratory: Negative.   Cardiovascular: Negative.   Gastrointestinal: Negative.   Skin: Negative.   Psychiatric/Behavioral: Negative.        Objective:   Physical Exam  Constitutional: She is oriented to person, place, and time. She appears well-developed and well-nourished.  HENT:  Head: Normocephalic and atraumatic.  Eyes: Conjunctivae normal are normal.  Neck: Normal range of motion. Neck supple. No thyromegaly present.  Cardiovascular: Normal rate, regular rhythm and normal heart sounds.   Pulmonary/Chest: Effort normal and breath sounds normal. She has no  wheezes.  Neurological: She is alert and oriented to person, place, and time.  Skin: Skin is warm and dry.  Psychiatric: She has a normal mood and affect. Her behavior is normal.          Assessment & Plan:  HTN/PVD- Refilled Norvasc. Continue to keep low salt diet. Monitor BP and make sure around goal of 120/80. Follow up in 3 months or as needed.  Hypothyroidism- will refill levothyroxine. Will check labs in 3 months.  GERD- Refilled pepcid.  Osteoarthritis- Refilled Celebrex to use as needed.  Ischemic heart- Refilled Plavix. Keeping an eye on Lipid profile. Will continue to monitor.   Depression- Pristiq refilled.   Insomnia- Seroquel refilled.  Discussed need for Pneumonia vaccine. Gave pt information to look at.   Come back in 3 months can also do a CPE.

## 2012-11-18 ENCOUNTER — Ambulatory Visit: Payer: Medicare Other | Admitting: Critical Care Medicine

## 2012-11-29 ENCOUNTER — Ambulatory Visit (INDEPENDENT_AMBULATORY_CARE_PROVIDER_SITE_OTHER): Payer: Medicare Other | Admitting: Critical Care Medicine

## 2012-11-29 ENCOUNTER — Encounter: Payer: Self-pay | Admitting: Critical Care Medicine

## 2012-11-29 VITALS — BP 146/74 | HR 65 | Temp 98.4°F | Ht 64.0 in | Wt 148.5 lb

## 2012-11-29 DIAGNOSIS — J449 Chronic obstructive pulmonary disease, unspecified: Secondary | ICD-10-CM | POA: Diagnosis not present

## 2012-11-29 DIAGNOSIS — C3490 Malignant neoplasm of unspecified part of unspecified bronchus or lung: Secondary | ICD-10-CM

## 2012-11-29 DIAGNOSIS — Z23 Encounter for immunization: Secondary | ICD-10-CM | POA: Diagnosis not present

## 2012-11-29 MED ORDER — HYDROCODONE-HOMATROPINE 5-1.5 MG/5ML PO SYRP: 5.0000 mL | ORAL_SOLUTION | Freq: Four times a day (QID) | ORAL | Status: DC | PRN

## 2012-11-29 MED ORDER — ALBUTEROL SULFATE HFA 108 (90 BASE) MCG/ACT IN AERS: 2.0000 | INHALATION_SPRAY | Freq: Four times a day (QID) | RESPIRATORY_TRACT | Status: DC | PRN

## 2012-11-29 MED ORDER — TIOTROPIUM BROMIDE MONOHYDRATE 18 MCG IN CAPS: 18.0000 ug | ORAL_CAPSULE | Freq: Every day | RESPIRATORY_TRACT | Status: DC

## 2012-11-29 MED ORDER — ARFORMOTEROL TARTRATE 15 MCG/2ML IN NEBU: 15.0000 ug | INHALATION_SOLUTION | Freq: Two times a day (BID) | RESPIRATORY_TRACT | Status: DC

## 2012-11-29 MED ORDER — PREDNISONE 10 MG PO TABS: ORAL_TABLET | ORAL | Status: DC

## 2012-11-29 MED ORDER — BECLOMETHASONE DIPROPIONATE 40 MCG/ACT IN AERS: 2.0000 | INHALATION_SPRAY | Freq: Two times a day (BID) | RESPIRATORY_TRACT | Status: DC

## 2012-11-29 NOTE — Assessment & Plan Note (Signed)
Gold stage C. COPD status post right upper lobe resection for lung carcinoma in 2007 with no evidence of recurrence on PET scan October 2013 Plan Pneumonia vaccine today Hycodan was given for cough suppression Maintain inhaled medications as prescribed and refills given

## 2012-11-29 NOTE — Progress Notes (Signed)
Subjective:    Patient ID: [name], female    DOB: [birth year], 67 y.o.   MRN: [id number]  HPI   Review of Systems     Physical Exam Subjective:  67 y.o.  woman with COPD primary emphysema Gold Stage III FeV1 39%  Pt with hx of Lung Ca dx 2005 and partial resection per Dr Edwyna Shell. No hx of COPD until LUL partial resection. Had XRT implants.    03/25/2012 Pt seen 3/13  rx doxy/pred and did well Now dyspnea at baseline.  No real cough .   Dyspnea is at baseline.  Pt notes heartburn daily. Pt denies any significant sore throat, nasal congestion or excess secretions, fever, chills, sweats, unintended weight loss, pleurtic or exertional chest pain, orthopnea PND, or leg swelling Pt denies any increase in rescue therapy over baseline, denies waking up needing it or having any early am or nocturnal exacerbations of coughing/wheezing/or dyspnea. Pt also denies any obvious fluctuation in symptoms with  weather or environmental change or other alleviating or aggravating factors   11/29/2012 Not much mucus.  Dyspnea is the same.  No real wheeze.  No chest pain  No edema in feet Pt denies any significant sore throat, nasal congestion or excess secretions, fever, chills, sweats, unintended weight loss, pleurtic or exertional chest pain, orthopnea PND, or leg swelling Pt denies any increase in rescue therapy over baseline, denies waking up needing it or having any early am or nocturnal exacerbations of coughing/wheezing/or dyspnea. Pt also denies any obvious fluctuation in symptoms with  weather or environmental change or other alleviating or aggravating factors      Past Medical History  Diagnosis Date  . CAD (coronary artery disease)     20% stenosis on cath - no intervention required (W-S cards), negative nuclear stress test 11/07  . Hurthle cell adenoma of thyroid 2007  . Cancer 2007    lung (Dr. Shirline Frees and Dr. Edwyna Shell)  . RLS (restless legs syndrome)   . Peripheral vascular disease    . Iron deficiency anemia 2/08    s/p 2 unit transfusion  . Hypertension   . COPD (chronic obstructive pulmonary disease) 9/10    golds stage III, FeV1 39%  . Lung cancer   . Bronchopleural fistula      Family History  Problem Relation Age of Onset  . Cancer Mother     Larynx & Endometrial cancer  . Cancer Father     laryngeal cancer  . Hypertension Father   . Diabetes Sister   . Multiple sclerosis Sister   . Cancer Sister 52    breast     History   Social History  . Marital Status: Married    Spouse Name: N/A    Number of Children: N/A  . Years of Education: N/A   Occupational History  . Not on file.   Social History Main Topics  . Smoking status: Former Smoker -- 1.50 packs/day for 30 years    Types: Cigarettes    Quit date: 10/20/2004  . Smokeless tobacco: Never Used  . Alcohol Use: No  . Drug Use: No  . Sexually Active: Not on file   Other Topics Concern  . Not on file   Social History Narrative  . No narrative on file     Allergies  Allergen Reactions  . Lisinopril-Hydrochlorothiazide     REACTION: N/V cough  . Nitrofurantoin Monohyd Macro   . Penicillins     REACTION: Hives  . Lipitor (  Atorvastatin) Rash     Outpatient Prescriptions Prior to Visit  Medication Sig Dispense Refill  . albuterol (PROVENTIL) (2.5 MG/3ML) 0.083% nebulizer solution Take 3 mLs (2.5 mg total) by nebulization every 6 (six) hours as needed.  75 mL  4  . ALPRAZolam (XANAX) 1 MG tablet Take 1 tab by mouth once daily as needed for anxiety  90 tablet  0  . amLODipine (NORVASC) 5 MG tablet Take 1 tablet (5 mg total) by mouth daily.  90 tablet  3  . clopidogrel (PLAVIX) 75 MG tablet Take 1 tablet (75 mg total) by mouth daily.  90 tablet  2  . desvenlafaxine (PRISTIQ) 50 MG 24 hr tablet Take 1 tablet (50 mg total) by mouth daily.  90 tablet  2  . famotidine (PEPCID) 20 MG tablet Take 2 tablets (40 mg total) by mouth at bedtime.  90 tablet  2  . levothyroxine (SYNTHROID,  LEVOTHROID) 50 MCG tablet Take 1 tablet (50 mcg total) by mouth daily.  90 tablet  2  . multivitamin-iron-minerals-folic acid (CENTRUM) chewable tablet Chew 1 tablet by mouth daily.        . QUEtiapine (SEROQUEL) 50 MG tablet Take 3-4 tablets (150-200 mg total) by mouth at bedtime.  360 tablet  2  . albuterol (PROVENTIL HFA) 108 (90 BASE) MCG/ACT inhaler Inhale 2 puffs into the lungs 4 (four) times daily as needed. Shortness of breath  2 Inhaler  4  . arformoterol (BROVANA) 15 MCG/2ML NEBU Take 2 mLs (15 mcg total) by nebulization 2 (two) times daily. 2 ml neb BID  180 mL  4  . beclomethasone (QVAR) 40 MCG/ACT inhaler Inhale 2 puffs into the lungs 2 (two) times daily.  3 Inhaler  4  . celecoxib (CELEBREX) 200 MG capsule Take 1 capsule (200 mg total) by mouth daily.  90 capsule  2  . tiotropium (SPIRIVA) 18 MCG inhalation capsule Place 1 capsule (18 mcg total) into inhaler and inhale daily.  90 capsule  4  . triamcinolone ointment (KENALOG) 0.1 % Apply topically 2 (two) times daily. To affected area for 2 weeks.  30 g  0  . Spacer/Aero-Holding Chambers (AEROCHAMBER MV) inhaler Use as instructed with qvar and proventil       . ciprofloxacin-dexamethasone (CIPRODEX) otic suspension Place 4 drops into the right ear 2 (two) times daily.  7.5 mL  0   No facility-administered medications prior to visit.       Objective:    Filed Vitals:   11/29/12 1003  BP: 146/74  Pulse: 65  Temp: 98.4 F (36.9 C)  TempSrc: Oral  Height: 5\' 4"  (1.626 m)  Weight: 148 lb 8 oz (67.359 kg)  SpO2: 98%    Gen: Pleasant, well-nourished, in no distress,  normal affect  ENT: No lesions,  mouth clear,  oropharynx clear, no postnasal drip  Neck: No JVD, no TMG, no carotid bruits  Lungs: No use of accessory muscles, no dullness to percussion, distant bs   Cardiovascular: RRR, heart sounds normal, no murmur or gallops, no peripheral edema  Abdomen: soft and NT, no HSM,  BS normal  Musculoskeletal: No  deformities, no cyanosis or clubbing  Neuro: alert, non focal  Skin: Warm, no lesions or rashes  No results found.     Assessment & Plan:   COPD gold stage C. Gold stage C. COPD status post right upper lobe resection for lung carcinoma in 2007 with no evidence of recurrence on PET scan October 2013 Plan Pneumonia  vaccine today Hycodan was given for cough suppression Maintain inhaled medications as prescribed and refills given  LUNG CANCER Mildly enlarged subcarinal LNs 07/20/2012>>PET Scan>>>NO recurrence Hx non small cell CA s/p resection 2005  with XRT seeds  Very gratifying to see this patient is now disease free since 2005    Updated Medication List Outpatient Encounter Prescriptions as of 11/29/2012  Medication Sig Dispense Refill  . albuterol (PROVENTIL HFA) 108 (90 BASE) MCG/ACT inhaler Inhale 2 puffs into the lungs 4 (four) times daily as needed. Shortness of breath  3 Inhaler  4  . albuterol (PROVENTIL) (2.5 MG/3ML) 0.083% nebulizer solution Take 3 mLs (2.5 mg total) by nebulization every 6 (six) hours as needed.  75 mL  4  . ALPRAZolam (XANAX) 1 MG tablet Take 1 tab by mouth once daily as needed for anxiety  90 tablet  0  . amLODipine (NORVASC) 5 MG tablet Take 1 tablet (5 mg total) by mouth daily.  90 tablet  3  . arformoterol (BROVANA) 15 MCG/2ML NEBU Take 2 mLs (15 mcg total) by nebulization 2 (two) times daily. 2 ml neb BID  180 mL  4  . beclomethasone (QVAR) 40 MCG/ACT inhaler Inhale 2 puffs into the lungs 2 (two) times daily.  3 Inhaler  4  . celecoxib (CELEBREX) 200 MG capsule Take 200 mg by mouth daily as needed.      . clopidogrel (PLAVIX) 75 MG tablet Take 1 tablet (75 mg total) by mouth daily.  90 tablet  2  . desvenlafaxine (PRISTIQ) 50 MG 24 hr tablet Take 1 tablet (50 mg total) by mouth daily.  90 tablet  2  . famotidine (PEPCID) 20 MG tablet Take 2 tablets (40 mg total) by mouth at bedtime.  90 tablet  2  . levothyroxine (SYNTHROID, LEVOTHROID) 50 MCG  tablet Take 1 tablet (50 mcg total) by mouth daily.  90 tablet  2  . multivitamin-iron-minerals-folic acid (CENTRUM) chewable tablet Chew 1 tablet by mouth daily.        . QUEtiapine (SEROQUEL) 50 MG tablet Take 3-4 tablets (150-200 mg total) by mouth at bedtime.  360 tablet  2  . tiotropium (SPIRIVA) 18 MCG inhalation capsule Place 1 capsule (18 mcg total) into inhaler and inhale daily.  90 capsule  4  . triamcinolone ointment (KENALOG) 0.1 % Apply topically as needed.      . [DISCONTINUED] albuterol (PROVENTIL HFA) 108 (90 BASE) MCG/ACT inhaler Inhale 2 puffs into the lungs 4 (four) times daily as needed. Shortness of breath  2 Inhaler  4  . [DISCONTINUED] arformoterol (BROVANA) 15 MCG/2ML NEBU Take 2 mLs (15 mcg total) by nebulization 2 (two) times daily. 2 ml neb BID  180 mL  4  . [DISCONTINUED] beclomethasone (QVAR) 40 MCG/ACT inhaler Inhale 2 puffs into the lungs 2 (two) times daily.  3 Inhaler  4  . [DISCONTINUED] celecoxib (CELEBREX) 200 MG capsule Take 1 capsule (200 mg total) by mouth daily.  90 capsule  2  . [DISCONTINUED] tiotropium (SPIRIVA) 18 MCG inhalation capsule Place 1 capsule (18 mcg total) into inhaler and inhale daily.  90 capsule  4  . [DISCONTINUED] triamcinolone ointment (KENALOG) 0.1 % Apply topically 2 (two) times daily. To affected area for 2 weeks.  30 g  0  . HYDROcodone-homatropine (HYCODAN) 5-1.5 MG/5ML syrup Take 5 mLs by mouth every 6 (six) hours as needed for cough.  120 mL  0  . predniSONE (DELTASONE) 10 MG tablet Take 4 for two days  three for two days two for two days one for two days  20 tablet  0  . Spacer/Aero-Holding Chambers (AEROCHAMBER MV) inhaler Use as instructed with qvar and proventil       . [DISCONTINUED] ciprofloxacin-dexamethasone (CIPRODEX) otic suspension Place 4 drops into the right ear 2 (two) times daily.  7.5 mL  0   No facility-administered encounter medications on file as of 11/29/2012.

## 2012-11-29 NOTE — Patient Instructions (Addendum)
Pneumonia vaccine today Hycodan was given for cough suppression Refills printed and given to you Prednisone for your trip given Return 6 months

## 2012-11-29 NOTE — Assessment & Plan Note (Signed)
Mildly enlarged subcarinal LNs 07/20/2012>>PET Scan>>>NO recurrence Hx non small cell CA s/p resection 2005  with XRT seeds  Very gratifying to see this patient is now disease free since 2005

## 2013-01-14 DIAGNOSIS — I10 Essential (primary) hypertension: Secondary | ICD-10-CM | POA: Diagnosis not present

## 2013-01-14 DIAGNOSIS — I6350 Cerebral infarction due to unspecified occlusion or stenosis of unspecified cerebral artery: Secondary | ICD-10-CM | POA: Diagnosis not present

## 2013-01-14 DIAGNOSIS — F341 Dysthymic disorder: Secondary | ICD-10-CM | POA: Diagnosis not present

## 2013-01-14 DIAGNOSIS — G459 Transient cerebral ischemic attack, unspecified: Secondary | ICD-10-CM | POA: Diagnosis not present

## 2013-03-02 ENCOUNTER — Ambulatory Visit (INDEPENDENT_AMBULATORY_CARE_PROVIDER_SITE_OTHER): Payer: Medicare Other | Admitting: Physician Assistant

## 2013-03-02 ENCOUNTER — Encounter: Payer: Self-pay | Admitting: Physician Assistant

## 2013-03-02 VITALS — BP 138/72 | HR 75 | Wt 142.0 lb

## 2013-03-02 DIAGNOSIS — R7309 Other abnormal glucose: Secondary | ICD-10-CM

## 2013-03-02 DIAGNOSIS — R7303 Prediabetes: Secondary | ICD-10-CM

## 2013-03-02 DIAGNOSIS — Z79899 Other long term (current) drug therapy: Secondary | ICD-10-CM | POA: Diagnosis not present

## 2013-03-02 DIAGNOSIS — E039 Hypothyroidism, unspecified: Secondary | ICD-10-CM | POA: Diagnosis not present

## 2013-03-02 LAB — COMPREHENSIVE METABOLIC PANEL
ALT: 18 U/L (ref 0–35)
AST: 22 U/L (ref 0–37)
Albumin: 4.2 g/dL (ref 3.5–5.2)
Alkaline Phosphatase: 88 U/L (ref 39–117)
BUN: 12 mg/dL (ref 6–23)
CO2: 29 mEq/L (ref 19–32)
Calcium: 9.4 mg/dL (ref 8.4–10.5)
Chloride: 99 mEq/L (ref 96–112)
Creat: 0.58 mg/dL (ref 0.50–1.10)
Glucose, Bld: 105 mg/dL — ABNORMAL HIGH (ref 70–99)
Potassium: 3.6 mEq/L (ref 3.5–5.3)
Sodium: 138 mEq/L (ref 135–145)
Total Bilirubin: 0.5 mg/dL (ref 0.3–1.2)
Total Protein: 6.4 g/dL (ref 6.0–8.3)

## 2013-03-02 LAB — HEMOGLOBIN A1C
Hgb A1c MFr Bld: 5.3 % (ref <5.7)
Mean Plasma Glucose: 105 mg/dL (ref <117)

## 2013-03-02 LAB — TSH: TSH: 1.317 u[IU]/mL (ref 0.350–4.500)

## 2013-03-02 MED ORDER — AMLODIPINE BESYLATE 5 MG PO TABS: 5.0000 mg | ORAL_TABLET | Freq: Every day | ORAL | Status: DC

## 2013-03-02 NOTE — Progress Notes (Signed)
  Subjective:    Patient ID: [name], female    DOB: 02/18/1946, 67 y.o.   MRN: [id number]  HPI Patient presents to the clinic to follow up on hypothyroidism and pre-diabetes.  Hypothyroidism- No problems or concerns. Takes medication daily.   Pre-diabetes- has sugars checked regularly. Trying to watch what she eats and make sure low in sugars and fats. Denies any exercise.   Recently seen by pulmonlogist for COPD. No changes in meds were made and good report. No exacerbations of lung function.   Review of Systems     Objective:   Physical Exam  Constitutional: She is oriented to person, place, and time. She appears well-developed and well-nourished.  HENT:  Head: Normocephalic and atraumatic.  Neck: Normal range of motion. Neck supple. No thyromegaly present.  Cardiovascular: Normal rate, regular rhythm and normal heart sounds.   Pulmonary/Chest: Effort normal and breath sounds normal.  Lymphadenopathy:    She has no cervical adenopathy.  Neurological: She is alert and oriented to person, place, and time.  Skin: Skin is warm and dry.  Psychiatric: She has a normal mood and affect. Her behavior is normal.          Assessment & Plan:  Hypothyroidism- will check TSH.   Pre-diabetes- will check A1C today. Continue good diet and exercise practices. Will check CMP.

## 2013-03-04 ENCOUNTER — Other Ambulatory Visit: Payer: Self-pay | Admitting: *Deleted

## 2013-03-04 ENCOUNTER — Telehealth: Payer: Self-pay | Admitting: *Deleted

## 2013-03-04 DIAGNOSIS — G459 Transient cerebral ischemic attack, unspecified: Secondary | ICD-10-CM

## 2013-03-04 MED ORDER — ATORVASTATIN CALCIUM 10 MG PO TABS: 10.0000 mg | ORAL_TABLET | Freq: Every day | ORAL | Status: DC

## 2013-03-04 NOTE — Telephone Encounter (Signed)
Ok to refill 90 day supply with one refill last checked in 11/13. I am not there so another doctor will have to send rx.

## 2013-03-04 NOTE — Telephone Encounter (Signed)
Pt calls today & states that she didn't realize that she was almost out of her atorvastatin calcium 10mg .  I looked in her chart & this is on her allergy list but she said that's not correct.  She states that she takes this every day & needs a print out script instead of one sent electronically.  Please advise.

## 2013-03-07 NOTE — Telephone Encounter (Signed)
rx printed

## 2013-03-09 ENCOUNTER — Ambulatory Visit: Payer: Medicare Other | Admitting: Physician Assistant

## 2013-03-15 ENCOUNTER — Other Ambulatory Visit: Payer: Self-pay | Admitting: *Deleted

## 2013-03-15 MED ORDER — LEVOTHYROXINE SODIUM 50 MCG PO TABS: 50.0000 ug | ORAL_TABLET | Freq: Every day | ORAL | Status: DC

## 2013-03-22 DIAGNOSIS — Z1231 Encounter for screening mammogram for malignant neoplasm of breast: Secondary | ICD-10-CM | POA: Diagnosis not present

## 2013-05-17 ENCOUNTER — Telehealth: Payer: Self-pay | Admitting: Critical Care Medicine

## 2013-05-17 NOTE — Telephone Encounter (Signed)
Needs CT Chest with contrast to f/u on lung cancer status

## 2013-05-17 NOTE — Telephone Encounter (Signed)
Spoke with patient-- She states per Dr. Delford Field she is to have a ct scan or cxr every 6 months Patient is wondering if she is to have one prior to her 06/09/13 OV w Dr. Delford Field I do not see any mention of this in her chart from last OV Dr. Delford Field please advise if patient needs to have this done, thank you  Last OV: 11/29/12 Patient Instructions    Pneumonia vaccine today  Hycodan was given for cough suppression  Refills printed and given to you  Prednisone for your trip given  Return 6 months

## 2013-05-17 NOTE — Telephone Encounter (Signed)
Order has been placed per PW. Pt will also need a BMP since this will be done with contrast. Pt is aware that we will get this scheduled for her and get back to her. Nothing further was needed at this time.

## 2013-05-30 ENCOUNTER — Other Ambulatory Visit: Payer: Self-pay | Admitting: Critical Care Medicine

## 2013-05-30 DIAGNOSIS — J449 Chronic obstructive pulmonary disease, unspecified: Secondary | ICD-10-CM | POA: Diagnosis not present

## 2013-05-31 LAB — BASIC METABOLIC PANEL
BUN: 8 mg/dL (ref 6–23)
CO2: 31 mEq/L (ref 19–32)
Calcium: 9.2 mg/dL (ref 8.4–10.5)
Chloride: 102 mEq/L (ref 96–112)
Creat: 0.51 mg/dL (ref 0.50–1.10)
Glucose, Bld: 89 mg/dL (ref 70–99)
Potassium: 3.9 mEq/L (ref 3.5–5.3)
Sodium: 140 mEq/L (ref 135–145)

## 2013-06-02 ENCOUNTER — Ambulatory Visit (HOSPITAL_BASED_OUTPATIENT_CLINIC_OR_DEPARTMENT_OTHER)
Admission: RE | Admit: 2013-06-02 | Discharge: 2013-06-02 | Disposition: A | Discharge status: Home / Self Care | Payer: Medicare Other | Source: Ambulatory Visit | Attending: Critical Care Medicine | Admitting: Critical Care Medicine

## 2013-06-02 DIAGNOSIS — Z902 Acquired absence of lung [part of]: Secondary | ICD-10-CM | POA: Diagnosis not present

## 2013-06-02 DIAGNOSIS — I700 Atherosclerosis of aorta: Secondary | ICD-10-CM | POA: Diagnosis not present

## 2013-06-02 DIAGNOSIS — C3490 Malignant neoplasm of unspecified part of unspecified bronchus or lung: Secondary | ICD-10-CM | POA: Insufficient documentation

## 2013-06-02 DIAGNOSIS — K449 Diaphragmatic hernia without obstruction or gangrene: Secondary | ICD-10-CM | POA: Diagnosis not present

## 2013-06-02 DIAGNOSIS — J9819 Other pulmonary collapse: Secondary | ICD-10-CM | POA: Diagnosis not present

## 2013-06-02 MED ORDER — IOHEXOL 350 MG/ML SOLN
100.0000 mL | Freq: Once | INTRAVENOUS | Status: AC | PRN
  Administered 2013-06-02: 100 mL via INTRAVENOUS

## 2013-06-09 ENCOUNTER — Encounter: Payer: Self-pay | Admitting: Critical Care Medicine

## 2013-06-09 ENCOUNTER — Ambulatory Visit (INDEPENDENT_AMBULATORY_CARE_PROVIDER_SITE_OTHER): Payer: Medicare Other | Admitting: Critical Care Medicine

## 2013-06-09 VITALS — BP 156/80 | HR 69 | Ht 64.0 in | Wt 147.0 lb

## 2013-06-09 DIAGNOSIS — C3490 Malignant neoplasm of unspecified part of unspecified bronchus or lung: Secondary | ICD-10-CM | POA: Diagnosis not present

## 2013-06-09 DIAGNOSIS — J449 Chronic obstructive pulmonary disease, unspecified: Secondary | ICD-10-CM

## 2013-06-09 MED ORDER — ALBUTEROL SULFATE (2.5 MG/3ML) 0.083% IN NEBU: 2.5000 mg | INHALATION_SOLUTION | Freq: Four times a day (QID) | RESPIRATORY_TRACT | Status: DC | PRN

## 2013-06-09 MED ORDER — ARFORMOTEROL TARTRATE 15 MCG/2ML IN NEBU: 15.0000 ug | INHALATION_SOLUTION | Freq: Two times a day (BID) | RESPIRATORY_TRACT | Status: DC

## 2013-06-09 MED ORDER — BECLOMETHASONE DIPROPIONATE 40 MCG/ACT IN AERS: 2.0000 | INHALATION_SPRAY | Freq: Two times a day (BID) | RESPIRATORY_TRACT | Status: DC

## 2013-06-09 MED ORDER — FAMOTIDINE 20 MG PO TABS: 40.0000 mg | ORAL_TABLET | Freq: Every day | ORAL | Status: DC

## 2013-06-09 MED ORDER — TIOTROPIUM BROMIDE MONOHYDRATE 18 MCG IN CAPS: 18.0000 ug | ORAL_CAPSULE | Freq: Every day | RESPIRATORY_TRACT | Status: DC

## 2013-06-09 NOTE — Progress Notes (Signed)
Subjective:    Patient ID: [name], female    DOB: 04/01/1946, 67 y.o.   MRN: [id number]  HPI    Review of Systems      Physical Exam  Subjective:  67 y.o.   woman with COPD primary emphysema Gold Stage III FeV1 39%  Pt with hx of Lung Ca dx 2005 and partial resection per Dr Edwyna Shell. No hx of COPD until LUL partial resection. Had XRT implants.    06/09/2013 Chief Complaint  Patient presents with  . 6 month follow up    Breathing is unchanged.  Does have DOE, a little wheezing, chest tightness with SOB, and nonprod cough.  Doing well, no issues, CT looked good without evidence of lung cancer recurrence Pt denies any significant sore throat, nasal congestion or excess secretions, fever, chills, sweats, unintended weight loss, pleurtic or exertional chest pain, orthopnea PND, or leg swelling Pt denies any increase in rescue therapy over baseline, denies waking up needing it or having any early am or nocturnal exacerbations of coughing/wheezing/or dyspnea. Pt also denies any obvious fluctuation in symptoms with  weather or environmental change or other alleviating or aggravating factors       Past Medical History  Diagnosis Date  . CAD (coronary artery disease)     20% stenosis on cath - no intervention required (W-S cards), negative nuclear stress test 11/07  . Hurthle cell adenoma of thyroid 2007  . Cancer 2007    lung (Dr. Shirline Frees and Dr. Edwyna Shell)  . RLS (restless legs syndrome)   . Peripheral vascular disease   . Iron deficiency anemia 2/08    s/p 2 unit transfusion  . Hypertension   . COPD (chronic obstructive pulmonary disease) 9/10    golds stage III, FeV1 39%  . Lung cancer   . Bronchopleural fistula      Family History  Problem Relation Age of Onset  . Cancer Mother     Larynx & Endometrial cancer  . Cancer Father     laryngeal cancer  . Hypertension Father   . Diabetes Sister   . Multiple sclerosis Sister   . Cancer Sister 65    breast      History   Social History  . Marital Status: Married    Spouse Name: N/A    Number of Children: N/A  . Years of Education: N/A   Occupational History  . Not on file.   Social History Main Topics  . Smoking status: Former Smoker -- 1.50 packs/day for 30 years    Types: Cigarettes    Quit date: 10/20/2004  . Smokeless tobacco: Never Used  . Alcohol Use: No  . Drug Use: No  . Sexual Activity: Not on file   Other Topics Concern  . Not on file   Social History Narrative  . No narrative on file     Allergies  Allergen Reactions  . Lisinopril-Hydrochlorothiazide     REACTION: N/V cough  . Nitrofurantoin Monohyd Macro   . Penicillins     REACTION: Hives  . Lipitor [Atorvastatin] Rash     Outpatient Prescriptions Prior to Visit  Medication Sig Dispense Refill  . albuterol (PROVENTIL HFA) 108 (90 BASE) MCG/ACT inhaler Inhale 2 puffs into the lungs 4 (four) times daily as needed. Shortness of breath  3 Inhaler  4  . ALPRAZolam (XANAX) 1 MG tablet Take 1 tab by mouth once daily as needed for anxiety  90 tablet  0  . amLODipine (NORVASC) 5  MG tablet Take 1 tablet (5 mg total) by mouth daily.  90 tablet  3  . atorvastatin (LIPITOR) 10 MG tablet Take 1 tablet (10 mg total) by mouth daily.  90 tablet  0  . celecoxib (CELEBREX) 200 MG capsule Take 200 mg by mouth daily as needed.      . clopidogrel (PLAVIX) 75 MG tablet Take 1 tablet (75 mg total) by mouth daily.  90 tablet  2  . desvenlafaxine (PRISTIQ) 50 MG 24 hr tablet Take 1 tablet (50 mg total) by mouth daily.  90 tablet  2  . levothyroxine (SYNTHROID, LEVOTHROID) 50 MCG tablet Take 1 tablet (50 mcg total) by mouth daily.  90 tablet  3  . multivitamin-iron-minerals-folic acid (CENTRUM) chewable tablet Chew 1 tablet by mouth daily.        . QUEtiapine (SEROQUEL) 50 MG tablet Take 3-4 tablets (150-200 mg total) by mouth at bedtime.  360 tablet  2  . Spacer/Aero-Holding Chambers (AEROCHAMBER MV) inhaler Use as instructed with  qvar and proventil       . albuterol (PROVENTIL) (2.5 MG/3ML) 0.083% nebulizer solution Take 3 mLs (2.5 mg total) by nebulization every 6 (six) hours as needed.  75 mL  4  . arformoterol (BROVANA) 15 MCG/2ML NEBU Take 2 mLs (15 mcg total) by nebulization 2 (two) times daily. 2 ml neb BID  180 mL  4  . beclomethasone (QVAR) 40 MCG/ACT inhaler Inhale 2 puffs into the lungs 2 (two) times daily.  3 Inhaler  4  . famotidine (PEPCID) 20 MG tablet Take 2 tablets (40 mg total) by mouth at bedtime.  90 tablet  2  . tiotropium (SPIRIVA) 18 MCG inhalation capsule Place 1 capsule (18 mcg total) into inhaler and inhale daily.  90 capsule  4   No facility-administered medications prior to visit.       Objective:    Filed Vitals:   06/09/13 1509  BP: 156/80  Pulse: 69  Height: 5\' 4"  (1.626 m)  Weight: 147 lb (66.679 kg)  SpO2: 97%    Gen: Pleasant, well-nourished, in no distress,  normal affect  ENT: No lesions,  mouth clear,  oropharynx clear, no postnasal drip  Neck: No JVD, no TMG, no carotid bruits  Lungs: No use of accessory muscles, no dullness to percussion, distant bs   Cardiovascular: RRR, heart sounds normal, no murmur or gallops, no peripheral edema  Abdomen: soft and NT, no HSM,  BS normal  Musculoskeletal: No deformities, no cyanosis or clubbing  Neuro: alert, non focal  Skin: Warm, no lesions or rashes  Recent CT scan of the chest reviewed and showed no recurrence in chest     Assessment & Plan:   COPD gold stage C. Gold stage C. COPD status post right upper lobe resection for lung cancer without evidence of recurrence on current CT scan in August 2014 Plan Maintain inhaled medications as prescribed  LUNG CANCER No evidence of recurrence on CT scan of the chest August 2014 with prior history of non-small cell carcinoma lung status post resection 2005 with radiation seed    Updated Medication List Outpatient Encounter Prescriptions as of 06/09/2013  Medication  Sig Dispense Refill  . albuterol (PROVENTIL HFA) 108 (90 BASE) MCG/ACT inhaler Inhale 2 puffs into the lungs 4 (four) times daily as needed. Shortness of breath  3 Inhaler  4  . albuterol (PROVENTIL) (2.5 MG/3ML) 0.083% nebulizer solution Take 3 mLs (2.5 mg total) by nebulization every 6 (six) hours as  needed.  75 mL  4  . ALPRAZolam (XANAX) 1 MG tablet Take 1 tab by mouth once daily as needed for anxiety  90 tablet  0  . amLODipine (NORVASC) 5 MG tablet Take 1 tablet (5 mg total) by mouth daily.  90 tablet  3  . arformoterol (BROVANA) 15 MCG/2ML NEBU Take 2 mLs (15 mcg total) by nebulization 2 (two) times daily. 2 ml neb BID  180 mL  4  . atorvastatin (LIPITOR) 10 MG tablet Take 1 tablet (10 mg total) by mouth daily.  90 tablet  0  . beclomethasone (QVAR) 40 MCG/ACT inhaler Inhale 2 puffs into the lungs 2 (two) times daily.  3 Inhaler  4  . Biotin 1000 MCG tablet Take 1,000 mcg by mouth daily.      . celecoxib (CELEBREX) 200 MG capsule Take 200 mg by mouth daily as needed.      . clopidogrel (PLAVIX) 75 MG tablet Take 1 tablet (75 mg total) by mouth daily.  90 tablet  2  . desvenlafaxine (PRISTIQ) 50 MG 24 hr tablet Take 1 tablet (50 mg total) by mouth daily.  90 tablet  2  . famotidine (PEPCID) 20 MG tablet Take 2 tablets (40 mg total) by mouth at bedtime.  90 tablet  2  . levothyroxine (SYNTHROID, LEVOTHROID) 50 MCG tablet Take 1 tablet (50 mcg total) by mouth daily.  90 tablet  3  . multivitamin-iron-minerals-folic acid (CENTRUM) chewable tablet Chew 1 tablet by mouth daily.        . QUEtiapine (SEROQUEL) 50 MG tablet Take 3-4 tablets (150-200 mg total) by mouth at bedtime.  360 tablet  2  . Spacer/Aero-Holding Chambers (AEROCHAMBER MV) inhaler Use as instructed with qvar and proventil       . tiotropium (SPIRIVA) 18 MCG inhalation capsule Place 1 capsule (18 mcg total) into inhaler and inhale daily.  90 capsule  4  . [DISCONTINUED] albuterol (PROVENTIL) (2.5 MG/3ML) 0.083% nebulizer solution  Take 3 mLs (2.5 mg total) by nebulization every 6 (six) hours as needed.  75 mL  4  . [DISCONTINUED] arformoterol (BROVANA) 15 MCG/2ML NEBU Take 2 mLs (15 mcg total) by nebulization 2 (two) times daily. 2 ml neb BID  180 mL  4  . [DISCONTINUED] beclomethasone (QVAR) 40 MCG/ACT inhaler Inhale 2 puffs into the lungs 2 (two) times daily.  3 Inhaler  4  . [DISCONTINUED] famotidine (PEPCID) 20 MG tablet Take 2 tablets (40 mg total) by mouth at bedtime.  90 tablet  2  . [DISCONTINUED] tiotropium (SPIRIVA) 18 MCG inhalation capsule Place 1 capsule (18 mcg total) into inhaler and inhale daily.  90 capsule  4   No facility-administered encounter medications on file as of 06/09/2013.

## 2013-06-09 NOTE — Assessment & Plan Note (Signed)
No evidence of recurrence on CT scan of the chest August 2014 with prior history of non-small cell carcinoma lung status post resection 2005 with radiation seed

## 2013-06-09 NOTE — Assessment & Plan Note (Signed)
Gold stage C. COPD status post right upper lobe resection for lung cancer without evidence of recurrence on current CT scan in August 2014 Plan Maintain inhaled medications as prescribed

## 2013-06-09 NOTE — Patient Instructions (Addendum)
No change in medications Remember flu vaccine this fall Return 6 months

## 2013-06-13 DIAGNOSIS — S8990XA Unspecified injury of unspecified lower leg, initial encounter: Secondary | ICD-10-CM | POA: Diagnosis not present

## 2013-06-13 DIAGNOSIS — Z471 Aftercare following joint replacement surgery: Secondary | ICD-10-CM | POA: Diagnosis not present

## 2013-06-13 DIAGNOSIS — F411 Generalized anxiety disorder: Secondary | ICD-10-CM | POA: Diagnosis present

## 2013-06-13 DIAGNOSIS — E039 Hypothyroidism, unspecified: Secondary | ICD-10-CM | POA: Diagnosis present

## 2013-06-13 DIAGNOSIS — S79919A Unspecified injury of unspecified hip, initial encounter: Secondary | ICD-10-CM | POA: Diagnosis not present

## 2013-06-13 DIAGNOSIS — Z8673 Personal history of transient ischemic attack (TIA), and cerebral infarction without residual deficits: Secondary | ICD-10-CM | POA: Diagnosis not present

## 2013-06-13 DIAGNOSIS — W19XXXA Unspecified fall, initial encounter: Secondary | ICD-10-CM | POA: Diagnosis not present

## 2013-06-13 DIAGNOSIS — Z880 Allergy status to penicillin: Secondary | ICD-10-CM | POA: Diagnosis not present

## 2013-06-13 DIAGNOSIS — K219 Gastro-esophageal reflux disease without esophagitis: Secondary | ICD-10-CM | POA: Diagnosis not present

## 2013-06-13 DIAGNOSIS — Z888 Allergy status to other drugs, medicaments and biological substances status: Secondary | ICD-10-CM | POA: Diagnosis not present

## 2013-06-13 DIAGNOSIS — R110 Nausea: Secondary | ICD-10-CM | POA: Diagnosis not present

## 2013-06-13 DIAGNOSIS — S72009A Fracture of unspecified part of neck of unspecified femur, initial encounter for closed fracture: Secondary | ICD-10-CM | POA: Diagnosis not present

## 2013-06-13 DIAGNOSIS — Z96649 Presence of unspecified artificial hip joint: Secondary | ICD-10-CM | POA: Diagnosis not present

## 2013-06-13 DIAGNOSIS — S72033A Displaced midcervical fracture of unspecified femur, initial encounter for closed fracture: Secondary | ICD-10-CM | POA: Diagnosis not present

## 2013-06-13 DIAGNOSIS — I10 Essential (primary) hypertension: Secondary | ICD-10-CM | POA: Diagnosis not present

## 2013-06-13 DIAGNOSIS — E876 Hypokalemia: Secondary | ICD-10-CM | POA: Diagnosis not present

## 2013-06-13 DIAGNOSIS — Z7901 Long term (current) use of anticoagulants: Secondary | ICD-10-CM | POA: Diagnosis not present

## 2013-06-13 DIAGNOSIS — Z85118 Personal history of other malignant neoplasm of bronchus and lung: Secondary | ICD-10-CM | POA: Diagnosis not present

## 2013-06-13 DIAGNOSIS — D72829 Elevated white blood cell count, unspecified: Secondary | ICD-10-CM | POA: Diagnosis not present

## 2013-06-13 DIAGNOSIS — S99919A Unspecified injury of unspecified ankle, initial encounter: Secondary | ICD-10-CM | POA: Diagnosis not present

## 2013-06-13 DIAGNOSIS — J449 Chronic obstructive pulmonary disease, unspecified: Secondary | ICD-10-CM | POA: Diagnosis not present

## 2013-06-13 DIAGNOSIS — R918 Other nonspecific abnormal finding of lung field: Secondary | ICD-10-CM | POA: Diagnosis not present

## 2013-06-14 DIAGNOSIS — Z8673 Personal history of transient ischemic attack (TIA), and cerebral infarction without residual deficits: Secondary | ICD-10-CM | POA: Insufficient documentation

## 2013-06-18 DIAGNOSIS — K219 Gastro-esophageal reflux disease without esophagitis: Secondary | ICD-10-CM | POA: Diagnosis not present

## 2013-06-18 DIAGNOSIS — J45909 Unspecified asthma, uncomplicated: Secondary | ICD-10-CM | POA: Diagnosis not present

## 2013-06-18 DIAGNOSIS — Z471 Aftercare following joint replacement surgery: Secondary | ICD-10-CM | POA: Diagnosis not present

## 2013-06-18 DIAGNOSIS — J449 Chronic obstructive pulmonary disease, unspecified: Secondary | ICD-10-CM | POA: Diagnosis not present

## 2013-06-18 DIAGNOSIS — Z85118 Personal history of other malignant neoplasm of bronchus and lung: Secondary | ICD-10-CM | POA: Diagnosis not present

## 2013-06-18 DIAGNOSIS — I10 Essential (primary) hypertension: Secondary | ICD-10-CM | POA: Diagnosis not present

## 2013-06-18 DIAGNOSIS — Z96649 Presence of unspecified artificial hip joint: Secondary | ICD-10-CM | POA: Diagnosis not present

## 2013-06-18 DIAGNOSIS — F411 Generalized anxiety disorder: Secondary | ICD-10-CM | POA: Diagnosis not present

## 2013-06-18 DIAGNOSIS — Z8673 Personal history of transient ischemic attack (TIA), and cerebral infarction without residual deficits: Secondary | ICD-10-CM | POA: Diagnosis not present

## 2013-06-21 DIAGNOSIS — J45909 Unspecified asthma, uncomplicated: Secondary | ICD-10-CM | POA: Diagnosis not present

## 2013-06-21 DIAGNOSIS — J449 Chronic obstructive pulmonary disease, unspecified: Secondary | ICD-10-CM | POA: Diagnosis not present

## 2013-06-21 DIAGNOSIS — Z8673 Personal history of transient ischemic attack (TIA), and cerebral infarction without residual deficits: Secondary | ICD-10-CM | POA: Diagnosis not present

## 2013-06-21 DIAGNOSIS — I10 Essential (primary) hypertension: Secondary | ICD-10-CM | POA: Diagnosis not present

## 2013-06-21 DIAGNOSIS — Z471 Aftercare following joint replacement surgery: Secondary | ICD-10-CM | POA: Diagnosis not present

## 2013-06-21 DIAGNOSIS — K219 Gastro-esophageal reflux disease without esophagitis: Secondary | ICD-10-CM | POA: Diagnosis not present

## 2013-06-22 ENCOUNTER — Ambulatory Visit (INDEPENDENT_AMBULATORY_CARE_PROVIDER_SITE_OTHER): Payer: Medicare Other | Admitting: Pulmonary Disease

## 2013-06-22 ENCOUNTER — Encounter: Payer: Self-pay | Admitting: Pulmonary Disease

## 2013-06-22 ENCOUNTER — Telehealth: Payer: Self-pay | Admitting: Critical Care Medicine

## 2013-06-22 VITALS — BP 124/84 | HR 84 | Temp 99.1°F

## 2013-06-22 DIAGNOSIS — K219 Gastro-esophageal reflux disease without esophagitis: Secondary | ICD-10-CM | POA: Diagnosis not present

## 2013-06-22 DIAGNOSIS — J449 Chronic obstructive pulmonary disease, unspecified: Secondary | ICD-10-CM

## 2013-06-22 DIAGNOSIS — Z8673 Personal history of transient ischemic attack (TIA), and cerebral infarction without residual deficits: Secondary | ICD-10-CM | POA: Diagnosis not present

## 2013-06-22 DIAGNOSIS — R0902 Hypoxemia: Secondary | ICD-10-CM | POA: Diagnosis not present

## 2013-06-22 DIAGNOSIS — I10 Essential (primary) hypertension: Secondary | ICD-10-CM | POA: Diagnosis not present

## 2013-06-22 DIAGNOSIS — J45909 Unspecified asthma, uncomplicated: Secondary | ICD-10-CM | POA: Diagnosis not present

## 2013-06-22 DIAGNOSIS — Z471 Aftercare following joint replacement surgery: Secondary | ICD-10-CM | POA: Diagnosis not present

## 2013-06-22 DIAGNOSIS — J4489 Other specified chronic obstructive pulmonary disease: Secondary | ICD-10-CM | POA: Diagnosis not present

## 2013-06-22 NOTE — Progress Notes (Signed)
  Subjective:    Patient ID: [name], female    DOB: August 26, 1946, 67 y.o.   MRN: [id number]  HPI Patient comes in today for an acute sick visit.  She has known significant COPD, but has not required oxygen in the past.  She has fallen recently, and underwent partial left hip replacement the end of August.  Since being home, she has been receiving physical therapy, and has been noted to have oxygen desaturations with walking around the house.  She denies having any increased dyspnea on exertion above her usual baseline.  She denies any significant cough or mucus production above her usual baseline.  She has had some intermittent sharp chest discomfort, but has not had hemoptysis.  She did receive DVT prophylaxis before and after her hip replacement.   Review of Systems  Constitutional: Positive for fever, activity change and fatigue. Negative for unexpected weight change.  HENT: Negative for ear pain, nosebleeds, congestion, sore throat, rhinorrhea, sneezing, trouble swallowing, dental problem, postnasal drip and sinus pressure.   Eyes: Negative for redness and itching.  Respiratory: Positive for shortness of breath. Negative for cough, chest tightness and wheezing.   Cardiovascular: Negative for palpitations and leg swelling.  Gastrointestinal: Negative for nausea and vomiting.  Genitourinary: Negative for dysuria.  Musculoskeletal: Negative for joint swelling.  Skin: Negative for rash.  Neurological: Negative for headaches.  Hematological: Does not bruise/bleed easily.  Psychiatric/Behavioral: Negative for dysphoric mood. The patient is not nervous/anxious.        Objective:   Physical Exam Well-developed female in no acute distress Nose without purulence or discharge noted Oropharynx clear Neck without lymphadenopathy or thyromegaly Chest with decreased breath sounds, no active wheezing or crackles Cardiac exam with a regular rate and rhythm, 2/6 systolic murmur Lower  extremities with no significant edema, no calf tenderness, no cyanosis. Alert and oriented, moves all 4 extremities.       Assessment & Plan:

## 2013-06-22 NOTE — Telephone Encounter (Signed)
Noted Sounds like pt will need oxygen

## 2013-06-22 NOTE — Telephone Encounter (Signed)
LMOMTCB x1 for [name]

## 2013-06-22 NOTE — Addendum Note (Signed)
Addended by: Caryl Ada on: 06/22/2013 03:58 PM   Modules accepted: Orders

## 2013-06-22 NOTE — Telephone Encounter (Signed)
I spoke with Tasia Catchings. He stated yesterday when he saw pt at rest her O2 level was fluctuating 86%-89% RA. He walked pt for about 10-15 ft and it dropped to 80-83%. Yesterday evening her sats were dropping into the 70's. Pt does not have O2 set up. Pt is scheduled to come in and see KC today at 2:30 for acute visit. Will forward to Dr. Delford Field as an Lorain Childes

## 2013-06-22 NOTE — Telephone Encounter (Signed)
Order ONO on RA

## 2013-06-22 NOTE — Telephone Encounter (Signed)
I spoke with [name]. She saw pt 1st visit today. She was home from the hospital Friday d/t hip fracture. Pt sats RA are 88-90% RA. Erie Noe is wanting to know if we can do ONO. I advised her when she comes in they will address her daytime O2 needs. Please advise Dr. Delford Field is we need to order ONO RA for pt thanks

## 2013-06-22 NOTE — Telephone Encounter (Signed)
i have sent in order to [facility]. Nothing further needed

## 2013-06-22 NOTE — Telephone Encounter (Signed)
lmomtcb x1 for craig

## 2013-06-22 NOTE — Assessment & Plan Note (Signed)
The patient has been noted to have exertional desaturation at home on room air with physical therapy after hip surgery.  The patient does not feel that her dyspnea is any different than her usual baseline, and it is possible that she has had this issue for quite some time given her significant lung disease.  However, she is status post recent hip surgery, and therefore at significant risk for thromboembolic disease despite being on prophylaxis.  I think it is prudent to do a lung scan to rule out pulmonary emboli, and in the meantime will order exertional oxygen for her.  An overnight oximetry testing is also pending.

## 2013-06-22 NOTE — Telephone Encounter (Signed)
[name] returning call.

## 2013-06-22 NOTE — Assessment & Plan Note (Signed)
The patient has a history of significant COPD, but there is nothing by her history or exam to suggest that she is having an acute exacerbation currently.  I have asked her to continue on her current medications.

## 2013-06-22 NOTE — Patient Instructions (Signed)
Will arrange for a lung scan to make sure you do not have blood clots in your lungs after hip surgery. Will arrange for oxygen with exertional activities.  Overnight oxygen testing has also been ordered by Dr. Delford Field.  Will call you with results of your testing.  Keep your followup apptm with Dr. Delford Field.

## 2013-06-23 ENCOUNTER — Ambulatory Visit: Payer: Medicare Other | Admitting: Critical Care Medicine

## 2013-06-23 DIAGNOSIS — Z8673 Personal history of transient ischemic attack (TIA), and cerebral infarction without residual deficits: Secondary | ICD-10-CM | POA: Diagnosis not present

## 2013-06-23 DIAGNOSIS — J45909 Unspecified asthma, uncomplicated: Secondary | ICD-10-CM | POA: Diagnosis not present

## 2013-06-23 DIAGNOSIS — I10 Essential (primary) hypertension: Secondary | ICD-10-CM | POA: Diagnosis not present

## 2013-06-23 DIAGNOSIS — K219 Gastro-esophageal reflux disease without esophagitis: Secondary | ICD-10-CM | POA: Diagnosis not present

## 2013-06-23 DIAGNOSIS — Z471 Aftercare following joint replacement surgery: Secondary | ICD-10-CM | POA: Diagnosis not present

## 2013-06-23 DIAGNOSIS — J449 Chronic obstructive pulmonary disease, unspecified: Secondary | ICD-10-CM | POA: Diagnosis not present

## 2013-06-24 ENCOUNTER — Ambulatory Visit (HOSPITAL_COMMUNITY)
Admission: RE | Admit: 2013-06-24 | Discharge: 2013-06-24 | Disposition: A | Discharge status: Home / Self Care | Payer: Medicare Other | Source: Ambulatory Visit | Attending: Pulmonary Disease | Admitting: Pulmonary Disease

## 2013-06-24 DIAGNOSIS — R0602 Shortness of breath: Secondary | ICD-10-CM | POA: Insufficient documentation

## 2013-06-24 DIAGNOSIS — R079 Chest pain, unspecified: Secondary | ICD-10-CM | POA: Insufficient documentation

## 2013-06-24 DIAGNOSIS — I10 Essential (primary) hypertension: Secondary | ICD-10-CM | POA: Diagnosis not present

## 2013-06-24 DIAGNOSIS — K219 Gastro-esophageal reflux disease without esophagitis: Secondary | ICD-10-CM | POA: Diagnosis not present

## 2013-06-24 DIAGNOSIS — R0902 Hypoxemia: Secondary | ICD-10-CM

## 2013-06-24 DIAGNOSIS — J449 Chronic obstructive pulmonary disease, unspecified: Secondary | ICD-10-CM | POA: Diagnosis not present

## 2013-06-24 DIAGNOSIS — Z471 Aftercare following joint replacement surgery: Secondary | ICD-10-CM | POA: Diagnosis not present

## 2013-06-24 DIAGNOSIS — C3490 Malignant neoplasm of unspecified part of unspecified bronchus or lung: Secondary | ICD-10-CM | POA: Insufficient documentation

## 2013-06-24 DIAGNOSIS — J45909 Unspecified asthma, uncomplicated: Secondary | ICD-10-CM | POA: Diagnosis not present

## 2013-06-24 DIAGNOSIS — Z8673 Personal history of transient ischemic attack (TIA), and cerebral infarction without residual deficits: Secondary | ICD-10-CM | POA: Diagnosis not present

## 2013-06-24 MED ORDER — TECHNETIUM TO 99M ALBUMIN AGGREGATED
6.0000 | Freq: Once | INTRAVENOUS | Status: AC | PRN
  Administered 2013-06-24: 6 via INTRAVENOUS

## 2013-06-24 MED ORDER — TECHNETIUM TC 99M DIETHYLENETRIAME-PENTAACETIC ACID: 40.0000 | Freq: Once | INTRAVENOUS | Status: AC | PRN

## 2013-06-25 DIAGNOSIS — I10 Essential (primary) hypertension: Secondary | ICD-10-CM | POA: Diagnosis not present

## 2013-06-25 DIAGNOSIS — J449 Chronic obstructive pulmonary disease, unspecified: Secondary | ICD-10-CM | POA: Diagnosis not present

## 2013-06-25 DIAGNOSIS — Z8673 Personal history of transient ischemic attack (TIA), and cerebral infarction without residual deficits: Secondary | ICD-10-CM | POA: Diagnosis not present

## 2013-06-25 DIAGNOSIS — J45909 Unspecified asthma, uncomplicated: Secondary | ICD-10-CM | POA: Diagnosis not present

## 2013-06-25 DIAGNOSIS — K219 Gastro-esophageal reflux disease without esophagitis: Secondary | ICD-10-CM | POA: Diagnosis not present

## 2013-06-25 DIAGNOSIS — Z471 Aftercare following joint replacement surgery: Secondary | ICD-10-CM | POA: Diagnosis not present

## 2013-06-28 DIAGNOSIS — I10 Essential (primary) hypertension: Secondary | ICD-10-CM | POA: Diagnosis not present

## 2013-06-28 DIAGNOSIS — J449 Chronic obstructive pulmonary disease, unspecified: Secondary | ICD-10-CM | POA: Diagnosis not present

## 2013-06-28 DIAGNOSIS — Z471 Aftercare following joint replacement surgery: Secondary | ICD-10-CM | POA: Diagnosis not present

## 2013-06-28 DIAGNOSIS — J45909 Unspecified asthma, uncomplicated: Secondary | ICD-10-CM | POA: Diagnosis not present

## 2013-06-28 DIAGNOSIS — K219 Gastro-esophageal reflux disease without esophagitis: Secondary | ICD-10-CM | POA: Diagnosis not present

## 2013-06-28 DIAGNOSIS — Z8673 Personal history of transient ischemic attack (TIA), and cerebral infarction without residual deficits: Secondary | ICD-10-CM | POA: Diagnosis not present

## 2013-06-29 DIAGNOSIS — S72033A Displaced midcervical fracture of unspecified femur, initial encounter for closed fracture: Secondary | ICD-10-CM | POA: Diagnosis not present

## 2013-06-30 DIAGNOSIS — I10 Essential (primary) hypertension: Secondary | ICD-10-CM | POA: Diagnosis not present

## 2013-06-30 DIAGNOSIS — Z8673 Personal history of transient ischemic attack (TIA), and cerebral infarction without residual deficits: Secondary | ICD-10-CM | POA: Diagnosis not present

## 2013-06-30 DIAGNOSIS — J449 Chronic obstructive pulmonary disease, unspecified: Secondary | ICD-10-CM | POA: Diagnosis not present

## 2013-06-30 DIAGNOSIS — Z471 Aftercare following joint replacement surgery: Secondary | ICD-10-CM | POA: Diagnosis not present

## 2013-06-30 DIAGNOSIS — J45909 Unspecified asthma, uncomplicated: Secondary | ICD-10-CM | POA: Diagnosis not present

## 2013-06-30 DIAGNOSIS — K219 Gastro-esophageal reflux disease without esophagitis: Secondary | ICD-10-CM | POA: Diagnosis not present

## 2013-07-01 DIAGNOSIS — Z8673 Personal history of transient ischemic attack (TIA), and cerebral infarction without residual deficits: Secondary | ICD-10-CM | POA: Diagnosis not present

## 2013-07-01 DIAGNOSIS — K219 Gastro-esophageal reflux disease without esophagitis: Secondary | ICD-10-CM | POA: Diagnosis not present

## 2013-07-01 DIAGNOSIS — I10 Essential (primary) hypertension: Secondary | ICD-10-CM | POA: Diagnosis not present

## 2013-07-01 DIAGNOSIS — J449 Chronic obstructive pulmonary disease, unspecified: Secondary | ICD-10-CM | POA: Diagnosis not present

## 2013-07-01 DIAGNOSIS — Z471 Aftercare following joint replacement surgery: Secondary | ICD-10-CM | POA: Diagnosis not present

## 2013-07-01 DIAGNOSIS — J45909 Unspecified asthma, uncomplicated: Secondary | ICD-10-CM | POA: Diagnosis not present

## 2013-07-04 ENCOUNTER — Other Ambulatory Visit: Payer: Self-pay | Admitting: Family Medicine

## 2013-07-04 DIAGNOSIS — I10 Essential (primary) hypertension: Secondary | ICD-10-CM | POA: Diagnosis not present

## 2013-07-04 DIAGNOSIS — J449 Chronic obstructive pulmonary disease, unspecified: Secondary | ICD-10-CM | POA: Diagnosis not present

## 2013-07-04 DIAGNOSIS — Z8673 Personal history of transient ischemic attack (TIA), and cerebral infarction without residual deficits: Secondary | ICD-10-CM | POA: Diagnosis not present

## 2013-07-04 DIAGNOSIS — Z471 Aftercare following joint replacement surgery: Secondary | ICD-10-CM | POA: Diagnosis not present

## 2013-07-04 DIAGNOSIS — J45909 Unspecified asthma, uncomplicated: Secondary | ICD-10-CM | POA: Diagnosis not present

## 2013-07-04 DIAGNOSIS — K219 Gastro-esophageal reflux disease without esophagitis: Secondary | ICD-10-CM | POA: Diagnosis not present

## 2013-07-05 DIAGNOSIS — J449 Chronic obstructive pulmonary disease, unspecified: Secondary | ICD-10-CM | POA: Diagnosis not present

## 2013-07-05 DIAGNOSIS — I10 Essential (primary) hypertension: Secondary | ICD-10-CM | POA: Diagnosis not present

## 2013-07-05 DIAGNOSIS — J45909 Unspecified asthma, uncomplicated: Secondary | ICD-10-CM | POA: Diagnosis not present

## 2013-07-05 DIAGNOSIS — K219 Gastro-esophageal reflux disease without esophagitis: Secondary | ICD-10-CM | POA: Diagnosis not present

## 2013-07-05 DIAGNOSIS — Z471 Aftercare following joint replacement surgery: Secondary | ICD-10-CM | POA: Diagnosis not present

## 2013-07-05 DIAGNOSIS — Z8673 Personal history of transient ischemic attack (TIA), and cerebral infarction without residual deficits: Secondary | ICD-10-CM | POA: Diagnosis not present

## 2013-07-06 DIAGNOSIS — J45909 Unspecified asthma, uncomplicated: Secondary | ICD-10-CM | POA: Diagnosis not present

## 2013-07-06 DIAGNOSIS — J449 Chronic obstructive pulmonary disease, unspecified: Secondary | ICD-10-CM | POA: Diagnosis not present

## 2013-07-06 DIAGNOSIS — K219 Gastro-esophageal reflux disease without esophagitis: Secondary | ICD-10-CM | POA: Diagnosis not present

## 2013-07-06 DIAGNOSIS — Z471 Aftercare following joint replacement surgery: Secondary | ICD-10-CM | POA: Diagnosis not present

## 2013-07-06 DIAGNOSIS — I10 Essential (primary) hypertension: Secondary | ICD-10-CM | POA: Diagnosis not present

## 2013-07-06 DIAGNOSIS — Z8673 Personal history of transient ischemic attack (TIA), and cerebral infarction without residual deficits: Secondary | ICD-10-CM | POA: Diagnosis not present

## 2013-07-07 DIAGNOSIS — K219 Gastro-esophageal reflux disease without esophagitis: Secondary | ICD-10-CM | POA: Diagnosis not present

## 2013-07-07 DIAGNOSIS — Z8673 Personal history of transient ischemic attack (TIA), and cerebral infarction without residual deficits: Secondary | ICD-10-CM | POA: Diagnosis not present

## 2013-07-07 DIAGNOSIS — I10 Essential (primary) hypertension: Secondary | ICD-10-CM | POA: Diagnosis not present

## 2013-07-07 DIAGNOSIS — J449 Chronic obstructive pulmonary disease, unspecified: Secondary | ICD-10-CM | POA: Diagnosis not present

## 2013-07-07 DIAGNOSIS — J45909 Unspecified asthma, uncomplicated: Secondary | ICD-10-CM | POA: Diagnosis not present

## 2013-07-07 DIAGNOSIS — Z471 Aftercare following joint replacement surgery: Secondary | ICD-10-CM | POA: Diagnosis not present

## 2013-07-08 DIAGNOSIS — K219 Gastro-esophageal reflux disease without esophagitis: Secondary | ICD-10-CM | POA: Diagnosis not present

## 2013-07-08 DIAGNOSIS — J449 Chronic obstructive pulmonary disease, unspecified: Secondary | ICD-10-CM | POA: Diagnosis not present

## 2013-07-08 DIAGNOSIS — Z8673 Personal history of transient ischemic attack (TIA), and cerebral infarction without residual deficits: Secondary | ICD-10-CM | POA: Diagnosis not present

## 2013-07-08 DIAGNOSIS — I10 Essential (primary) hypertension: Secondary | ICD-10-CM | POA: Diagnosis not present

## 2013-07-08 DIAGNOSIS — Z471 Aftercare following joint replacement surgery: Secondary | ICD-10-CM | POA: Diagnosis not present

## 2013-07-08 DIAGNOSIS — J45909 Unspecified asthma, uncomplicated: Secondary | ICD-10-CM | POA: Diagnosis not present

## 2013-07-20 ENCOUNTER — Ambulatory Visit (INDEPENDENT_AMBULATORY_CARE_PROVIDER_SITE_OTHER): Payer: Medicare Other | Admitting: Physician Assistant

## 2013-07-20 VITALS — BP 144/76 | HR 99 | Temp 98.7°F

## 2013-07-20 DIAGNOSIS — Z23 Encounter for immunization: Secondary | ICD-10-CM | POA: Diagnosis not present

## 2013-07-20 NOTE — Progress Notes (Signed)
Pt tolerated injection well.Ariadne Rissmiller Lynetta  

## 2013-08-16 LAB — HM MAMMOGRAPHY

## 2013-09-02 ENCOUNTER — Ambulatory Visit (INDEPENDENT_AMBULATORY_CARE_PROVIDER_SITE_OTHER): Payer: Medicare Other | Admitting: Physician Assistant

## 2013-09-02 ENCOUNTER — Encounter: Payer: Self-pay | Admitting: Physician Assistant

## 2013-09-02 VITALS — BP 158/82 | HR 75 | Wt 140.0 lb

## 2013-09-02 DIAGNOSIS — F32A Depression, unspecified: Secondary | ICD-10-CM

## 2013-09-02 DIAGNOSIS — J449 Chronic obstructive pulmonary disease, unspecified: Secondary | ICD-10-CM

## 2013-09-02 DIAGNOSIS — F341 Dysthymic disorder: Secondary | ICD-10-CM

## 2013-09-02 DIAGNOSIS — J4489 Other specified chronic obstructive pulmonary disease: Secondary | ICD-10-CM

## 2013-09-02 DIAGNOSIS — E039 Hypothyroidism, unspecified: Secondary | ICD-10-CM | POA: Diagnosis not present

## 2013-09-02 DIAGNOSIS — R011 Cardiac murmur, unspecified: Secondary | ICD-10-CM | POA: Diagnosis not present

## 2013-09-02 DIAGNOSIS — Z8781 Personal history of (healed) traumatic fracture: Secondary | ICD-10-CM | POA: Insufficient documentation

## 2013-09-02 DIAGNOSIS — K219 Gastro-esophageal reflux disease without esophagitis: Secondary | ICD-10-CM

## 2013-09-02 DIAGNOSIS — I10 Essential (primary) hypertension: Secondary | ICD-10-CM | POA: Diagnosis not present

## 2013-09-02 DIAGNOSIS — F329 Major depressive disorder, single episode, unspecified: Secondary | ICD-10-CM

## 2013-09-02 DIAGNOSIS — E7800 Pure hypercholesterolemia, unspecified: Secondary | ICD-10-CM | POA: Diagnosis not present

## 2013-09-02 DIAGNOSIS — G4700 Insomnia, unspecified: Secondary | ICD-10-CM

## 2013-09-02 DIAGNOSIS — E890 Postprocedural hypothyroidism: Secondary | ICD-10-CM

## 2013-09-02 DIAGNOSIS — Z131 Encounter for screening for diabetes mellitus: Secondary | ICD-10-CM

## 2013-09-02 HISTORY — DX: Personal history of (healed) traumatic fracture: Z87.81

## 2013-09-02 MED ORDER — LEVOTHYROXINE SODIUM 50 MCG PO TABS: 50.0000 ug | ORAL_TABLET | Freq: Every day | ORAL | Status: DC

## 2013-09-02 MED ORDER — CLOPIDOGREL BISULFATE 75 MG PO TABS: 75.0000 mg | ORAL_TABLET | Freq: Every day | ORAL | Status: DC

## 2013-09-02 MED ORDER — ALPRAZOLAM 1 MG PO TABS: ORAL_TABLET | ORAL | Status: DC

## 2013-09-02 MED ORDER — DESVENLAFAXINE SUCCINATE ER 50 MG PO TB24: 50.0000 mg | ORAL_TABLET | Freq: Every day | ORAL | Status: DC

## 2013-09-02 MED ORDER — AMLODIPINE BESYLATE 5 MG PO TABS: 5.0000 mg | ORAL_TABLET | Freq: Every day | ORAL | Status: DC

## 2013-09-02 MED ORDER — METOPROLOL TARTRATE 25 MG PO TABS: 25.0000 mg | ORAL_TABLET | Freq: Two times a day (BID) | ORAL | Status: DC

## 2013-09-02 MED ORDER — ATORVASTATIN CALCIUM 10 MG PO TABS: ORAL_TABLET | ORAL | Status: DC

## 2013-09-02 NOTE — Progress Notes (Signed)
  Subjective:    Patient ID: [name], female    DOB: 08/04/1946, 67 y.o.   MRN: [id number]  HPI Pt is a 67 yo female who presents to the clinic for medication refill.   Pt reports she did fall in the summer down the stairs and had a left hip fracture. She had surgery and she has done rehab. She denies any pain today.   COPD- controlled. Pt feels good on current medications. Able to do all activities that she stes out to do. Rarely uses albuterol inhalers or nebulizers.   HTN- currently only on norvasc. Denies any CP, HA's, palpitations or vision changes. She has felt more weak over the past couple of months. No other concerns.   Depression/Anxiety- Well controlled on Pristiq.   Hypothyroidism- no heat or cold intolerance. More fatigued unaware if related to TSH.   Insomnia- controlled on seroquel.  GERD-well controlled on medication.     Review of Systems     Objective:   Physical Exam  Constitutional: She is oriented to person, place, and time. She appears well-developed and well-nourished.  HENT:  Head: Normocephalic and atraumatic.  Neck: Normal range of motion. Neck supple. No JVD present.  Cardiovascular: Normal rate and regular rhythm.   Murmur heard. Murmur was heard on exam today. Best when sitting up over the left 2nd intercostal space/pulmonic area. 3/6 present at the end of systole and beginning of diastoles.   Pulmonary/Chest: Effort normal and breath sounds normal. She has no wheezes.  Neurological: She is alert and oriented to person, place, and time.  Skin: Skin is warm and dry.  Psychiatric: She has a normal mood and affect. Her behavior is normal.          Assessment & Plan:    Heart murmur/HTn- will get echo to fully evaluate. Concerned there might be a valvular issue. My concern is that if there is a valvular issue may be increasing BP. Will start metroprolol 25mg  BID. This could help lower BP as well as help blood flow to heart. Follow up in 4  weeks to recheck BP.    Depression/anxiety- refilled prestiq.  GERD-refilled pepcid.   COPD-refilled Brovanna/spiriva.  Insomnia- refilled seroquel.   Hypothyroidism- will recheck labs today and then refill medication.  hypercholesteremia will get fasting labs today.

## 2013-09-02 NOTE — Patient Instructions (Signed)
Will schedule echo. Follow up 2 weeks.

## 2013-09-04 DIAGNOSIS — K219 Gastro-esophageal reflux disease without esophagitis: Secondary | ICD-10-CM | POA: Insufficient documentation

## 2013-09-04 DIAGNOSIS — G4700 Insomnia, unspecified: Secondary | ICD-10-CM | POA: Insufficient documentation

## 2013-09-04 HISTORY — DX: Insomnia, unspecified: G47.00

## 2013-09-12 DIAGNOSIS — E7800 Pure hypercholesterolemia, unspecified: Secondary | ICD-10-CM | POA: Diagnosis not present

## 2013-09-12 DIAGNOSIS — Z131 Encounter for screening for diabetes mellitus: Secondary | ICD-10-CM | POA: Diagnosis not present

## 2013-09-12 DIAGNOSIS — E039 Hypothyroidism, unspecified: Secondary | ICD-10-CM | POA: Diagnosis not present

## 2013-09-12 LAB — COMPLETE METABOLIC PANEL WITH GFR
ALT: 9 U/L (ref 0–35)
AST: 16 U/L (ref 0–37)
Albumin: 4 g/dL (ref 3.5–5.2)
Alkaline Phosphatase: 89 U/L (ref 39–117)
BUN: 13 mg/dL (ref 6–23)
CO2: 32 mEq/L (ref 19–32)
Calcium: 9.4 mg/dL (ref 8.4–10.5)
Chloride: 101 mEq/L (ref 96–112)
Creat: 0.62 mg/dL (ref 0.50–1.10)
GFR, Est African American: >89 mL/min
GFR, Est Non African American: >89 mL/min
Glucose, Bld: 93 mg/dL (ref 70–99)
Potassium: 4 mEq/L (ref 3.5–5.3)
Sodium: 139 mEq/L (ref 135–145)
Total Bilirubin: 0.4 mg/dL (ref 0.3–1.2)
Total Protein: 6.5 g/dL (ref 6.0–8.3)

## 2013-09-12 LAB — LIPID PANEL
Cholesterol: 153 mg/dL (ref 0–200)
HDL: 73 mg/dL (ref >39)
LDL Cholesterol: 64 mg/dL (ref 0–99)
Total CHOL/HDL Ratio: 2.1 Ratio
Triglycerides: 79 mg/dL (ref <150)
VLDL: 16 mg/dL (ref 0–40)

## 2013-09-12 LAB — TSH: TSH: 2.72 u[IU]/mL (ref 0.350–4.500)

## 2013-09-12 LAB — T4, FREE: Free T4: 1.1 ng/dL (ref 0.80–1.80)

## 2013-09-14 ENCOUNTER — Telehealth: Payer: Self-pay

## 2013-09-14 NOTE — Telephone Encounter (Signed)
[name] is awaiting a call back for the Echocardiogram.

## 2013-09-19 ENCOUNTER — Encounter: Payer: Self-pay | Admitting: Physician Assistant

## 2013-09-19 ENCOUNTER — Ambulatory Visit (INDEPENDENT_AMBULATORY_CARE_PROVIDER_SITE_OTHER): Payer: Medicare Other | Admitting: Physician Assistant

## 2013-09-19 VITALS — BP 132/60 | HR 64 | Wt 141.0 lb

## 2013-09-19 DIAGNOSIS — I10 Essential (primary) hypertension: Secondary | ICD-10-CM | POA: Diagnosis not present

## 2013-09-19 DIAGNOSIS — R011 Cardiac murmur, unspecified: Secondary | ICD-10-CM

## 2013-09-19 MED ORDER — METOPROLOL TARTRATE 25 MG PO TABS: 25.0000 mg | ORAL_TABLET | Freq: Two times a day (BID) | ORAL | Status: DC

## 2013-09-19 NOTE — Progress Notes (Signed)
   Subjective:    Patient ID: [name], female    DOB: 12-10-1945, 67 y.o.   MRN: [id number]  HPI Patient is a 67 year old female who presents to the clinic to followup on hypertension. She denies any chest pains, fatigue, shortness of breath, headache or vision changes. At last visit and heart murmur was heard. An echocardiogram was ordered; however, she has still not had done. She has been taking her metoprolol 25 mg twice a day. She denies any side effects from medication. Patient reports to be feeling great today.   Review of Systems     Objective:   Physical Exam  Constitutional: She is oriented to person, place, and time. She appears well-developed and well-nourished.  HENT:  Head: Normocephalic and atraumatic.  Cardiovascular: Regular rhythm.   3/6 systolic ejection murmur best heard at 2nd left intercostal space/pulmonic region.   Pulmonary/Chest: Effort normal and breath sounds normal. She has no wheezes.  Neurological: She is alert and oriented to person, place, and time.  Skin: Skin is warm and dry.  Psychiatric: She has a normal mood and affect. Her behavior is normal.          Assessment & Plan:  HTN/heart murmur- blood pressure looks much better today. Continue on metoprolol. We'll make sure patient is call this week with echocardiogram. We'll call with the results. Followup in 6 months or sooner if any problems.

## 2013-09-21 ENCOUNTER — Telehealth: Payer: Self-pay | Admitting: Critical Care Medicine

## 2013-09-21 DIAGNOSIS — J449 Chronic obstructive pulmonary disease, unspecified: Secondary | ICD-10-CM

## 2013-09-21 NOTE — Telephone Encounter (Signed)
I called # listed and is getting busy signal wcb

## 2013-09-22 NOTE — Telephone Encounter (Signed)
Ok to stop during day pls order an ONO on RA to see if still desaturating

## 2013-09-22 NOTE — Telephone Encounter (Signed)
I spoke with the pt she is asking for an order be sent to [facility] to discontinue her oxygen. She states she used it for x 1 week and has not used it since. She states she has been doing physical therapy without it and they monitor her saturations and they have always been fine. Please advise. Carron Curie, CMA

## 2013-09-22 NOTE — Telephone Encounter (Signed)
Pt is aware and order placed. Staff message sent to melissa.  

## 2013-09-28 ENCOUNTER — Other Ambulatory Visit (HOSPITAL_COMMUNITY): Payer: Self-pay | Admitting: Physician Assistant

## 2013-09-28 ENCOUNTER — Ambulatory Visit (HOSPITAL_BASED_OUTPATIENT_CLINIC_OR_DEPARTMENT_OTHER)
Admission: RE | Admit: 2013-09-28 | Discharge: 2013-09-28 | Disposition: A | Discharge status: Home / Self Care | Payer: Medicare Other | Source: Ambulatory Visit | Attending: Physician Assistant | Admitting: Physician Assistant

## 2013-09-28 DIAGNOSIS — R011 Cardiac murmur, unspecified: Secondary | ICD-10-CM | POA: Diagnosis not present

## 2013-09-28 DIAGNOSIS — I10 Essential (primary) hypertension: Secondary | ICD-10-CM

## 2013-09-28 NOTE — Progress Notes (Signed)
Echocardiogram 2D Echocardiogram has been performed.  Dorothey Baseman 09/28/2013, 11:18 AM

## 2013-09-29 ENCOUNTER — Telehealth: Payer: Self-pay | Admitting: Critical Care Medicine

## 2013-09-29 NOTE — Telephone Encounter (Signed)
Pt is aware that ONO has not reviewed yet. She agreed and verbalized understanding. Will await Crystal's call with results.

## 2013-09-30 ENCOUNTER — Telehealth: Payer: Self-pay | Admitting: Critical Care Medicine

## 2013-09-30 DIAGNOSIS — J449 Chronic obstructive pulmonary disease, unspecified: Secondary | ICD-10-CM

## 2013-09-30 NOTE — Telephone Encounter (Signed)
Call pt and tell her ONO is NORMAL All oxygen can be discontinued from the home

## 2013-09-30 NOTE — Telephone Encounter (Signed)
Called pt's home # - rang several times with no answer and no option to leave msg. I have lmomtcb on pt's cell #.   Sent msg via MyChart informing pt ONO is normal. I have flagged myself on Mychart msg to advise if msg not read by Dec 17. Will keep phone msg open as I did not include o2 can be d/c'd from home.  Would like to speak with pt regarding this prior to placing order.

## 2013-10-04 NOTE — Telephone Encounter (Signed)
Called, spoke with pt.  Informed her of ONO results and recs per Dr. Delford Field.  She verbalized understanding of this and is aware I have placed order to have [facility] pick up o2.  She voiced no further questions or concerns at this time.

## 2013-11-02 ENCOUNTER — Ambulatory Visit (INDEPENDENT_AMBULATORY_CARE_PROVIDER_SITE_OTHER): Payer: Medicare Other | Admitting: Physician Assistant

## 2013-11-02 ENCOUNTER — Encounter: Payer: Self-pay | Admitting: Physician Assistant

## 2013-11-02 VITALS — BP 149/59 | HR 71 | Temp 100.2°F | Wt 144.0 lb

## 2013-11-02 DIAGNOSIS — J209 Acute bronchitis, unspecified: Secondary | ICD-10-CM

## 2013-11-02 DIAGNOSIS — R52 Pain, unspecified: Secondary | ICD-10-CM | POA: Diagnosis not present

## 2013-11-02 DIAGNOSIS — R509 Fever, unspecified: Secondary | ICD-10-CM

## 2013-11-02 DIAGNOSIS — R51 Headache: Secondary | ICD-10-CM | POA: Diagnosis not present

## 2013-11-02 DIAGNOSIS — R059 Cough, unspecified: Secondary | ICD-10-CM

## 2013-11-02 DIAGNOSIS — J440 Chronic obstructive pulmonary disease with acute lower respiratory infection: Secondary | ICD-10-CM | POA: Diagnosis not present

## 2013-11-02 DIAGNOSIS — R05 Cough: Secondary | ICD-10-CM

## 2013-11-02 DIAGNOSIS — R062 Wheezing: Secondary | ICD-10-CM

## 2013-11-02 DIAGNOSIS — J441 Chronic obstructive pulmonary disease with (acute) exacerbation: Secondary | ICD-10-CM

## 2013-11-02 DIAGNOSIS — H9193 Unspecified hearing loss, bilateral: Secondary | ICD-10-CM

## 2013-11-02 DIAGNOSIS — H9190 Unspecified hearing loss, unspecified ear: Secondary | ICD-10-CM

## 2013-11-02 DIAGNOSIS — R413 Other amnesia: Secondary | ICD-10-CM

## 2013-11-02 LAB — POCT INFLUENZA A/B
Influenza A, POC: NEGATIVE
Influenza B, POC: NEGATIVE

## 2013-11-02 MED ORDER — DOXYCYCLINE HYCLATE 100 MG PO CAPS: 100.0000 mg | ORAL_CAPSULE | Freq: Two times a day (BID) | ORAL | Status: DC

## 2013-11-02 MED ORDER — PREDNISONE 50 MG PO TABS: ORAL_TABLET | ORAL | Status: DC

## 2013-11-02 NOTE — Patient Instructions (Addendum)
Will refer to audiologist. Can make appt for mini-mental status.    Acute Bronchitis Bronchitis is inflammation of the airways that extend from the windpipe into the lungs (bronchi). The inflammation often causes mucus to develop. This leads to a cough, which is the most common symptom of bronchitis.  In acute bronchitis, the condition usually develops suddenly and goes away over time, usually in a couple weeks. Smoking, allergies, and asthma can make bronchitis worse. Repeated episodes of bronchitis may cause further lung problems.  CAUSES Acute bronchitis is most often caused by the same virus that causes a cold. The virus can spread from person to person (contagious).  SIGNS AND SYMPTOMS   Cough.   Fever.   Coughing up mucus.   Body aches.   Chest congestion.   Chills.   Shortness of breath.   Sore throat.  DIAGNOSIS  Acute bronchitis is usually diagnosed through a physical exam. Tests, such as chest X-rays, are sometimes done to rule out other conditions.  TREATMENT  Acute bronchitis usually goes away in a couple weeks. Often times, no medical treatment is necessary. Medicines are sometimes given for relief of fever or cough. Antibiotics are usually not needed but may be prescribed in certain situations. In some cases, an inhaler may be recommended to help reduce shortness of breath and control the cough. A cool mist vaporizer may also be used to help thin bronchial secretions and make it easier to clear the chest.  HOME CARE INSTRUCTIONS  Get plenty of rest.   Drink enough fluids to keep your urine clear or pale yellow (unless you have a medical condition that requires fluid restriction). Increasing fluids may help thin your secretions and will prevent dehydration.   Only take over-the-counter or prescription medicines as directed by your health care provider.   Avoid smoking and secondhand smoke. Exposure to cigarette smoke or irritating chemicals will make  bronchitis worse. If you are a smoker, consider using nicotine gum or skin patches to help control withdrawal symptoms. Quitting smoking will help your lungs heal faster.   Reduce the chances of another bout of acute bronchitis by washing your hands frequently, avoiding people with cold symptoms, and trying not to touch your hands to your mouth, nose, or eyes.   Follow up with your health care provider as directed.  SEEK MEDICAL CARE IF: Your symptoms do not improve after 1 week of treatment.  SEEK IMMEDIATE MEDICAL CARE IF:  You develop an increased fever or chills.   You have chest pain.   You have severe shortness of breath.  You have bloody sputum.   You develop dehydration.  You develop fainting.  You develop repeated vomiting.  You develop a severe headache. MAKE SURE YOU:   Understand these instructions.  Will watch your condition.  Will get help right away if you are not doing well or get worse. Document Released: 11/13/2004 Document Revised: 06/08/2013 Document Reviewed: 03/29/2013 [facility] Patient Information 2014 [employer].

## 2013-11-02 NOTE — Progress Notes (Signed)
   Subjective:    Patient ID: [name], female    DOB: 1946/05/08, 69 y.o.   MRN: [id number]  HPI Patient is a 68 year old female who has a history of COPD presents to the clinic with wheezing, cough, headache, bodyaches and fever for the past 24 hours. Symptoms started abruptly yesterday. They have continued to get worse. Her cough is beginning to get more and more productive. It is currently white to clear sputum. She is currently wheezing and having to use her albuterol inhaler 4-5 times a day. She reports a fever of 101-182 home. She has a temperature of 100.2. In office today. She has tried ibuprofen and Tylenol for fever. She has not tried anything else to help with symptoms. She denies any flu exposure. Denies any nausea, vomiting, diarrhea.  Patient briefly reports concerns of hearing and memory loss. She states that when someone tells her something she can remember it but states it wrong the next day or so. This happened normal table occasions.  Review of Systems     Objective:   Physical Exam  Constitutional: She is oriented to person, place, and time. She appears well-developed and well-nourished.  HENT:  Head: Normocephalic and atraumatic.  Right Ear: External ear normal.  Left Ear: External ear normal.  Nose: Nose normal.  Mouth/Throat: Oropharynx is clear and moist. No oropharyngeal exudate.  Eyes: Conjunctivae are normal. Right eye exhibits no discharge. Left eye exhibits no discharge.  Neck: Normal range of motion. Neck supple.  Cardiovascular: Normal rate, regular rhythm and normal heart sounds.   Pulmonary/Chest: Effort normal and breath sounds normal.  Expiratory wheezing heard at base of both lungs. No rhonchi or rales.  Lymphadenopathy:    She has no cervical adenopathy.  Neurological: She is alert and oriented to person, place, and time.  Skin: Skin is warm and dry.  Psychiatric: She has a normal mood and affect. Her behavior is normal.           Assessment & Plan:  Acute bronchitis/COPD exacerbation/flulike symptoms-influenza A and B. were negative. Discussed with patient she still could have some viral agent that caused an exacerbation of COPD. due to pt's age and hx treated today with doxycycline for 10 days and a prednisone pack. Continue to use daily COPD inhalers as well as albuterol as needed. Call if not improving or if worsening symptoms.  Difficulty hearing-will schedule audiologist appointment.  Concerned with memory-schedule another appointment for Mini-Mental status exam here in office. Discuss with patient that if hearing is affected there is a good chance that it might not be a brain/dementia issue but a hearing issue.

## 2013-11-04 ENCOUNTER — Telehealth: Payer: Self-pay

## 2013-11-04 NOTE — Telephone Encounter (Signed)
[name] reports her daughter and granddaughter have been diagnosed with the flu. She would like to be treated for the flu. She was seen on the 41 th by [facility].

## 2013-11-06 NOTE — Telephone Encounter (Signed)
She tested negative and her may be different and she is out of the 48 hour window for tx.

## 2013-11-17 DIAGNOSIS — H903 Sensorineural hearing loss, bilateral: Secondary | ICD-10-CM | POA: Diagnosis not present

## 2013-11-18 ENCOUNTER — Encounter: Payer: Self-pay | Admitting: Physician Assistant

## 2013-11-18 DIAGNOSIS — H903 Sensorineural hearing loss, bilateral: Secondary | ICD-10-CM | POA: Insufficient documentation

## 2013-11-21 ENCOUNTER — Ambulatory Visit (INDEPENDENT_AMBULATORY_CARE_PROVIDER_SITE_OTHER): Payer: Medicare Other | Admitting: Physician Assistant

## 2013-11-21 ENCOUNTER — Encounter: Payer: Self-pay | Admitting: Physician Assistant

## 2013-11-21 VITALS — BP 154/71 | HR 81 | Wt 143.0 lb

## 2013-11-21 DIAGNOSIS — J441 Chronic obstructive pulmonary disease with (acute) exacerbation: Secondary | ICD-10-CM | POA: Diagnosis not present

## 2013-11-21 DIAGNOSIS — J209 Acute bronchitis, unspecified: Secondary | ICD-10-CM | POA: Diagnosis not present

## 2013-11-21 MED ORDER — DOXYCYCLINE HYCLATE 100 MG PO TABS: 100.0000 mg | ORAL_TABLET | Freq: Two times a day (BID) | ORAL | Status: DC

## 2013-11-21 MED ORDER — BENZONATATE 100 MG PO CAPS: 100.0000 mg | ORAL_CAPSULE | Freq: Two times a day (BID) | ORAL | Status: DC | PRN

## 2013-11-21 MED ORDER — PREDNISONE 50 MG PO TABS: ORAL_TABLET | ORAL | Status: DC

## 2013-11-21 NOTE — Patient Instructions (Signed)
Acute Bronchitis Bronchitis is inflammation of the airways that extend from the windpipe into the lungs (bronchi). The inflammation often causes mucus to develop. This leads to a cough, which is the most common symptom of bronchitis.  In acute bronchitis, the condition usually develops suddenly and goes away over time, usually in a couple weeks. Smoking, allergies, and asthma can make bronchitis worse. Repeated episodes of bronchitis may cause further lung problems.  CAUSES Acute bronchitis is most often caused by the same virus that causes a cold. The virus can spread from person to person (contagious).  SIGNS AND SYMPTOMS   Cough.   Fever.   Coughing up mucus.   Body aches.   Chest congestion.   Chills.   Shortness of breath.   Sore throat.  DIAGNOSIS  Acute bronchitis is usually diagnosed through a physical exam. Tests, such as chest X-rays, are sometimes done to rule out other conditions.  TREATMENT  Acute bronchitis usually goes away in a couple weeks. Often times, no medical treatment is necessary. Medicines are sometimes given for relief of fever or cough. Antibiotics are usually not needed but may be prescribed in certain situations. In some cases, an inhaler may be recommended to help reduce shortness of breath and control the cough. A cool mist vaporizer may also be used to help thin bronchial secretions and make it easier to clear the chest.  HOME CARE INSTRUCTIONS  Get plenty of rest.   Drink enough fluids to keep your urine clear or pale yellow (unless you have a medical condition that requires fluid restriction). Increasing fluids may help thin your secretions and will prevent dehydration.   Only take over-the-counter or prescription medicines as directed by your health care provider.   Avoid smoking and secondhand smoke. Exposure to cigarette smoke or irritating chemicals will make bronchitis worse. If you are a smoker, consider using nicotine gum or skin  patches to help control withdrawal symptoms. Quitting smoking will help your lungs heal faster.   Reduce the chances of another bout of acute bronchitis by washing your hands frequently, avoiding people with cold symptoms, and trying not to touch your hands to your mouth, nose, or eyes.   Follow up with your health care provider as directed.  SEEK MEDICAL CARE IF: Your symptoms do not improve after 1 week of treatment.  SEEK IMMEDIATE MEDICAL CARE IF:  You develop an increased fever or chills.   You have chest pain.   You have severe shortness of breath.  You have bloody sputum.   You develop dehydration.  You develop fainting.  You develop repeated vomiting.  You develop a severe headache. MAKE SURE YOU:   Understand these instructions.  Will watch your condition.  Will get help right away if you are not doing well or get worse. Document Released: 11/13/2004 Document Revised: 06/08/2013 Document Reviewed: 03/29/2013 [employer] Patient Information 2014 [employer].  

## 2013-11-21 NOTE — Progress Notes (Signed)
   Subjective:    Patient ID: [name], female    DOB: 06/16/1946, 68 y.o.   MRN: [id number]  HPI Pt presents to the clinic today with worsening SOB and cough. Pt has hx of lung cancer, COPD and is on daily spiriva, qvar, brovanna.  Pt started feeling bad 4 days ago. She is using her inhaler every 4-6 hours with some relief. She is very tired. She was walking around house last night and got tired before getting to her bedroom. Her cough is productive. She has taken mucinex with some benefit. Denies any fever, chills, body aches, n/v/d. Some ear fullness noted. No ST. Recently had overnight oximetry and was normal.  .    Review of Systems     Objective:   Physical Exam  Constitutional: She appears well-developed and well-nourished.  HENT:  Head: Normocephalic and atraumatic.  Right Ear: External ear normal.  Left Ear: External ear normal.  Nose: Nose normal.  Mouth/Throat: Oropharynx is clear and moist.  TM's clear bilaterally.  Negative for maxillary or frontal sinus tenderness to palpation.   Eyes: Conjunctivae are normal. Right eye exhibits no discharge. Left eye exhibits no discharge.  Neck: Normal range of motion. Neck supple.  Cardiovascular: Normal rate and regular rhythm.   Murmur heard. Systolic murmur 2/6 best heard over 2nd intercostal space.   Pulmonary/Chest: Effort normal.  Rhonchi bilateral lungs at base. No wheezing heard today.   Lymphadenopathy:    She has no cervical adenopathy.  Skin: Skin is dry.  Psychiatric: She has a normal mood and affect. Her behavior is normal.          Assessment & Plan:  COPD exacerbation/Acute bronchitis- Pulse ox 95 percent today. Gave prednisone burst with doxycycline for 10 days. Tessalon for cough. Call if worsening or not improving.

## 2013-11-22 DIAGNOSIS — R011 Cardiac murmur, unspecified: Secondary | ICD-10-CM

## 2013-11-22 HISTORY — DX: Cardiac murmur, unspecified: R01.1

## 2013-11-25 ENCOUNTER — Telehealth: Payer: Self-pay | Admitting: *Deleted

## 2013-11-25 NOTE — Telephone Encounter (Signed)
This pt has a no show for 11/21/13 but was seen by Luvenia Starch on that day.  Is there any way we can make sure that she doesn't get charged for a no show?

## 2013-11-28 ENCOUNTER — Ambulatory Visit (INDEPENDENT_AMBULATORY_CARE_PROVIDER_SITE_OTHER): Payer: Medicare Other | Admitting: Physician Assistant

## 2013-11-28 ENCOUNTER — Encounter: Payer: Self-pay | Admitting: Physician Assistant

## 2013-11-28 VITALS — BP 119/55 | HR 75 | Wt 142.0 lb

## 2013-11-28 DIAGNOSIS — F809 Developmental disorder of speech and language, unspecified: Secondary | ICD-10-CM

## 2013-11-28 DIAGNOSIS — R4689 Other symptoms and signs involving appearance and behavior: Secondary | ICD-10-CM

## 2013-11-28 DIAGNOSIS — R6889 Other general symptoms and signs: Secondary | ICD-10-CM | POA: Diagnosis not present

## 2013-11-28 DIAGNOSIS — R413 Other amnesia: Secondary | ICD-10-CM | POA: Diagnosis not present

## 2013-11-28 DIAGNOSIS — R4789 Other speech disturbances: Secondary | ICD-10-CM

## 2013-11-28 HISTORY — DX: Other symptoms and signs involving appearance and behavior: R46.89

## 2013-11-28 LAB — CBC
HCT: 36.2 % (ref 36.0–46.0)
Hemoglobin: 12.6 g/dL (ref 12.0–15.0)
MCH: 30 pg (ref 26.0–34.0)
MCHC: 34.8 g/dL (ref 30.0–36.0)
MCV: 86.2 fL (ref 78.0–100.0)
Platelets: 396 10*3/uL (ref 150–400)
RBC: 4.2 MIL/uL (ref 3.87–5.11)
RDW: 14.6 % (ref 11.5–15.5)
WBC: 7.6 10*3/uL (ref 4.0–10.5)

## 2013-11-28 LAB — VITAMIN B12: Vitamin B-12: 629 pg/mL (ref 211–911)

## 2013-11-28 LAB — FOLATE: Folate: >20 ng/mL

## 2013-11-28 LAB — RPR

## 2013-11-28 LAB — SEDIMENTATION RATE: Sed Rate: 4 mm/hr (ref 0–22)

## 2013-11-28 NOTE — Progress Notes (Signed)
   Subjective:    Patient ID: [name], female    DOB: Nov 20, 1945, 68 y.o.   MRN: [id number]  HPI Pt comes into today to be evaluated for memory loss, word finding difficulty, speech slurring. Is very concerned she is developing Alzheimer's. She is currently under a lot of stress at home. Her brother is dying of cancer. I asked her what was the most concerning symptom that she had today she said it was when she yelled at her daughter for a mistake she had made. She said she really regretted that and it made her feel like she was washing out when she shouldn't be. Other symptoms include when she's talking sometimes she has to pause a minute to think about her words. Husband reports for a period of a couple days last week she just seemed out of it. No family of Alzheimer's. Patient denies any vision changes. She has an occasional headache but is not changing or cause of concern. She denies any upper or lower extremity muscle weakness.   Review of Systems     Objective:   Physical Exam  Constitutional: She is oriented to person, place, and time. She appears well-developed and well-nourished.  HENT:  Head: Normocephalic and atraumatic.  Eyes: Conjunctivae and EOM are normal. Pupils are equal, round, and reactive to light. Right eye exhibits no discharge. Left eye exhibits no discharge.  Neurological: She is alert and oriented to person, place, and time. She has normal reflexes. No cranial nerve deficit. Coordination normal.  Skin: Skin is dry.  Psychiatric: She has a normal mood and affect. Her behavior is normal.          Assessment & Plan:  Memory loss/word finding difficulty/episode of unusual behavior/Mini-Mental status exam was 29/30 patient is 5 and her high school education level she scores normal on exam. Will also get dementia blood work panel. Since patient is having some word finding difficulty as well as her husband speech slurring I feel like we need to do a CT scan of head.  Neurological exam today was normal but with history of lung cancer and possible metastasis have to be on any cautious side of changes. Explained to the patient that some dementia is in normal.. Encouraged her to do word cross puzzles and placed graveled keep her normal neuronal connections forming and making new connections. When asked the patient the most concerning finding was when she yelled at her daughter. Patient remembers the conversation and daughter did do what she accused her of. . She feels like that was not in her personality. I explained patient that that is not necessarily a sign of Alzheimer's said she remembers it and it really did happen. Alzheimer's dementia and is definitely extreme behavior was noted to call us. Pt does not want to get CT of brain at this point. Will wait. Pt is also under a lot of stress/anxiety. Concerned this could be affecting her memory/speech. Discussed starting some medication. Pt does not want to at this time.    Spent 30 minutes the patient greater than 50% of visit spent counseling patient regarding behavior and memory loss.

## 2013-12-01 ENCOUNTER — Other Ambulatory Visit: Payer: Self-pay | Admitting: *Deleted

## 2013-12-01 DIAGNOSIS — F329 Major depressive disorder, single episode, unspecified: Secondary | ICD-10-CM

## 2013-12-01 DIAGNOSIS — F32A Depression, unspecified: Secondary | ICD-10-CM

## 2013-12-01 DIAGNOSIS — F419 Anxiety disorder, unspecified: Principal | ICD-10-CM

## 2013-12-01 MED ORDER — ALPRAZOLAM 1 MG PO TABS: ORAL_TABLET | ORAL | Status: DC

## 2013-12-16 ENCOUNTER — Other Ambulatory Visit: Payer: Self-pay | Admitting: Physician Assistant

## 2014-01-27 ENCOUNTER — Ambulatory Visit (INDEPENDENT_AMBULATORY_CARE_PROVIDER_SITE_OTHER): Payer: Medicare Other | Admitting: Physician Assistant

## 2014-01-27 ENCOUNTER — Encounter: Payer: Self-pay | Admitting: Physician Assistant

## 2014-01-27 VITALS — BP 160/62 | HR 58 | Wt 141.0 lb

## 2014-01-27 DIAGNOSIS — B029 Zoster without complications: Secondary | ICD-10-CM

## 2014-01-27 DIAGNOSIS — R21 Rash and other nonspecific skin eruption: Secondary | ICD-10-CM

## 2014-01-27 MED ORDER — DESVENLAFAXINE SUCCINATE ER 50 MG PO TB24: 50.0000 mg | ORAL_TABLET | Freq: Every day | ORAL | Status: DC

## 2014-01-27 MED ORDER — LIDOCAINE 5 % EX OINT: 1.0000 "application " | TOPICAL_OINTMENT | CUTANEOUS | Status: DC | PRN

## 2014-01-27 MED ORDER — PREDNISONE 50 MG PO TABS: ORAL_TABLET | ORAL | Status: DC

## 2014-01-27 MED ORDER — METHYLPREDNISOLONE SODIUM SUCC 125 MG IJ SOLR
125.0000 mg | Freq: Once | INTRAMUSCULAR | Status: AC
  Administered 2014-01-27: 125 mg via INTRAMUSCULAR

## 2014-01-27 MED ORDER — VALACYCLOVIR HCL 1 G PO TABS: 1000.0000 mg | ORAL_TABLET | Freq: Three times a day (TID) | ORAL | Status: DC

## 2014-01-27 NOTE — Patient Instructions (Signed)
Shingles Shingles (herpes zoster) is an infection that is caused by the same virus that causes chickenpox (varicella). The infection causes a painful skin rash and fluid-filled blisters, which eventually break open, crust over, and heal. It may occur in any area of the body, but it usually affects only one side of the body or face. The pain of shingles usually lasts about 1 month. However, some people with shingles may develop long-term (chronic) pain in the affected area of the body. Shingles often occurs many years after the person had chickenpox. It is more common:  In people older than 50 years.  In people with weakened immune systems, such as those with HIV, AIDS, or cancer.  In people taking medicines that weaken the immune system, such as transplant medicines.  In people under great stress. CAUSES  Shingles is caused by the varicella zoster virus (VZV), which also causes chickenpox. After a person is infected with the virus, it can remain in the person's body for years in an inactive state (dormant). To cause shingles, the virus reactivates and breaks out as an infection in a nerve root. The virus can be spread from person to person (contagious) through contact with open blisters of the shingles rash. It will only spread to people who have not had chickenpox. When these people are exposed to the virus, they may develop chickenpox. They will not develop shingles. Once the blisters scab over, the person is no longer contagious and cannot spread the virus to others. SYMPTOMS  Shingles shows up in stages. The initial symptoms may be pain, itching, and tingling in an area of the skin. This pain is usually described as burning, stabbing, or throbbing.In a few days or weeks, a painful red rash will appear in the area where the pain, itching, and tingling were felt. The rash is usually on one side of the body in a band or belt-like pattern. Then, the rash usually turns into fluid-filled blisters. They  will scab over and dry up in approximately 2 3 weeks. Flu-like symptoms may also occur with the initial symptoms, the rash, or the blisters. These may include:  Fever.  Chills.  Headache.  Upset stomach. DIAGNOSIS  Your caregiver will perform a skin exam to diagnose shingles. Skin scrapings or fluid samples may also be taken from the blisters. This sample will be examined under a microscope or sent to a lab for further testing. TREATMENT  There is no specific cure for shingles. Your caregiver will likely prescribe medicines to help you manage the pain, recover faster, and avoid long-term problems. This may include antiviral drugs, anti-inflammatory drugs, and pain medicines. HOME CARE INSTRUCTIONS   Take a cool bath or apply cool compresses to the area of the rash or blisters as directed. This may help with the pain and itching.   Only take over-the-counter or prescription medicines as directed by your caregiver.   Rest as directed by your caregiver.  Keep your rash and blisters clean with mild soap and cool water or as directed by your caregiver.  Do not pick your blisters or scratch your rash. Apply an anti-itch cream or numbing creams to the affected area as directed by your caregiver.  Keep your shingles rash covered with a loose bandage (dressing).  Avoid skin contact with:  Babies.   Pregnant women.   Children with eczema.   Elderly people with transplants.   People with chronic illnesses, such as leukemia or AIDS.   Wear loose-fitting clothing to help ease   the pain of material rubbing against the rash.  Keep all follow-up appointments with your caregiver.If the area involved is on your face, you may receive a referral for follow-up to a specialist, such as an eye doctor (ophthalmologist) or an ear, nose, and throat (ENT) doctor. Keeping all follow-up appointments will help you avoid eye complications, chronic pain, or disability.  SEEK IMMEDIATE MEDICAL  CARE IF:   You have facial pain, pain around the eye area, or loss of feeling on one side of your face.  You have ear pain or ringing in your ear.  You have loss of taste.  Your pain is not relieved with prescribed medicines.   Your redness or swelling spreads.   You have more pain and swelling.  Your condition is worsening or has changed.   You have a feveror persistent symptoms for more than 2 3 days.  You have a fever and your symptoms suddenly get worse. MAKE SURE YOU:  Understand these instructions.  Will watch your condition.  Will get help right away if you are not doing well or get worse. Document Released: 10/06/2005 Document Revised: 06/30/2012 Document Reviewed: 05/20/2012 [employer] Patient Information 2014 [employer].  

## 2014-01-27 NOTE — Progress Notes (Signed)
   Subjective:    Patient ID: [name], female    DOB: 1946-03-26, 68 y.o.   MRN: [id number]  HPI Pt presents to the clinic with rash that started on right neck and down right arm 2 days ago and has started to spread to left neck. She describes the rash as burning sensation as well as itchy. She denies any sun exposure. She has tried a new soap but she did wash her whole body in it. She has not tried anything to make better. She is rasing her grandchildren and under a lot of stress continually. She did have zostavax.        Review of Systems     Objective:   Physical Exam  Constitutional: She is oriented to person, place, and time. She appears well-developed and well-nourished.  HENT:  Head: Normocephalic and atraumatic.  Neck:    Cardiovascular: Normal rate, regular rhythm and normal heart sounds.   Pulmonary/Chest: Effort normal and breath sounds normal.  Neurological: She is alert and oriented to person, place, and time.  Psychiatric: She has a normal mood and affect. Her behavior is normal.          Assessment & Plan:  Rash- shingles vs contact dermatitis. It is on two dermatomes which makes shingles less likey as well as having the vaccines. I did not see any vessicles or blisters however she could have less severe outbreak since having zostavax. I did tell to stop new soap. Gave solumedrol 125mg  IM in office today as well as valtrex, prednisone, lidocaine gel. Follow up of not improving.    Depression- Refill of prestiq given today.

## 2014-02-03 ENCOUNTER — Other Ambulatory Visit: Payer: Self-pay | Admitting: Physician Assistant

## 2014-02-03 ENCOUNTER — Telehealth: Payer: Self-pay | Admitting: *Deleted

## 2014-02-03 MED ORDER — CLOTRIMAZOLE-BETAMETHASONE 1-0.05 % EX CREA: 1.0000 "application " | TOPICAL_CREAM | Freq: Two times a day (BID) | CUTANEOUS | Status: DC

## 2014-02-03 NOTE — Telephone Encounter (Signed)
Pt states she will pick the rx up & that she already has a dermatologist so she will schedule with them on Monday.

## 2014-02-03 NOTE — Telephone Encounter (Signed)
No new oral meds.  Cream didn't help for pain. Prednisone didn't help.  Rash is now all over her stomach & under her breasts.

## 2014-02-03 NOTE — Telephone Encounter (Signed)
Sent antifungal/steroid to help with rash. Follow up on Monday.

## 2014-02-03 NOTE — Telephone Encounter (Signed)
It the rash spreading? Did the prednisone oral help? Cream was just for pain it is lidoderm not supposed to help with itching. Any new oral medications?

## 2014-02-03 NOTE — Telephone Encounter (Signed)
Spoke with pt to let her know about the expiring pristiq pills from our med closet & while I was on the phone with her, she wanted you to know that her rash is getting worse & still itching really bad. Also, the cream you gave her doesn't help.

## 2014-02-06 DIAGNOSIS — L738 Other specified follicular disorders: Secondary | ICD-10-CM | POA: Diagnosis not present

## 2014-02-22 DIAGNOSIS — I6529 Occlusion and stenosis of unspecified carotid artery: Secondary | ICD-10-CM | POA: Diagnosis not present

## 2014-02-27 ENCOUNTER — Other Ambulatory Visit: Payer: Self-pay | Admitting: Physician Assistant

## 2014-02-27 DIAGNOSIS — I6529 Occlusion and stenosis of unspecified carotid artery: Secondary | ICD-10-CM

## 2014-02-27 DIAGNOSIS — F32A Depression, unspecified: Secondary | ICD-10-CM

## 2014-02-27 DIAGNOSIS — F329 Major depressive disorder, single episode, unspecified: Secondary | ICD-10-CM | POA: Diagnosis not present

## 2014-02-27 DIAGNOSIS — I658 Occlusion and stenosis of other precerebral arteries: Secondary | ICD-10-CM | POA: Diagnosis not present

## 2014-02-27 DIAGNOSIS — G459 Transient cerebral ischemic attack, unspecified: Secondary | ICD-10-CM | POA: Diagnosis not present

## 2014-02-27 DIAGNOSIS — F3289 Other specified depressive episodes: Secondary | ICD-10-CM | POA: Diagnosis not present

## 2014-02-27 HISTORY — DX: Occlusion and stenosis of unspecified carotid artery: I65.29

## 2014-02-27 HISTORY — DX: Depression, unspecified: F32.A

## 2014-03-01 ENCOUNTER — Encounter: Payer: Self-pay | Admitting: Physician Assistant

## 2014-03-01 DIAGNOSIS — G459 Transient cerebral ischemic attack, unspecified: Secondary | ICD-10-CM

## 2014-03-01 HISTORY — DX: Transient cerebral ischemic attack, unspecified: G45.9

## 2014-04-05 ENCOUNTER — Other Ambulatory Visit: Payer: Self-pay | Admitting: Physician Assistant

## 2014-04-18 ENCOUNTER — Telehealth: Payer: Self-pay | Admitting: Critical Care Medicine

## 2014-04-18 DIAGNOSIS — C3490 Malignant neoplasm of unspecified part of unspecified bronchus or lung: Secondary | ICD-10-CM

## 2014-04-18 NOTE — Telephone Encounter (Signed)
ATC pt line rang several times and no answer wcb

## 2014-04-19 NOTE — Telephone Encounter (Signed)
Needs CT Chest NO contrast.  Hx of lung cancer

## 2014-04-19 NOTE — Telephone Encounter (Signed)
Order placed for CT Pt aware. Nothing further needed.

## 2014-04-19 NOTE — Telephone Encounter (Signed)
Called spoke with patient who reported that she would like to know which test she is due for: cxr or ct chest - she states PW alternates.  Pt denies any issues at this time and does not have any upcoming appts.  Last ov w/ PW 8.21.14 with CT chest done on 8.14.15 Patient Instructions     No change in medications  Remember flu vaccine this fall  Return 6 months   Pt saw [place] acutely on 9.3.14 and had cxr and VQ scan done 9.5.14  Dr Joya Gaskins please advise, thank you.

## 2014-04-26 ENCOUNTER — Ambulatory Visit (INDEPENDENT_AMBULATORY_CARE_PROVIDER_SITE_OTHER)
Admission: RE | Admit: 2014-04-26 | Discharge: 2014-04-26 | Disposition: A | Discharge status: Home / Self Care | Payer: Medicare Other | Source: Ambulatory Visit | Attending: Critical Care Medicine | Admitting: Critical Care Medicine

## 2014-04-26 DIAGNOSIS — C3410 Malignant neoplasm of upper lobe, unspecified bronchus or lung: Secondary | ICD-10-CM | POA: Diagnosis not present

## 2014-04-26 DIAGNOSIS — C3490 Malignant neoplasm of unspecified part of unspecified bronchus or lung: Secondary | ICD-10-CM

## 2014-04-26 NOTE — Progress Notes (Signed)
Quick Note:  lmomtcb for pt on home and cell #s ______

## 2014-04-27 ENCOUNTER — Telehealth: Payer: Self-pay | Admitting: Critical Care Medicine

## 2014-04-27 NOTE — Progress Notes (Signed)
Quick Note:  Spoke with pt. Informed her of results per PW. She verbalized understanding. ______

## 2014-04-27 NOTE — Telephone Encounter (Signed)
I was calling to inform pt of below CT Chest results:  Notes Recorded by Elsie Stain, MD on 04/26/2014 at 4:39 PM Tell pt NO evidence for recurrent Cancer, no metastatic disease  -------  Called, spoke with pt.  Informed her of above results per PW.  She verbalized understanding and voiced no further questions or concerns at this time.

## 2014-05-11 ENCOUNTER — Ambulatory Visit (INDEPENDENT_AMBULATORY_CARE_PROVIDER_SITE_OTHER): Payer: Medicare Other | Admitting: Critical Care Medicine

## 2014-05-11 ENCOUNTER — Encounter: Payer: Self-pay | Admitting: Critical Care Medicine

## 2014-05-11 VITALS — BP 140/66 | HR 88 | Temp 98.6°F | Ht 63.0 in | Wt 144.0 lb

## 2014-05-11 DIAGNOSIS — J449 Chronic obstructive pulmonary disease, unspecified: Secondary | ICD-10-CM | POA: Diagnosis not present

## 2014-05-11 DIAGNOSIS — Z23 Encounter for immunization: Secondary | ICD-10-CM

## 2014-05-11 DIAGNOSIS — J4489 Other specified chronic obstructive pulmonary disease: Secondary | ICD-10-CM

## 2014-05-11 DIAGNOSIS — Z8781 Personal history of (healed) traumatic fracture: Secondary | ICD-10-CM | POA: Diagnosis not present

## 2014-05-11 MED ORDER — BECLOMETHASONE DIPROPIONATE 40 MCG/ACT IN AERS: 2.0000 | INHALATION_SPRAY | Freq: Two times a day (BID) | RESPIRATORY_TRACT | Status: DC

## 2014-05-11 MED ORDER — ARFORMOTEROL TARTRATE 15 MCG/2ML IN NEBU: 15.0000 ug | INHALATION_SOLUTION | Freq: Two times a day (BID) | RESPIRATORY_TRACT | Status: DC

## 2014-05-11 MED ORDER — TIOTROPIUM BROMIDE MONOHYDRATE 18 MCG IN CAPS: 18.0000 ug | ORAL_CAPSULE | Freq: Every day | RESPIRATORY_TRACT | Status: DC

## 2014-05-11 MED ORDER — ALBUTEROL SULFATE (2.5 MG/3ML) 0.083% IN NEBU: 2.5000 mg | INHALATION_SOLUTION | Freq: Four times a day (QID) | RESPIRATORY_TRACT | Status: DC | PRN

## 2014-05-11 MED ORDER — ALBUTEROL SULFATE HFA 108 (90 BASE) MCG/ACT IN AERS: 2.0000 | INHALATION_SPRAY | Freq: Four times a day (QID) | RESPIRATORY_TRACT | Status: DC | PRN

## 2014-05-11 NOTE — Patient Instructions (Signed)
Prevnar 13 was given Refills given Return 1 year

## 2014-05-11 NOTE — Progress Notes (Signed)
Subjective:    Patient ID: [name], female    DOB: 08/05/1946, 68 y.o.   MRN: [id number]  HPI    Review of Systems      Physical Exam  Subjective:  68 y.o.   woman with COPD primary emphysema Gold Stage III FeV1 39%  Pt with hx of Lung Ca dx 2005 and partial resection per Dr Arlyce Dice. No hx of COPD until LUL partial resection. Had XRT implants.   05/11/2014 Chief Complaint  Patient presents with  . Follow-up    Pt states her breathing is stable.  Pt c/o prod cough with clear mucus, chest tightness and SOB with exertion.  CAT score 18.   Pt has cough and mucus. Notes mild chest pain. Mucus is clear.  Notes mild wheeze Pt denies any significant sore throat, nasal congestion or excess secretions, fever, chills, sweats, unintended weight loss, pleurtic or exertional chest pain, orthopnea PND, or leg swelling Pt denies any increase in rescue therapy over baseline, denies waking up needing it or having any early am or nocturnal exacerbations of coughing/wheezing/or dyspnea. Pt also denies any obvious fluctuation in symptoms with  weather or environmental change or other alleviating or aggravating factors    CAT Score 05/11/2014 06/09/2013 11/29/2012  Total CAT Score 18 8 20        Objective:    Filed Vitals:   05/11/14 1153  BP: 140/66  Pulse: 88  Temp: 98.6 F (37 C)  TempSrc: Oral  Height: 5\' 3"  (1.6 m)  Weight: 144 lb (65.318 kg)  SpO2: 95%    Gen: Pleasant, well-nourished, in no distress,  normal affect  ENT: No lesions,  mouth clear,  oropharynx clear, no postnasal drip  Neck: No JVD, no TMG, no carotid bruits  Lungs: No use of accessory muscles, no dullness to percussion, distant bs   Cardiovascular: RRR, heart sounds normal, no murmur or gallops, no peripheral edema  Abdomen: soft and NT, no HSM,  BS normal  Musculoskeletal: No deformities, no cyanosis or clubbing  Neuro: alert, non focal  Skin: Warm, no lesions or rashes  Recent CT scan of the  chest reviewed and showed no recurrence in chest     Assessment & Plan:   COPD gold stage C. Gold stage C. COPD with no evidence of lung cancer recurrence in stable lung disease Plan Will administer Prevnar 13 vaccine No change in inhaled medications and refills were given for 3 month supplies     Updated Medication List Outpatient Encounter Prescriptions as of 05/11/2014  Medication Sig  . albuterol (PROVENTIL HFA) 108 (90 BASE) MCG/ACT inhaler Inhale 2 puffs into the lungs 4 (four) times daily as needed. Shortness of breath  . albuterol (PROVENTIL) (2.5 MG/3ML) 0.083% nebulizer solution Take 3 mLs (2.5 mg total) by nebulization every 6 (six) hours as needed.  Marland Kitchen amLODipine (NORVASC) 5 MG tablet Take 1 tablet (5 mg total) by mouth daily.  Marland Kitchen arformoterol (BROVANA) 15 MCG/2ML NEBU Take 2 mLs (15 mcg total) by nebulization 2 (two) times daily. 2 ml neb BID  . atorvastatin (LIPITOR) 10 MG tablet take 1 tablet by mouth once daily  . beclomethasone (QVAR) 40 MCG/ACT inhaler Inhale 2 puffs into the lungs 2 (two) times daily.  . clopidogrel (PLAVIX) 75 MG tablet Take 1 tablet (75 mg total) by mouth daily.  Marland Kitchen desvenlafaxine (PRISTIQ) 50 MG 24 hr tablet Take 1 tablet (50 mg total) by mouth daily.  Marland Kitchen levothyroxine (SYNTHROID, LEVOTHROID) 50 MCG tablet take 1  tablet by mouth once daily  . multivitamin-iron-minerals-folic acid (CENTRUM) chewable tablet Chew 1 tablet by mouth daily.    . QUEtiapine (SEROQUEL) 50 MG tablet Take 3-4 tablets (150-200 mg total) by mouth at bedtime.  Marland Kitchen Spacer/Aero-Holding Chambers (AEROCHAMBER MV) inhaler Use as instructed with qvar and proventil   . tiotropium (SPIRIVA) 18 MCG inhalation capsule Place 1 capsule (18 mcg total) into inhaler and inhale daily.  . [DISCONTINUED] albuterol (PROVENTIL HFA) 108 (90 BASE) MCG/ACT inhaler Inhale 2 puffs into the lungs 4 (four) times daily as needed. Shortness of breath  . [DISCONTINUED] albuterol (PROVENTIL) (2.5 MG/3ML) 0.083%  nebulizer solution Take 3 mLs (2.5 mg total) by nebulization every 6 (six) hours as needed.  . [DISCONTINUED] arformoterol (BROVANA) 15 MCG/2ML NEBU Take 2 mLs (15 mcg total) by nebulization 2 (two) times daily. 2 ml neb BID  . [DISCONTINUED] beclomethasone (QVAR) 40 MCG/ACT inhaler Inhale 2 puffs into the lungs 2 (two) times daily.  . [DISCONTINUED] tiotropium (SPIRIVA) 18 MCG inhalation capsule Place 1 capsule (18 mcg total) into inhaler and inhale daily.  . [DISCONTINUED] ALPRAZolam (XANAX) 1 MG tablet Take 1 tab by mouth once daily as needed for anxiety  . [DISCONTINUED] benzonatate (TESSALON) 100 MG capsule Take 1 capsule (100 mg total) by mouth 2 (two) times daily as needed for cough.  . [DISCONTINUED] clotrimazole-betamethasone (LOTRISONE) cream Apply 1 application topically 2 (two) times daily.  . [DISCONTINUED] doxycycline (VIBRA-TABS) 100 MG tablet Take 1 tablet (100 mg total) by mouth 2 (two) times daily.  . [DISCONTINUED] famotidine (PEPCID) 20 MG tablet Take 2 tablets (40 mg total) by mouth at bedtime.  . [DISCONTINUED] lidocaine (XYLOCAINE) 5 % ointment Apply 1 application topically as needed.  . [DISCONTINUED] metoprolol tartrate (LOPRESSOR) 25 MG tablet take 1 tablet by mouth twice a day  . [DISCONTINUED] predniSONE (DELTASONE) 50 MG tablet Take 1 tablet for 5 days.  . [DISCONTINUED] valACYclovir (VALTREX) 1000 MG tablet Take 1 tablet (1,000 mg total) by mouth 3 (three) times daily. For 7 days.

## 2014-05-11 NOTE — Assessment & Plan Note (Signed)
Gold stage C. COPD with no evidence of lung cancer recurrence in stable lung disease Plan Will administer Prevnar 13 vaccine No change in inhaled medications and refills were given for 3 month supplies

## 2014-05-13 ENCOUNTER — Other Ambulatory Visit: Payer: Self-pay | Admitting: Physician Assistant

## 2014-08-04 ENCOUNTER — Other Ambulatory Visit: Payer: Self-pay

## 2014-08-07 ENCOUNTER — Encounter: Payer: Self-pay | Admitting: Physician Assistant

## 2014-08-07 ENCOUNTER — Ambulatory Visit (INDEPENDENT_AMBULATORY_CARE_PROVIDER_SITE_OTHER): Payer: Medicare Other | Admitting: Physician Assistant

## 2014-08-07 VITALS — BP 150/83 | HR 96 | Ht 63.0 in | Wt 147.0 lb

## 2014-08-07 DIAGNOSIS — E785 Hyperlipidemia, unspecified: Secondary | ICD-10-CM | POA: Diagnosis not present

## 2014-08-07 DIAGNOSIS — Z131 Encounter for screening for diabetes mellitus: Secondary | ICD-10-CM | POA: Diagnosis not present

## 2014-08-07 DIAGNOSIS — E7800 Pure hypercholesterolemia, unspecified: Secondary | ICD-10-CM

## 2014-08-07 DIAGNOSIS — E039 Hypothyroidism, unspecified: Secondary | ICD-10-CM

## 2014-08-07 DIAGNOSIS — F330 Major depressive disorder, recurrent, mild: Secondary | ICD-10-CM

## 2014-08-07 DIAGNOSIS — E890 Postprocedural hypothyroidism: Secondary | ICD-10-CM

## 2014-08-07 DIAGNOSIS — F411 Generalized anxiety disorder: Secondary | ICD-10-CM

## 2014-08-07 MED ORDER — QUETIAPINE FUMARATE 50 MG PO TABS: 150.0000 mg | ORAL_TABLET | Freq: Every day | ORAL | Status: DC

## 2014-08-07 MED ORDER — LEVOTHYROXINE SODIUM 50 MCG PO TABS: ORAL_TABLET | ORAL | Status: DC

## 2014-08-07 MED ORDER — DESVENLAFAXINE SUCCINATE ER 50 MG PO TB24: 50.0000 mg | ORAL_TABLET | Freq: Every day | ORAL | Status: DC

## 2014-08-07 MED ORDER — CLOPIDOGREL BISULFATE 75 MG PO TABS: 75.0000 mg | ORAL_TABLET | Freq: Every day | ORAL | Status: DC

## 2014-08-07 MED ORDER — AMLODIPINE BESYLATE 5 MG PO TABS: 5.0000 mg | ORAL_TABLET | Freq: Every day | ORAL | Status: DC

## 2014-08-07 MED ORDER — ALPRAZOLAM 1 MG PO TABS: 1.0000 mg | ORAL_TABLET | ORAL | Status: DC | PRN

## 2014-08-07 MED ORDER — ATORVASTATIN CALCIUM 10 MG PO TABS: ORAL_TABLET | ORAL | Status: DC

## 2014-08-07 NOTE — Progress Notes (Signed)
   Subjective:    Patient ID: [name], female    DOB: [birth year], 68 y.o.   MRN: [id number]  HPI Pt presents to the clinic for 6 month medication refill and flu shot.   She has no concerns or complaints today.   HTN- no CP, palpitations, SOB, vision changes.   Depression- doing well.      Review of Systems  All other systems reviewed and are negative.      Objective:   Physical Exam  Constitutional: She is oriented to person, place, and time. She appears well-developed and well-nourished.  HENT:  Head: Normocephalic and atraumatic.  Cardiovascular: Normal rate, regular rhythm and normal heart sounds.   Pulmonary/Chest: Effort normal and breath sounds normal.  Neurological: She is alert and oriented to person, place, and time.  Skin: Skin is dry.  Psychiatric: She has a normal mood and affect. Her behavior is normal.          Assessment & Plan:  HtN- discussed BP on high normal side today. Will refill for 6 months. Keep an eye on BP. If above 150/90 regularly follow up.   Hypercholesterolemia- gave lab slip to have drawn early December. Refilled medication.   Ischemic heart disease/carotid narrowing/CVA- refilled plavix daily for one year.   Depression- refilled prestiq for 6 months.   Insomnia- refilled for 6 months.   COPD- managed by Dr. Joya Gaskins.    Flu shot given without complication.

## 2014-09-25 DIAGNOSIS — Z131 Encounter for screening for diabetes mellitus: Secondary | ICD-10-CM | POA: Diagnosis not present

## 2014-09-25 DIAGNOSIS — E039 Hypothyroidism, unspecified: Secondary | ICD-10-CM | POA: Diagnosis not present

## 2014-09-25 DIAGNOSIS — E785 Hyperlipidemia, unspecified: Secondary | ICD-10-CM | POA: Diagnosis not present

## 2014-09-26 LAB — COMPLETE METABOLIC PANEL WITH GFR
ALT: 21 U/L (ref 0–35)
AST: 23 U/L (ref 0–37)
Albumin: 4.3 g/dL (ref 3.5–5.2)
Alkaline Phosphatase: 84 U/L (ref 39–117)
BUN: 9 mg/dL (ref 6–23)
CO2: 26 mEq/L (ref 19–32)
Calcium: 9.2 mg/dL (ref 8.4–10.5)
Chloride: 99 mEq/L (ref 96–112)
Creat: 0.52 mg/dL (ref 0.50–1.10)
GFR, Est African American: >89 mL/min
GFR, Est Non African American: >89 mL/min
Glucose, Bld: 86 mg/dL (ref 70–99)
Potassium: 3.8 mEq/L (ref 3.5–5.3)
Sodium: 138 mEq/L (ref 135–145)
Total Bilirubin: 0.6 mg/dL (ref 0.2–1.2)
Total Protein: 6.8 g/dL (ref 6.0–8.3)

## 2014-09-26 LAB — TSH: TSH: 1.705 u[IU]/mL (ref 0.350–4.500)

## 2014-09-26 LAB — LIPID PANEL
Cholesterol: 175 mg/dL (ref 0–200)
HDL: 94 mg/dL (ref >39)
LDL Cholesterol: 67 mg/dL (ref 0–99)
Total CHOL/HDL Ratio: 1.9 Ratio
Triglycerides: 70 mg/dL (ref <150)
VLDL: 14 mg/dL (ref 0–40)

## 2014-09-26 LAB — T4, FREE: Free T4: 1.39 ng/dL (ref 0.80–1.80)

## 2014-11-20 DIAGNOSIS — Z1231 Encounter for screening mammogram for malignant neoplasm of breast: Secondary | ICD-10-CM | POA: Diagnosis not present

## 2014-12-01 ENCOUNTER — Other Ambulatory Visit: Payer: Self-pay | Admitting: Physician Assistant

## 2014-12-26 ENCOUNTER — Ambulatory Visit (INDEPENDENT_AMBULATORY_CARE_PROVIDER_SITE_OTHER): Payer: Medicare Other

## 2014-12-26 ENCOUNTER — Encounter: Payer: Self-pay | Admitting: Family Medicine

## 2014-12-26 ENCOUNTER — Ambulatory Visit (INDEPENDENT_AMBULATORY_CARE_PROVIDER_SITE_OTHER): Payer: Medicare Other | Admitting: Family Medicine

## 2014-12-26 VITALS — BP 140/78 | HR 104 | Temp 98.2°F | Wt 148.0 lb

## 2014-12-26 DIAGNOSIS — J441 Chronic obstructive pulmonary disease with (acute) exacerbation: Secondary | ICD-10-CM | POA: Diagnosis not present

## 2014-12-26 DIAGNOSIS — R062 Wheezing: Secondary | ICD-10-CM

## 2014-12-26 DIAGNOSIS — M818 Other osteoporosis without current pathological fracture: Secondary | ICD-10-CM

## 2014-12-26 DIAGNOSIS — R0781 Pleurodynia: Secondary | ICD-10-CM | POA: Diagnosis not present

## 2014-12-26 DIAGNOSIS — C3490 Malignant neoplasm of unspecified part of unspecified bronchus or lung: Secondary | ICD-10-CM | POA: Diagnosis not present

## 2014-12-26 MED ORDER — HYDROCODONE-HOMATROPINE 5-1.5 MG/5ML PO SYRP: 5.0000 mL | ORAL_SOLUTION | Freq: Three times a day (TID) | ORAL | Status: DC | PRN

## 2014-12-26 MED ORDER — PREDNISONE 20 MG PO TABS: ORAL_TABLET | ORAL | Status: AC

## 2014-12-26 NOTE — Progress Notes (Signed)
CC: [name] is a 69 y.o. female is here for Cough   Subjective: HPI:  Left-sided chest wall pain underneath her left breast that has been present for the past 2 days. Symptoms came on suddenly with a coughing episode 2 days ago. Symptoms have been persistent ever since. Worse with deep breathing or pressing on the site of pain. Improves with shallow breathing. Nothing else has made better or worse. Pain is moderate in severity. Nonradiating. Denies any overlying skin changes.  Wheezing has been worse than her baseline. Present all hours of the day but slightly improved with albuterol. This was proceeded by a upper respiratory infection with nasal congestion that has now resolved but seemed to transition into worsened wheezing. She notes that she is coughing more frequently but overall producing the same amount of sputum that she feels is her baseline. No shortness of breath, blood in sputum, fevers, chills, nor back pain.     Review Of Systems Outlined In HPI  Past Medical History  Diagnosis Date  . CAD (coronary artery disease)     20% stenosis on cath - no intervention required (W-S cards), negative nuclear stress test 11/07  . Hurthle cell adenoma of thyroid 2007  . Cancer 2007    lung (Dr. Earlie Server and Dr. Arlyce Dice)  . RLS (restless legs syndrome)   . Peripheral vascular disease   . Iron deficiency anemia 2/08    s/p 2 unit transfusion  . Hypertension   . COPD (chronic obstructive pulmonary disease) 9/10    golds stage III, FeV1 39%  . Lung cancer   . Bronchopleural fistula     Past Surgical History  Procedure Laterality Date  . Appendectomy  19  . Heart cartherization    . Cholecystectomy  19  . Lul  2005    LUL wedge resection/VATS  . Thyroidectomy, partial  2007    Dr. Arlyce Dice  . Lul  4/08    LUL lobectomy for cystic cavity and Candida, no cancer seen   Family History  Problem Relation Age of Onset  . Cancer Mother     Larynx & Endometrial cancer  . Cancer  Father     laryngeal cancer  . Hypertension Father   . Diabetes Sister   . Multiple sclerosis Sister   . Cancer Sister 23    breast    History   Social History  . Marital Status: Married    Spouse Name: N/A  . Number of Children: N/A  . Years of Education: N/A   Occupational History  . Not on file.   Social History Main Topics  . Smoking status: Former Smoker -- 1.50 packs/day for 30 years    Types: Cigarettes    Quit date: 10/20/2004  . Smokeless tobacco: Never Used  . Alcohol Use: No  . Drug Use: No  . Sexual Activity: Not on file   Other Topics Concern  . Not on file   Social History Narrative     Objective: BP 140/78 mmHg  Pulse 104  Temp(Src) 98.2 F (36.8 C) (Oral)  Wt 148 lb (67.132 kg)  General: Alert and Oriented, No Acute Distress HEENT: Pupils equal, round, reactive to light. Conjunctivae clear.  External ears unremarkable, canals clear with intact TMs with appropriate landmarks.  Middle ear appears open without effusion. Pink inferior turbinates.  Moist mucous membranes, pharynx without inflammation nor lesions.  Neck supple without palpable lymphadenopathy nor abnormal masses. Lungs: Clear to auscultation bilaterally, no wheezing/ronchi/rales.  Comfortable  work of breathing. Good air movement. Cardiac: Regular rate and rhythm. .   When palpating the left and anterior chest wall her pain is reproduced with palpation at the distal eighth rib, no palpable step-off, no crepitus. Extremities: No peripheral edema.  Strong peripheral pulses.  Mental Status: No depression, anxiety, nor agitation. Skin: Warm and dry.  Assessment & Plan: [name] was seen today for cough.  Diagnoses and all orders for this visit:  Rib pain on left side Orders: -     DG Ribs Unilateral Left; Future -     HYDROcodone-homatropine (HYCODAN) 5-1.5 MG/5ML syrup; Take 5 mLs by mouth every 8 (eight) hours as needed for cough.  Wheezing Orders: -     predniSONE (DELTASONE) 20 MG  tablet; Three tabs daily days 1-3, two tabs daily days 4-6, one tab daily days 7-9, half tab daily days 10-13.  COPD exacerbation   Left rib pain: Check an x-ray to evaluate for fracture to help determine healing time. Since pain is worse with coughing and deep breathing provided with Hycodan to help with pain control and cough suppression, sedation warning provided Wheezing due to COPD exacerbation: Fortunately don't think that she needs an antibiotic at this time but she would benefit for prednisone if we can rule out a fracture above. Schedule albuterol every 6 hours, 2 puffs, for the next 48 hours.  She will be notified later today about her chest x-ray and whether or not she could start on prednisone.  Return if symptoms worsen or fail to improve.

## 2015-01-08 ENCOUNTER — Telehealth: Payer: Self-pay | Admitting: Critical Care Medicine

## 2015-01-08 NOTE — Telephone Encounter (Signed)
Patient requesting handwritten Rx for Brovana. Last OV 05/11/14; no OV scheduled.  Patient would like to pick up at office tomorrow.    Ok to fill?

## 2015-01-10 MED ORDER — ARFORMOTEROL TARTRATE 15 MCG/2ML IN NEBU: 15.0000 ug | INHALATION_SOLUTION | Freq: Two times a day (BID) | RESPIRATORY_TRACT | Status: DC

## 2015-01-10 NOTE — Telephone Encounter (Signed)
Ok to fill but will need an OV in next 2-3 months

## 2015-01-10 NOTE — Telephone Encounter (Signed)
Rx has been sent in for the pt. Pt is aware. She is due for an appointment in July. Will call when it gets closer to make appointment.

## 2015-01-27 ENCOUNTER — Other Ambulatory Visit: Payer: Self-pay | Admitting: Physician Assistant

## 2015-02-27 DIAGNOSIS — F329 Major depressive disorder, single episode, unspecified: Secondary | ICD-10-CM | POA: Diagnosis not present

## 2015-02-27 DIAGNOSIS — I6522 Occlusion and stenosis of left carotid artery: Secondary | ICD-10-CM | POA: Diagnosis not present

## 2015-02-27 DIAGNOSIS — G459 Transient cerebral ischemic attack, unspecified: Secondary | ICD-10-CM | POA: Diagnosis not present

## 2015-02-27 DIAGNOSIS — F419 Anxiety disorder, unspecified: Secondary | ICD-10-CM | POA: Diagnosis not present

## 2015-03-15 ENCOUNTER — Ambulatory Visit (INDEPENDENT_AMBULATORY_CARE_PROVIDER_SITE_OTHER): Payer: Medicare Other | Admitting: Family Medicine

## 2015-03-15 ENCOUNTER — Other Ambulatory Visit: Payer: Self-pay

## 2015-03-15 ENCOUNTER — Encounter: Payer: Self-pay | Admitting: Family Medicine

## 2015-03-15 VITALS — BP 130/78 | HR 84 | Ht 63.0 in | Wt 148.0 lb

## 2015-03-15 DIAGNOSIS — E039 Hypothyroidism, unspecified: Secondary | ICD-10-CM

## 2015-03-15 DIAGNOSIS — E7800 Pure hypercholesterolemia, unspecified: Secondary | ICD-10-CM

## 2015-03-15 DIAGNOSIS — F330 Major depressive disorder, recurrent, mild: Secondary | ICD-10-CM

## 2015-03-15 DIAGNOSIS — F411 Generalized anxiety disorder: Secondary | ICD-10-CM

## 2015-03-15 DIAGNOSIS — G4700 Insomnia, unspecified: Secondary | ICD-10-CM

## 2015-03-15 DIAGNOSIS — Z8673 Personal history of transient ischemic attack (TIA), and cerebral infarction without residual deficits: Secondary | ICD-10-CM

## 2015-03-15 LAB — TSH: TSH: 1.658 u[IU]/mL (ref 0.350–4.500)

## 2015-03-15 MED ORDER — ATORVASTATIN CALCIUM 20 MG PO TABS: 20.0000 mg | ORAL_TABLET | Freq: Every day | ORAL | Status: DC

## 2015-03-15 MED ORDER — AMLODIPINE BESYLATE 5 MG PO TABS: 5.0000 mg | ORAL_TABLET | Freq: Every day | ORAL | Status: DC

## 2015-03-15 MED ORDER — SERTRALINE HCL 50 MG PO TABS: ORAL_TABLET | ORAL | Status: DC

## 2015-03-15 MED ORDER — ALPRAZOLAM 1 MG PO TABS: ORAL_TABLET | ORAL | Status: DC

## 2015-03-15 MED ORDER — QUETIAPINE FUMARATE 50 MG PO TABS: 150.0000 mg | ORAL_TABLET | Freq: Every day | ORAL | Status: DC

## 2015-03-15 NOTE — Progress Notes (Signed)
Subjective:    Patient ID: [name], female    DOB: July 11, 1946, 69 y.o.   MRN: [id number]  HPI Hypertension- Pt denies chest pain, SOB, dizziness, or heart palpitations.  Taking meds as directed w/o problems.  Denies medication side effects.    Depression - she is no longer on Pristiq.  Says it was making her shakey and felt more confused. Says S.E went away when she tapered. Sometimes uses her xanax when she is feeling ancious. She uses her xanax every other day. Didn't do well on prozac years ago. She does complain of little just or pleasure doing things nearly every day and feeling down nearly every day. Also feels bad about herself and complains of difficulty with concentration nearly every day. She denies any thoughts of wanting to harm herself.  Hypothyroid - no recent skin or hair changes.  No significant change in energy level. No significant weight changes. Taking 50 mcg daily.    Hyperlipidemia  - based on recent lipids levels her cardiologist received. He wanted her to increase the dose.    Insomnia-I believe she's taking the Seroquel for the insomnia. Is not quite clear to me from the notes.  Review of Systems   BP 130/78 mmHg  Pulse 84  Ht '5\' 3"'$  (1.6 m)  Wt 148 lb (67.132 kg)  BMI 26.22 kg/m2  SpO2 95%    Allergies  Allergen Reactions  . Desvenlafaxine Er Other (See Comments)    shaking  . Lisinopril-Hydrochlorothiazide     REACTION: N/V cough  . Nitrofurantoin Monohyd Macro   . Penicillins     REACTION: Hives    Past Medical History  Diagnosis Date  . CAD (coronary artery disease)     20% stenosis on cath - no intervention required (W-S cards), negative nuclear stress test 11/07  . Hurthle cell adenoma of thyroid 2007  . Cancer 2007    lung (Dr. Earlie Server and Dr. Arlyce Dice)  . RLS (restless legs syndrome)   . Peripheral vascular disease   . Iron deficiency anemia 2/08    s/p 2 unit transfusion  . Hypertension   . COPD (chronic obstructive pulmonary  disease) 9/10    golds stage III, FeV1 39%  . Lung cancer   . Bronchopleural fistula     Past Surgical History  Procedure Laterality Date  . Appendectomy  18  . Heart cartherization    . Cholecystectomy  18  . Lul  2005    LUL wedge resection/VATS  . Thyroidectomy, partial  2007    Dr. Arlyce Dice  . Lul  4/08    LUL lobectomy for cystic cavity and Candida, no cancer seen    History   Social History  . Marital Status: Married    Spouse Name: N/A  . Number of Children: N/A  . Years of Education: N/A   Occupational History  . Not on file.   Social History Main Topics  . Smoking status: Former Smoker -- 1.50 packs/day for 30 years    Types: Cigarettes    Quit date: 10/20/2004  . Smokeless tobacco: Never Used  . Alcohol Use: No  . Drug Use: No  . Sexual Activity: Not on file   Other Topics Concern  . Not on file   Social History Narrative    Family History  Problem Relation Age of Onset  . Cancer Mother     Larynx & Endometrial cancer  . Cancer Father     laryngeal cancer  .  Hypertension Father   . Diabetes Sister   . Multiple sclerosis Sister   . Cancer Sister 72    breast    Outpatient Encounter Prescriptions as of 03/15/2015  Medication Sig  . albuterol (PROVENTIL HFA) 108 (90 BASE) MCG/ACT inhaler Inhale 2 puffs into the lungs 4 (four) times daily as needed. Shortness of breath  . albuterol (PROVENTIL) (2.5 MG/3ML) 0.083% nebulizer solution Take 3 mLs (2.5 mg total) by nebulization every 6 (six) hours as needed.  . ALPRAZolam (XANAX) 1 MG tablet take 1 tablet by mouth once daily if needed  . amLODipine (NORVASC) 5 MG tablet Take 1 tablet (5 mg total) by mouth daily.  Marland Kitchen arformoterol (BROVANA) 15 MCG/2ML NEBU Take 2 mLs (15 mcg total) by nebulization 2 (two) times daily.  Marland Kitchen atorvastatin (LIPITOR) 20 MG tablet Take 1 tablet (20 mg total) by mouth at bedtime. take 1 tablet by mouth once daily  . beclomethasone (QVAR) 40 MCG/ACT inhaler Inhale 2 puffs into the  lungs 2 (two) times daily.  . clopidogrel (PLAVIX) 75 MG tablet Take 1 tablet (75 mg total) by mouth daily.  Marland Kitchen levothyroxine (SYNTHROID, LEVOTHROID) 50 MCG tablet take 1 tablet by mouth once daily  . QUEtiapine (SEROQUEL) 50 MG tablet Take 3-4 tablets (150-200 mg total) by mouth at bedtime.  Marland Kitchen tiotropium (SPIRIVA) 18 MCG inhalation capsule Place 1 capsule (18 mcg total) into inhaler and inhale daily.  . [DISCONTINUED] ALPRAZolam (XANAX) 1 MG tablet take 1 tablet by mouth once daily if needed  . [DISCONTINUED] amLODipine (NORVASC) 5 MG tablet Take 1 tablet (5 mg total) by mouth daily.  . [DISCONTINUED] atorvastatin (LIPITOR) 10 MG tablet take 1 tablet by mouth once daily  . [DISCONTINUED] atorvastatin (LIPITOR) 20 MG tablet Take 1 tablet (20 mg total) by mouth at bedtime. take 1 tablet by mouth once daily  . [DISCONTINUED] QUEtiapine (SEROQUEL) 50 MG tablet Take 3-4 tablets (150-200 mg total) by mouth at bedtime.  . [DISCONTINUED] Spacer/Aero-Holding Chambers (AEROCHAMBER MV) inhaler Use as instructed with qvar and proventil   . sertraline (ZOLOFT) 50 MG tablet 1/2 tab po QD x 1 week, then increase to whole tab daily.  . [DISCONTINUED] desvenlafaxine (PRISTIQ) 50 MG 24 hr tablet Take 1 tablet (50 mg total) by mouth daily.  . [DISCONTINUED] HYDROcodone-homatropine (HYCODAN) 5-1.5 MG/5ML syrup Take 5 mLs by mouth every 8 (eight) hours as needed for cough.  . [DISCONTINUED] multivitamin-iron-minerals-folic acid (CENTRUM) chewable tablet Chew 1 tablet by mouth daily.     No facility-administered encounter medications on file as of 03/15/2015.       Objective:   Physical Exam  Constitutional: She is oriented to person, place, and time. She appears well-developed and well-nourished.  HENT:  Head: Normocephalic and atraumatic.  Cardiovascular: Normal rate, regular rhythm and normal heart sounds.   Pulmonary/Chest: Effort normal and breath sounds normal.  Neurological: She is alert and oriented to  person, place, and time.  Skin: Skin is warm and dry.  Psychiatric: She has a normal mood and affect. Her behavior is normal.          Assessment & Plan:  Insomnia - would eventually like to get her off the Seroquel for sleep. We do need to get her mood under better control first.   Depression- PHQ - 9 score of 19.  I still think she be a great candidate to consider medication.  Discussed options. Will start sertraline. Discussed potential side effects. F/U in 3-4 weeks. Will adjust dose at that  time.   Hyperlpidemia - will adjust her dose of Lipitor from 10 mg to 20 mg. This puts her more at a moderate dose category for her statin which is appropriate based on new guidelines. I think she will tolerate it well without any difficulty. We'll try to get recent copy of lipids done with her cardiologist. She   Hypothyroid - Due to recheck TSH. Continue current regimen if well-regulated.  History of CVA-She did want [place] to know that her cartilages will be prescribing her Plavix and not [place]. I think we refilled it by accident at one point in time and it caused some confusion.

## 2015-03-16 NOTE — Progress Notes (Signed)
Quick Note:  All labs are normal. ______ 

## 2015-03-22 ENCOUNTER — Telehealth: Payer: Self-pay | Admitting: Critical Care Medicine

## 2015-03-22 NOTE — Telephone Encounter (Signed)
Pt aware no cxr needed

## 2015-03-22 NOTE — Telephone Encounter (Signed)
lmomtcb x1 for pt She has appt scheduled for 04/30/15 w. PW Do not see mention about CXR needed

## 2015-03-29 ENCOUNTER — Telehealth: Payer: Self-pay | Admitting: *Deleted

## 2015-03-29 MED ORDER — ATORVASTATIN CALCIUM 20 MG PO TABS: 20.0000 mg | ORAL_TABLET | Freq: Every day | ORAL | Status: DC

## 2015-03-29 NOTE — Telephone Encounter (Signed)
Pt called and stated that she needed her lipitor sent to her mail order pharmacy.[name] [place]

## 2015-04-12 ENCOUNTER — Encounter: Payer: Self-pay | Admitting: Family Medicine

## 2015-04-12 ENCOUNTER — Ambulatory Visit (INDEPENDENT_AMBULATORY_CARE_PROVIDER_SITE_OTHER): Payer: Medicare Other | Admitting: Family Medicine

## 2015-04-12 VITALS — BP 138/80 | HR 89 | Ht 63.0 in | Wt 145.0 lb

## 2015-04-12 DIAGNOSIS — R7301 Impaired fasting glucose: Secondary | ICD-10-CM

## 2015-04-12 DIAGNOSIS — I10 Essential (primary) hypertension: Secondary | ICD-10-CM | POA: Diagnosis not present

## 2015-04-12 DIAGNOSIS — E785 Hyperlipidemia, unspecified: Secondary | ICD-10-CM

## 2015-04-12 DIAGNOSIS — R7309 Other abnormal glucose: Secondary | ICD-10-CM | POA: Diagnosis not present

## 2015-04-12 DIAGNOSIS — F330 Major depressive disorder, recurrent, mild: Secondary | ICD-10-CM

## 2015-04-12 LAB — POCT GLYCOSYLATED HEMOGLOBIN (HGB A1C): Hemoglobin A1C: 5.9

## 2015-04-12 NOTE — Progress Notes (Signed)
   Subjective:    Patient ID: [name], female    DOB: [birth year], 69 y.o.   MRN: [id number]  HPI Depression - says the zoloft gave her headaches, and kept her awake and made her nauseated.  She did try it for a week.  She is not interested therapy.    Abnormal glucose - due to recheck glucose. She is also on atypical antipsychotic.   Hyperlipidemia-. - doing well on inc dose of statin. We increase the dose about a month ago. No myalgias or S.E.    Review of Systems     Objective:   Physical Exam  Constitutional: She is oriented to person, place, and time. She appears well-developed and well-nourished.  HENT:  Head: Normocephalic and atraumatic.  Cardiovascular: Normal rate, regular rhythm and normal heart sounds.   Pulmonary/Chest: Effort normal and breath sounds normal.  Neurological: She is alert and oriented to person, place, and time.  Skin: Skin is warm and dry.  Psychiatric: She has a normal mood and affect. Her behavior is normal.          Assessment & Plan:  Depression - she doesn't want to try another medication and she isn't interested in therapy. PHQ- 9 score of 8.  Her depression questionnaire screen did improved slightly.  Impaired fasting glucose-helical and A1 C of 5.9 today. Discuss that this is in the prediabetic range. Reviewed with her to avoid concentrated sweets into minimize portion sizes on carbohydrates such as breads, rice, pasta etc. We will repeat this in 6 months.  Hyperlpidemia - repeat lipids and liver in about one month since inc dose of medication.

## 2015-04-30 ENCOUNTER — Ambulatory Visit (INDEPENDENT_AMBULATORY_CARE_PROVIDER_SITE_OTHER)
Admission: RE | Admit: 2015-04-30 | Discharge: 2015-04-30 | Disposition: A | Discharge status: Home / Self Care | Payer: Medicare Other | Source: Ambulatory Visit | Attending: Critical Care Medicine | Admitting: Critical Care Medicine

## 2015-04-30 ENCOUNTER — Encounter: Payer: Self-pay | Admitting: Critical Care Medicine

## 2015-04-30 ENCOUNTER — Ambulatory Visit (INDEPENDENT_AMBULATORY_CARE_PROVIDER_SITE_OTHER): Payer: Medicare Other | Admitting: Critical Care Medicine

## 2015-04-30 VITALS — BP 102/78 | HR 72 | Temp 97.4°F | Ht 63.0 in | Wt 146.0 lb

## 2015-04-30 DIAGNOSIS — Z85118 Personal history of other malignant neoplasm of bronchus and lung: Secondary | ICD-10-CM

## 2015-04-30 DIAGNOSIS — J449 Chronic obstructive pulmonary disease, unspecified: Secondary | ICD-10-CM | POA: Diagnosis not present

## 2015-04-30 DIAGNOSIS — J439 Emphysema, unspecified: Secondary | ICD-10-CM | POA: Diagnosis not present

## 2015-04-30 DIAGNOSIS — I700 Atherosclerosis of aorta: Secondary | ICD-10-CM | POA: Diagnosis not present

## 2015-04-30 MED ORDER — ALBUTEROL SULFATE (2.5 MG/3ML) 0.083% IN NEBU: 2.5000 mg | INHALATION_SOLUTION | Freq: Four times a day (QID) | RESPIRATORY_TRACT | Status: DC | PRN

## 2015-04-30 MED ORDER — ARFORMOTEROL TARTRATE 15 MCG/2ML IN NEBU: 15.0000 ug | INHALATION_SOLUTION | Freq: Two times a day (BID) | RESPIRATORY_TRACT | Status: DC

## 2015-04-30 MED ORDER — ALBUTEROL SULFATE HFA 108 (90 BASE) MCG/ACT IN AERS: 2.0000 | INHALATION_SPRAY | Freq: Four times a day (QID) | RESPIRATORY_TRACT | Status: DC | PRN

## 2015-04-30 MED ORDER — BECLOMETHASONE DIPROPIONATE 40 MCG/ACT IN AERS: 2.0000 | INHALATION_SPRAY | Freq: Two times a day (BID) | RESPIRATORY_TRACT | Status: DC

## 2015-04-30 MED ORDER — TIOTROPIUM BROMIDE MONOHYDRATE 18 MCG IN CAPS: 18.0000 ug | ORAL_CAPSULE | Freq: Every day | RESPIRATORY_TRACT | Status: DC

## 2015-04-30 NOTE — Progress Notes (Signed)
Subjective:    Patient ID: [name], female    DOB: [birth year], 69 y.o.   MRN: [id number]  HPI 04/30/2015 Chief Complaint  Patient presents with  . Follow-up    heat and humidity causing breathing issues; no concerns.  No changes in past year.  Notes daily cough, notes some wheeze,  Dyspnea the same.   Pt denies any significant sore throat, nasal congestion or excess secretions, fever, chills, sweats, unintended weight loss, pleurtic or exertional chest pain, orthopnea PND, or leg swelling Pt denies any increase in rescue therapy over baseline, denies waking up needing it or having any early am or nocturnal exacerbations of coughing/wheezing/or dyspnea. Pt also denies any obvious fluctuation in symptoms with  weather or environmental change or other alleviating or aggravating factors   Current Medications, Allergies, Complete Past Medical History, Past Surgical History, Family History, and Social History were reviewed in [facility] record per todays encounter:  04/30/2015   Review of Systems  Constitutional: Positive for fatigue.  HENT: Negative.  Negative for ear pain, postnasal drip, rhinorrhea, sinus pressure, sore throat, trouble swallowing and voice change.   Eyes: Negative.   Respiratory: Positive for cough, chest tightness and wheezing. Negative for apnea, choking, shortness of breath and stridor.   Cardiovascular: Negative.  Negative for chest pain, palpitations and leg swelling.  Gastrointestinal: Negative.  Negative for nausea, vomiting, abdominal pain and abdominal distention.  Genitourinary: Negative.   Musculoskeletal: Negative.  Negative for myalgias and arthralgias.  Skin: Negative.  Negative for rash.  Allergic/Immunologic: Negative.  Negative for environmental allergies and food allergies.  Neurological: Negative.  Negative for dizziness, syncope, weakness and headaches.  Hematological: Negative.  Negative for adenopathy. Does not  bruise/bleed easily.  Psychiatric/Behavioral: Negative.  Negative for sleep disturbance and agitation. The patient is not nervous/anxious.        Objective:   Physical Exam Filed Vitals:   04/30/15 1049  BP: 102/78  Pulse: 72  Temp: 97.4 F (36.3 C)  TempSrc: Oral  Height: '5\' 3"'$  (1.6 m)  Weight: 146 lb (66.225 kg)  SpO2: 98%    Gen: Pleasant, well-nourished, in no distress,  normal affect  ENT: No lesions,  mouth clear,  oropharynx clear, no postnasal drip  Neck: No JVD, no TMG, no carotid bruits  Lungs: No use of accessory muscles, no dullness to percussion, distant BS  Cardiovascular: RRR, heart sounds normal, no murmur or gallops, no peripheral edema  Abdomen: soft and NT, no HSM,  BS normal  Musculoskeletal: No deformities, no cyanosis or clubbing  Neuro: alert, non focal  Skin: Warm, no lesions or rashes  CT Chest reviewed: Ct Chest Wo Contrast  04/30/2015   CLINICAL DATA:  History of lung cancer involving the left lung. Routine annual surveillance.  EXAM: CT CHEST WITHOUT CONTRAST  TECHNIQUE: Multidetector CT imaging of the chest was performed following the standard protocol without IV contrast.  COMPARISON:  04/26/2014  FINDINGS: Mediastinum: The heart size appears normal. There is aortic atherosclerosis. Calcification within the RCA and LAD Coronary artery noted. 7 mm left pre-vascular lymph node is identified, image 10/series 2. Previously 9 mm. Left paratracheal lymph node measures 6 mm, image number 11/series 2. The sub- carinal lymph node measures 8 mm, image 26/series 2.  Lungs/Pleura: There are postoperative changes in volume loss status post left upper lobectomy. Similar appearance of pleural thickening and loculated fluid overlying the left upper lung. Mild changes of centrilobular emphysema. Chronic atelectasis involving the right middle  lobe is unchanged from previous exam, image 33/series 3. Right upper lobe pulmonary nodule measures 4 mm and is unchanged  from previous exam. Right middle lobe pulmonary nodule is unchanged measuring 5 mm, image 31/series 3.  Upper Abdomen: No suspicious liver abnormality. Low density structure within the inferior portion of the spleen measures 1.5 cm, image 61 of series 2. The adrenal glands appear normal. The visualized portions of the pancreas and kidneys are normal.  Musculoskeletal: No aggressive lytic or sclerotic bone lesions identified.  I  IMPRESSION: 1. No significant change when compared with previous exam. There are no specific features identified to suggest residual or recurrence of tumor. 2. Stable sub cm mediastinal lymph nodes. 3. Small right lung nodules are unchanged. 4. Aortic atherosclerosis 5. Emphysema.   Electronically Signed   By: Kerby Moors M.D.   On: 04/30/2015 12:16          Assessment & Plan:  I personally reviewed all images and lab data in the [facility] system as well as any outside material available during this office visit and agree with the  radiology impressions.   COPD with chronic bronchitis Gold stage C COPD with chronic bronchitis stable at this time on inhaled medications No need for additional treatment change Maintain inhaled medications as prescribed  History of lung cancer History of lung cancer status post resection 2005 with non-small cell carcinoma associated upper lobe resection and is radiation seed implantation Follow-up CT scan on this visit shows no recurrent disease The patient should receive a another CT of the chest in one year's time   Annya was seen today for follow-up.  Diagnoses and all orders for this visit:  History of lung cancer Orders: -     CT Chest Wo Contrast; Future  COPD with chronic bronchitis  Other orders -     Discontinue: albuterol (PROVENTIL HFA) 108 (90 BASE) MCG/ACT inhaler; Inhale 2 puffs into the lungs 4 (four) times daily as needed. Shortness of breath -     Discontinue: albuterol (PROVENTIL) (2.5 MG/3ML) 0.083% nebulizer  solution; Take 3 mLs (2.5 mg total) by nebulization every 6 (six) hours as needed. -     arformoterol (BROVANA) 15 MCG/2ML NEBU; Take 2 mLs (15 mcg total) by nebulization 2 (two) times daily. -     tiotropium (SPIRIVA) 18 MCG inhalation capsule; Place 1 capsule (18 mcg total) into inhaler and inhale daily. -     beclomethasone (QVAR) 40 MCG/ACT inhaler; Inhale 2 puffs into the lungs 2 (two) times daily. -     albuterol (PROVENTIL HFA) 108 (90 BASE) MCG/ACT inhaler; Inhale 2 puffs into the lungs 4 (four) times daily as needed. Shortness of breath -     albuterol (PROVENTIL) (2.5 MG/3ML) 0.083% nebulizer solution; Take 3 mLs (2.5 mg total) by nebulization every 6 (six) hours as needed.

## 2015-04-30 NOTE — Patient Instructions (Signed)
Refills made on all medications A CT Chest will be obtained Return 1 year to Dr Elsworth Soho at [facility]

## 2015-05-01 NOTE — Assessment & Plan Note (Signed)
Gold stage C COPD with chronic bronchitis stable at this time on inhaled medications No need for additional treatment change Maintain inhaled medications as prescribed

## 2015-05-01 NOTE — Assessment & Plan Note (Signed)
History of lung cancer status post resection 2005 with non-small cell carcinoma associated upper lobe resection and is radiation seed implantation Follow-up CT scan on this visit shows no recurrent disease The patient should receive a another CT of the chest in one year's time

## 2015-05-14 ENCOUNTER — Encounter: Payer: Self-pay | Admitting: *Deleted

## 2015-05-15 ENCOUNTER — Encounter: Payer: Self-pay | Admitting: *Deleted

## 2015-05-21 DIAGNOSIS — I10 Essential (primary) hypertension: Secondary | ICD-10-CM | POA: Diagnosis not present

## 2015-05-21 DIAGNOSIS — E785 Hyperlipidemia, unspecified: Secondary | ICD-10-CM | POA: Diagnosis not present

## 2015-05-22 LAB — COMPLETE METABOLIC PANEL WITH GFR
ALT: 12 U/L (ref 6–29)
AST: 19 U/L (ref 10–35)
Albumin: 3.7 g/dL (ref 3.6–5.1)
Alkaline Phosphatase: 91 U/L (ref 33–130)
BUN: 12 mg/dL (ref 7–25)
CO2: 27 mmol/L (ref 20–31)
Calcium: 8.9 mg/dL (ref 8.6–10.4)
Chloride: 100 mmol/L (ref 98–110)
Creat: 0.6 mg/dL (ref 0.50–0.99)
GFR, Est African American: >89 mL/min (ref >=60)
GFR, Est Non African American: >89 mL/min (ref >=60)
Glucose, Bld: 92 mg/dL (ref 65–99)
Potassium: 3.9 mmol/L (ref 3.5–5.3)
Sodium: 141 mmol/L (ref 135–146)
Total Bilirubin: 0.5 mg/dL (ref 0.2–1.2)
Total Protein: 6.2 g/dL (ref 6.1–8.1)

## 2015-05-22 LAB — LIPID PANEL
Cholesterol: 143 mg/dL (ref 125–200)
HDL: 76 mg/dL (ref >=46)
LDL Cholesterol: 53 mg/dL (ref <130)
Total CHOL/HDL Ratio: 1.9 Ratio (ref <=5.0)
Triglycerides: 72 mg/dL (ref <150)
VLDL: 14 mg/dL (ref <30)

## 2015-07-05 ENCOUNTER — Other Ambulatory Visit: Payer: Self-pay | Admitting: Critical Care Medicine

## 2015-07-05 DIAGNOSIS — Z85118 Personal history of other malignant neoplasm of bronchus and lung: Secondary | ICD-10-CM

## 2015-08-06 ENCOUNTER — Ambulatory Visit (INDEPENDENT_AMBULATORY_CARE_PROVIDER_SITE_OTHER): Payer: Medicare Other | Admitting: Physician Assistant

## 2015-08-06 VITALS — Temp 98.7°F

## 2015-08-06 DIAGNOSIS — Z23 Encounter for immunization: Secondary | ICD-10-CM | POA: Diagnosis not present

## 2015-10-12 ENCOUNTER — Ambulatory Visit (INDEPENDENT_AMBULATORY_CARE_PROVIDER_SITE_OTHER): Payer: Medicare Other | Admitting: Physician Assistant

## 2015-10-12 ENCOUNTER — Encounter: Payer: Self-pay | Admitting: Physician Assistant

## 2015-10-12 VITALS — BP 161/59 | HR 83 | Ht 63.0 in | Wt 145.0 lb

## 2015-10-12 DIAGNOSIS — IMO0001 Reserved for inherently not codable concepts without codable children: Secondary | ICD-10-CM

## 2015-10-12 DIAGNOSIS — R012 Other cardiac sounds: Secondary | ICD-10-CM

## 2015-10-12 DIAGNOSIS — R7301 Impaired fasting glucose: Secondary | ICD-10-CM

## 2015-10-12 DIAGNOSIS — R030 Elevated blood-pressure reading, without diagnosis of hypertension: Secondary | ICD-10-CM | POA: Diagnosis not present

## 2015-10-12 LAB — POCT GLYCOSYLATED HEMOGLOBIN (HGB A1C): Hemoglobin A1C: 5.8

## 2015-10-12 MED ORDER — ATORVASTATIN CALCIUM 20 MG PO TABS: 20.0000 mg | ORAL_TABLET | Freq: Every day | ORAL | Status: DC

## 2015-10-12 MED ORDER — AMLODIPINE BESYLATE 5 MG PO TABS: 5.0000 mg | ORAL_TABLET | Freq: Every day | ORAL | Status: DC

## 2015-10-12 MED ORDER — ALPRAZOLAM 1 MG PO TABS: ORAL_TABLET | ORAL | Status: DC

## 2015-10-12 MED ORDER — LEVOTHYROXINE SODIUM 50 MCG PO TABS: ORAL_TABLET | ORAL | Status: DC

## 2015-10-12 MED ORDER — CLOPIDOGREL BISULFATE 75 MG PO TABS: 75.0000 mg | ORAL_TABLET | Freq: Every day | ORAL | Status: DC

## 2015-10-12 NOTE — Patient Instructions (Signed)
Will echo.

## 2015-10-16 DIAGNOSIS — R012 Other cardiac sounds: Secondary | ICD-10-CM | POA: Insufficient documentation

## 2015-10-16 DIAGNOSIS — R030 Elevated blood-pressure reading, without diagnosis of hypertension: Secondary | ICD-10-CM

## 2015-10-16 DIAGNOSIS — IMO0001 Reserved for inherently not codable concepts without codable children: Secondary | ICD-10-CM | POA: Insufficient documentation

## 2015-10-16 NOTE — Progress Notes (Signed)
   Subjective:    Patient ID: [name], female    DOB: 1945-11-15, 69 y.o.   MRN: [id number]  HPI Pt presents to the clinic for follow up. She has IFG and periodically gets A!C checked. She is not on any medications for this. She denies any problems or concerns.   BP elevated today. She denies this being a problem but reports a "stressful morning". She denies any CP, palpitations, headaches or dizziness. She has been a little more SOB over past few weeks. She has dx of COPD but controlled on medications. Denies any lower extremity swelling.    Review of Systems See HPI.     Objective:   Physical Exam  Constitutional: She is oriented to person, place, and time. She appears well-developed and well-nourished.  HENT:  Head: Normocephalic and atraumatic.  Neck: Normal range of motion. Neck supple. No JVD present. No tracheal deviation present.  Cardiovascular: Normal rate.   Split s2 on exam today.   Pulmonary/Chest: Effort normal and breath sounds normal. She has no wheezes.  Neurological: She is alert and oriented to person, place, and time.  Skin:  No pedal edema.   Psychiatric: She has a normal mood and affect. Her behavior is normal.          Assessment & Plan:  IFG- .. Lab Results  Component Value Date   HGBA1C 5.8 10/12/2015   A!c great at 5.8 today. Pre-diabetes but good. Watch diet to limit sugars and carbs. Exercise. Follow up in 1 years.   Elevated BP- on norvasc for PVD but BP not usually elevated. Will recheck in office in one week. Limit salt intake. Follow up with any HA, dizziness, CP, or weakness.   Spilt S2- likely physiologic but since new would like to get repeat echo. Pt declines this. Elevated BP could make exaggerated. Pt request wait to order until repeat BP. Agreed but she is aware of risk. No lower extermity edema on exam today which is reassuring.

## 2015-10-19 ENCOUNTER — Ambulatory Visit (INDEPENDENT_AMBULATORY_CARE_PROVIDER_SITE_OTHER): Payer: Medicare Other | Admitting: Physician Assistant

## 2015-10-19 ENCOUNTER — Encounter: Payer: Self-pay | Admitting: Physician Assistant

## 2015-10-19 ENCOUNTER — Ambulatory Visit: Payer: Medicare Other | Admitting: Sports Medicine

## 2015-10-19 ENCOUNTER — Ambulatory Visit (INDEPENDENT_AMBULATORY_CARE_PROVIDER_SITE_OTHER): Payer: Medicare Other

## 2015-10-19 VITALS — BP 158/58 | HR 107 | Temp 98.9°F | Ht 63.0 in | Wt 143.0 lb

## 2015-10-19 DIAGNOSIS — R0602 Shortness of breath: Secondary | ICD-10-CM | POA: Diagnosis not present

## 2015-10-19 DIAGNOSIS — J441 Chronic obstructive pulmonary disease with (acute) exacerbation: Secondary | ICD-10-CM

## 2015-10-19 DIAGNOSIS — R05 Cough: Secondary | ICD-10-CM | POA: Diagnosis not present

## 2015-10-19 DIAGNOSIS — R059 Cough, unspecified: Secondary | ICD-10-CM

## 2015-10-19 MED ORDER — IPRATROPIUM-ALBUTEROL 0.5-2.5 (3) MG/3ML IN SOLN
3.0000 mL | Freq: Once | RESPIRATORY_TRACT | Status: AC
  Administered 2015-10-19: 3 mL via RESPIRATORY_TRACT

## 2015-10-19 MED ORDER — METHYLPREDNISOLONE SODIUM SUCC 125 MG IJ SOLR
125.0000 mg | Freq: Once | INTRAMUSCULAR | Status: AC
  Administered 2015-10-19: 125 mg via INTRAMUSCULAR

## 2015-10-19 MED ORDER — PREDNISONE 20 MG PO TABS: ORAL_TABLET | ORAL | Status: DC

## 2015-10-19 MED ORDER — DOXYCYCLINE HYCLATE 100 MG PO TABS: 100.0000 mg | ORAL_TABLET | Freq: Two times a day (BID) | ORAL | Status: DC

## 2015-10-19 MED ORDER — IPRATROPIUM-ALBUTEROL 0.5-2.5 (3) MG/3ML IN SOLN: 3.0000 mL | Freq: Four times a day (QID) | RESPIRATORY_TRACT | Status: DC

## 2015-10-19 NOTE — Progress Notes (Signed)
   Subjective:    Patient ID: [name], female    DOB: 08/22/1946, 69 y.o.   MRN: [id number]  HPI  Pt is a 69 yo female with COPD, hx of lung cancer and resection that has had 5 days of SOB, cough, congestion. Taking dayquil and not improving. SOB started yesterday. Temperature of 100 to 101. Has had some body aches. Very tired. Cough is productive. Taking preventative inhalers daily with rescue not 3 times a day. Lots of sick exposures over the weekend and at [place].      Review of Systems  All other systems reviewed and are negative.      Objective:   Physical Exam  Constitutional: She is oriented to person, place, and time. She appears well-developed and well-nourished.  HENT:  Head: Normocephalic and atraumatic.  Right Ear: External ear normal.  Left Ear: External ear normal.  Nose: Nose normal.  Mouth/Throat: Oropharynx is clear and moist. No oropharyngeal exudate.  TM's clear bilaterally.  Negative for maxillary or sinus tenderness to palpation.   Eyes: Conjunctivae are normal. Right eye exhibits no discharge. Left eye exhibits no discharge.  Neck: Normal range of motion. Neck supple.  Cardiovascular: Regular rhythm.   Tachycardia at 107. Continue to hear physicological split of S2  Pulmonary/Chest:  Pulse ox 91 room air improved to 94 with nebulizer.  No rhonchi. Wheezing heard over right lung.  Decreased breath sounds.   Lymphadenopathy:    She has no cervical adenopathy.  Neurological: She is alert and oriented to person, place, and time.  Psychiatric: She has a normal mood and affect. Her behavior is normal.          Assessment & Plan:  COPD exacerbation/cough- CXR confirmed no pneumonia. Will treat with prednisone taper and doxycycline. Continue all preventative  inhalers. Pulse ox increased from 91 percent to 94 percent with duoneb. Discussed to continue albuterol nebulizer every 2-4 hours as needed for next 3 days. Shot of solumedrol '125mg'$  IM given  today. Follow up on Tuesday if not improved or ER over weekend if SOB not improving or worsening.

## 2015-12-04 ENCOUNTER — Ambulatory Visit (INDEPENDENT_AMBULATORY_CARE_PROVIDER_SITE_OTHER): Payer: Medicare Other | Admitting: Physician Assistant

## 2015-12-04 ENCOUNTER — Encounter: Payer: Self-pay | Admitting: Physician Assistant

## 2015-12-04 VITALS — BP 182/68 | HR 87 | Ht 63.0 in | Wt 144.0 lb

## 2015-12-04 DIAGNOSIS — M62830 Muscle spasm of back: Secondary | ICD-10-CM

## 2015-12-04 DIAGNOSIS — F419 Anxiety disorder, unspecified: Secondary | ICD-10-CM

## 2015-12-04 DIAGNOSIS — F411 Generalized anxiety disorder: Secondary | ICD-10-CM

## 2015-12-04 DIAGNOSIS — R413 Other amnesia: Secondary | ICD-10-CM

## 2015-12-04 DIAGNOSIS — F430 Acute stress reaction: Secondary | ICD-10-CM

## 2015-12-04 LAB — SEDIMENTATION RATE: Sed Rate: 4 mm/hr (ref 0–30)

## 2015-12-04 LAB — CBC
HCT: 39.4 % (ref 36.0–46.0)
Hemoglobin: 13.2 g/dL (ref 12.0–15.0)
MCH: 29.4 pg (ref 26.0–34.0)
MCHC: 33.5 g/dL (ref 30.0–36.0)
MCV: 87.8 fL (ref 78.0–100.0)
MPV: 10.2 fL (ref 8.6–12.4)
Platelets: 300 10*3/uL (ref 150–400)
RBC: 4.49 MIL/uL (ref 3.87–5.11)
RDW: 14.1 % (ref 11.5–15.5)
WBC: 8.1 10*3/uL (ref 4.0–10.5)

## 2015-12-04 MED ORDER — KETOROLAC TROMETHAMINE 30 MG/ML IJ SOLN
30.0000 mg | Freq: Once | INTRAMUSCULAR | Status: AC
  Administered 2015-12-04: 30 mg via INTRAMUSCULAR

## 2015-12-04 MED ORDER — OXYCODONE-ACETAMINOPHEN 5-325 MG PO TABS: 1.0000 | ORAL_TABLET | Freq: Three times a day (TID) | ORAL | Status: DC | PRN

## 2015-12-04 MED ORDER — MELOXICAM 15 MG PO TABS: 15.0000 mg | ORAL_TABLET | Freq: Every day | ORAL | Status: DC

## 2015-12-04 MED ORDER — CYCLOBENZAPRINE HCL 10 MG PO TABS: 10.0000 mg | ORAL_TABLET | Freq: Three times a day (TID) | ORAL | Status: DC | PRN

## 2015-12-04 NOTE — Progress Notes (Signed)
Subjective:    Patient ID: [name], female    DOB: [birth year], 70 y.o.   MRN: [id number]  HPI  Pt is a 70 yo female who presents to the clinic with husband. She has 2 main concerns today. Her lower back pain and memory changes.   Her low back pain started off and on 3 weeks ago and progressed to more pain. No known injury. No falls. She was coughing a lot for last couple of weeks and seemed to make worse. Pain is worse with movement. Pain does not radiate into either leg. Pain and tightness located in low back and buttocks. Pain is better with leaning back and worse with walking and leaning forward. Taking aleve with little relief. Pain was 8/10 last night and took one of her husbands oxycodone which help her sleep last night.   She also feels like her memory has gotten worse. She forgot her grandaughter at school yesterday. That is something she does every day. She feels like she forgets things people tell her frequently. She is under a lot of stress. Her daughter and grandson is living with them. She is very aggressive and non of the family wants to be around her. She calls the patient names and grits her teeth at her telling her what a bad mother she is. The law has come out 10-15 times because she has gotten aggressive.     Review of Systems  All other systems reviewed and are negative.      Objective:   Physical Exam  Constitutional: She is oriented to person, place, and time. She appears well-developed and well-nourished.  HENT:  Head: Normocephalic and atraumatic.  Right Ear: External ear normal.  Left Ear: External ear normal.  Nose: Nose normal.  Mouth/Throat: Oropharynx is clear and moist. No oropharyngeal exudate.  Eyes: Conjunctivae are normal. Right eye exhibits no discharge. Left eye exhibits no discharge.  Neck: Normal range of motion. Neck supple.  Cardiovascular: Normal rate, regular rhythm and normal heart sounds.   Pulmonary/Chest: Effort normal and breath  sounds normal. She has no wheezes.  Musculoskeletal:  Decreased ROM at waist due to pain. Not able to forward flex. Lateral side to side decreased due to low back pain.  Negative bilateral straight leg test.  Patellar reflexes 2+, bilateral.  No tenderness over lumbar spine to palpation.  Tightness/tenderness over bilateral Paraspinous muscles and into buttocks to palpation.  Normal ROm at bilateral hips.   Lymphadenopathy:    She has no cervical adenopathy.  Neurological: She is alert and oriented to person, place, and time. She has normal reflexes. No cranial nerve deficit. Coordination normal.  Skin: Skin is dry.  Psychiatric: She has a normal mood and affect. Her behavior is normal.          Assessment & Plan:  Memory changes-MMSE was 28/30 today. No abnormal neurological findings on exam today. Will order a dementia panel. The more I talk with patient the more I realize how much stress she is under. I feel like some of her memory changes could be related to anxiety and stress. See below discussion. She does have hx of TIA's. Will get CT of brain to rule out vascular dementia. I would like to work on anxiety first; discussed aricept. Pt declined both today.   Anxiety due to exceptional stress- GAD-7 was 21. She has xanax to use as needed which helps. Discussed a daily anti-anxiety. Pt declines today. Discussed counseling and how might be a  good idea for her daughter to look for another home.   Muscle spasms of back- toradol '30mg'$  IM given in office today. mobic to start tomorrow. Flexeril as needed. Sedation warning discussed. Exercises given for patient to start. Encouraged heat, biofreeze and oxycodone as needed for acute pain. Follow up if not improving.

## 2015-12-04 NOTE — Patient Instructions (Addendum)
Will get CT.   Low Back Strain With Rehab A strain is an injury in which a tendon or muscle is torn. The muscles and tendons of the lower back are vulnerable to strains. However, these muscles and tendons are very strong and require a great force to be injured. Strains are classified into three categories. Grade 1 strains cause pain, but the tendon is not lengthened. Grade 2 strains include a lengthened ligament, due to the ligament being stretched or partially ruptured. With grade 2 strains there is still function, although the function may be decreased. Grade 3 strains involve a complete tear of the tendon or muscle, and function is usually impaired. SYMPTOMS   Pain in the lower back.  Pain that affects one side more than the other.  Pain that gets worse with movement and may be felt in the hip, buttocks, or back of the thigh.  Muscle spasms of the muscles in the back.  Swelling along the muscles of the back.  Loss of strength of the back muscles.  Crackling sound (crepitation) when the muscles are touched. CAUSES  Lower back strains occur when a force is placed on the muscles or tendons that is greater than they can handle. Common causes of injury include:  Prolonged overuse of the muscle-tendon units in the lower back, usually from incorrect posture.  A single violent injury or force applied to the back. RISK INCREASES WITH:  Sports that involve twisting forces on the spine or a lot of bending at the waist (football, rugby, weightlifting, bowling, golf, tennis, speed skating, racquetball, swimming, running, gymnastics, diving).  Poor strength and flexibility.  Failure to warm up properly before activity.  Family history of lower back pain or disk disorders.  Previous back injury or surgery (especially fusion).  Poor posture with lifting, especially heavy objects.  Prolonged sitting, especially with poor posture. PREVENTION   Learn and use proper posture when sitting or  lifting (maintain proper posture when sitting, lift using the knees and legs, not at the waist).  Warm up and stretch properly before activity.  Allow for adequate recovery between workouts.  Maintain physical fitness:  Strength, flexibility, and endurance.  Cardiovascular fitness. PROGNOSIS  If treated properly, lower back strains usually heal within 6 weeks. RELATED COMPLICATIONS   Recurring symptoms, resulting in a chronic problem.  Chronic inflammation, scarring, and partial muscle-tendon tear.  Delayed healing or resolution of symptoms.  Prolonged disability. TREATMENT  Treatment first involves the use of ice and medicine, to reduce pain and inflammation. The use of strengthening and stretching exercises may help reduce pain with activity. These exercises may be performed at home or with a therapist. Severe injuries may require referral to a therapist for further evaluation and treatment, such as ultrasound. Your caregiver may advise that you wear a back brace or corset, to help reduce pain and discomfort. Often, prolonged bed rest results in greater harm then benefit. Corticosteroid injections may be recommended. However, these should be reserved for the most serious cases. It is important to avoid using your back when lifting objects. At night, sleep on your back on a firm mattress with a pillow placed under your knees. If non-surgical treatment is unsuccessful, surgery may be needed.  MEDICATION   If pain medicine is needed, nonsteroidal anti-inflammatory medicines (aspirin and ibuprofen), or other minor pain relievers (acetaminophen), are often advised.  Do not take pain medicine for 7 days before surgery.  Prescription pain relievers may be given, if your caregiver thinks they  are needed. Use only as directed and only as much as you need.  Ointments applied to the skin may be helpful.  Corticosteroid injections may be given by your caregiver. These injections should be  reserved for the most serious cases, because they may only be given a certain number of times. HEAT AND COLD  Cold treatment (icing) should be applied for 10 to 15 minutes every 2 to 3 hours for inflammation and pain, and immediately after activity that aggravates your symptoms. Use ice packs or an ice massage.  Heat treatment may be used before performing stretching and strengthening activities prescribed by your caregiver, physical therapist, or athletic trainer. Use a heat pack or a warm water soak. SEEK MEDICAL CARE IF:   Symptoms get worse or do not improve in 2 to 4 weeks, despite treatment.  You develop numbness, weakness, or loss of bowel or bladder function.  New, unexplained symptoms develop. (Drugs used in treatment may produce side effects.) EXERCISES  RANGE OF MOTION (ROM) AND STRETCHING EXERCISES - Low Back Strain Most people with lower back pain will find that their symptoms get worse with excessive bending forward (flexion) or arching at the lower back (extension). The exercises which will help resolve your symptoms will focus on the opposite motion.  Your physician, physical therapist or athletic trainer will help you determine which exercises will be most helpful to resolve your lower back pain. Do not complete any exercises without first consulting with your caregiver. Discontinue any exercises which make your symptoms worse until you speak to your caregiver.  If you have pain, numbness or tingling which travels down into your buttocks, leg or foot, the goal of the therapy is for these symptoms to move closer to your back and eventually resolve. Sometimes, these leg symptoms will get better, but your lower back pain may worsen. This is typically an indication of progress in your rehabilitation. Be very alert to any changes in your symptoms and the activities in which you participated in the 24 hours prior to the change. Sharing this information with your caregiver will allow  him/her to most efficiently treat your condition.  These exercises may help you when beginning to rehabilitate your injury. Your symptoms may resolve with or without further involvement from your physician, physical therapist or athletic trainer. While completing these exercises, remember:  Restoring tissue flexibility helps normal motion to return to the joints. This allows healthier, less painful movement and activity.  An effective stretch should be held for at least 30 seconds.  A stretch should never be painful. You should only feel a gentle lengthening or release in the stretched tissue. FLEXION RANGE OF MOTION AND STRETCHING EXERCISES: STRETCH - Flexion, Single Knee to Chest   Lie on a firm bed or floor with both legs extended in front of you.  Keeping one leg in contact with the floor, bring your opposite knee to your chest. Hold your leg in place by either grabbing behind your thigh or at your knee.  Pull until you feel a gentle stretch in your lower back. Hold __________ seconds.  Slowly release your grasp and repeat the exercise with the opposite side. Repeat __________ times. Complete this exercise __________ times per day.  STRETCH - Flexion, Double Knee to Chest   Lie on a firm bed or floor with both legs extended in front of you.  Keeping one leg in contact with the floor, bring your opposite knee to your chest.  Tense your stomach muscles to  support your back and then lift your other knee to your chest. Hold your legs in place by either grabbing behind your thighs or at your knees.  Pull both knees toward your chest until you feel a gentle stretch in your lower back. Hold __________ seconds.  Tense your stomach muscles and slowly return one leg at a time to the floor. Repeat __________ times. Complete this exercise __________ times per day.  STRETCH - Low Trunk Rotation  Lie on a firm bed or floor. Keeping your legs in front of you, bend your knees so they are both  pointed toward the ceiling and your feet are flat on the floor.  Extend your arms out to the side. This will stabilize your upper body by keeping your shoulders in contact with the floor.  Gently and slowly drop both knees together to one side until you feel a gentle stretch in your lower back. Hold for __________ seconds.  Tense your stomach muscles to support your lower back as you bring your knees back to the starting position. Repeat the exercise to the other side. Repeat __________ times. Complete this exercise __________ times per day  EXTENSION RANGE OF MOTION AND FLEXIBILITY EXERCISES: STRETCH - Extension, Prone on Elbows   Lie on your stomach on the floor, a bed will be too soft. Place your palms about shoulder width apart and at the height of your head.  Place your elbows under your shoulders. If this is too painful, stack pillows under your chest.  Allow your body to relax so that your hips drop lower and make contact more completely with the floor.  Hold this position for __________ seconds.  Slowly return to lying flat on the floor. Repeat __________ times. Complete this exercise __________ times per day.  RANGE OF MOTION - Extension, Prone Press Ups  Lie on your stomach on the floor, a bed will be too soft. Place your palms about shoulder width apart and at the height of your head.  Keeping your back as relaxed as possible, slowly straighten your elbows while keeping your hips on the floor. You may adjust the placement of your hands to maximize your comfort. As you gain motion, your hands will come more underneath your shoulders.  Hold this position __________ seconds.  Slowly return to lying flat on the floor. Repeat __________ times. Complete this exercise __________ times per day.  RANGE OF MOTION- Quadruped, Neutral Spine   Assume a hands and knees position on a firm surface. Keep your hands under your shoulders and your knees under your hips. You may place padding  under your knees for comfort.  Drop your head and point your tail bone toward the ground below you. This will round out your lower back like an angry cat. Hold this position for __________ seconds.  Slowly lift your head and release your tail bone so that your back sags into a large arch, like an old horse.  Hold this position for __________ seconds.  Repeat this until you feel limber in your lower back.  Now, find your "sweet spot." This will be the most comfortable position somewhere between the two previous positions. This is your neutral spine. Once you have found this position, tense your stomach muscles to support your lower back.  Hold this position for __________ seconds. Repeat __________ times. Complete this exercise __________ times per day.  STRENGTHENING EXERCISES - Low Back Strain These exercises may help you when beginning to rehabilitate your injury. These exercises should  be done near your "sweet spot." This is the neutral, low-back arch, somewhere between fully rounded and fully arched, that is your least painful position. When performed in this safe range of motion, these exercises can be used for people who have either a flexion or extension based injury. These exercises may resolve your symptoms with or without further involvement from your physician, physical therapist or athletic trainer. While completing these exercises, remember:   Muscles can gain both the endurance and the strength needed for everyday activities through controlled exercises.  Complete these exercises as instructed by your physician, physical therapist or athletic trainer. Increase the resistance and repetitions only as guided.  You may experience muscle soreness or fatigue, but the pain or discomfort you are trying to eliminate should never worsen during these exercises. If this pain does worsen, stop and make certain you are following the directions exactly. If the pain is still present after  adjustments, discontinue the exercise until you can discuss the trouble with your caregiver. STRENGTHENING - Deep Abdominals, Pelvic Tilt  Lie on a firm bed or floor. Keeping your legs in front of you, bend your knees so they are both pointed toward the ceiling and your feet are flat on the floor.  Tense your lower abdominal muscles to press your lower back into the floor. This motion will rotate your pelvis so that your tail bone is scooping upwards rather than pointing at your feet or into the floor.  With a gentle tension and even breathing, hold this position for __________ seconds. Repeat __________ times. Complete this exercise __________ times per day.  STRENGTHENING - Abdominals, Crunches   Lie on a firm bed or floor. Keeping your legs in front of you, bend your knees so they are both pointed toward the ceiling and your feet are flat on the floor. Cross your arms over your chest.  Slightly tip your chin down without bending your neck.  Tense your abdominals and slowly lift your trunk high enough to just clear your shoulder blades. Lifting higher can put excessive stress on the lower back and does not further strengthen your abdominal muscles.  Control your return to the starting position. Repeat __________ times. Complete this exercise __________ times per day.  STRENGTHENING - Quadruped, Opposite UE/LE Lift   Assume a hands and knees position on a firm surface. Keep your hands under your shoulders and your knees under your hips. You may place padding under your knees for comfort.  Find your neutral spine and gently tense your abdominal muscles so that you can maintain this position. Your shoulders and hips should form a rectangle that is parallel with the floor and is not twisted.  Keeping your trunk steady, lift your right hand no higher than your shoulder and then your left leg no higher than your hip. Make sure you are not holding your breath. Hold this position __________  seconds.  Continuing to keep your abdominal muscles tense and your back steady, slowly return to your starting position. Repeat with the opposite arm and leg. Repeat __________ times. Complete this exercise __________ times per day.  STRENGTHENING - Lower Abdominals, Double Knee Lift  Lie on a firm bed or floor. Keeping your legs in front of you, bend your knees so they are both pointed toward the ceiling and your feet are flat on the floor.  Tense your abdominal muscles to brace your lower back and slowly lift both of your knees until they come over your hips. Be certain  not to hold your breath.  Hold __________ seconds. Using your abdominal muscles, return to the starting position in a slow and controlled manner. Repeat __________ times. Complete this exercise __________ times per day.  POSTURE AND BODY MECHANICS CONSIDERATIONS - Low Back Strain Keeping correct posture when sitting, standing or completing your activities will reduce the stress put on different body tissues, allowing injured tissues a chance to heal and limiting painful experiences. The following are general guidelines for improved posture. Your physician or physical therapist will provide you with any instructions specific to your needs. While reading these guidelines, remember:  The exercises prescribed by your provider will help you have the flexibility and strength to maintain correct postures.  The correct posture provides the best environment for your joints to work. All of your joints have less wear and tear when properly supported by a spine with good posture. This means you will experience a healthier, less painful body.  Correct posture must be practiced with all of your activities, especially prolonged sitting and standing. Correct posture is as important when doing repetitive low-stress activities (typing) as it is when doing a single heavy-load activity (lifting). RESTING POSITIONS Consider which positions are most  painful for you when choosing a resting position. If you have pain with flexion-based activities (sitting, bending, stooping, squatting), choose a position that allows you to rest in a less flexed posture. You would want to avoid curling into a fetal position on your side. If your pain worsens with extension-based activities (prolonged standing, working overhead), avoid resting in an extended position such as sleeping on your stomach. Most people will find more comfort when they rest with their spine in a more neutral position, neither too rounded nor too arched. Lying on a non-sagging bed on your side with a pillow between your knees, or on your back with a pillow under your knees will often provide some relief. Keep in mind, being in any one position for a prolonged period of time, no matter how correct your posture, can still lead to stiffness. PROPER SITTING POSTURE In order to minimize stress and discomfort on your spine, you must sit with correct posture. Sitting with good posture should be effortless for a healthy body. Returning to good posture is a gradual process. Many people can work toward this most comfortably by using various supports until they have the flexibility and strength to maintain this posture on their own. When sitting with proper posture, your ears will fall over your shoulders and your shoulders will fall over your hips. You should use the back of the chair to support your upper back. Your lower back will be in a neutral position, just slightly arched. You may place a small pillow or folded towel at the base of your lower back for support.  When working at a desk, create an environment that supports good, upright posture. Without extra support, muscles tire, which leads to excessive strain on joints and other tissues. Keep these recommendations in mind: CHAIR:  A chair should be able to slide under your desk when your back makes contact with the back of the chair. This allows you to  work closely.  The chair's height should allow your eyes to be level with the upper part of your monitor and your hands to be slightly lower than your elbows. BODY POSITION  Your feet should make contact with the floor. If this is not possible, use a foot rest.  Keep your ears over your shoulders. This will  reduce stress on your neck and lower back. INCORRECT SITTING POSTURES  If you are feeling tired and unable to assume a healthy sitting posture, do not slouch or slump. This puts excessive strain on your back tissues, causing more damage and pain. Healthier options include:  Using more support, like a lumbar pillow.  Switching tasks to something that requires you to be upright or walking.  Talking a brief walk.  Lying down to rest in a neutral-spine position. PROLONGED STANDING WHILE SLIGHTLY LEANING FORWARD  When completing a task that requires you to lean forward while standing in one place for a long time, place either foot up on a stationary 2-4 inch high object to help maintain the best posture. When both feet are on the ground, the lower back tends to lose its slight inward curve. If this curve flattens (or becomes too large), then the back and your other joints will experience too much stress, tire more quickly, and can cause pain. CORRECT STANDING POSTURES Proper standing posture should be assumed with all daily activities, even if they only take a few moments, like when brushing your teeth. As in sitting, your ears should fall over your shoulders and your shoulders should fall over your hips. You should keep a slight tension in your abdominal muscles to brace your spine. Your tailbone should point down to the ground, not behind your body, resulting in an over-extended swayback posture.  INCORRECT STANDING POSTURES  Common incorrect standing postures include a forward head, locked knees and/or an excessive swayback. WALKING Walk with an upright posture. Your ears, shoulders and  hips should all line-up. PROLONGED ACTIVITY IN A FLEXED POSITION When completing a task that requires you to bend forward at your waist or lean over a low surface, try to find a way to stabilize 3 out of 4 of your limbs. You can place a hand or elbow on your thigh or rest a knee on the surface you are reaching across. This will provide you more stability so that your muscles do not fatigue as quickly. By keeping your knees relaxed, or slightly bent, you will also reduce stress across your lower back. CORRECT LIFTING TECHNIQUES DO :   Assume a wide stance. This will provide you more stability and the opportunity to get as close as possible to the object which you are lifting.  Tense your abdominals to brace your spine. Bend at the knees and hips. Keeping your back locked in a neutral-spine position, lift using your leg muscles. Lift with your legs, keeping your back straight.  Test the weight of unknown objects before attempting to lift them.  Try to keep your elbows locked down at your sides in order get the best strength from your shoulders when carrying an object.  Always ask for help when lifting heavy or awkward objects. INCORRECT LIFTING TECHNIQUES DO NOT:   Lock your knees when lifting, even if it is a small object.  Bend and twist. Pivot at your feet or move your feet when needing to change directions.  Assume that you can safely pick up even a paper clip without proper posture.   This information is not intended to replace advice given to you by your health care provider. Make sure you discuss any questions you have with your health care provider.   Document Released: 10/06/2005 Document Revised: 10/27/2014 Document Reviewed: 01/18/2009 Elsevier Interactive Patient Education [employer].

## 2015-12-05 ENCOUNTER — Encounter: Payer: Self-pay | Admitting: Physician Assistant

## 2015-12-05 DIAGNOSIS — F411 Generalized anxiety disorder: Secondary | ICD-10-CM

## 2015-12-05 DIAGNOSIS — M62830 Muscle spasm of back: Secondary | ICD-10-CM | POA: Insufficient documentation

## 2015-12-05 DIAGNOSIS — R413 Other amnesia: Secondary | ICD-10-CM

## 2015-12-05 DIAGNOSIS — F430 Acute stress reaction: Secondary | ICD-10-CM

## 2015-12-05 HISTORY — DX: Other amnesia: R41.3

## 2015-12-05 HISTORY — DX: Generalized anxiety disorder: F41.1

## 2015-12-05 HISTORY — DX: Muscle spasm of back: M62.830

## 2015-12-05 LAB — VITAMIN B12: Vitamin B-12: 436 pg/mL (ref 200–1100)

## 2015-12-05 LAB — FOLATE: Folate: 11.6 ng/mL (ref >5.4)

## 2015-12-05 LAB — RPR

## 2015-12-05 LAB — TSH: TSH: 2.77 mIU/L

## 2015-12-10 ENCOUNTER — Ambulatory Visit (INDEPENDENT_AMBULATORY_CARE_PROVIDER_SITE_OTHER): Payer: Medicare Other

## 2015-12-10 DIAGNOSIS — R413 Other amnesia: Secondary | ICD-10-CM | POA: Diagnosis not present

## 2015-12-10 DIAGNOSIS — R42 Dizziness and giddiness: Secondary | ICD-10-CM | POA: Diagnosis not present

## 2016-01-11 ENCOUNTER — Encounter: Payer: Self-pay | Admitting: Physician Assistant

## 2016-01-11 ENCOUNTER — Ambulatory Visit (INDEPENDENT_AMBULATORY_CARE_PROVIDER_SITE_OTHER): Payer: Medicare Other | Admitting: Physician Assistant

## 2016-01-11 VITALS — BP 134/59 | HR 88 | Temp 98.9°F | Ht 63.0 in | Wt 141.0 lb

## 2016-01-11 DIAGNOSIS — R300 Dysuria: Secondary | ICD-10-CM

## 2016-01-11 DIAGNOSIS — R3915 Urgency of urination: Secondary | ICD-10-CM

## 2016-01-11 DIAGNOSIS — N3001 Acute cystitis with hematuria: Secondary | ICD-10-CM | POA: Diagnosis not present

## 2016-01-11 DIAGNOSIS — R8290 Unspecified abnormal findings in urine: Secondary | ICD-10-CM

## 2016-01-11 DIAGNOSIS — R109 Unspecified abdominal pain: Secondary | ICD-10-CM | POA: Diagnosis not present

## 2016-01-11 DIAGNOSIS — R8299 Other abnormal findings in urine: Secondary | ICD-10-CM | POA: Diagnosis not present

## 2016-01-11 LAB — POCT URINALYSIS DIPSTICK
Bilirubin, UA: NEGATIVE
Glucose, UA: NEGATIVE
Ketones, UA: NEGATIVE
Nitrite, UA: POSITIVE
Protein, UA: NEGATIVE
Spec Grav, UA: 1.01
Urobilinogen, UA: 0.2
pH, UA: 7

## 2016-01-11 MED ORDER — CEFTRIAXONE SODIUM 1 G IJ SOLR
1.0000 g | Freq: Once | INTRAMUSCULAR | Status: AC
  Administered 2016-01-11: 1 g via INTRAMUSCULAR

## 2016-01-11 MED ORDER — CIPROFLOXACIN HCL 500 MG PO TABS: 500.0000 mg | ORAL_TABLET | Freq: Two times a day (BID) | ORAL | Status: DC

## 2016-01-11 NOTE — Patient Instructions (Signed)

## 2016-01-11 NOTE — Progress Notes (Signed)
   Subjective:    Patient ID: [name], female    DOB: 1946-05-17, 70 y.o.   MRN: [id number]  HPI  Pt is a 70 yo female who presents to the clinic with dysuria, frequent urination and urgency for last week. She is now having some mid back and flank pain. Denies any fever, chills, n/v/d. Taking AzO with no relief.      Review of Systems See HPI.     Objective:   Physical Exam  Constitutional: She is oriented to person, place, and time. She appears well-developed and well-nourished.  HENT:  Head: Normocephalic and atraumatic.  Cardiovascular: Normal rate, regular rhythm and normal heart sounds.   Pulmonary/Chest: Effort normal and breath sounds normal.  Bilateral CVA tenderness.   Abdominal: Soft. Bowel sounds are normal.  Tenderness over suprapubic area to palpation. No rebound and/or guarding.   Neurological: She is alert and oriented to person, place, and time.  Psychiatric: She has a normal mood and affect. Her behavior is normal.          Assessment & Plan:  Acute cystitis with hematuria- concerning for pyleonephritis due to tenderness over bilateral CVA.  .. Results for orders placed or performed in visit on 01/11/16  POCT urinalysis dipstick  Result Value Ref Range   Color, UA yellow    Clarity, UA cloudy    Glucose, UA neg    Bilirubin, UA neg    Ketones, UA neg    Spec Grav, UA 1.010    Blood, UA small    pH, UA 7.0    Protein, UA neg    Urobilinogen, UA 0.2    Nitrite, UA pos    Leukocytes, UA large (3+) (A) Negative   Will culture.  Treated with cipro for 10 days. Given rocephin 1gram in office today.  Increase hydration.  Follow up as needed.  BP improved on 2nd recheck.

## 2016-01-14 LAB — URINE CULTURE: Colony Count: >=100000

## 2016-01-18 ENCOUNTER — Ambulatory Visit (INDEPENDENT_AMBULATORY_CARE_PROVIDER_SITE_OTHER): Payer: Medicare Other | Admitting: Physician Assistant

## 2016-01-18 VITALS — BP 137/68 | HR 79 | Wt 140.0 lb

## 2016-01-18 DIAGNOSIS — N3001 Acute cystitis with hematuria: Secondary | ICD-10-CM | POA: Diagnosis not present

## 2016-01-18 DIAGNOSIS — N3000 Acute cystitis without hematuria: Secondary | ICD-10-CM | POA: Diagnosis not present

## 2016-01-18 DIAGNOSIS — I10 Essential (primary) hypertension: Secondary | ICD-10-CM

## 2016-01-18 LAB — POCT URINALYSIS DIPSTICK
Bilirubin, UA: NEGATIVE
Blood, UA: NEGATIVE
Glucose, UA: NEGATIVE
Ketones, UA: NEGATIVE
Leukocytes, UA: NEGATIVE
Nitrite, UA: NEGATIVE
Protein, UA: NEGATIVE
Spec Grav, UA: 1.015
Urobilinogen, UA: 0.2
pH, UA: 6

## 2016-01-18 NOTE — Progress Notes (Signed)
   Subjective:    Patient ID: [name], female    DOB: 10-Jun-1946, 70 y.o.   MRN: [id number]  HPI  Pt is a 70 yo female who presents to the clinic for UTI follow up. She is on her last day of cipro. She is feeling much better. Denies any flank pain or dysuria. No fever, chills, n/v/d. Denies any CP, palpitations, SOB, headaches.   Review of Systems  All other systems reviewed and are negative.     Objective:   Physical Exam  Constitutional: She is oriented to person, place, and time. She appears well-developed and well-nourished.  HENT:  Head: Normocephalic and atraumatic.  Cardiovascular: Normal rate, regular rhythm and normal heart sounds.   Pulmonary/Chest: Effort normal and breath sounds normal. She has no wheezes.  No CVA tenderness.   Abdominal: Soft. Bowel sounds are normal. She exhibits no distension and no mass. There is no tenderness. There is no rebound and no guarding.  Neurological: She is alert and oriented to person, place, and time.  Psychiatric: She has a normal mood and affect. Her behavior is normal.          Assessment & Plan:  Acute cystitis with hematuria- .. Results for orders placed or performed in visit on 01/18/16  Urinalysis Dipstick  Result Value Ref Range   Color, UA yellow    Clarity, UA clear    Glucose, UA negative    Bilirubin, UA negative    Ketones, UA negative    Spec Grav, UA 1.015    Blood, UA negative    pH, UA 6.0    Protein, UA negative    Urobilinogen, UA 0.2    Nitrite, UA negtive    Leukocytes, UA Negative Negative   Looks clear. Will culture to confirm. Follow up as needed.   HTN- recheck looked great.

## 2016-01-20 LAB — URINE CULTURE
Colony Count: NO GROWTH
Organism ID, Bacteria: NO GROWTH

## 2016-02-20 DIAGNOSIS — I6522 Occlusion and stenosis of left carotid artery: Secondary | ICD-10-CM | POA: Diagnosis not present

## 2016-02-25 DIAGNOSIS — F329 Major depressive disorder, single episode, unspecified: Secondary | ICD-10-CM | POA: Diagnosis not present

## 2016-02-25 DIAGNOSIS — I6523 Occlusion and stenosis of bilateral carotid arteries: Secondary | ICD-10-CM | POA: Diagnosis not present

## 2016-02-25 DIAGNOSIS — G459 Transient cerebral ischemic attack, unspecified: Secondary | ICD-10-CM | POA: Diagnosis not present

## 2016-02-28 ENCOUNTER — Ambulatory Visit (INDEPENDENT_AMBULATORY_CARE_PROVIDER_SITE_OTHER): Payer: Medicare Other | Admitting: Adult Health

## 2016-02-28 ENCOUNTER — Encounter: Payer: Self-pay | Admitting: Adult Health

## 2016-02-28 VITALS — BP 150/70 | HR 79 | Temp 97.7°F | Ht 63.0 in | Wt 140.0 lb

## 2016-02-28 DIAGNOSIS — Z85118 Personal history of other malignant neoplasm of bronchus and lung: Secondary | ICD-10-CM | POA: Diagnosis not present

## 2016-02-28 DIAGNOSIS — J449 Chronic obstructive pulmonary disease, unspecified: Secondary | ICD-10-CM | POA: Diagnosis not present

## 2016-02-28 DIAGNOSIS — J4489 Other specified chronic obstructive pulmonary disease: Secondary | ICD-10-CM

## 2016-02-28 NOTE — Assessment & Plan Note (Signed)
NSCLC s/p resection with seed implantation  In 2005  Serial CT without dz recurrence thus far follow up CT chest as discussed in August 2017 .

## 2016-02-28 NOTE — Patient Instructions (Signed)
Continue on current regimen .  CT chest in August as discussed   Follow up with Primary MD as your blood pressure is elevated.  Follow up Dr. Elsworth Soho  In 1 year and As needed

## 2016-02-28 NOTE — Assessment & Plan Note (Signed)
Very severe COPD compensated on present regimen  Vaccines utd.   Plan  Continue on current regimen .  CT chest in August as discussed   Follow up with Primary MD as your blood pressure is elevated.  Follow up Dr. Elsworth Soho  In 1 year and As needed

## 2016-02-28 NOTE — Progress Notes (Signed)
Subjective:    Patient ID: [name], female    DOB: May 08, 1946, 70 y.o.   MRN: [id number]  HPI 70 yo female former smoker with Severe COPD and previous lung cancer s/p LUL resection /seed implantation  in 2005 with NSCLC   TEST  Arlyce Harman Aug 2014: feV1 28% CT chest 04/2015 with no change, no evidence of recurrence.   02/28/2016 Follow up : COPD /Lung cancer hx  Pt returns for 1 year follow up .  Says overall is doing okay. Gets sob with prolonged walking , carrying heavy items and going up stairs.  She remains on QVAR, Brovana and Spiriva .  She is very independent, does housework, ,driving and shopping.  No flare of cough, wheezing or DOE.  Due for CT chest this summer for serial follow up .  CT last July with no evidence of dz recurrence.  PVX, Prevnar 13, TDAP, Influenza,  and Zoster are utd.      Past Medical History  Diagnosis Date  . CAD (coronary artery disease)     20% stenosis on cath - no intervention required (W-S cards), negative nuclear stress test 11/07  . Hurthle cell adenoma of thyroid 2007  . Cancer [facility]) 2007    lung (Dr. Earlie Server and Dr. Arlyce Dice)  . RLS (restless legs syndrome)   . Peripheral vascular disease ([place])   . Iron deficiency anemia 2/08    s/p 2 unit transfusion  . Hypertension   . COPD (chronic obstructive pulmonary disease) ([place]) 9/10    golds stage III, FeV1 39%  . Lung cancer ([place])   . Bronchopleural fistula ([place])    Current Outpatient Prescriptions on File Prior to Visit  Medication Sig Dispense Refill  . albuterol (PROVENTIL) (2.5 MG/3ML) 0.083% nebulizer solution Take 3 mLs (2.5 mg total) by nebulization every 6 (six) hours as needed. 75 mL 4  . ALPRAZolam (XANAX) 1 MG tablet take 1 tablet by mouth once daily if needed 90 tablet 1  . amLODipine (NORVASC) 5 MG tablet Take 1 tablet (5 mg total) by mouth daily. 90 tablet 1  . arformoterol (BROVANA) 15 MCG/2ML NEBU Take 2 mLs (15 mcg total) by nebulization 2 (two) times daily. 360 mL 4    . atorvastatin (LIPITOR) 20 MG tablet Take 1 tablet (20 mg total) by mouth at bedtime. take 1 tablet by mouth once daily 90 tablet 4  . beclomethasone (QVAR) 40 MCG/ACT inhaler Inhale 2 puffs into the lungs 2 (two) times daily. 3 Inhaler 4  . clopidogrel (PLAVIX) 75 MG tablet Take 1 tablet (75 mg total) by mouth daily. 90 tablet 4  . levothyroxine (SYNTHROID, LEVOTHROID) 50 MCG tablet take 1 tablet by mouth once daily 90 tablet 4  . QUEtiapine (SEROQUEL) 50 MG tablet Take 50 mg by mouth at bedtime. 1-2 tablets by mouth at bedtime.    Marland Kitchen tiotropium (SPIRIVA) 18 MCG inhalation capsule Place 1 capsule (18 mcg total) into inhaler and inhale daily. 90 capsule 4   No current facility-administered medications on file prior to visit.       Review of Systems Constitutional:   No  weight loss, night sweats,  Fevers, chills, fatigue, or  lassitude.  HEENT:   No headaches,  Difficulty swallowing,  Tooth/dental problems, or  Sore throat,                No sneezing, itching, ear ache, nasal congestion, post nasal drip,   CV:  No chest pain,  Orthopnea, PND, swelling  in lower extremities, anasarca, dizziness, palpitations, syncope.   GI  No heartburn, indigestion, abdominal pain, nausea, vomiting, diarrhea, change in bowel habits, loss of appetite, bloody stools.   Resp: .  No chest wall deformity  Skin: no rash or lesions.  GU: no dysuria, change in color of urine, no urgency or frequency.  No flank pain, no hematuria   MS:  No joint pain or swelling.  No decreased range of motion.  No back pain.  Psych:  No change in mood or affect. No depression or anxiety.  No memory loss.          Objective:   Physical Exam  Filed Vitals:   02/28/16 1021  Pulse: 79  Temp: 97.7 F (36.5 C)  TempSrc: Oral  Height: '5\' 3"'$  (1.6 m)  Weight: 140 lb (63.504 kg)  SpO2: 97%   GEN: A/Ox3; pleasant , NAD, well nourished   HEENT:  [place]/AT,  EACs-clear, TMs-wnl, NOSE-clear, THROAT-clear, no lesions, no  postnasal drip or exudate noted.   NECK:  Supple w/ fair ROM; no JVD; normal carotid impulses w/o bruits; no thyromegaly or nodules palpated; no lymphadenopathy.  RESP  Decreased BS in bases , no accessory muscle use, no dullness to percussion  CARD:  RRR, no m/r/g  , no peripheral edema, pulses intact, no cyanosis or clubbing.  GI:   Soft & nt; nml bowel sounds; no organomegaly or masses detected.  Musco: Warm bil, no deformities or joint swelling noted.   Neuro: alert, no focal deficits noted.    Skin: Warm, no lesions or rashes  Tammy Parrett NP-C  [facility]  02/28/2016       Assessment & Plan:

## 2016-04-14 ENCOUNTER — Ambulatory Visit (INDEPENDENT_AMBULATORY_CARE_PROVIDER_SITE_OTHER): Payer: Medicare Other | Admitting: Physician Assistant

## 2016-04-14 ENCOUNTER — Encounter: Payer: Self-pay | Admitting: Physician Assistant

## 2016-04-14 VITALS — BP 187/71 | HR 91 | Ht 63.0 in | Wt 137.0 lb

## 2016-04-14 DIAGNOSIS — F419 Anxiety disorder, unspecified: Secondary | ICD-10-CM

## 2016-04-14 DIAGNOSIS — R413 Other amnesia: Secondary | ICD-10-CM | POA: Diagnosis not present

## 2016-04-14 DIAGNOSIS — Z1231 Encounter for screening mammogram for malignant neoplasm of breast: Secondary | ICD-10-CM

## 2016-04-14 DIAGNOSIS — F32A Depression, unspecified: Secondary | ICD-10-CM

## 2016-04-14 DIAGNOSIS — F411 Generalized anxiety disorder: Secondary | ICD-10-CM

## 2016-04-14 DIAGNOSIS — Z0000 Encounter for general adult medical examination without abnormal findings: Secondary | ICD-10-CM | POA: Diagnosis not present

## 2016-04-14 DIAGNOSIS — Z13820 Encounter for screening for osteoporosis: Secondary | ICD-10-CM

## 2016-04-14 DIAGNOSIS — F430 Acute stress reaction: Secondary | ICD-10-CM

## 2016-04-14 DIAGNOSIS — F329 Major depressive disorder, single episode, unspecified: Secondary | ICD-10-CM

## 2016-04-14 DIAGNOSIS — Z780 Asymptomatic menopausal state: Secondary | ICD-10-CM

## 2016-04-14 MED ORDER — CITALOPRAM HYDROBROMIDE 10 MG PO TABS: 10.0000 mg | ORAL_TABLET | Freq: Every day | ORAL | Status: DC

## 2016-04-14 MED ORDER — ESCITALOPRAM OXALATE 5 MG PO TABS: 5.0000 mg | ORAL_TABLET | Freq: Every day | ORAL | Status: DC

## 2016-04-14 NOTE — Progress Notes (Signed)
Subjective:    [name] is a 70 y.o. female who presents for Medicare Annual/Subsequent preventive examination.  Preventive Screening-Counseling & Management  Tobacco History  Smoking status  . Former Smoker -- 1.50 packs/day for 30 years  . Types: Cigarettes  . Quit date: 10/20/2004  Smokeless tobacco  . Never Used     Problems Prior to Visit She continues to have memory concerns. Only short term problems. She still has great long term memory. She forgets appt times and not fast on her feet. If some one asks a question it can take a while for her to figure out what to say. She is under a lot of stress at home. Her husband is scheduled for shoulder sugery in July. Her daughter continues to live with her and causes a lot of extra stress in her life. She also lives with 6 year old grandson. She has been taking xanax as needed but feels she needs it every day.   Current Problems (verified) Patient Active Problem List   Diagnosis Date Noted  . History of lung cancer 07/28/2006    Priority: High  . COPD with chronic bronchitis 07/28/2006    Priority: High  . ANEMIA, IRON DEFICIENCY NOS 01/04/2007    Priority: Medium  . Major depressive disorder, recurrent episode ([place]) 07/28/2006    Priority: Medium  . Anxiety as acute reaction to exceptional stress 12/05/2015  . Muscle spasm of back 12/05/2015  . Memory changes 12/05/2015  . Elevated blood pressure 10/16/2015  . Split S2 (second heart sound) 10/16/2015  . IFG (impaired fasting glucose) 04/12/2015  . Transient cerebral ischemic attack 03/01/2014  . Carotid artery narrowing 02/27/2014  . Clinical depression 02/27/2014  . Episode of abnormal behavior 11/28/2013  . Word finding difficulty 11/28/2013  . Memory loss 11/28/2013  . Systolic murmur 85/46/2703  . Sensorineural hearing loss of both ears 11/18/2013  . Insomnia 09/04/2013  . GERD (gastroesophageal reflux disease) 09/04/2013  . History of hip fracture 09/02/2013   . Hypoxemia 06/22/2013  . Cerebrovascular accident, old 06/14/2013  . HYPOTHYROIDISM, POSTSURGICAL 05/19/2007  . DISEASE, ISCHEMIC HEART, CHRONIC NOS 05/19/2007  . HYPERCHOLESTEROLEMIA 07/28/2006  . Anxiety state 07/28/2006  . HYPERTENSION, BENIGN SYSTEMIC 07/28/2006  . PERIPHERAL VASCULAR DISEASE, UNSPEC. 07/28/2006    Medications Prior to Visit Current Outpatient Prescriptions on File Prior to Visit  Medication Sig Dispense Refill  . albuterol (PROVENTIL) (2.5 MG/3ML) 0.083% nebulizer solution Take 3 mLs (2.5 mg total) by nebulization every 6 (six) hours as needed. 75 mL 4  . ALPRAZolam (XANAX) 1 MG tablet take 1 tablet by mouth once daily if needed 90 tablet 1  . amLODipine (NORVASC) 5 MG tablet Take 1 tablet (5 mg total) by mouth daily. 90 tablet 1  . arformoterol (BROVANA) 15 MCG/2ML NEBU Take 2 mLs (15 mcg total) by nebulization 2 (two) times daily. 360 mL 4  . atorvastatin (LIPITOR) 20 MG tablet Take 1 tablet (20 mg total) by mouth at bedtime. take 1 tablet by mouth once daily 90 tablet 4  . beclomethasone (QVAR) 40 MCG/ACT inhaler Inhale 2 puffs into the lungs 2 (two) times daily. 3 Inhaler 4  . clopidogrel (PLAVIX) 75 MG tablet Take 1 tablet (75 mg total) by mouth daily. 90 tablet 4  . levothyroxine (SYNTHROID, LEVOTHROID) 50 MCG tablet take 1 tablet by mouth once daily 90 tablet 4  . QUEtiapine (SEROQUEL) 50 MG tablet Take 50 mg by mouth at bedtime. 1-2 tablets by mouth at bedtime.    Marland Kitchen  tiotropium (SPIRIVA) 18 MCG inhalation capsule Place 1 capsule (18 mcg total) into inhaler and inhale daily. 90 capsule 4   No current facility-administered medications on file prior to visit.    Current Medications (verified) Current Outpatient Prescriptions  Medication Sig Dispense Refill  . albuterol (PROVENTIL) (2.5 MG/3ML) 0.083% nebulizer solution Take 3 mLs (2.5 mg total) by nebulization every 6 (six) hours as needed. 75 mL 4  . ALPRAZolam (XANAX) 1 MG tablet take 1 tablet by mouth  once daily if needed 90 tablet 1  . amLODipine (NORVASC) 5 MG tablet Take 1 tablet (5 mg total) by mouth daily. 90 tablet 1  . arformoterol (BROVANA) 15 MCG/2ML NEBU Take 2 mLs (15 mcg total) by nebulization 2 (two) times daily. 360 mL 4  . atorvastatin (LIPITOR) 20 MG tablet Take 1 tablet (20 mg total) by mouth at bedtime. take 1 tablet by mouth once daily 90 tablet 4  . beclomethasone (QVAR) 40 MCG/ACT inhaler Inhale 2 puffs into the lungs 2 (two) times daily. 3 Inhaler 4  . clopidogrel (PLAVIX) 75 MG tablet Take 1 tablet (75 mg total) by mouth daily. 90 tablet 4  . levothyroxine (SYNTHROID, LEVOTHROID) 50 MCG tablet take 1 tablet by mouth once daily 90 tablet 4  . QUEtiapine (SEROQUEL) 50 MG tablet Take 50 mg by mouth at bedtime. 1-2 tablets by mouth at bedtime.    Marland Kitchen tiotropium (SPIRIVA) 18 MCG inhalation capsule Place 1 capsule (18 mcg total) into inhaler and inhale daily. 90 capsule 4  . escitalopram (LEXAPRO) 5 MG tablet Take 1 tablet (5 mg total) by mouth daily. 30 tablet 1   No current facility-administered medications for this visit.     Allergies (verified) Desvenlafaxine succinate er; Lisinopril-hydrochlorothiazide; Nitrofurantoin monohyd macro; Penicillins; and Zoloft   PAST HISTORY  Family History Family History  Problem Relation Age of Onset  . Cancer Mother     Larynx & Endometrial cancer  . Cancer Father     laryngeal cancer  . Hypertension Father   . Diabetes Sister   . Multiple sclerosis Sister   . Cancer Sister 80    breast    Social History Social History  Substance Use Topics  . Smoking status: Former Smoker -- 1.50 packs/day for 30 years    Types: Cigarettes    Quit date: 10/20/2004  . Smokeless tobacco: Never Used  . Alcohol Use: No     Are there smokers in your home (other than you)? No  Risk Factors Current exercise habits: The patient does not participate in regular exercise at present.  Dietary issues discussed: none   Cardiac risk factors:  advanced age (older than 42 for men, 56 for women), dyslipidemia, family history of premature cardiovascular disease and sedentary lifestyle.  Depression Screen (Note: if answer to either of the following is "Yes", a more complete depression screening is indicated)   Over the past two weeks, have you felt down, depressed or hopeless? Yes  Over the past two weeks, have you felt little interest or pleasure in doing things? Yes  Have you lost interest or pleasure in daily life? Yes  Do you often feel hopeless? Yes  Do you cry easily over simple problems? Yes  Activities of Daily Living In your present state of health, do you have any difficulty performing the following activities?:  Driving? No Managing money?  No Feeding yourself? No Getting from bed to chair? No Climbing a flight of stairs? No Preparing food and eating?: No Bathing or  showering? No Getting dressed: No Getting to the toilet? No Using the toilet:No Moving around from place to place: No In the past year have you fallen or had a near fall?:No   Are you sexually active?  No  Do you have more than one partner?  No  Hearing Difficulties: No Do you often ask people to speak up or repeat themselves? No Do you experience ringing or noises in your ears? No Do you have difficulty understanding soft or whispered voices? No   Do you feel that you have a problem with memory? Yes  Do you often misplace items? Yes  Do you feel safe at home?  Yes  Cognitive Testing  Alert? Yes  Normal Appearance?Yes  Oriented to person? Yes  Place? Yes   Time? Yes  Recall of three objects?  Yes  Can perform simple calculations? No  Displays appropriate judgment?Yes  Can read the correct time from a watch face?Yes   Advanced Directives have been discussed with the patient? Yes  List the Names of Other Physician/Practitioners you currently use: 1.    Indicate any recent Medical Services you may have received from other than [facility]  providers in the past year (date may be approximate).  Immunization History  Administered Date(s) Administered  . H1N1 08/21/2008  . Influenza Split 09/02/2011, 09/07/2012  . Influenza Whole 08/25/2006, 08/19/2007, 08/21/2008, 07/23/2009, 07/31/2010  . Influenza,inj,Quad PF,36+ Mos 07/20/2013, 08/06/2015  . Pneumococcal Conjugate-13 05/11/2014  . Pneumococcal Polysaccharide-23 08/25/2006, 11/29/2012  . Tdap 03/04/2011  . Zoster 12/25/2011    Screening Tests Health Maintenance  Topic Date Due  . Hepatitis C Screening  1946-05-25  . DEXA SCAN  04/09/2012  . MAMMOGRAM  08/17/2015  . INFLUENZA VACCINE  05/20/2016  . COLONOSCOPY  08/23/2019  . TETANUS/TDAP  03/03/2021  . ZOSTAVAX  Completed  . PNA vac Low Risk Adult  Completed    All answers were reviewed with the patient and necessary referrals were made:  Iran Planas, PA-C   04/14/2016   History reviewed: allergies, current medications, past family history, past medical history, past social history, past surgical history and problem list  Review of Systems Pertinent items noted in HPI and remainder of comprehensive ROS otherwise negative.    Objective:     Vision by Snellen chart: right eye:20/30, left eye:20/30  Body mass index is 24.27 kg/(m^2). BP 187/71 mmHg  Pulse 91  Ht '5\' 3"'$  (1.6 m)  Wt 137 lb (62.143 kg)  BMI 24.27 kg/m2  BP 187/71 mmHg  Pulse 91  Ht '5\' 3"'$  (1.6 m)  Wt 137 lb (62.143 kg)  BMI 24.27 kg/m2  General Appearance:    Alert, cooperative, no distress, appears stated age  Head:    Normocephalic, without obvious abnormality, atraumatic  Eyes:    PERRL, conjunctiva/corneas clear, EOM's intact, fundi    benign, both eyes  Ears:    Normal TM's and external ear canals, both ears  Nose:   Nares normal, septum midline, mucosa normal, no drainage    or sinus tenderness  Throat:   Lips, mucosa, and tongue normal; teeth and gums normal  Neck:   Supple, symmetrical, trachea midline, no adenopathy;     thyroid:  no enlargement/tenderness/nodules; no carotid   bruit or JVD  Back:     Symmetric, no curvature, ROM normal, no CVA tenderness  Lungs:     Clear to auscultation bilaterally, respirations unlabored  Chest Wall:    No tenderness or deformity   Heart:  Regular rate and rhythm, S1 and S2 normal, no murmur, rub   or gallop  Breast Exam:    No tenderness, masses, or nipple abnormality  Abdomen:     Soft, non-tender, bowel sounds active all four quadrants,    no masses, no organomegaly  Genitalia:  Not done.   Rectal:  Not done.   Extremities:   Extremities normal, atraumatic, no cyanosis or edema  Pulses:   2+ and symmetric all extremities  Skin:   Skin color, texture, turgor normal, no rashes or lesions  Lymph nodes:   Cervical, supraclavicular, and axillary nodes normal  Neurologic:   CNII-XII intact, normal strength, sensation and reflexes    throughout       Assessment:     70 yo female with some memory, anxiety, and depression issues.     Plan:     During the course of the visit the patient was educated and counseled about appropriate screening and preventive services including:   Up to date lipid and cmp. Needs to have drawn after august 2017. Normal last year.   Mammogram and dexa scan ordered today.   Depression/anxiety- PHQ-9 was 21 and GAD-7 was 21. Started lexapro '5mg'$ . Had reaction to zoloft. Discussed SE"s. Follow up in 6 weeks. Continue to use xanax as needed. Discussed abuse potential.   Memory changes/word finding difficultly- 6CIT was 14/28. Animal fluency was 14 animals in 1 minute which needed more evaluation. Unclear if depression and anxiety at home is causing some of this memory issue. Pt has had CT of brain to look for vascular causes. She has hx of TIA and on STATIN. Will see if lexapro improves cognitive function. If not would like to consider aricept.     Patient Instructions (the written plan) was given to the patient.  Medicare  Attestation I have personally reviewed: The patient's medical and social history Their use of alcohol, tobacco or illicit drugs Their current medications and supplements The patient's functional ability including ADLs,fall risks, home safety risks, cognitive, and hearing and visual impairment Diet and physical activities Evidence for depression or mood disorders  The patient's weight, height, BMI, and visual acuity have been recorded in the chart.  I have made referrals, counseling, and provided education to the patient based on review of the above and I have provided the patient with a written personalized care plan for preventive services.     Iran Planas, PA-C   04/14/2016

## 2016-04-14 NOTE — Patient Instructions (Addendum)
Start lexapro '5mg'$  follow up 4-6 weeks.   Keeping You Healthy  Get These Tests  Blood Pressure- Have your blood pressure checked by your healthcare provider at least once a year.  Normal blood pressure is 120/80.  Weight- Have your body mass index (BMI) calculated to screen for obesity.  BMI is a measure of body fat based on height and weight.  You can calculate your own BMI at [employer]  Cholesterol- Have your cholesterol checked every year.  Diabetes- Have your blood sugar checked every year if you have high blood pressure, high cholesterol, a family history of diabetes or if you are overweight.  Pap Test - Have a pap test every 1 to 5 years if you have been sexually active.  If you are older than 65 and recent pap tests have been normal you may not need additional pap tests.  In addition, if you have had a hysterectomy  for benign disease additional pap tests are not necessary.  Mammogram-Yearly mammograms are essential for early detection of breast cancer  Screening for Colon Cancer- Colonoscopy starting at age 66. Screening may begin sooner depending on your family history and other health conditions.  Follow up colonoscopy as directed by your Gastroenterologist.  Screening for Osteoporosis- Screening begins at age 98 with bone density scanning, sooner if you are at higher risk for developing Osteoporosis.  Get these medicines  Calcium with Vitamin D- Your body requires 1200-1500 mg of Calcium a day and [phone] IU of Vitamin D a day.  You can only absorb 500 mg of Calcium at a time therefore Calcium must be taken in 2 or 3 separate doses throughout the day.  Hormones- Hormone therapy has been associated with increased risk for certain cancers and heart disease.  Talk to your healthcare provider about if you need relief from menopausal symptoms.  Aspirin- Ask your healthcare provider about taking Aspirin to prevent Heart Disease and Stroke.  Get these  Immuniztions  Flu shot- Every fall  Pneumonia shot- Once after the age of 30; if you are younger ask your healthcare provider if you need a pneumonia shot.  Tetanus- Every ten years.  Zostavax- Once after the age of 28 to prevent shingles.  Take these steps  Don't smoke- Your healthcare provider can help you quit. For tips on how to quit, ask your healthcare provider or go to www.smokefree.gov or call 1-800 QUIT-NOW.  Be physically active- Exercise 5 days a week for a minimum of 30 minutes.  If you are not already physically active, start slow and gradually work up to 30 minutes of moderate physical activity.  Try walking, dancing, bike riding, swimming, etc.  Eat a healthy diet- Eat a variety of healthy foods such as fruits, vegetables, whole grains, low fat milk, low fat cheeses, yogurt, lean meats, chicken, fish, eggs, dried beans, tofu, etc.  For more information go to www.thenutritionsource.org  Dental visit- Brush and floss teeth twice daily; visit your dentist twice a year.  Eye exam- Visit your Optometrist or Ophthalmologist yearly.  Drink alcohol in moderation- Limit alcohol intake to one drink or less a day.  Never drink and drive.  Depression- Your emotional health is as important as your physical health.  If you're feeling down or losing interest in things you normally enjoy, please talk to your healthcare provider.  Seat Belts- can save your life; always wear one  Smoke/Carbon Monoxide detectors- These detectors need to be installed on the appropriate level of your home.  Replace  batteries at least once a year.  Violence- If anyone is threatening or hurting you, please tell your healthcare provider.  Living Will/ Health care power of attorney- Discuss with your healthcare provider and family.

## 2016-04-21 ENCOUNTER — Encounter: Payer: Self-pay | Admitting: Physician Assistant

## 2016-04-30 ENCOUNTER — Encounter: Payer: Self-pay | Admitting: Physician Assistant

## 2016-04-30 ENCOUNTER — Ambulatory Visit (INDEPENDENT_AMBULATORY_CARE_PROVIDER_SITE_OTHER): Payer: Medicare Other

## 2016-04-30 DIAGNOSIS — Z780 Asymptomatic menopausal state: Secondary | ICD-10-CM | POA: Diagnosis not present

## 2016-04-30 DIAGNOSIS — M8580 Other specified disorders of bone density and structure, unspecified site: Secondary | ICD-10-CM | POA: Insufficient documentation

## 2016-04-30 DIAGNOSIS — Z1231 Encounter for screening mammogram for malignant neoplasm of breast: Secondary | ICD-10-CM | POA: Diagnosis not present

## 2016-04-30 DIAGNOSIS — R918 Other nonspecific abnormal finding of lung field: Secondary | ICD-10-CM | POA: Diagnosis not present

## 2016-04-30 DIAGNOSIS — Z85118 Personal history of other malignant neoplasm of bronchus and lung: Secondary | ICD-10-CM | POA: Diagnosis not present

## 2016-04-30 DIAGNOSIS — M85852 Other specified disorders of bone density and structure, left thigh: Secondary | ICD-10-CM | POA: Diagnosis not present

## 2016-04-30 DIAGNOSIS — R928 Other abnormal and inconclusive findings on diagnostic imaging of breast: Secondary | ICD-10-CM

## 2016-04-30 DIAGNOSIS — M8588 Other specified disorders of bone density and structure, other site: Secondary | ICD-10-CM | POA: Diagnosis not present

## 2016-05-01 ENCOUNTER — Other Ambulatory Visit: Payer: Self-pay | Admitting: Adult Health

## 2016-05-01 MED ORDER — ARFORMOTEROL TARTRATE 15 MCG/2ML IN NEBU: 15.0000 ug | INHALATION_SOLUTION | Freq: Two times a day (BID) | RESPIRATORY_TRACT | Status: DC

## 2016-05-01 MED ORDER — BECLOMETHASONE DIPROPIONATE 40 MCG/ACT IN AERS: 2.0000 | INHALATION_SPRAY | Freq: Two times a day (BID) | RESPIRATORY_TRACT | Status: DC

## 2016-05-01 MED ORDER — TIOTROPIUM BROMIDE MONOHYDRATE 18 MCG IN CAPS: 18.0000 ug | ORAL_CAPSULE | Freq: Every day | RESPIRATORY_TRACT | Status: DC

## 2016-05-01 MED ORDER — ALBUTEROL SULFATE (2.5 MG/3ML) 0.083% IN NEBU: 2.5000 mg | INHALATION_SOLUTION | Freq: Four times a day (QID) | RESPIRATORY_TRACT | Status: DC | PRN

## 2016-05-01 NOTE — Progress Notes (Signed)
Quick Note:  Called spoke with pt. Reviewed results and recs. She voiced understanding and had no further questions. Nothing further needed. ______

## 2016-05-01 NOTE — Telephone Encounter (Signed)
Last ov with TP on 02/28/16 Patient Instructions       Continue on current regimen .   CT chest in August as discussed    Follow up with Primary MD as your blood pressure is elevated.  Follow up Dr. Elsworth Soho  In 1 year and As needed     Called spoke with pt to review TP's results and recs. She states at her last ov with TP she was told that refills for her albuterol, brovana, qvar, and spriva would be sent into med's by mail. I apologized to her and sent refills for all medications listed above. She voiced understanding and had no further questions. All prescriptions have been sent to the pharmacy. Nothing further needed.

## 2016-05-05 ENCOUNTER — Other Ambulatory Visit: Payer: Self-pay | Admitting: Physician Assistant

## 2016-05-05 DIAGNOSIS — R928 Other abnormal and inconclusive findings on diagnostic imaging of breast: Secondary | ICD-10-CM

## 2016-05-26 ENCOUNTER — Ambulatory Visit: Payer: Medicare Other | Admitting: Physician Assistant

## 2016-05-27 ENCOUNTER — Ambulatory Visit (INDEPENDENT_AMBULATORY_CARE_PROVIDER_SITE_OTHER): Payer: Medicare Other | Admitting: Physician Assistant

## 2016-05-27 ENCOUNTER — Encounter: Payer: Self-pay | Admitting: Physician Assistant

## 2016-05-27 VITALS — BP 159/46 | HR 80 | Ht 63.0 in | Wt 136.0 lb

## 2016-05-27 DIAGNOSIS — F419 Anxiety disorder, unspecified: Secondary | ICD-10-CM | POA: Diagnosis not present

## 2016-05-27 DIAGNOSIS — F411 Generalized anxiety disorder: Secondary | ICD-10-CM

## 2016-05-27 DIAGNOSIS — I10 Essential (primary) hypertension: Secondary | ICD-10-CM

## 2016-05-27 DIAGNOSIS — R413 Other amnesia: Secondary | ICD-10-CM

## 2016-05-27 DIAGNOSIS — F430 Acute stress reaction: Secondary | ICD-10-CM

## 2016-05-27 MED ORDER — ESCITALOPRAM OXALATE 5 MG PO TABS: 5.0000 mg | ORAL_TABLET | Freq: Every day | ORAL | 1 refills | Status: DC

## 2016-05-27 MED ORDER — AMLODIPINE BESYLATE 5 MG PO TABS: 5.0000 mg | ORAL_TABLET | Freq: Every day | ORAL | 1 refills | Status: DC

## 2016-05-27 MED ORDER — ALPRAZOLAM 1 MG PO TABS: ORAL_TABLET | ORAL | 0 refills | Status: DC

## 2016-05-27 NOTE — Progress Notes (Signed)
   Subjective:    Patient ID: [name], female    DOB: 21-Jan-1946, 70 y.o.   MRN: [id number]  HPI Patient is a 70 year old female who presents to the clinic to follow-up after her complete physical where she reported a lot of memory issues that were concerning her. She does have a history of TIA and stroke. We did do a CT of her brain and we found nothing suggestive of TIAs, strokes, dementia. I thought a lot of her memory loss could be coming from the stress of so many family members living with her. They put a lot of expectations on the patient. She is taking care of a lot of grandchildren as their primary caregiver. Since starting Lexapro there has been a significant improvement in her mood and memory. She is very happy with the response. She feels like she is making better decisions. She recently has told her one daughter that she must move out. But it is not working out for her living in the house. She feels like this is a good decision and that it will continue to improve her family situation and memory.  She needs a medication refill on her hypertension. She denies any chest pains, palpitations, headaches or dizziness. She does not check her blood pressure at home.   Review of Systems See history of present illness.    Objective:   Physical Exam  Constitutional: She is oriented to person, place, and time. She appears well-developed and well-nourished.  HENT:  Head: Normocephalic and atraumatic.  Cardiovascular: Normal rate, regular rhythm and normal heart sounds.   Pulmonary/Chest: Effort normal and breath sounds normal.  Neurological: She is alert and oriented to person, place, and time.  Psychiatric: She has a normal mood and affect. Her behavior is normal.          Assessment & Plan:  Memory loss/anxiety/stress- PHQ-9 was 1. GAD-7 was 2. Much improved Memory and mood on lexapro '5mg'$  continue medication. She continues to make very good decisions with her mental health and  her family situation. Follow up in 6 months.   HTN- 2nd recheck not any better. Not taking norvasc. Restart today. Discuss importance of taking her medication daily.

## 2016-05-28 ENCOUNTER — Ambulatory Visit
Admission: RE | Admit: 2016-05-28 | Discharge: 2016-05-28 | Disposition: A | Discharge status: Home / Self Care | Payer: Medicare Other | Source: Ambulatory Visit | Attending: Physician Assistant | Admitting: Physician Assistant

## 2016-05-28 ENCOUNTER — Encounter: Payer: Self-pay | Admitting: Physician Assistant

## 2016-05-28 DIAGNOSIS — R928 Other abnormal and inconclusive findings on diagnostic imaging of breast: Secondary | ICD-10-CM

## 2016-07-30 ENCOUNTER — Ambulatory Visit (INDEPENDENT_AMBULATORY_CARE_PROVIDER_SITE_OTHER): Payer: Medicare Other | Admitting: Family Medicine

## 2016-07-30 VITALS — Temp 98.2°F

## 2016-07-30 DIAGNOSIS — Z23 Encounter for immunization: Secondary | ICD-10-CM

## 2016-07-30 MED ORDER — QUETIAPINE FUMARATE 50 MG PO TABS: 50.0000 mg | ORAL_TABLET | Freq: Every day | ORAL | 1 refills | Status: DC

## 2016-07-30 NOTE — Progress Notes (Signed)
Flu shot given

## 2016-10-06 ENCOUNTER — Encounter: Payer: Self-pay | Admitting: Family Medicine

## 2016-10-06 ENCOUNTER — Ambulatory Visit (INDEPENDENT_AMBULATORY_CARE_PROVIDER_SITE_OTHER): Payer: Medicare Other | Admitting: Family Medicine

## 2016-10-06 VITALS — BP 153/62 | HR 72 | Temp 98.2°F | Wt 138.0 lb

## 2016-10-06 DIAGNOSIS — J441 Chronic obstructive pulmonary disease with (acute) exacerbation: Secondary | ICD-10-CM

## 2016-10-06 DIAGNOSIS — J018 Other acute sinusitis: Secondary | ICD-10-CM

## 2016-10-06 MED ORDER — DOXYCYCLINE HYCLATE 100 MG PO TABS: 100.0000 mg | ORAL_TABLET | Freq: Two times a day (BID) | ORAL | 0 refills | Status: DC

## 2016-10-06 MED ORDER — PREDNISONE 20 MG PO TABS: 40.0000 mg | ORAL_TABLET | Freq: Every day | ORAL | 0 refills | Status: DC

## 2016-10-06 NOTE — Progress Notes (Signed)
Subjective:    Patient ID: [name], female    DOB: 02-26-1946, 70 y.o.   MRN: [id number]  HPI  38 you female with hx  COPD, cerebral vascular disease with chronic cough comes in today complaining of increased cough, shortness of breath, voice hoarseness and some balance issues in the last week. She says the cough is still dry and nonproductive. She says about 2 weeks ago she had more cold type symptoms and had a lot of pain in her left ear but eventually got better on its own but over the last week she's had increasing cough. She is not currently on any medications for it. He complains of a lot of patient pressure under both eyes and over her forehead.  Review of Systems     BP (!) 153/62   Pulse 72   Temp 98.2 F (36.8 C) (Oral)   Wt 138 lb (62.6 kg)   SpO2 96%   BMI 24.45 kg/m     Allergies  Allergen Reactions  . Desvenlafaxine Succinate Er Other (See Comments)    shaking  . Lisinopril-Hydrochlorothiazide     REACTION: N/V cough  . Nitrofurantoin Monohyd Macro   . Penicillins     REACTION: Hives  . Zoloft [Sertraline Hcl] Other (See Comments)    "too many side effects"    Past Medical History:  Diagnosis Date  . 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 CAD (coronary artery disease)    20% stenosis on cath - no intervention required (W-S cards), negative nuclear stress test 11/07  . Cancer [facility]) 2007   lung (Dr. Earlie Server and Dr. Arlyce Dice)  . COPD (chronic obstructive pulmonary disease) ([place]) 9/10   golds stage III, FeV1 39%  . Hurthle cell adenoma of thyroid 2007  . Hypertension   . Iron deficiency anemia 2/08   s/p 2 unit transfusion  . Lung cancer ([place])   . Peripheral vascular disease ([place])   . RLS (restless legs syndrome)     Past Surgical History:  Procedure Laterality Date  . APPENDECTOMY  18  . CHOLECYSTECTOMY  18  . heart cartherization    . LUL  2005   LUL wedge resection/VATS  . LUL  4/08   LUL lobectomy for cystic cavity and Candida, no cancer seen   . THYROIDECTOMY, PARTIAL  2007   Dr. Arlyce Dice    Social History   Social History  . Marital status: Married    Spouse name: N/A  . Number of children: N/A  . Years of education: N/A   Occupational History  . Not on file.   Social History Main Topics  . Smoking status: Former Smoker    Packs/day: 1.50    Years: 30.00    Types: Cigarettes    Quit date: 10/20/2004  . Smokeless tobacco: Never Used  . Alcohol use No  . Drug use: No  . Sexual activity: Not on file   Other Topics Concern  . Not on file   Social History Narrative  . No narrative on file    Family History  Problem Relation Age of Onset  . Cancer Mother     Larynx & Endometrial cancer  . Cancer Father     laryngeal cancer  . Hypertension Father   . Diabetes Sister   . Multiple sclerosis Sister   . Cancer Sister 71    breast    Outpatient Encounter Prescriptions as of 10/06/2016  Medication Sig  . albuterol (PROVENTIL) (2.5 MG/3ML) 0.083% nebulizer  solution Take 3 mLs (2.5 mg total) by nebulization every 6 (six) hours as needed.  . ALPRAZolam (XANAX) 1 MG tablet take 1 tablet by mouth once daily if needed  . amLODipine (NORVASC) 5 MG tablet Take 1 tablet (5 mg total) by mouth daily.  Marland Kitchen arformoterol (BROVANA) 15 MCG/2ML NEBU Take 2 mLs (15 mcg total) by nebulization 2 (two) times daily.  Marland Kitchen atorvastatin (LIPITOR) 20 MG tablet Take 1 tablet (20 mg total) by mouth at bedtime. take 1 tablet by mouth once daily  . beclomethasone (QVAR) 40 MCG/ACT inhaler Inhale 2 puffs into the lungs 2 (two) times daily.  . clopidogrel (PLAVIX) 75 MG tablet Take 1 tablet (75 mg total) by mouth daily.  Marland Kitchen escitalopram (LEXAPRO) 5 MG tablet Take 1 tablet (5 mg total) by mouth daily.  Marland Kitchen levothyroxine (SYNTHROID, LEVOTHROID) 50 MCG tablet take 1 tablet by mouth once daily  . QUEtiapine (SEROQUEL) 50 MG tablet Take 1 tablet (50 mg total) by mouth at bedtime. 1-2 tablets by mouth at bedtime.  Marland Kitchen tiotropium (SPIRIVA) 18 MCG inhalation  capsule Place 1 capsule (18 mcg total) into inhaler and inhale daily.  Marland Kitchen doxycycline (VIBRA-TABS) 100 MG tablet Take 1 tablet (100 mg total) by mouth 2 (two) times daily.  . predniSONE (DELTASONE) 20 MG tablet Take 2 tablets (40 mg total) by mouth daily.   No facility-administered encounter medications on file as of 10/06/2016.       Objective:   Physical Exam  Constitutional: She is oriented to person, place, and time. She appears well-developed and well-nourished.  HENT:  Head: Normocephalic and atraumatic.  Right Ear: External ear normal.  Left Ear: External ear normal.  Nose: Nose normal.  Mouth/Throat: Oropharynx is clear and moist.  TMs and canals are clear.   Eyes: Conjunctivae and EOM are normal. Pupils are equal, round, and reactive to light.  Neck: Neck supple. No thyromegaly present.  Cardiovascular: Normal rate, regular rhythm and normal heart sounds.   Pulmonary/Chest: Effort normal and breath sounds normal. She has no wheezes.  Lymphadenopathy:    She has no cervical adenopathy.  Neurological: She is alert and oriented to person, place, and time.  Skin: Skin is warm and dry.  Psychiatric: She has a normal mood and affect.       Assessment & Plan:  Acute sinusitis with COPD exacerbation-we'll treat with doxycycline and prednisone. Call if not better in one week. She says at one point she was on oxygen therapy at home and would like to see if she would potentially qualify for that again. Recommend that she schedule follow-up with her primary care provider in about 3 weeks when she's feeling better to do a walk test. Today her oxygen level looks fantastic at rest.  Did encourage her to follow-up with her PCP in about 3 weeks to do a walk test to see if she may qualify for oxygen.

## 2016-11-03 ENCOUNTER — Ambulatory Visit (INDEPENDENT_AMBULATORY_CARE_PROVIDER_SITE_OTHER): Payer: Medicare Other | Admitting: Physician Assistant

## 2016-11-03 ENCOUNTER — Encounter: Payer: Self-pay | Admitting: Physician Assistant

## 2016-11-03 VITALS — BP 166/67 | HR 86 | Ht 63.0 in | Wt 139.0 lb

## 2016-11-03 DIAGNOSIS — J449 Chronic obstructive pulmonary disease, unspecified: Secondary | ICD-10-CM | POA: Diagnosis not present

## 2016-11-03 MED ORDER — BECLOMETHASONE DIPROPIONATE 40 MCG/ACT IN AERS: 2.0000 | INHALATION_SPRAY | Freq: Two times a day (BID) | RESPIRATORY_TRACT | 4 refills | Status: DC

## 2016-11-03 MED ORDER — AMLODIPINE BESYLATE 10 MG PO TABS: 10.0000 mg | ORAL_TABLET | Freq: Every day | ORAL | 1 refills | Status: DC

## 2016-11-03 MED ORDER — CLOPIDOGREL BISULFATE 75 MG PO TABS: 75.0000 mg | ORAL_TABLET | Freq: Every day | ORAL | 4 refills | Status: DC

## 2016-11-03 MED ORDER — TIOTROPIUM BROMIDE MONOHYDRATE 18 MCG IN CAPS: 18.0000 ug | ORAL_CAPSULE | Freq: Every day | RESPIRATORY_TRACT | 4 refills | Status: DC

## 2016-11-03 MED ORDER — ALPRAZOLAM 1 MG PO TABS: ORAL_TABLET | ORAL | 0 refills | Status: DC

## 2016-11-03 MED ORDER — ATORVASTATIN CALCIUM 20 MG PO TABS: 20.0000 mg | ORAL_TABLET | Freq: Every day | ORAL | 4 refills | Status: DC

## 2016-11-03 MED ORDER — LEVOTHYROXINE SODIUM 50 MCG PO TABS: ORAL_TABLET | ORAL | 4 refills | Status: DC

## 2016-11-03 MED ORDER — QUETIAPINE FUMARATE 50 MG PO TABS: 50.0000 mg | ORAL_TABLET | Freq: Every day | ORAL | 1 refills | Status: DC

## 2016-11-03 NOTE — Progress Notes (Signed)
   Subjective:    Patient ID: [name], female    DOB: [birth year], 71 y.o.   MRN: [id number]  HPI  Pt is a 71 yo female who presents to the clinic for walk O2 6 minute test. At one point she had qualified for O2 at home and wonders if she would now. She feels stable today. No concerns. With exertion she does start to be come SOB but at rest no complaints.     Review of Systems  All other systems reviewed and are negative.      Objective:   Physical Exam  Constitutional: She is oriented to person, place, and time. She appears well-developed and well-nourished.  HENT:  Head: Normocephalic and atraumatic.  Cardiovascular: Normal rate, regular rhythm and normal heart sounds.   Pulmonary/Chest: Effort normal and breath sounds normal. She has no wheezes.  Neurological: She is alert and oriented to person, place, and time.  Psychiatric: She has a normal mood and affect. Her behavior is normal.          Assessment & Plan:  Marland KitchenMarland KitchenDiagnoses and all orders for this visit:  COPD with chronic bronchitis -     beclomethasone (QVAR) 40 MCG/ACT inhaler; Inhale 2 puffs into the lungs 2 (two) times daily. -     tiotropium (SPIRIVA) 18 MCG inhalation capsule; Place 1 capsule (18 mcg total) into inhaler and inhale daily.  Other orders -     amLODipine (NORVASC) 10 MG tablet; Take 1 tablet (10 mg total) by mouth daily. -     atorvastatin (LIPITOR) 20 MG tablet; Take 1 tablet (20 mg total) by mouth at bedtime. take 1 tablet by mouth once daily -     clopidogrel (PLAVIX) 75 MG tablet; Take 1 tablet (75 mg total) by mouth daily. -     levothyroxine (SYNTHROID, LEVOTHROID) 50 MCG tablet; take 1 tablet by mouth once daily -     QUEtiapine (SEROQUEL) 50 MG tablet; Take 1 tablet (50 mg total) by mouth at bedtime. 1-2 tablets by mouth at bedtime. -     ALPRAZolam (XANAX) 1 MG tablet; take 1 tablet by mouth once daily if needed   For COPD she is on LAMA, LABA, ICS.  6 minute O2 test she did not drop  below 90.  She does not qualify for home O2 at this point.  Follow up as needed.   Other medications needed were refilled.

## 2016-11-24 ENCOUNTER — Ambulatory Visit (INDEPENDENT_AMBULATORY_CARE_PROVIDER_SITE_OTHER): Payer: Medicare Other | Admitting: Physician Assistant

## 2016-11-24 VITALS — BP 152/64 | HR 84 | Temp 98.6°F | Wt 139.0 lb

## 2016-11-24 DIAGNOSIS — N3001 Acute cystitis with hematuria: Secondary | ICD-10-CM

## 2016-11-24 DIAGNOSIS — R3915 Urgency of urination: Secondary | ICD-10-CM | POA: Diagnosis not present

## 2016-11-24 LAB — POCT URINALYSIS DIPSTICK
Bilirubin, UA: NEGATIVE
Glucose, UA: NEGATIVE
Ketones, UA: NEGATIVE
Nitrite, UA: POSITIVE
Protein, UA: 30
Spec Grav, UA: 1.01
Urobilinogen, UA: 0.2
pH, UA: 5.5

## 2016-11-24 NOTE — Progress Notes (Signed)
HPI:                                                                [name] is a 71 y.o. female who presents to [address]: [place] today for dysuria  Patient reports dysuria and malodorous urine x 5 days. She denies fever, chills, nausea, vomiting, or flank pain. Endorses some lower abdominal/bladder pressure and fullness. She denies vaginal symptoms. She states she has had a UTI before and these symptoms feel the same. She denies history of nephrolithiasis.  Past Medical History:  Diagnosis Date  . 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 CAD (coronary artery disease)    20% stenosis on cath - no intervention required (W-S cards), negative nuclear stress test 11/07  . Cancer [facility]) 2007   lung (Dr. Earlie Server and Dr. Arlyce Dice)  . COPD (chronic obstructive pulmonary disease) ([place]) 9/10   golds stage III, FeV1 39%  . Hurthle cell adenoma of thyroid 2007  . Hypertension   . Iron deficiency anemia 2/08   s/p 2 unit transfusion  . Lung cancer ([place])   . Peripheral vascular disease ([place])   . RLS (restless legs syndrome)    Past Surgical History:  Procedure Laterality Date  . APPENDECTOMY  18  . CHOLECYSTECTOMY  18  . heart cartherization    . LUL  2005   LUL wedge resection/VATS  . LUL  4/08   LUL lobectomy for cystic cavity and Candida, no cancer seen  . THYROIDECTOMY, PARTIAL  2007   Dr. Arlyce Dice   Social History  Substance Use Topics  . Smoking status: Former Smoker    Packs/day: 1.50    Years: 30.00    Types: Cigarettes    Quit date: 10/20/2004  . Smokeless tobacco: Never Used  . Alcohol use No   family history includes Cancer in her father and mother; Cancer (age of onset: 47) in her sister; Diabetes in her sister; Hypertension in her father; Multiple sclerosis in her sister.  ROS: negative except as noted in the HPI  Medications: Current Outpatient Prescriptions  Medication Sig Dispense Refill  . albuterol (PROVENTIL) (2.5 MG/3ML)  0.083% nebulizer solution Take 3 mLs (2.5 mg total) by nebulization every 6 (six) hours as needed. 75 mL 4  . ALPRAZolam (XANAX) 1 MG tablet take 1 tablet by mouth once daily if needed 90 tablet 0  . amLODipine (NORVASC) 10 MG tablet Take 1 tablet (10 mg total) by mouth daily. 90 tablet 1  . arformoterol (BROVANA) 15 MCG/2ML NEBU Take 2 mLs (15 mcg total) by nebulization 2 (two) times daily. 360 mL 4  . atorvastatin (LIPITOR) 20 MG tablet Take 1 tablet (20 mg total) by mouth at bedtime. take 1 tablet by mouth once daily 90 tablet 4  . beclomethasone (QVAR) 40 MCG/ACT inhaler Inhale 2 puffs into the lungs 2 (two) times daily. 3 Inhaler 4  . clopidogrel (PLAVIX) 75 MG tablet Take 1 tablet (75 mg total) by mouth daily. 90 tablet 4  . escitalopram (LEXAPRO) 5 MG tablet Take 1 tablet (5 mg total) by mouth daily. 90 tablet 1  . levothyroxine (SYNTHROID, LEVOTHROID) 50 MCG tablet take 1 tablet by mouth once daily 90 tablet 4  . QUEtiapine (SEROQUEL) 50 MG  tablet Take 1 tablet (50 mg total) by mouth at bedtime. 1-2 tablets by mouth at bedtime. 180 tablet 1  . tiotropium (SPIRIVA) 18 MCG inhalation capsule Place 1 capsule (18 mcg total) into inhaler and inhale daily. 90 capsule 4   No current facility-administered medications for this visit.    Allergies  Allergen Reactions  . Desvenlafaxine Succinate Er Other (See Comments)    shaking  . Lisinopril-Hydrochlorothiazide     REACTION: N/V cough  . Nitrofurantoin Monohyd Macro   . Penicillins     REACTION: Hives  . Zoloft [Sertraline Hcl] Other (See Comments)    "too many side effects"       Objective:  BP (!) 152/64   Pulse 84   Temp 98.6 F (37 C) (Oral)   Wt 139 lb (63 kg)   BMI 24.62 kg/m  Gen: well-groomed, cooperative, not ill-appearing, no distress Pulm: Normal work of breathing, normal phonation, clear to auscultation bilaterally, no wheezes, rales or rhonchi CV: Normal rate, regular rhythm, s1 and s2 distinct, there is a  mid-systolic click GI: soft, nondistended, nontender, no CVA tenderness Skin: warm and dry, no rashes or lesions on exposed skin   Results for orders placed or performed in visit on 11/24/16 (from the past 72 hour(s))  POCT Urinalysis Dipstick     Status: Abnormal   Collection Time: 11/24/16 11:22 AM  Result Value Ref Range   Color, UA amber    Clarity, UA cloudy    Glucose, UA negative    Bilirubin, UA negative    Ketones, UA negative    Spec Grav, UA 1.010    Blood, UA small    pH, UA 5.5    Protein, UA 30    Urobilinogen, UA 0.2    Nitrite, UA positive    Leukocytes, UA large (3+) (A) Negative   No results found.    Assessment and Plan: 71 y.o. female with   Acute cystitis, uncomplicated - treating empirically with Keflex '500mg'$  BID x 7 days - urine culture pending   Patient education and anticipatory guidance given Patient agrees with treatment plan Follow-up as needed if symptoms worsen or fail to improve  Darlyne Russian PA-C

## 2016-11-27 LAB — URINE CULTURE

## 2016-11-28 DIAGNOSIS — R600 Localized edema: Secondary | ICD-10-CM | POA: Diagnosis not present

## 2016-11-28 DIAGNOSIS — M25572 Pain in left ankle and joints of left foot: Secondary | ICD-10-CM | POA: Diagnosis not present

## 2016-12-01 ENCOUNTER — Ambulatory Visit: Payer: Medicare Other

## 2016-12-15 ENCOUNTER — Telehealth: Payer: Self-pay

## 2016-12-15 NOTE — Telephone Encounter (Signed)
Ok to make referral to sports medicine here in office. She could just make appt. If she does not ok to make referral to [facility] [place] in [place].

## 2016-12-15 NOTE — Telephone Encounter (Signed)
Will make appt with T or Georgina Snell.

## 2016-12-16 ENCOUNTER — Ambulatory Visit (INDEPENDENT_AMBULATORY_CARE_PROVIDER_SITE_OTHER): Payer: Medicare Other | Admitting: Family Medicine

## 2016-12-16 ENCOUNTER — Encounter: Payer: Self-pay | Admitting: Family Medicine

## 2016-12-16 VITALS — BP 139/57 | HR 91 | Wt 140.0 lb

## 2016-12-16 DIAGNOSIS — R0602 Shortness of breath: Secondary | ICD-10-CM

## 2016-12-16 DIAGNOSIS — Z8781 Personal history of (healed) traumatic fracture: Secondary | ICD-10-CM | POA: Diagnosis not present

## 2016-12-16 DIAGNOSIS — R0989 Other specified symptoms and signs involving the circulatory and respiratory systems: Secondary | ICD-10-CM | POA: Diagnosis not present

## 2016-12-16 DIAGNOSIS — M79662 Pain in left lower leg: Secondary | ICD-10-CM

## 2016-12-16 DIAGNOSIS — M7989 Other specified soft tissue disorders: Secondary | ICD-10-CM | POA: Diagnosis not present

## 2016-12-16 DIAGNOSIS — G8929 Other chronic pain: Secondary | ICD-10-CM | POA: Insufficient documentation

## 2016-12-16 DIAGNOSIS — R300 Dysuria: Secondary | ICD-10-CM | POA: Diagnosis not present

## 2016-12-16 DIAGNOSIS — M25572 Pain in left ankle and joints of left foot: Secondary | ICD-10-CM

## 2016-12-16 LAB — COMPLETE METABOLIC PANEL WITH GFR
ALT: 15 U/L (ref 6–29)
AST: 19 U/L (ref 10–35)
Albumin: 4.2 g/dL (ref 3.6–5.1)
Alkaline Phosphatase: 92 U/L (ref 33–130)
BUN: 12 mg/dL (ref 7–25)
CO2: 31 mmol/L (ref 20–31)
Calcium: 10.2 mg/dL (ref 8.6–10.4)
Chloride: 99 mmol/L (ref 98–110)
Creat: 0.57 mg/dL (ref 0.50–0.99)
GFR, Est African American: >89 mL/min (ref >=60)
GFR, Est Non African American: >89 mL/min (ref >=60)
Glucose, Bld: 101 mg/dL — ABNORMAL HIGH (ref 65–99)
Potassium: 4.8 mmol/L (ref 3.5–5.3)
Sodium: 139 mmol/L (ref 135–146)
Total Bilirubin: 0.6 mg/dL (ref 0.2–1.2)
Total Protein: 6.8 g/dL (ref 6.1–8.1)

## 2016-12-16 LAB — POCT URINALYSIS DIPSTICK
Bilirubin, UA: NEGATIVE
Glucose, UA: NEGATIVE
Ketones, UA: NEGATIVE
Nitrite, UA: NEGATIVE
Protein, UA: NEGATIVE
Spec Grav, UA: <=1.005
Urobilinogen, UA: 0.2
pH, UA: 6.5

## 2016-12-16 LAB — CBC
HCT: 39.5 % (ref 35.0–45.0)
Hemoglobin: 13.2 g/dL (ref 11.7–15.5)
MCH: 30.1 pg (ref 27.0–33.0)
MCHC: 33.4 g/dL (ref 32.0–36.0)
MCV: 90 fL (ref 80.0–100.0)
MPV: 10.5 fL (ref 7.5–12.5)
Platelets: 314 10*3/uL (ref 140–400)
RBC: 4.39 MIL/uL (ref 3.80–5.10)
RDW: 13.3 % (ref 11.0–15.0)
WBC: 16 10*3/uL — ABNORMAL HIGH (ref 3.8–10.8)

## 2016-12-16 MED ORDER — CIPROFLOXACIN HCL 500 MG PO TABS: 500.0000 mg | ORAL_TABLET | Freq: Two times a day (BID) | ORAL | 0 refills | Status: DC

## 2016-12-16 NOTE — Patient Instructions (Signed)
Thank you for coming in today. Get labs today.  You should hear about the ultrasound scheduling soon.  Please let me know if you do not hear anything in by the end of the week.  We will contact you soon about the lab results.   You should also start taking the cipro antibiotic for UTI.   Call or go to the emergency room if you get worse, have trouble breathing, have chest pains, or palpitations.

## 2016-12-16 NOTE — Progress Notes (Signed)
Note faxed requested recipient via epic.

## 2016-12-16 NOTE — Progress Notes (Signed)
[name] is a 71 y.o. female who presents to [facility] [place] today for left leg swelling and dysuria.   Left leg swelling. Patient has a pertinent surgical history for left hemiarthroplasty in August 2014 by Dr Pill. The last year she's had intermittent left leg swelling which worsened about a month ago. She notes left worse than right. Left leg swelling to the mid calf. Swelling is worse after standing and better when laying. She notes some posterior ankle pain. She notes that she followed up with her orthopedic surgeon Dr. Ballard Russell in early February where she had a reportedly relatively normal x-ray of her ankle as well as a normal vascular ultrasound scan of the left leg for DVT. She denies significant chest pain or palpitations. She notes chronic occasional shortness of breath but not worse recently. She denies any significant orthopnea.  Additionally the patient notes a few days of dysuria and urinary frequency. She had similar symptoms in early February was diagnosed with UTI. Urine culture showed pansensitive Escherichia coli. She was treated empirically with Keflex 500 mg twice daily. She notes her symptoms returned a few days ago.   Past Medical History:  Diagnosis Date  . 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 CAD (coronary artery disease)    20% stenosis on cath - no intervention required (W-S cards), negative nuclear stress test 11/07  . Cancer [facility]) 2007   lung (Dr. Earlie Server and Dr. Arlyce Dice)  . COPD (chronic obstructive pulmonary disease) ([place]) 9/10   golds stage III, FeV1 39%  . Hurthle cell adenoma of thyroid 2007  . Hypertension   . Iron deficiency anemia 2/08   s/p 2 unit transfusion  . Lung cancer ([place])   . Peripheral vascular disease ([place])   . RLS (restless legs syndrome)    Past Surgical History:  Procedure Laterality Date  . APPENDECTOMY  18  .  CHOLECYSTECTOMY  18  . heart cartherization    . LUL  2005   LUL wedge resection/VATS  . LUL  4/08   LUL lobectomy for cystic cavity and Candida, no cancer seen  . THYROIDECTOMY, PARTIAL  2007   Dr. Arlyce Dice   Social History  Substance Use Topics  . Smoking status: Former Smoker    Packs/day: 1.50    Years: 30.00    Types: Cigarettes    Quit date: 10/20/2004  . Smokeless tobacco: Never Used  . Alcohol use No   family history includes Cancer in her father and mother; Cancer (age of onset: 40) in her sister; Diabetes in her sister; Hypertension in her father; Multiple sclerosis in her sister.  ROS as above:  Medications: Current Outpatient Prescriptions  Medication Sig Dispense Refill  . albuterol (PROVENTIL) (2.5 MG/3ML) 0.083% nebulizer solution Take 3 mLs (2.5 mg total) by nebulization every 6 (six) hours as needed. 75 mL 4  . ALPRAZolam (XANAX) 1 MG tablet take 1 tablet by mouth once daily if needed 90 tablet 0  . amLODipine (NORVASC) 10 MG tablet Take 1 tablet (10 mg total) by mouth daily. 90 tablet 1  . arformoterol (BROVANA) 15 MCG/2ML NEBU Take 2 mLs (15 mcg total) by nebulization 2 (two) times daily. 360 mL 4  . atorvastatin (LIPITOR) 20 MG tablet Take 1 tablet (20 mg total) by mouth at bedtime. take 1 tablet by mouth once daily 90 tablet 4  . beclomethasone (QVAR) 40 MCG/ACT inhaler Inhale 2 puffs into the lungs 2 (two)  times daily. 3 Inhaler 4  . clopidogrel (PLAVIX) 75 MG tablet Take 1 tablet (75 mg total) by mouth daily. 90 tablet 4  . escitalopram (LEXAPRO) 5 MG tablet Take 1 tablet (5 mg total) by mouth daily. 90 tablet 1  . levothyroxine (SYNTHROID, LEVOTHROID) 50 MCG tablet take 1 tablet by mouth once daily 90 tablet 4  . QUEtiapine (SEROQUEL) 50 MG tablet Take 1 tablet (50 mg total) by mouth at bedtime. 1-2 tablets by mouth at bedtime. 180 tablet 1  . tiotropium (SPIRIVA) 18 MCG inhalation capsule Place 1 capsule (18 mcg total) into inhaler and inhale daily. 90 capsule  4  . ciprofloxacin (CIPRO) 500 MG tablet Take 1 tablet (500 mg total) by mouth 2 (two) times daily. 14 tablet 0   No current facility-administered medications for this visit.    Allergies  Allergen Reactions  . Desvenlafaxine Succinate Er Other (See Comments)    shaking  . Lisinopril-Hydrochlorothiazide     REACTION: N/V cough  . Nitrofurantoin Monohyd Macro   . Penicillins     REACTION: Hives  . Zoloft [Sertraline Hcl] Other (See Comments)    "too many side effects"    Health Maintenance Health Maintenance  Topic Date Due  . Hepatitis C Screening  09/17/1946  . MAMMOGRAM  04/30/2018  . COLONOSCOPY  08/23/2019  . TETANUS/TDAP  03/03/2021  . INFLUENZA VACCINE  Completed  . DEXA SCAN  Completed  . PNA vac Low Risk Adult  Completed     Exam:  BP (!) 139/57   Pulse 91   Wt 140 lb (63.5 kg)   BMI 24.80 kg/m  Gen: Well NAD HEENT: EOMI,  MMM Lungs: Normal work of breathing. CTABL Heart: RRR no MRG Abd: NABS, Soft. Nondistended, Nontender Exts: Brisk capillary refill, warm and well perfused.  Left leg: Trace edema bilateral mid shin. No significant edema right. Pulses intact distal left foot. Sensation and capillary refill are intact. Mildly tender posterior calcaneus. Normal ankle motion without crepitation.   Limited musculoskeletal ultrasound of the left posterior ankle: Achilles tendon is intact normal-appearing no significant retrocalcaneal bursitis present. Normal-appearing bony structure.  Ultrasound leg left venous2/06/2017 [facility] Result Impression  IMPRESSION:No evidence of deep venous thrombosis.  Result Narrative  TECHNIQUE: The veins of the left lower extremity were interrogated from the visible common femoral vein to the distal popliteal vein.The junction of the greater saphenous with the common femoral vein as well as the posterior tibial veins were  evaluated.Gray scale, color, spectral, and doppler sonography was utilized. COMPARISON:  None. INDICATION: Pain in left ankle and joints of left foot  FINDINGS: Flow is present in all interrogated vessels and there are no internal echoes.Normal venous compressibility is demonstrated and there is augmentation of flow with appropriate maneuvers.  INCIDENTAL FINDINGS:[Subcutaneous edema of the ankle.    Urine culture    3wk ago  Culture ESCHERICHIA COLI   Colony Count Greater than 100,000 CFU/mL   Organism ID, Bacteria ESCHERICHIA COLI   Resulting Agency [facility]  Susceptibility    Escherichia coli    Not Specified    AMOX/CLAVULANIC <=2 "><=2  Sensitive    AMPICILLIN 4  Sensitive    AMPICILLIN/SULBACTAM <=2 "><=2  Sensitive    CEFAZOLIN <=4 "><=4  Not Reportable    CEFEPIME <=1 "><=1  Sensitive    CEFTAZIDIME <=1 "><=1  Sensitive    CEFTRIAXONE <=1 "><=1  Sensitive    CIPROFLOXACIN <=0.25 "><=0.25  Sensitive    GENTAMICIN <=1 "><=1  Sensitive  IMIPENEM <=0.25 "><=0.25  Sensitive    LEVOFLOXACIN <=0.12 "><=0.12  Sensitive    NITROFURANTOIN 32  Sensitive    PIP/TAZO <=4 "><=4  Sensitive    TOBRAMYCIN <=1 "><=1  Sensitive    TRIMETH/SULFA <=20 "><=20  Sensitive 1         1 NR=NOT REPORTABLE,SEE COMMENT ORAL therapy:A cefazolin MIC of <32 predicts  susceptibility to the oral agents cefaclor, cefdinir,cefpodoxime,cefprozil,cefuroxime, cephalexin,and loracarbef when used for therapy  of uncomplicated UTIs due to E.coli,K.pneumomiae, and P.mirabilis. PARENTERAL therapy: A cefazolin MIC of >8 indicates resistance to parenteral cefazolin. An alternate test method must be performed to confirm susceptibility to parenteral cefazolin.            Results for orders placed or performed in visit on 12/16/16 (from the past 72 hour(s))  POCT Urinalysis Dipstick     Status: Abnormal   Collection Time: 12/16/16 10:53 AM  Result Value Ref Range   Color, UA yellow    Clarity, UA clear    Glucose, UA neg    Bilirubin, UA neg    Ketones, UA neg    Spec  Grav, UA <=1.005    Blood, UA mod    pH, UA 6.5    Protein, UA neg    Urobilinogen, UA 0.2    Nitrite, UA neg    Leukocytes, UA large (3+) (A) Negative   No results found.    Assessment and Plan: 71 y.o. female with  Bilateral left worse than right leg swelling. This is concerning for kidney failure heart failure venous reflux. DVT is very unlikely based on normal DVT ultrasound study at [facility] earlier this month.  Plan for limited workup listed below consisting of CBC CMP and beta natruretic peptide. Additionally we'll obtain a venous reflux ultrasound vascular study and recheck in the near future. Recommend compression stockings. Ex  Additionally patient has dysuria. Point-of-care urinalysis is abnormal with positive leukocyte esterase. Urine culture pending. Will switch classes of antibiotics and use Cipro empirically.    Orders Placed This Encounter  Procedures  . Urine Culture  . CBC  . COMPLETE METABOLIC PANEL WITH GFR  . B Nat Peptide  . POCT Urinalysis Dipstick   Meds ordered this encounter  Medications  . ciprofloxacin (CIPRO) 500 MG tablet    Sig: Take 1 tablet (500 mg total) by mouth 2 (two) times daily.    Dispense:  14 tablet    Refill:  0     Discussed warning signs or symptoms. Please see discharge instructions. Patient expresses understanding.  I spent 40 minutes with this patient, greater than 50% was face-to-face time counseling regarding the above diagnosis.  CC: Iran Planas, PA-C (PCP) CC: Dr Pill

## 2016-12-17 LAB — BRAIN NATRIURETIC PEPTIDE: Brain Natriuretic Peptide: 64.3 pg/mL (ref <100)

## 2016-12-19 LAB — URINE CULTURE

## 2016-12-28 IMAGING — DX DG RIBS 2V*L*
2 series · 2 of 2 positions shown · non-contrast
Comparison: Chest radiographs 06/24/2013, CT chest 04/26/2014

CLINICAL DATA: LEFT lower rib pain after coughing for week,
coronary disease, hypertension, COPD, lung cancer, prior LEFT lung
surgery

EXAM:
LEFT RIBS - 2 VIEW

[rib ap]
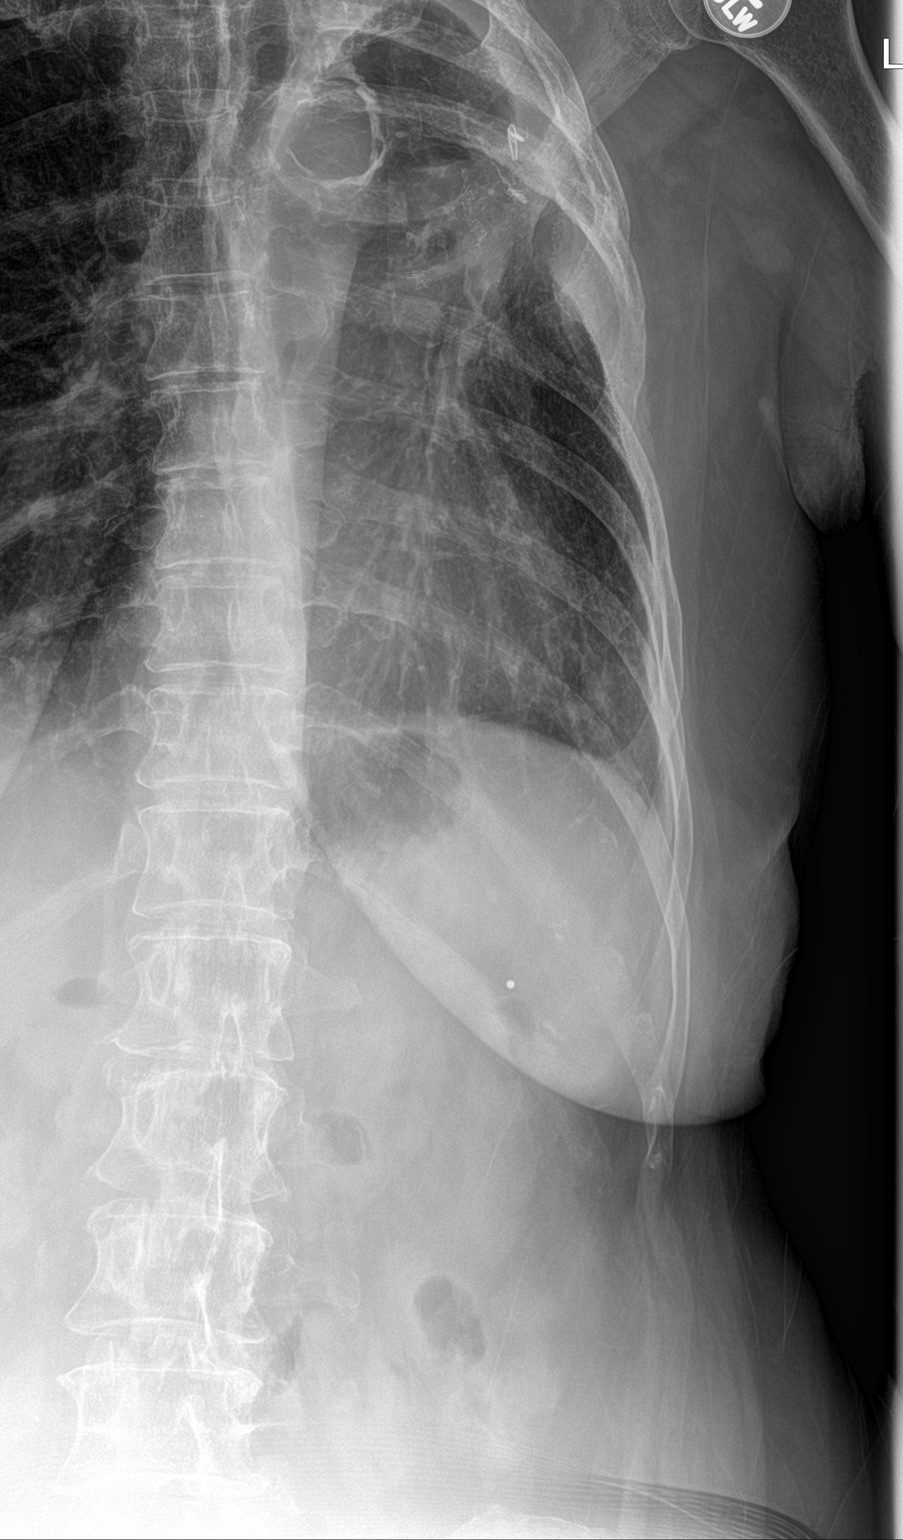

[rib ap obl]
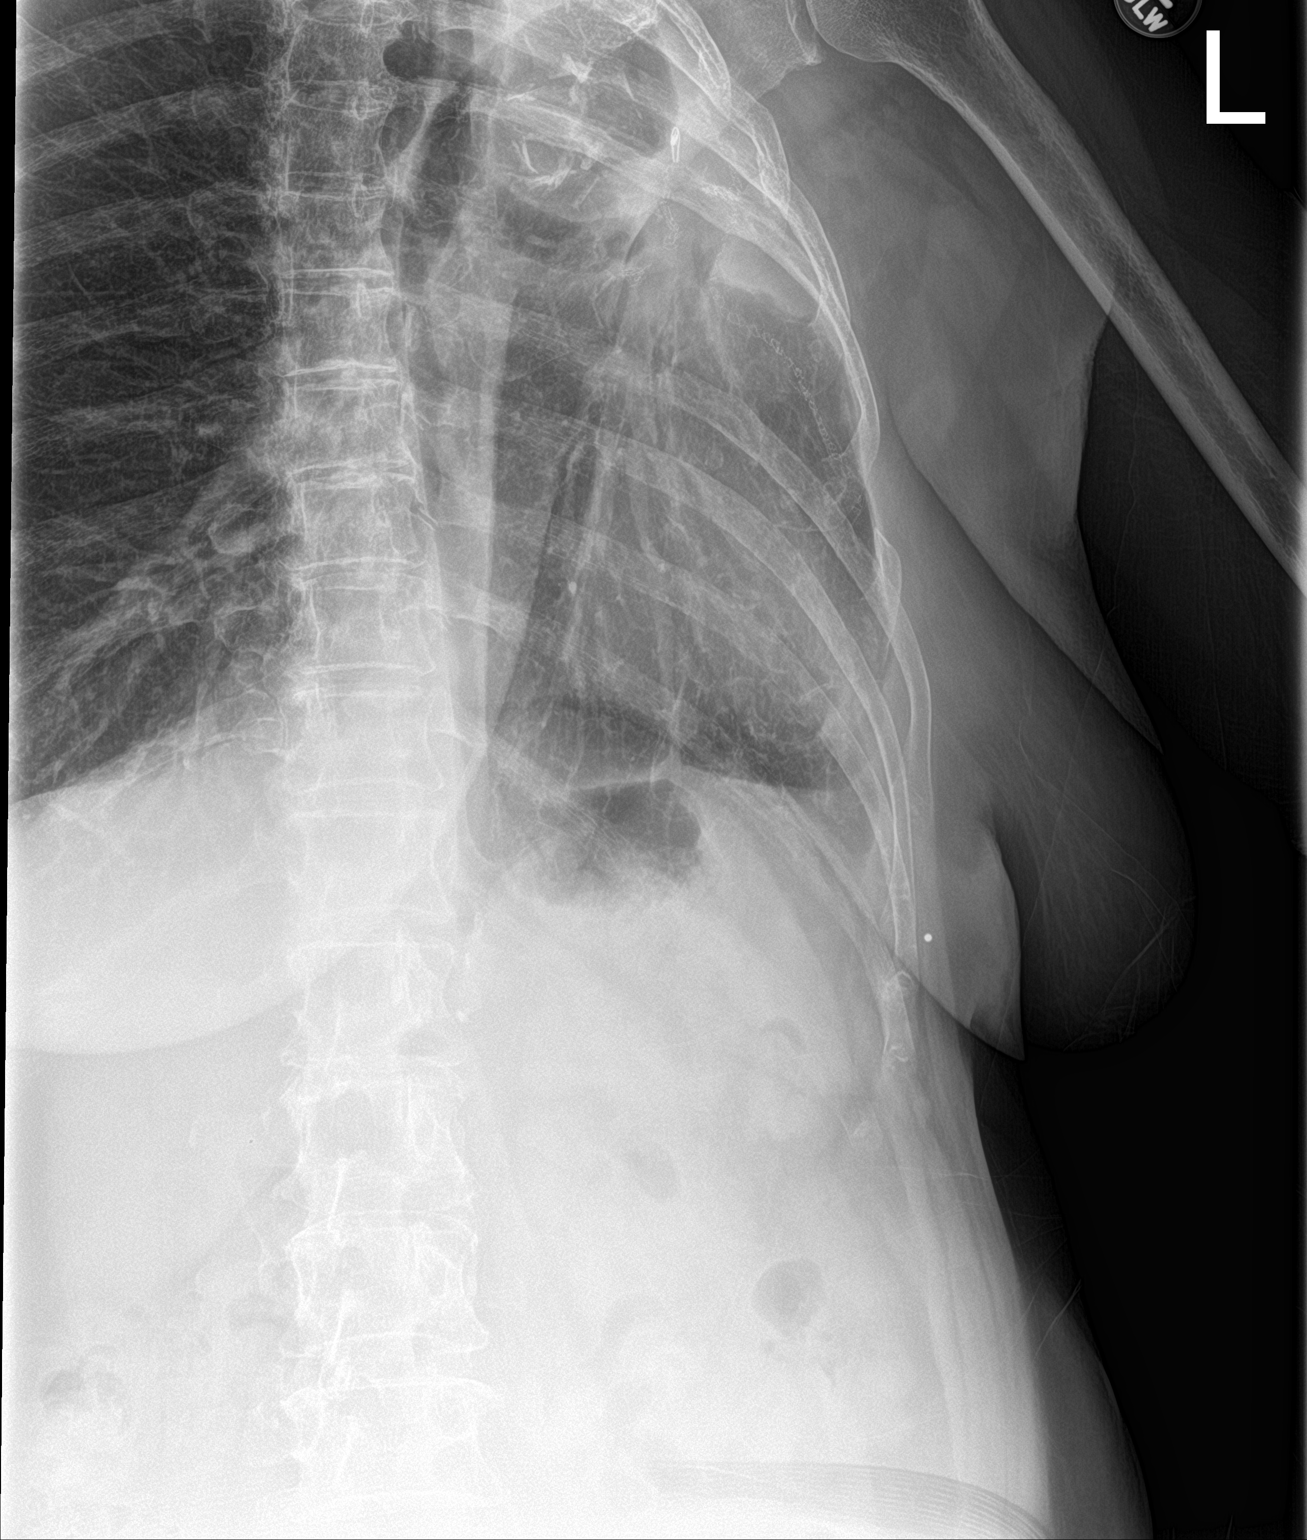

[2 of 2 positions shown; findings below may reference images not displayed]

FINDINGS: Osseous demineralization.

Prior resection of the posterior LEFT sixth rib with thoracotomy
changes and LEFT upper lobe resection.

Pleuroparenchymal thickening at the LEFT upper chest again noted.

BB placed at site of symptoms at lower LEFT chest.

No definite rib fracture or bone destruction identified.
IMPRESSION: Osseous demineralization without definite acute LEFT hip
abnormalities.

Post thoracotomy changes of the LEFT hemi thorax.

## 2017-01-19 ENCOUNTER — Other Ambulatory Visit: Payer: Self-pay | Admitting: Family Medicine

## 2017-01-19 DIAGNOSIS — M79662 Pain in left lower leg: Secondary | ICD-10-CM

## 2017-01-19 DIAGNOSIS — M7989 Other specified soft tissue disorders: Principal | ICD-10-CM

## 2017-01-20 ENCOUNTER — Inpatient Hospital Stay (HOSPITAL_COMMUNITY): Admission: RE | Admit: 2017-01-20 | Payer: Medicare Other | Source: Ambulatory Visit

## 2017-01-27 ENCOUNTER — Ambulatory Visit (HOSPITAL_COMMUNITY)
Admission: RE | Admit: 2017-01-27 | Discharge: 2017-01-27 | Disposition: A | Discharge status: Home / Self Care | Payer: Medicare Other | Source: Ambulatory Visit | Attending: Family Medicine | Admitting: Family Medicine

## 2017-01-27 DIAGNOSIS — M79662 Pain in left lower leg: Secondary | ICD-10-CM | POA: Diagnosis not present

## 2017-01-27 DIAGNOSIS — M7989 Other specified soft tissue disorders: Secondary | ICD-10-CM | POA: Diagnosis not present

## 2017-01-27 DIAGNOSIS — Z87891 Personal history of nicotine dependence: Secondary | ICD-10-CM | POA: Insufficient documentation

## 2017-01-27 DIAGNOSIS — Z8673 Personal history of transient ischemic attack (TIA), and cerebral infarction without residual deficits: Secondary | ICD-10-CM | POA: Diagnosis not present

## 2017-01-27 DIAGNOSIS — I10 Essential (primary) hypertension: Secondary | ICD-10-CM | POA: Insufficient documentation

## 2017-02-02 ENCOUNTER — Telehealth: Payer: Self-pay | Admitting: Family Medicine

## 2017-02-02 NOTE — Telephone Encounter (Signed)
At this time I am not sure. Please schedule a follow up appointment so we can continue the workup. There are plenty of tests and treatments to continue to do.

## 2017-02-02 NOTE — Telephone Encounter (Signed)
Pt advised of recommendation, verbalized understanding. She will contact office to schedule once she gets home and can see her calendar.

## 2017-02-02 NOTE — Telephone Encounter (Signed)
-----   Message from Georgiann Mccoy, [place] sent at 01/28/2017  1:23 PM EDT ----- Pt notified. She would like to know why her leg is swollen and ankle bone hurts if there are no clots. Please advise.

## 2017-02-11 DIAGNOSIS — I6523 Occlusion and stenosis of bilateral carotid arteries: Secondary | ICD-10-CM | POA: Diagnosis not present

## 2017-02-17 ENCOUNTER — Ambulatory Visit (INDEPENDENT_AMBULATORY_CARE_PROVIDER_SITE_OTHER): Payer: Medicare Other | Admitting: Family Medicine

## 2017-02-17 ENCOUNTER — Ambulatory Visit (INDEPENDENT_AMBULATORY_CARE_PROVIDER_SITE_OTHER): Payer: Medicare Other

## 2017-02-17 ENCOUNTER — Encounter: Payer: Self-pay | Admitting: Family Medicine

## 2017-02-17 VITALS — BP 158/62 | HR 88 | Wt 142.0 lb

## 2017-02-17 DIAGNOSIS — M24372 Pathological dislocation of left ankle, not elsewhere classified: Secondary | ICD-10-CM | POA: Diagnosis not present

## 2017-02-17 DIAGNOSIS — G8929 Other chronic pain: Secondary | ICD-10-CM

## 2017-02-17 DIAGNOSIS — M25572 Pain in left ankle and joints of left foot: Secondary | ICD-10-CM | POA: Diagnosis not present

## 2017-02-17 DIAGNOSIS — M79672 Pain in left foot: Secondary | ICD-10-CM | POA: Diagnosis not present

## 2017-02-17 DIAGNOSIS — S8252XA Displaced fracture of medial malleolus of left tibia, initial encounter for closed fracture: Secondary | ICD-10-CM | POA: Diagnosis not present

## 2017-02-17 HISTORY — DX: Pain in left foot: M79.672

## 2017-02-17 MED ORDER — DICLOFENAC SODIUM 1 % TD GEL: 2.0000 g | Freq: Four times a day (QID) | TRANSDERMAL | 11 refills | Status: DC

## 2017-02-17 NOTE — Patient Instructions (Addendum)
Thank you for coming in today. I recommend using an ankle sleeve while up and active.  You can use a small body helix full ankle sleeve.  I also recommend orthotics or high arch over the counter sports type insoles.  If you would like me to make a pair of custom orthotics please schedule with me for that.  Make sure to bring several shoes.   I also recommend using the voltaren gel.  You can use it up to 4x daily.

## 2017-02-17 NOTE — Progress Notes (Signed)
[name] is a 71 y.o. female who presents to [facility] today for follow-up left ankle pain and swelling. Patient has experienced several years of left ankle pain and swelling. She notes this is worse at the end of the day. She's had an extensive workup including Doppler ultrasound to confirm no DVT as well as a reflux study that did not show significant venous reflux. She notes pain at the lateral and medial ankle malleoli as well as across the dorsal midfoot and forefoot area at this is associated with swelling. Pain is moderate and interferes with activity. She will occasionally use a compressive stocking which does help some. She denies fevers or chills.   Past Medical History:  Diagnosis Date  . 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 CAD (coronary artery disease)    20% stenosis on cath - no intervention required (W-S cards), negative nuclear stress test 11/07  . Cancer [facility]) 2007   lung (Dr. Earlie Server and Dr. Arlyce Dice)  . COPD (chronic obstructive pulmonary disease) ([place]) 9/10   golds stage III, FeV1 39%  . Hurthle cell adenoma of thyroid 2007  . Hypertension   . Iron deficiency anemia 2/08   s/p 2 unit transfusion  . Lung cancer ([place])   . Peripheral vascular disease ([place])   . RLS (restless legs syndrome)    Past Surgical History:  Procedure Laterality Date  . APPENDECTOMY  18  . CHOLECYSTECTOMY  18  . heart cartherization    . LUL  2005   LUL wedge resection/VATS  . LUL  4/08   LUL lobectomy for cystic cavity and Candida, no cancer seen  . THYROIDECTOMY, PARTIAL  2007   Dr. Arlyce Dice   Social History  Substance Use Topics  . Smoking status: Former Smoker    Packs/day: 1.50    Years: 30.00    Types: Cigarettes    Quit date: 10/20/2004  . Smokeless tobacco: Never Used  . Alcohol use No     ROS:  As above   Medications: Current Outpatient Prescriptions  Medication Sig Dispense Refill  . albuterol (PROVENTIL) (2.5 MG/3ML)  0.083% nebulizer solution Take 3 mLs (2.5 mg total) by nebulization every 6 (six) hours as needed. 75 mL 4  . ALPRAZolam (XANAX) 1 MG tablet take 1 tablet by mouth once daily if needed 90 tablet 0  . amLODipine (NORVASC) 10 MG tablet Take 1 tablet (10 mg total) by mouth daily. 90 tablet 1  . arformoterol (BROVANA) 15 MCG/2ML NEBU Take 2 mLs (15 mcg total) by nebulization 2 (two) times daily. 360 mL 4  . atorvastatin (LIPITOR) 20 MG tablet Take 1 tablet (20 mg total) by mouth at bedtime. take 1 tablet by mouth once daily 90 tablet 4  . beclomethasone (QVAR) 40 MCG/ACT inhaler Inhale 2 puffs into the lungs 2 (two) times daily. 3 Inhaler 4  . ciprofloxacin (CIPRO) 500 MG tablet Take 1 tablet (500 mg total) by mouth 2 (two) times daily. 14 tablet 0  . clopidogrel (PLAVIX) 75 MG tablet Take 1 tablet (75 mg total) by mouth daily. 90 tablet 4  . escitalopram (LEXAPRO) 5 MG tablet Take 1 tablet (5 mg total) by mouth daily. 90 tablet 1  . levothyroxine (SYNTHROID, LEVOTHROID) 50 MCG tablet take 1 tablet by mouth once daily 90 tablet 4  . QUEtiapine (SEROQUEL) 50 MG tablet Take 1 tablet (50 mg total) by mouth at bedtime. 1-2 tablets by mouth at bedtime. 180 tablet 1  .  tiotropium (SPIRIVA) 18 MCG inhalation capsule Place 1 capsule (18 mcg total) into inhaler and inhale daily. 90 capsule 4  . diclofenac sodium (VOLTAREN) 1 % GEL Apply 2 g topically 4 (four) times daily. To affected joint. 100 g 11   No current facility-administered medications for this visit.    Allergies  Allergen Reactions  . Desvenlafaxine Succinate Er Other (See Comments)    shaking  . Lisinopril-Hydrochlorothiazide     REACTION: N/V cough  . Nitrofurantoin Monohyd Macro   . Penicillins     REACTION: Hives  . Zoloft [Sertraline Hcl] Other (See Comments)    "too many side effects"     Exam:  BP (!) 158/62   Pulse 88   Wt 142 lb (64.4 kg)   BMI 25.15 kg/m  General: Well Developed, well nourished, and in no acute distress.    Neuro/Psych: Alert and oriented x3, extra-ocular muscles intact, able to move all 4 extremities, sensation grossly intact. Skin: Warm and dry, no rashes noted.  Respiratory: Not using accessory muscles, speaking in full sentences, trachea midline.  Cardiovascular: Pulses palpable, no extremity edema. Abdomen: Does not appear distended. MSK: Left ankle and foot are largely normal appearing with no significant deformities degenerative changes or effusion. The lateral medial malleoli are mildly tender. The ankle and foot have normal motion and stability. She has some pain with resisted foot eversion. Pulses capillary refill and sensation are intact distally.    No results found for this or any previous visit (from the past 48 hour(s)). Dg Ankle Complete Left  Result Date: 02/17/2017 CLINICAL DATA:  Ankle pain.  No injury. EXAM: LEFT ANKLE COMPLETE - 3+ VIEW COMPARISON:  No recent prior. FINDINGS: Soft tissue swelling.Tiny avulsion fracture at the tip of the medial malleolus cannot be excluded. IMPRESSION: Tiny avulsion fracture at the tip of the medial malleolus. Electronically Signed   By: Marcello Moores  Register   On: 02/17/2017 10:53   Dg Foot Complete Left  Result Date: 02/17/2017 CLINICAL DATA:  Foot pain over third, fourth, fifth metatarsals. No injury. EXAM: LEFT FOOT - COMPLETE 3+ VIEW COMPARISON:  No recent prior . FINDINGS: No acute bony or joint abnormality identified. No evidence of fracture or dislocation. IMPRESSION: No acute or focal abnormality. Electronically Signed   By: Marcello Moores  Register   On: 02/17/2017 10:51      Assessment and Plan: 71 y.o. female with left foot and ankle pain. Patient does not have a significant amount of degenerative changes seen on x-ray today. She does have a tiny avulsion flag which I think is unlikely to be a cause of her pain. We discussed options. Plan for compressive ankle sleeve and orthotics as well as diclofenac gel. We'll follow-up in the near  future.    Orders Placed This Encounter  Procedures  . DG Foot Complete Left    Standing Status:   Future    Number of Occurrences:   1    Standing Expiration Date:   04/19/2018    Order Specific Question:   Reason for Exam (SYMPTOM  OR DIAGNOSIS REQUIRED)    Answer:   eval pain    Order Specific Question:   Preferred imaging location?    Answer:   [facility]    Order Specific Question:   Radiology Contrast Protocol - do NOT remove file path    Answer:   \\[email or web address]  . DG Ankle Complete Left    Standing Status:   Future    Number of Occurrences:  1    Standing Expiration Date:   04/19/2018    Order Specific Question:   Reason for Exam (SYMPTOM  OR DIAGNOSIS REQUIRED)    Answer:   left ankle pain    Order Specific Question:   Preferred imaging location?    Answer:   [facility]    Order Specific Question:   Radiology Contrast Protocol - do NOT remove file path    Answer:   \\[email or web address]    Discussed warning signs or symptoms. Please see discharge instructions. Patient expresses understanding.

## 2017-02-19 ENCOUNTER — Encounter: Payer: Self-pay | Admitting: Family Medicine

## 2017-02-19 ENCOUNTER — Ambulatory Visit (INDEPENDENT_AMBULATORY_CARE_PROVIDER_SITE_OTHER): Payer: Medicare Other | Admitting: Family Medicine

## 2017-02-19 VITALS — BP 144/63 | HR 88 | Ht 63.0 in | Wt 142.0 lb

## 2017-02-19 DIAGNOSIS — M79672 Pain in left foot: Secondary | ICD-10-CM | POA: Diagnosis not present

## 2017-02-19 NOTE — Progress Notes (Signed)
    Orthotics Note:   Patient was fitted for a : standard, cushioned, semi-rigid orthotic. The orthotic was heated and afterward the patient stood on the orthotic blank positioned on the orthotic stand. The patient was positioned in subtalar neutral position and 10 degrees of ankle dorsiflexion in a weight bearing stance. After completion of molding, a stable base was applied to the orthotic blank. The blank was ground to a stable position for weight bearing. Size: 9 Base: [employer] and safety inspector and Padding: None The patient ambulated these, and they were very comfortable.  I spent 40 minutes with this patient, greater than 50% was face-to-face time counseling regarding the below diagnosis.

## 2017-02-23 DIAGNOSIS — G459 Transient cerebral ischemic attack, unspecified: Secondary | ICD-10-CM | POA: Diagnosis not present

## 2017-03-23 ENCOUNTER — Other Ambulatory Visit: Payer: Self-pay | Admitting: Physician Assistant

## 2017-04-28 ENCOUNTER — Ambulatory Visit (INDEPENDENT_AMBULATORY_CARE_PROVIDER_SITE_OTHER): Payer: Medicare Other | Admitting: Family Medicine

## 2017-04-28 ENCOUNTER — Encounter: Payer: Self-pay | Admitting: Family Medicine

## 2017-04-28 ENCOUNTER — Telehealth: Payer: Self-pay | Admitting: Family Medicine

## 2017-04-28 VITALS — BP 151/60 | HR 94 | Ht 63.0 in | Wt 139.0 lb

## 2017-04-28 DIAGNOSIS — I10 Essential (primary) hypertension: Secondary | ICD-10-CM

## 2017-04-28 DIAGNOSIS — J449 Chronic obstructive pulmonary disease, unspecified: Secondary | ICD-10-CM

## 2017-04-28 DIAGNOSIS — F418 Other specified anxiety disorders: Secondary | ICD-10-CM | POA: Diagnosis not present

## 2017-04-28 DIAGNOSIS — E039 Hypothyroidism, unspecified: Secondary | ICD-10-CM

## 2017-04-28 DIAGNOSIS — R300 Dysuria: Secondary | ICD-10-CM | POA: Diagnosis not present

## 2017-04-28 DIAGNOSIS — Z1159 Encounter for screening for other viral diseases: Secondary | ICD-10-CM

## 2017-04-28 DIAGNOSIS — M216X9 Other acquired deformities of unspecified foot: Secondary | ICD-10-CM

## 2017-04-28 HISTORY — DX: Other acquired deformities of unspecified foot: M21.6X9

## 2017-04-28 LAB — POCT URINALYSIS DIPSTICK
Bilirubin, UA: NEGATIVE
Blood, UA: NEGATIVE
Glucose, UA: NEGATIVE
Ketones, UA: NEGATIVE
Leukocytes, UA: NEGATIVE
Nitrite, UA: NEGATIVE
Protein, UA: NEGATIVE
Spec Grav, UA: <=1.005 — AB (ref 1.010–1.025)
Urobilinogen, UA: 0.2 E.U./dL
pH, UA: 6 (ref 5.0–8.0)

## 2017-04-28 MED ORDER — ATORVASTATIN CALCIUM 20 MG PO TABS: 20.0000 mg | ORAL_TABLET | Freq: Every day | ORAL | 4 refills | Status: DC

## 2017-04-28 MED ORDER — QUETIAPINE FUMARATE 100 MG PO TABS: 100.0000 mg | ORAL_TABLET | Freq: Every day | ORAL | 1 refills | Status: DC

## 2017-04-28 MED ORDER — LEVOTHYROXINE SODIUM 50 MCG PO TABS
ORAL_TABLET | ORAL | 1 refills | Status: DC
Start: 2017-04-28 — End: 2018-02-03

## 2017-04-28 MED ORDER — AMLODIPINE BESYLATE 10 MG PO TABS: 10.0000 mg | ORAL_TABLET | Freq: Every day | ORAL | 1 refills | Status: DC

## 2017-04-28 MED ORDER — ALPRAZOLAM 1 MG PO TABS: 1.0000 mg | ORAL_TABLET | Freq: Every day | ORAL | 1 refills | Status: DC | PRN

## 2017-04-28 MED ORDER — ESCITALOPRAM OXALATE 5 MG PO TABS: 5.0000 mg | ORAL_TABLET | Freq: Every day | ORAL | 1 refills | Status: DC

## 2017-04-28 NOTE — Patient Instructions (Signed)
Thank you for coming in today. Recheck as needed.  Use the small scaphoid pads.  You can get them from [email or web address]

## 2017-04-28 NOTE — Telephone Encounter (Signed)
Call pt:  Blood pressure was still elevated when she left today. I like for her to come back in early next week for nurse visit to recheck her pressure. It may be that the amlodipine is not completely controlling her pressure.
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
## 2017-04-28 NOTE — Progress Notes (Signed)
.   Patient returns to clinic today noting that the orthotics that were made in May are not fitting correctly. The right orthotic especially does not have a high arch. A new right sided size 9 orthotic was produced with a scaphoid pads. Patient was satisfied with the orthotics.  No Charge

## 2017-04-29 LAB — HEPATITIS C ANTIBODY: HCV Ab: NEGATIVE

## 2017-04-29 LAB — COMPLETE METABOLIC PANEL WITH GFR
ALT: 13 U/L (ref 6–29)
AST: 19 U/L (ref 10–35)
Albumin: 4 g/dL (ref 3.6–5.1)
Alkaline Phosphatase: 101 U/L (ref 33–130)
BUN: 12 mg/dL (ref 7–25)
CO2: 23 mmol/L (ref 20–31)
Calcium: 9 mg/dL (ref 8.6–10.4)
Chloride: 101 mmol/L (ref 98–110)
Creat: 0.62 mg/dL (ref 0.60–0.93)
GFR, Est African American: >89 mL/min (ref >=60)
GFR, Est Non African American: >89 mL/min (ref >=60)
Glucose, Bld: 94 mg/dL (ref 65–99)
Potassium: 3.6 mmol/L (ref 3.5–5.3)
Sodium: 138 mmol/L (ref 135–146)
Total Bilirubin: 0.4 mg/dL (ref 0.2–1.2)
Total Protein: 6.4 g/dL (ref 6.1–8.1)

## 2017-04-29 LAB — LIPID PANEL
Cholesterol: 151 mg/dL (ref <200)
HDL: 92 mg/dL (ref >50)
LDL Cholesterol: 48 mg/dL (ref <100)
Total CHOL/HDL Ratio: 1.6 Ratio (ref <5.0)
Triglycerides: 54 mg/dL (ref <150)
VLDL: 11 mg/dL (ref <30)

## 2017-04-29 LAB — TSH: TSH: 0.99 mIU/L

## 2017-04-29 NOTE — Telephone Encounter (Signed)
Called pt and advised of recommendations. She will call back and schedule an appointment.[name] \

## 2017-05-19 ENCOUNTER — Ambulatory Visit (INDEPENDENT_AMBULATORY_CARE_PROVIDER_SITE_OTHER): Payer: Medicare Other | Admitting: Pulmonary Disease

## 2017-05-19 ENCOUNTER — Ambulatory Visit (HOSPITAL_BASED_OUTPATIENT_CLINIC_OR_DEPARTMENT_OTHER)
Admission: RE | Admit: 2017-05-19 | Discharge: 2017-05-19 | Disposition: A | Discharge status: Home / Self Care | Payer: Medicare Other | Source: Ambulatory Visit | Attending: Pulmonary Disease | Admitting: Pulmonary Disease

## 2017-05-19 ENCOUNTER — Encounter: Payer: Self-pay | Admitting: Pulmonary Disease

## 2017-05-19 VITALS — BP 118/60 | HR 95 | Ht 63.0 in | Wt 138.4 lb

## 2017-05-19 DIAGNOSIS — R911 Solitary pulmonary nodule: Secondary | ICD-10-CM

## 2017-05-19 DIAGNOSIS — Z902 Acquired absence of lung [part of]: Secondary | ICD-10-CM | POA: Insufficient documentation

## 2017-05-19 DIAGNOSIS — I2510 Atherosclerotic heart disease of native coronary artery without angina pectoris: Secondary | ICD-10-CM | POA: Diagnosis not present

## 2017-05-19 DIAGNOSIS — I700 Atherosclerosis of aorta: Secondary | ICD-10-CM | POA: Diagnosis not present

## 2017-05-19 DIAGNOSIS — J439 Emphysema, unspecified: Secondary | ICD-10-CM | POA: Diagnosis not present

## 2017-05-19 DIAGNOSIS — J449 Chronic obstructive pulmonary disease, unspecified: Secondary | ICD-10-CM | POA: Diagnosis not present

## 2017-05-19 DIAGNOSIS — R918 Other nonspecific abnormal finding of lung field: Secondary | ICD-10-CM | POA: Diagnosis not present

## 2017-05-19 MED ORDER — TIOTROPIUM BROMIDE MONOHYDRATE 18 MCG IN CAPS: 18.0000 ug | ORAL_CAPSULE | Freq: Every day | RESPIRATORY_TRACT | 4 refills | Status: DC

## 2017-05-19 MED ORDER — ARFORMOTEROL TARTRATE 15 MCG/2ML IN NEBU: 15.0000 ug | INHALATION_SOLUTION | Freq: Two times a day (BID) | RESPIRATORY_TRACT | 4 refills | Status: DC

## 2017-05-19 NOTE — Progress Notes (Signed)
   Subjective:    Patient ID: [name], female    DOB: 12-Jul-1946, 71 y.o.   MRN: [id number]  HPI  71 yo female former smoker with Severe COPD and previous lung cancer s/p LUL resection /seed implantation  in 2005 with [place]   Chief Complaint  Patient presents with  . Follow-up    Pt states her breathing has remained unchanged, she is still experiencing increased sob with exertion, she still has a slight non productive cough, Pt states she was suppose to have a repeat CT scan every year but has not had one yet     Annual follow-up Her breathing is at baseline, oxygen saturation at 95% today She denies cough or wheezing or sputum production She remains compliant with her regimen of Spiriva and Brovana - this has worked well for her and she does not want to change to a combination  She has questions about change of Qvar device She would also like an albuterol MDI  I reviewed her prior surveillance annual scans dating back to 2014  Significant tests/ events reviewed  Spiro Aug 2014: feV1 28% CT chest 04/2015 with no change, no evidence of recurrence.   Review of Systems  neg for any significant sore throat, dysphagia, itching, sneezing, nasal congestion or excess/ purulent secretions, fever, chills, sweats, unintended wt loss, pleuritic or exertional cp, hempoptysis, orthopnea pnd or change in chronic leg swelling. Also denies presyncope, palpitations, heartburn, abdominal pain, nausea, vomiting, diarrhea or change in bowel or urinary habits, dysuria,hematuria, rash, arthralgias, visual complaints, headache, numbness weakness or ataxia.     Objective:   Physical Exam   Gen. Pleasant, well-nourished, in no distress ENT - no thrush, no post nasal drip Neck: No JVD, no thyromegaly, no carotid bruits Lungs: no use of accessory muscles, no dullness to percussion, decreased without rales or rhonchi  Cardiovascular: Rhythm regular, heart sounds  normal, no murmurs or gallops, no  peripheral edema Musculoskeletal: No deformities, no cyanosis or clubbing         Assessment & Plan:

## 2017-05-19 NOTE — Assessment & Plan Note (Addendum)
Schedule CT chest without contrast -annual surveillance and follow-up nodules

## 2017-05-19 NOTE — Assessment & Plan Note (Signed)
Refills on Brovana, Spiriva Okay to stay on Qvar. Prescription for albuterol MDI 2 puffs every 6 hours as needed for wheezing

## 2017-05-19 NOTE — Patient Instructions (Signed)
Schedule CT chest without contrast Refills on Brovana, Spiriva Okay to stay on Qvar. Prescription for albuterol MDI 2 puffs every 6 hours as needed for wheezing

## 2017-06-09 ENCOUNTER — Telehealth: Payer: Self-pay | Admitting: Pulmonary Disease

## 2017-06-09 DIAGNOSIS — J449 Chronic obstructive pulmonary disease, unspecified: Secondary | ICD-10-CM

## 2017-06-09 MED ORDER — ALBUTEROL SULFATE (2.5 MG/3ML) 0.083% IN NEBU: 2.5000 mg | INHALATION_SOLUTION | Freq: Four times a day (QID) | RESPIRATORY_TRACT | 3 refills | Status: DC | PRN

## 2017-06-09 MED ORDER — ALBUTEROL SULFATE HFA 108 (90 BASE) MCG/ACT IN AERS: 2.0000 | INHALATION_SPRAY | Freq: Four times a day (QID) | RESPIRATORY_TRACT | 3 refills | Status: DC | PRN

## 2017-06-09 MED ORDER — BECLOMETHASONE DIPROPIONATE 40 MCG/ACT IN AERS: 2.0000 | INHALATION_SPRAY | Freq: Two times a day (BID) | RESPIRATORY_TRACT | 3 refills | Status: DC

## 2017-06-09 NOTE — Telephone Encounter (Signed)
Spoke with pt. She is needing refills on Albuterol HFA and neb, Qvar and Spiriva. Advised her that Grandfather were sent in on 05/19/17. Pt was very rude and wanted to know what RA sent those meds in but didn't sent in the rest. I advised her that I could not answer that question for her. All other prescriptions have been sent in. Nothing further was needed.

## 2017-06-23 ENCOUNTER — Telehealth: Payer: Self-pay | Admitting: Pulmonary Disease

## 2017-06-23 DIAGNOSIS — J449 Chronic obstructive pulmonary disease, unspecified: Secondary | ICD-10-CM

## 2017-06-23 MED ORDER — ALBUTEROL SULFATE (2.5 MG/3ML) 0.083% IN NEBU: 2.5000 mg | INHALATION_SOLUTION | Freq: Four times a day (QID) | RESPIRATORY_TRACT | 3 refills | Status: DC | PRN

## 2017-06-23 MED ORDER — ALBUTEROL SULFATE HFA 108 (90 BASE) MCG/ACT IN AERS: 2.0000 | INHALATION_SPRAY | Freq: Four times a day (QID) | RESPIRATORY_TRACT | 3 refills | Status: DC | PRN

## 2017-06-23 MED ORDER — BECLOMETHASONE DIPROP HFA 40 MCG/ACT IN AERB: 2.0000 | INHALATION_SPRAY | Freq: Two times a day (BID) | RESPIRATORY_TRACT | 3 refills | Status: DC

## 2017-06-23 MED ORDER — TIOTROPIUM BROMIDE MONOHYDRATE 18 MCG IN CAPS: 18.0000 ug | ORAL_CAPSULE | Freq: Every day | RESPIRATORY_TRACT | 4 refills | Status: DC

## 2017-06-23 NOTE — Telephone Encounter (Signed)
Patient request for nurse to give her a call on cell phone [phone]

## 2017-06-23 NOTE — Telephone Encounter (Signed)
Medications have been sent in to the correct pharmacy.

## 2017-06-23 NOTE — Telephone Encounter (Signed)
Spoke with patient. She stated that she had been giving 3 different addresses for her RX refills to be sent. I called [facility] in [place] to see where exactly her RXs needed to go, rep stated that they had received the RXs and that they will be mailed out today. She stated her RXs can go to the [place] location for future reference.   Explained to patient what MBM said. She still insisted that her medications come from [place]. Advised her that per [facility], they do not. Once I told her that her meds had been shipped, she seemed to be satisfied.   Nothing else was needed at time of all.

## 2017-07-29 ENCOUNTER — Ambulatory Visit (INDEPENDENT_AMBULATORY_CARE_PROVIDER_SITE_OTHER): Payer: Medicare Other | Admitting: Physician Assistant

## 2017-07-29 ENCOUNTER — Encounter: Payer: Self-pay | Admitting: Physician Assistant

## 2017-07-29 VITALS — BP 158/72 | HR 98 | Ht 63.0 in | Wt 139.0 lb

## 2017-07-29 DIAGNOSIS — R0602 Shortness of breath: Secondary | ICD-10-CM

## 2017-07-29 DIAGNOSIS — Z23 Encounter for immunization: Secondary | ICD-10-CM

## 2017-07-29 DIAGNOSIS — R413 Other amnesia: Secondary | ICD-10-CM

## 2017-07-29 DIAGNOSIS — F418 Other specified anxiety disorders: Secondary | ICD-10-CM

## 2017-07-29 DIAGNOSIS — J449 Chronic obstructive pulmonary disease, unspecified: Secondary | ICD-10-CM

## 2017-07-29 DIAGNOSIS — C3412 Malignant neoplasm of upper lobe, left bronchus or lung: Secondary | ICD-10-CM | POA: Diagnosis not present

## 2017-07-29 DIAGNOSIS — R011 Cardiac murmur, unspecified: Secondary | ICD-10-CM | POA: Diagnosis not present

## 2017-07-29 MED ORDER — VORTIOXETINE HBR 5 MG PO TABS: 1.0000 | ORAL_TABLET | Freq: Every day | ORAL | 0 refills | Status: DC

## 2017-07-29 MED ORDER — ALPRAZOLAM 1 MG PO TABS: 1.0000 mg | ORAL_TABLET | Freq: Every day | ORAL | 1 refills | Status: DC | PRN

## 2017-07-29 NOTE — Progress Notes (Signed)
Subjective:    Patient ID: [name], female    DOB: 04/05/1946, 71 y.o.   MRN: [id number]  HPI Pt is a 71 yo pleasant female with hx of lung cancer and left upper lobe resection, COPD she presents with worsening SOB. She feels like lately mutliple activities make her SOB. She also has a murmur. Last echo 2014. She sees pulmonology Dr. Elsworth Soho. She is not happy with her care. She is on ICS, LABA, and LAMA and very compliant. She denies any swelling of lower extremities. No fever, chills.   For the past 2 years we have been working up her memory concerns. All of her cognitive testing is fairly normal and CT of head normal as well. She continues to have lots of stress surrounding her daughter who was kicked out of her house and not in a mental health facilty because she was trying to kill herself. She is raising her grandson.   .. Active Ambulatory Problems    Diagnosis Date Noted  . History of lung cancer 07/28/2006  . HYPOTHYROIDISM, POSTSURGICAL 05/19/2007  . HYPERCHOLESTEROLEMIA 07/28/2006  . ANEMIA, IRON DEFICIENCY NOS 01/04/2007  . Major depressive disorder, recurrent episode ([place]) 07/28/2006  . Anxiety state 07/28/2006  . HYPERTENSION, BENIGN SYSTEMIC 07/28/2006  . DISEASE, ISCHEMIC HEART, CHRONIC NOS 05/19/2007  . PERIPHERAL VASCULAR DISEASE, UNSPEC. 07/28/2006  . COPD with chronic bronchitis 07/28/2006  . Hypoxemia 06/22/2013  . History of hip fracture 09/02/2013  . Insomnia 09/04/2013  . GERD (gastroesophageal reflux disease) 09/04/2013  . Sensorineural hearing loss of both ears 11/18/2013  . Systolic murmur 67/89/3810  . Episode of abnormal behavior 11/28/2013  . Word finding difficulty 11/28/2013  . Memory loss 11/28/2013  . Transient cerebral ischemic attack 03/01/2014  . Carotid artery narrowing 02/27/2014  . Clinical depression 02/27/2014  . Cerebrovascular accident, old 06/14/2013  . IFG (impaired fasting glucose) 04/12/2015  . Elevated blood pressure 10/16/2015   . Split S2 (second heart sound) 10/16/2015  . Anxiety as acute reaction to exceptional stress 12/05/2015  . Muscle spasm of back 12/05/2015  . Memory changes 12/05/2015  . Osteopenia 04/30/2016  . Chronic pain of left ankle 12/16/2016  . Left foot pain 02/17/2017  . Cavus foot, acquired 04/28/2017  . Depression with anxiety 08/01/2017  . Lung cancer ([place]) 08/01/2017   Resolved Ambulatory Problems    Diagnosis Date Noted  . THRUSH 09/26/2010  . Abdominal bloating 03/04/2011  .  Chronic Bronchopleural fistula    Past Medical History:  Diagnosis Date  . 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 CAD (coronary artery disease)   . Cancer ([place]) 2007  . COPD (chronic obstructive pulmonary disease) ([place]) 9/10  . Hurthle cell adenoma of thyroid 2007  . Hypertension   . Iron deficiency anemia 2/08  . Lung cancer ([place])   . Peripheral vascular disease ([place])   . RLS (restless legs syndrome)    .Marland Kitchen Family History  Problem Relation Age of Onset  . Cancer Mother        Larynx & Endometrial cancer  . Cancer Father        laryngeal cancer  . Hypertension Father   . Diabetes Sister   . Multiple sclerosis Sister   . Cancer Sister 16       breast      Review of Systems  All other systems reviewed and are negative.      Objective:   Physical Exam  Constitutional: She is oriented to person, place, and time.  She appears well-developed and well-nourished.  HENT:  Head: Normocephalic and atraumatic.  Neck: Normal range of motion. Neck supple.  Cardiovascular: Normal rate and regular rhythm.   2/6 SEM.   Pulmonary/Chest: Effort normal and breath sounds normal. She has no wheezes.  Absent breath sounds over left upper lung  Lymphadenopathy:    She has no cervical adenopathy.  Neurological: She is alert and oriented to person, place, and time.  Psychiatric: She has a normal mood and affect. Her behavior is normal.          Assessment & Plan:  Marland KitchenMarland KitchenDiagnoses and all orders for this  visit:  SOB (shortness of breath) -     Ambulatory referral to Pulmonology -     ECHOCARDIOGRAM COMPLETE; Future  Depression with anxiety -     vortioxetine HBr (TRINTELLIX) 5 MG TABS; Take 1 tablet (5 mg total) by mouth daily. -     ALPRAZolam (XANAX) 1 MG tablet; Take 1 tablet (1 mg total) by mouth daily as needed for anxiety.  Memory changes -     vortioxetine HBr (TRINTELLIX) 5 MG TABS; Take 1 tablet (5 mg total) by mouth daily.  Influenza vaccine needed -     Flu Vaccine QUAD 6+ mos PF IM (Fluarix Quad PF)  COPD with chronic bronchitis -     Ambulatory referral to Pulmonology  Malignant neoplasm of upper lobe of left lung ([place]) -     Ambulatory referral to Pulmonology  Systolic murmur -     ECHOCARDIOGRAM COMPLETE; Future     .Marland Kitchen Depression screen [facility] 2/9 07/29/2017 04/28/2017 05/27/2016 03/15/2015  Decreased Interest 0 0 0 0  Down, Depressed, Hopeless 0 3 0 0  PHQ - 2 Score 0 3 0 0  Altered sleeping 0 0 0 -  Tired, decreased energy 0 1 0 -  Change in appetite 0 0 0 -  Feeling bad or failure about yourself  0 3 0 -  Trouble concentrating 0 3 1 -  Moving slowly or fidgety/restless 0 3 0 -  Suicidal thoughts 0 0 0 -  PHQ-9 Score 0 13 1 -  Difficult doing work/chores Not difficult at all - - -    GAD 7 : Generalized Anxiety Score 07/29/2017 07/29/2017 04/28/2017 05/27/2016  Nervous, Anxious, on Edge 3 3 3  0  Control/stop worrying 3 3 0 0  Worry too much - different things 3 3 3 1   Trouble relaxing 2 2 3  0  Restless 3 3 3  0  Easily annoyed or irritable 3 3 3  0  Afraid - awful might happen 3 3 3  0  Total GAD 7 Score 20 20 18 1   Anxiety Difficulty - Very difficult - Not difficult at all    .Marland Kitchen MMSE - Mini Mental State Exam 07/29/2017  Orientation to time 5  Orientation to Place 5  Registration 3  Attention/ Calculation 5  Recall 1  Language- name 2 objects 2  Language- repeat 1  Language- follow 3 step command 3  Language- read & follow direction 1  Write a  sentence 1  Copy design 1  Total score 28    Pt is on max therapy for COPD with ICS, LABA and LAMA. ofcourse we could try different meds in this class but will leave up to pulmonlogist. Her walk test pulse ox did drop to 93 percent over 6 minutes but not enough to get O2. She could benefit from pulmonary rehab. For SOB I will get a new echo.  Ordered placed.   As far as memory I truly think stress and anxiety drives her cognition. Reassured patient that Mini mental Status testing was normal range for education. Add trintillex to see if could help with cognition and depression and anxiety. Xanax refilled to use as needed. Discussed abuse potential. Failed lexapro, zoloft, pristiq, celexa in the past. Follow up in 4-6 weeks. Pt declined CBT.

## 2017-08-01 ENCOUNTER — Encounter: Payer: Self-pay | Admitting: Physician Assistant

## 2017-08-01 ENCOUNTER — Telehealth: Payer: Self-pay | Admitting: Physician Assistant

## 2017-08-01 DIAGNOSIS — F418 Other specified anxiety disorders: Secondary | ICD-10-CM | POA: Insufficient documentation

## 2017-08-01 DIAGNOSIS — C3490 Malignant neoplasm of unspecified part of unspecified bronchus or lung: Secondary | ICD-10-CM | POA: Insufficient documentation

## 2017-08-01 NOTE — Telephone Encounter (Signed)
Will you please print echo for [place] to make sure ordered. Thanks.

## 2017-08-06 ENCOUNTER — Ambulatory Visit (HOSPITAL_COMMUNITY): Admission: RE | Admit: 2017-08-06 | Payer: Medicare Other | Source: Ambulatory Visit

## 2017-08-10 ENCOUNTER — Telehealth: Payer: Self-pay | Admitting: Pulmonary Disease

## 2017-08-10 NOTE — Telephone Encounter (Signed)
Ok with me 

## 2017-08-10 NOTE — Telephone Encounter (Signed)
Okay with me 

## 2017-08-10 NOTE — Telephone Encounter (Signed)
Called pt and advised message from the provider. Pt understood and verbalized understanding. Nothing further is needed.    

## 2017-08-10 NOTE — Telephone Encounter (Signed)
Spoke with pt, she states she would like to switch doctors because she wants RB to be her doctor/ RA and RB please advise.

## 2017-08-13 ENCOUNTER — Ambulatory Visit (HOSPITAL_COMMUNITY)
Admission: RE | Admit: 2017-08-13 | Discharge: 2017-08-13 | Disposition: A | Discharge status: Home / Self Care | Payer: Medicare Other | Source: Ambulatory Visit | Attending: Physician Assistant | Admitting: Physician Assistant

## 2017-08-13 DIAGNOSIS — R0602 Shortness of breath: Secondary | ICD-10-CM | POA: Insufficient documentation

## 2017-08-13 DIAGNOSIS — Z87891 Personal history of nicotine dependence: Secondary | ICD-10-CM | POA: Diagnosis not present

## 2017-08-13 DIAGNOSIS — R011 Cardiac murmur, unspecified: Secondary | ICD-10-CM | POA: Diagnosis not present

## 2017-08-13 DIAGNOSIS — I071 Rheumatic tricuspid insufficiency: Secondary | ICD-10-CM | POA: Insufficient documentation

## 2017-08-13 DIAGNOSIS — I10 Essential (primary) hypertension: Secondary | ICD-10-CM | POA: Insufficient documentation

## 2017-08-13 DIAGNOSIS — J449 Chronic obstructive pulmonary disease, unspecified: Secondary | ICD-10-CM | POA: Insufficient documentation

## 2017-08-13 NOTE — Progress Notes (Signed)
  Echocardiogram 2D Echocardiogram has been performed.  Tresa Res 08/13/2017, 12:15 PM

## 2017-09-09 ENCOUNTER — Encounter: Payer: Self-pay | Admitting: Physician Assistant

## 2017-09-09 ENCOUNTER — Ambulatory Visit (INDEPENDENT_AMBULATORY_CARE_PROVIDER_SITE_OTHER): Payer: Medicare Other | Admitting: Physician Assistant

## 2017-09-09 DIAGNOSIS — R413 Other amnesia: Secondary | ICD-10-CM | POA: Diagnosis not present

## 2017-09-09 DIAGNOSIS — F418 Other specified anxiety disorders: Secondary | ICD-10-CM

## 2017-09-09 MED ORDER — VORTIOXETINE HBR 5 MG PO TABS: 1.0000 | ORAL_TABLET | Freq: Every day | ORAL | 1 refills | Status: DC

## 2017-09-09 NOTE — Progress Notes (Signed)
Subjective:    Patient ID: [name], female    DOB: [birth year], 71 y.o.   MRN: [id number]  HPI Patient is a 71 year old pleasant female who presents to the clinic to follow-up on depression and memory changes.  We started her on from Telex 5 mg daily at last visit.  She has been on this medicine for 6 weeks.  She has tolerated it fairly well.  She noticed when she took it in the morning that she felt like she could not breathe as well.  She does have ongoing COPD.  She started taking at night and all her symptoms resolved.  She feels like it is helping her mood and memory significantly.  She feels much better on this medication.  She has a lot going on her life with her daughter who off and on lives with her and has been in and out of mental health institutions.  She feels like she has been able to handle this much better.  She denies any suicidal thoughts or homicidal homicidal ideations.  .. Active Ambulatory Problems    Diagnosis Date Noted  . History of lung cancer 07/28/2006  . HYPOTHYROIDISM, POSTSURGICAL 05/19/2007  . HYPERCHOLESTEROLEMIA 07/28/2006  . ANEMIA, IRON DEFICIENCY NOS 01/04/2007  . Major depressive disorder, recurrent episode ([place]) 07/28/2006  . Anxiety state 07/28/2006  . HYPERTENSION, BENIGN SYSTEMIC 07/28/2006  . DISEASE, ISCHEMIC HEART, CHRONIC NOS 05/19/2007  . PERIPHERAL VASCULAR DISEASE, UNSPEC. 07/28/2006  . COPD with chronic bronchitis 07/28/2006  . Hypoxemia 06/22/2013  . History of hip fracture 09/02/2013  . Insomnia 09/04/2013  . GERD (gastroesophageal reflux disease) 09/04/2013  . Sensorineural hearing loss of both ears 11/18/2013  . Systolic murmur 56/31/4970  . Episode of abnormal behavior 11/28/2013  . Word finding difficulty 11/28/2013  . Memory loss 11/28/2013  . Transient cerebral ischemic attack 03/01/2014  . Carotid artery narrowing 02/27/2014  . Clinical depression 02/27/2014  . Cerebrovascular accident, old 06/14/2013  . IFG (impaired  fasting glucose) 04/12/2015  . Elevated blood pressure 10/16/2015  . Split S2 (second heart sound) 10/16/2015  . Anxiety as acute reaction to exceptional stress 12/05/2015  . Muscle spasm of back 12/05/2015  . Memory changes 12/05/2015  . Osteopenia 04/30/2016  . Chronic pain of left ankle 12/16/2016  . Left foot pain 02/17/2017  . Cavus foot, acquired 04/28/2017  . Depression with anxiety 08/01/2017  . Lung cancer ([place]) 08/01/2017   Resolved Ambulatory Problems    Diagnosis Date Noted  . THRUSH 09/26/2010  . Abdominal bloating 03/04/2011  .  Chronic Bronchopleural fistula    Past Medical History:  Diagnosis Date  . 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 CAD (coronary artery disease)   . Cancer ([place]) 2007  . COPD (chronic obstructive pulmonary disease) ([place]) 9/10  . Hurthle cell adenoma of thyroid 2007  . Hypertension   . Iron deficiency anemia 2/08  . Lung cancer ([place])   . Peripheral vascular disease ([place])   . RLS (restless legs syndrome)       Review of Systems  All other systems reviewed and are negative.      Objective:   Physical Exam  Constitutional: She is oriented to person, place, and time. She appears well-developed and well-nourished.  HENT:  Head: Normocephalic and atraumatic.  Cardiovascular: Normal rate and regular rhythm.  3/6 SEM with S2 click.  Pulmonary/Chest: Effort normal and breath sounds normal.  Neurological: She is alert and oriented to person, place, and time.  Psychiatric: She has  a normal mood and affect. Her behavior is normal.          Assessment & Plan:  Marland KitchenMarland KitchenKawana was seen today for follow-up.  Diagnoses and all orders for this visit:  Depression with anxiety -     vortioxetine HBr (TRINTELLIX) 5 MG TABS; Take 1 tablet (5 mg total) by mouth daily.  Memory changes -     vortioxetine HBr (TRINTELLIX) 5 MG TABS; Take 1 tablet (5 mg total) by mouth daily.     .. Depression screen [facility] 2/9 09/09/2017 07/29/2017 04/28/2017 05/27/2016  03/15/2015  Decreased Interest 0 0 0 0 0  Down, Depressed, Hopeless 0 0 3 0 0  PHQ - 2 Score 0 0 3 0 0  Altered sleeping 0 0 0 0 -  Tired, decreased energy 0 0 1 0 -  Change in appetite 0 0 0 0 -  Feeling bad or failure about yourself  0 0 3 0 -  Trouble concentrating 0 0 3 1 -  Moving slowly or fidgety/restless 1 0 3 0 -  Suicidal thoughts 0 0 0 0 -  PHQ-9 Score 1 0 13 1 -  Difficult doing work/chores Not difficult at all Not difficult at all - - -   .Marland Kitchen GAD 7 : Generalized Anxiety Score 09/09/2017 07/29/2017 07/29/2017 04/28/2017  Nervous, Anxious, on Edge 0 3 3 3   Control/stop worrying 1 3 3  0  Worry too much - different things 1 3 3 3   Trouble relaxing 0 2 2 3   Restless 0 3 3 3   Easily annoyed or irritable 1 3 3 3   Afraid - awful might happen 1 3 3 3   Total GAD 7 Score 4 20 20 18   Anxiety Difficulty - - Very difficult -    Patient's numbers have improved dramatically.  Patient is doing great on current dose.  We will keep her at this dose and follow-up in 6 months.

## 2017-09-24 ENCOUNTER — Other Ambulatory Visit: Payer: Self-pay

## 2017-10-22 ENCOUNTER — Other Ambulatory Visit: Payer: Self-pay

## 2017-10-22 DIAGNOSIS — F418 Other specified anxiety disorders: Secondary | ICD-10-CM

## 2017-10-22 MED ORDER — QUETIAPINE FUMARATE 100 MG PO TABS: 100.0000 mg | ORAL_TABLET | Freq: Every day | ORAL | 1 refills | Status: DC

## 2017-10-22 NOTE — Telephone Encounter (Signed)
Patient needs a refill on Seroquel. Never prescribe by Luvenia Starch.

## 2017-11-13 ENCOUNTER — Other Ambulatory Visit: Payer: Self-pay

## 2017-11-13 DIAGNOSIS — I10 Essential (primary) hypertension: Secondary | ICD-10-CM

## 2017-11-13 MED ORDER — AMLODIPINE BESYLATE 10 MG PO TABS: 10.0000 mg | ORAL_TABLET | Freq: Every day | ORAL | 0 refills | Status: DC

## 2017-11-13 MED ORDER — AMLODIPINE BESYLATE 10 MG PO TABS: 10.0000 mg | ORAL_TABLET | Freq: Every day | ORAL | 1 refills | Status: DC

## 2017-11-16 ENCOUNTER — Ambulatory Visit: Payer: Medicare Other | Admitting: Physician Assistant

## 2017-12-12 IMAGING — CT CT HEAD W/O CM
1 series · 16 of 29 positions shown, 20 images · non-contrast
Comparison: 12/12/2011 head CT.

CLINICAL DATA: 69-year-old female with memory difficulty.
Occasional dizziness. History of lung cancer treated surgically
3001. Initial encounter.

EXAM:
CT HEAD WITHOUT CONTRAST
TECHNIQUE: Contiguous axial images were obtained from the base of the skull
through the vertex without intravenous contrast.

[Series 3: head wo · axial · 0.40mm/px · z∈[-118,+12]mm · 16 of 29 slices shown, 20 images]
[im 2/29  brain]
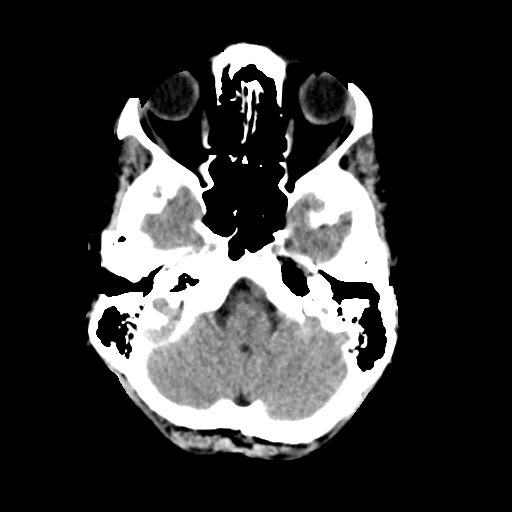
[im 2/29  bone]
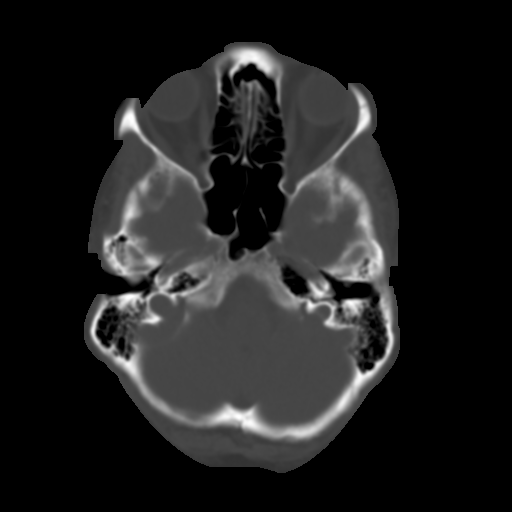
[im 4/29  brain]
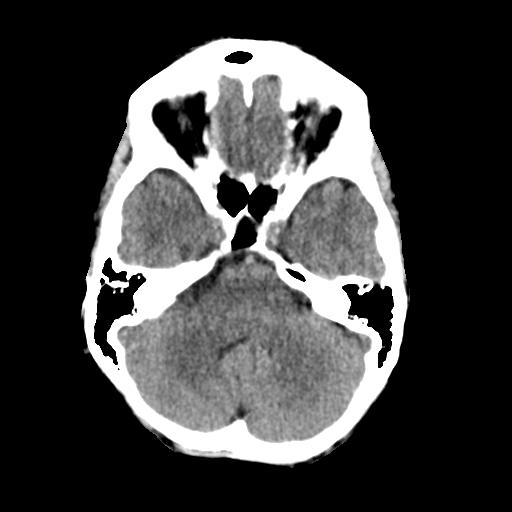
[im 6/29  brain]
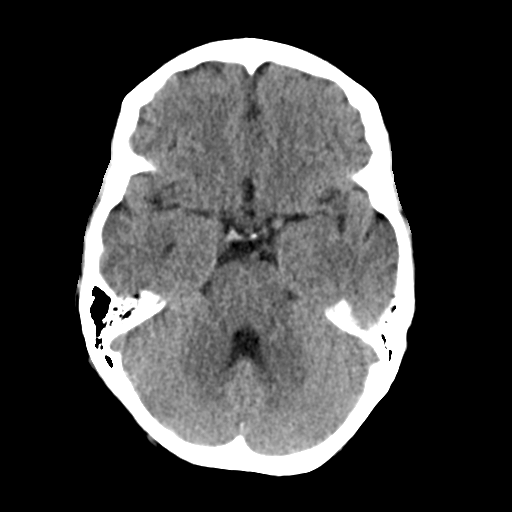
[im 7/29  brain]
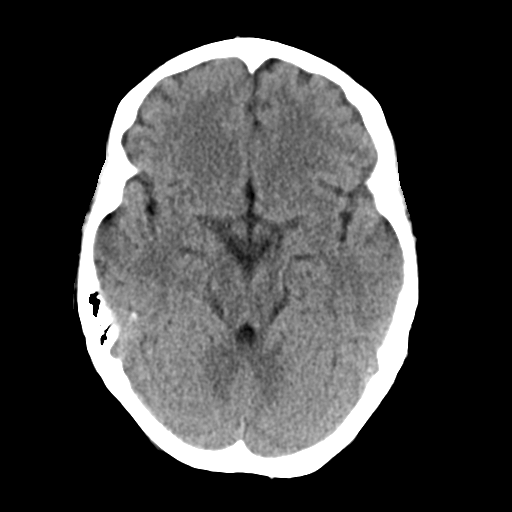
[im 9/29  brain]
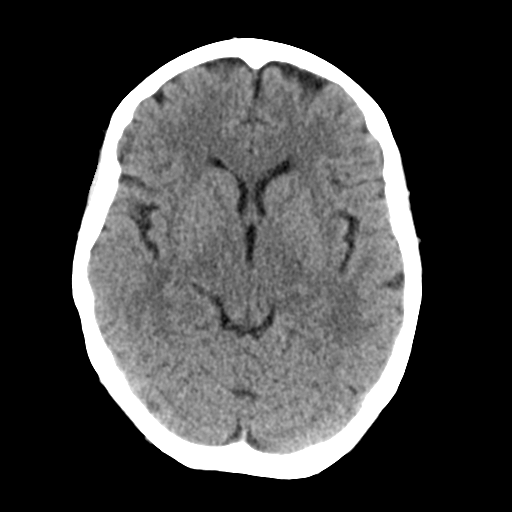
[im 9/29  bone]
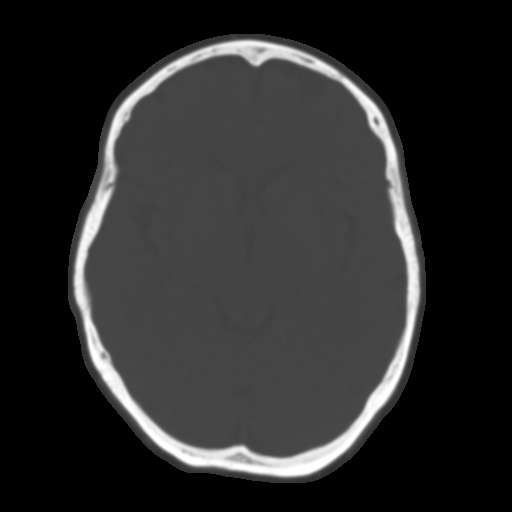
[im 11/29  brain]
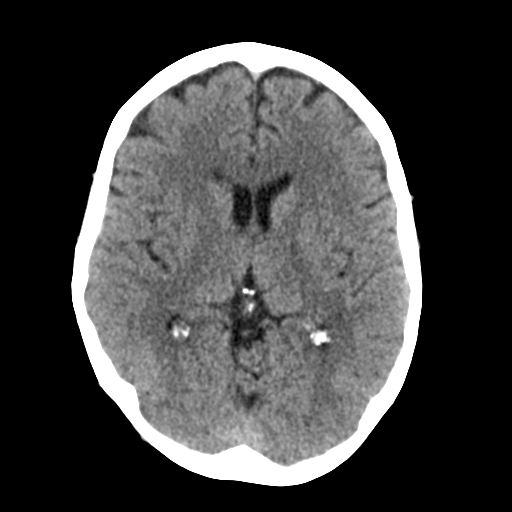
[im 12/29  brain]
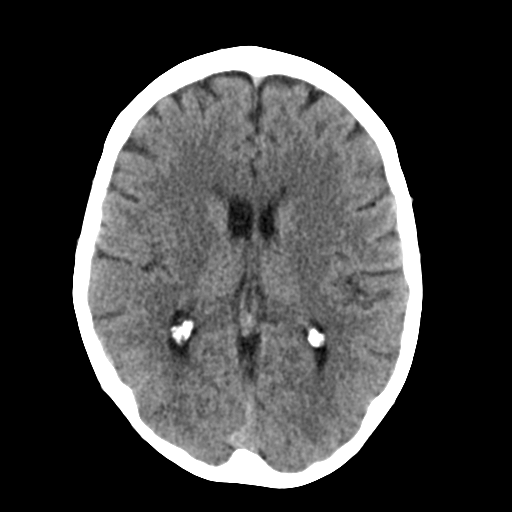
[im 14/29  brain]
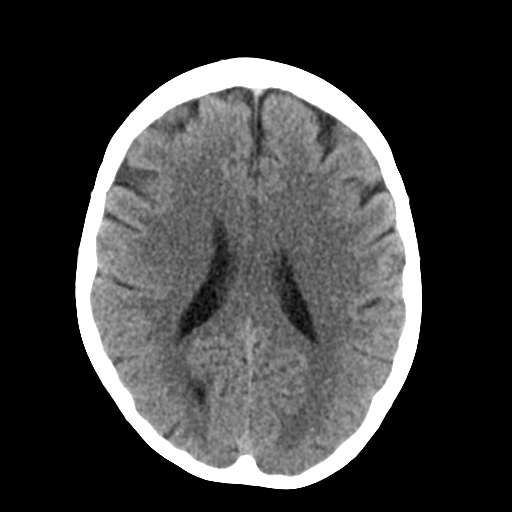
[im 16/29  brain]
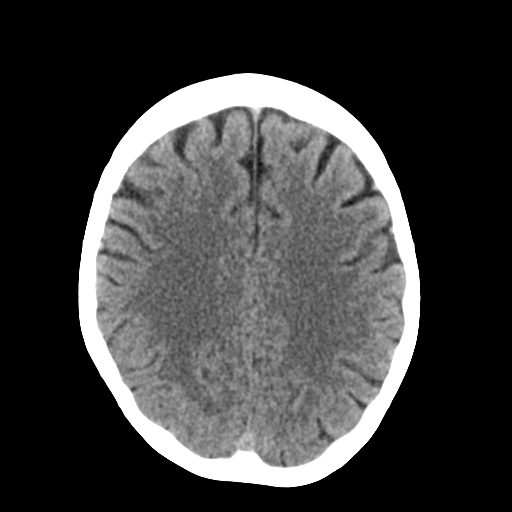
[im 16/29  bone]
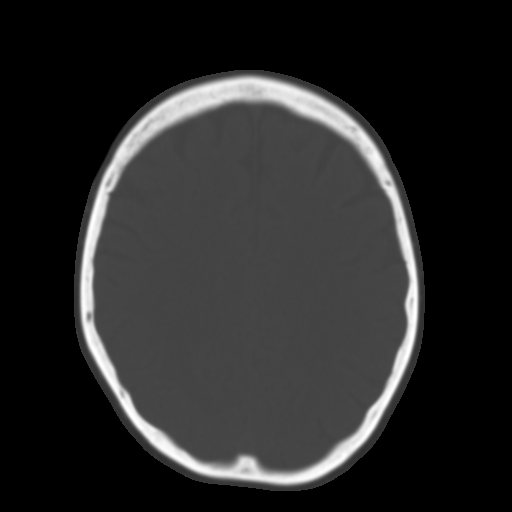
[im 18/29  brain]
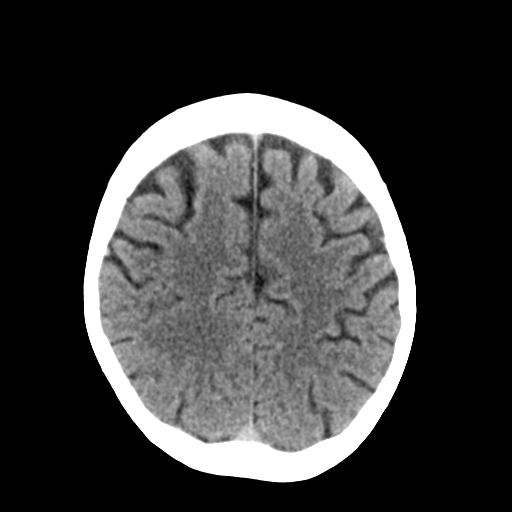
[im 19/29  brain]
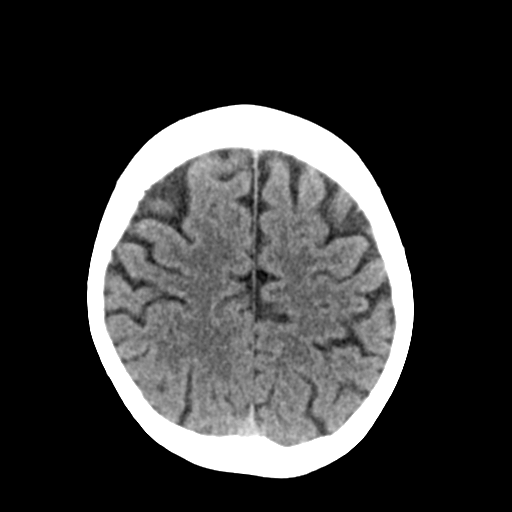
[im 21/29  brain]
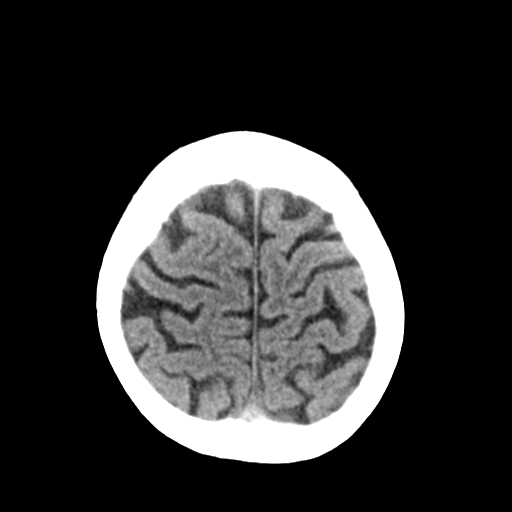
[im 23/29  brain]
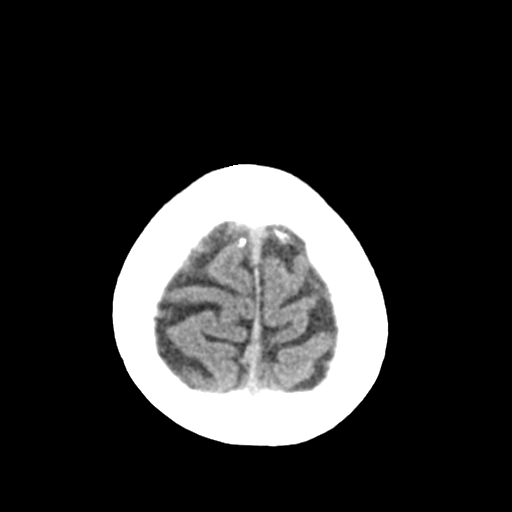
[im 23/29  bone]
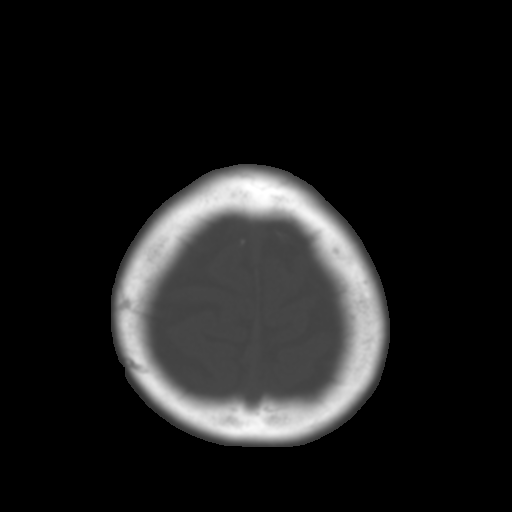
[im 24/29  brain]
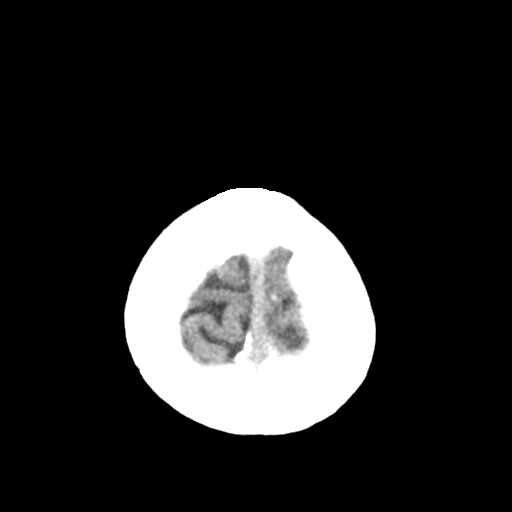
[im 26/29  brain]
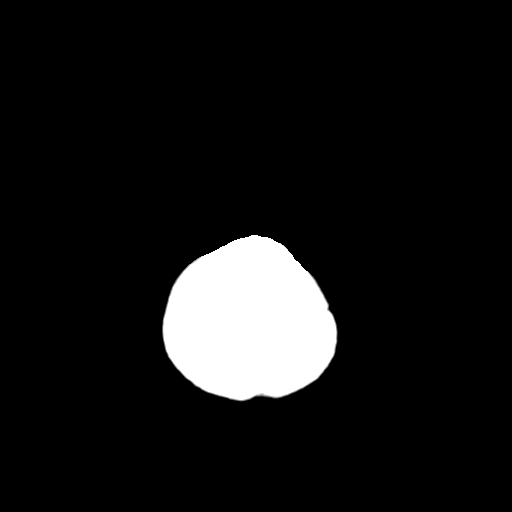
[im 28/29  brain]
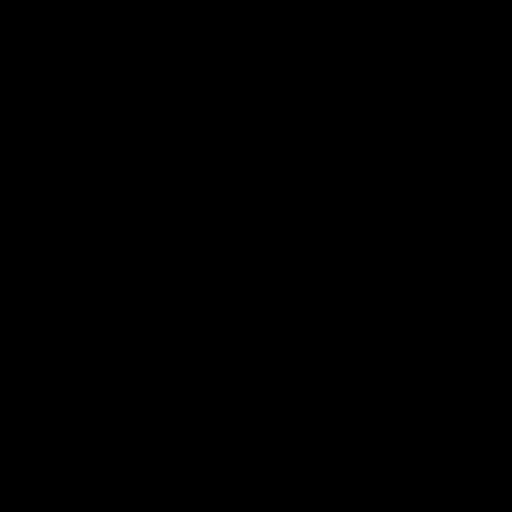

[16 of 29 positions shown; findings below may reference images not displayed]

FINDINGS: No age advanced atrophy or hydrocephalus.

No CT evidence of large acute infarct.

No intracranial mass lesion noted on this unenhanced exam.

No calvarial abnormality.

Visualize mastoid air cells, middle ear cavities and paranasal
sinuses are clear.

The inferior aspect of the posterior fossa was not completely imaged
on present exam.
IMPRESSION: No findings of age advanced atrophy.  Please see above

## 2018-01-26 ENCOUNTER — Telehealth: Payer: Self-pay

## 2018-01-26 ENCOUNTER — Ambulatory Visit (INDEPENDENT_AMBULATORY_CARE_PROVIDER_SITE_OTHER): Payer: Medicare Other | Admitting: Physician Assistant

## 2018-01-26 ENCOUNTER — Ambulatory Visit (INDEPENDENT_AMBULATORY_CARE_PROVIDER_SITE_OTHER): Payer: Medicare Other

## 2018-01-26 ENCOUNTER — Encounter: Payer: Self-pay | Admitting: Physician Assistant

## 2018-01-26 VITALS — BP 130/64 | HR 88 | Temp 97.7°F | Resp 22 | Wt 144.0 lb

## 2018-01-26 DIAGNOSIS — R079 Chest pain, unspecified: Secondary | ICD-10-CM | POA: Insufficient documentation

## 2018-01-26 DIAGNOSIS — R6889 Other general symptoms and signs: Secondary | ICD-10-CM | POA: Diagnosis not present

## 2018-01-26 DIAGNOSIS — E782 Mixed hyperlipidemia: Secondary | ICD-10-CM | POA: Diagnosis not present

## 2018-01-26 DIAGNOSIS — E890 Postprocedural hypothyroidism: Secondary | ICD-10-CM | POA: Diagnosis not present

## 2018-01-26 DIAGNOSIS — R0602 Shortness of breath: Secondary | ICD-10-CM

## 2018-01-26 DIAGNOSIS — J449 Chronic obstructive pulmonary disease, unspecified: Secondary | ICD-10-CM

## 2018-01-26 DIAGNOSIS — Z85118 Personal history of other malignant neoplasm of bronchus and lung: Secondary | ICD-10-CM

## 2018-01-26 DIAGNOSIS — R7301 Impaired fasting glucose: Secondary | ICD-10-CM

## 2018-01-26 DIAGNOSIS — R05 Cough: Secondary | ICD-10-CM | POA: Diagnosis not present

## 2018-01-26 DIAGNOSIS — R7989 Other specified abnormal findings of blood chemistry: Secondary | ICD-10-CM

## 2018-01-26 DIAGNOSIS — R0609 Other forms of dyspnea: Secondary | ICD-10-CM | POA: Diagnosis not present

## 2018-01-26 DIAGNOSIS — R0600 Dyspnea, unspecified: Secondary | ICD-10-CM

## 2018-01-26 HISTORY — DX: Chest pain, unspecified: R07.9

## 2018-01-26 LAB — D-DIMER, QUANTITATIVE (NOT AT ARMC): D-Dimer, Quant: 2.58 mcg/mL FEU — ABNORMAL HIGH (ref <0.50)

## 2018-01-26 MED ORDER — FLUTICASONE-UMECLIDIN-VILANT 100-62.5-25 MCG/INH IN AEPB: 1.0000 | INHALATION_SPRAY | Freq: Every day | RESPIRATORY_TRACT | 5 refills | Status: DC

## 2018-01-26 NOTE — Assessment & Plan Note (Addendum)
Fairly severe COPD, currently on Brovana, Spiriva, Qvar. I do think it is a good idea to consolidate her regimen, we could try Trelegy versus continuing Spiriva and switching to Symbicort. Chest x-ray, routine labs, she is having some dizziness and chest pain with exertion, I would also agree with cardiology referral, she will likely need a nuclear stress test, I do not think she would tolerate a treadmill stress test. Also due to pleuritic chest pain adding a d-dimer. If we see prerenal appearance of her BUN/creatinine she would be a candidate for aggressive oral hydration versus bring her back in for IV hydration. Follow-up and plan will depend on testing results.

## 2018-01-26 NOTE — Progress Notes (Signed)
Subjective:    CC: Shortness of breath  HPI: This is a pleasant 72 year old female with severe COPD, currently on Brovana, Spiriva, Qvar.  For the past several days she has had increasing shortness of breath with exertion, chest pain on deep breathing.  Having moderate dizziness when standing and on exertion.  Echocardiogram from a few months ago was relatively unremarkable.  No fevers, chills, night sweats, weight loss, no black or tarry stools, no hematemesis, hematochezia.  She has never had a stress test.  I reviewed the past medical history, family history, social history, surgical history, and allergies today and no changes were needed.  Please see the problem list section below in epic for further details.  Past Medical History: Past Medical History:  Diagnosis Date  . Bronchopleural fistula ([place])   . CAD (coronary artery disease)    20% stenosis on cath - no intervention required (W-S cards), negative nuclear stress test 11/07  . Cancer [facility]) 2007   lung (Dr. Earlie Server and Dr. Arlyce Dice)  . COPD (chronic obstructive pulmonary disease) ([place]) 9/10   golds stage III, FeV1 39%  . Hurthle cell adenoma of thyroid 2007  . Hypertension   . Iron deficiency anemia 2/08   s/p 2 unit transfusion  . Lung cancer ([place])   . Peripheral vascular disease ([place])   . RLS (restless legs syndrome)    Past Surgical History: Past Surgical History:  Procedure Laterality Date  . APPENDECTOMY  19  . CHOLECYSTECTOMY  19  . heart cartherization    . LUL  2005   LUL wedge resection/VATS  . LUL  4/08   LUL lobectomy for cystic cavity and Candida, no cancer seen  . THYROIDECTOMY, PARTIAL  2007   Dr. Arlyce Dice   Social History: Social History   Socioeconomic History  . Marital status: Married    Spouse name: Not on file  . Number of children: Not on file  . Years of education: Not on file  . Highest education level: Not on file  Occupational History  . Not on file  Social Needs  . Financial  resource strain: Not on file  . Food insecurity:    Worry: Not on file    Inability: Not on file  . Transportation needs:    Medical: Not on file    Non-medical: Not on file  Tobacco Use  . Smoking status: Former Smoker    Packs/day: 1.50    Years: 30.00    Pack years: 45.00    Types: Cigarettes    Last attempt to quit: 10/20/2004    Years since quitting: 13.2  . Smokeless tobacco: Never Used  Substance and Sexual Activity  . Alcohol use: No  . Drug use: No  . Sexual activity: Not on file  Lifestyle  . Physical activity:    Days per week: Not on file    Minutes per session: Not on file  . Stress: Not on file  Relationships  . Social connections:    Talks on phone: Not on file    Gets together: Not on file    Attends religious service: Not on file    Active member of club or organization: Not on file    Attends meetings of clubs or organizations: Not on file    Relationship status: Not on file  Other Topics Concern  . Not on file  Social History Narrative  . Not on file   Family History: Family History  Problem Relation Age of Onset  .  Cancer Mother        Larynx & Endometrial cancer  . Cancer Father        laryngeal cancer  . Hypertension Father   . Diabetes Sister   . Multiple sclerosis Sister   . Cancer Sister 41       breast   Allergies: Allergies  Allergen Reactions  . Desvenlafaxine Succinate Er Other (See Comments)    shaking  . Lisinopril-Hydrochlorothiazide     REACTION: N/V cough  . Nitrofurantoin Monohyd Macro   . Penicillins     REACTION: Hives  . Zoloft [Sertraline Hcl] Other (See Comments)    "too many side effects"   Medications: See med rec.  Review of Systems: No fevers, chills, night sweats, weight loss, chest pain, or shortness of breath.   Objective:    General: Well Developed, well nourished, and in no acute distress.  Neuro: Alert and oriented x3, extra-ocular muscles intact, sensation grossly intact.  HEENT: Normocephalic,  atraumatic, pupils equal round reactive to light, neck supple, no masses, no lymphadenopathy, thyroid nonpalpable.  Skin: Warm and dry, no rashes. Cardiac: Regular rate and rhythm, no murmurs rubs or gallops, somewhat distant heart sounds, no lower extremity edema.  Respiratory: Somewhat poor air movement throughout. Not using accessory muscles, speaking in full sentences.  Impression and Recommendations:    COPD with chronic bronchitis Fairly severe COPD, currently on Brovana, Spiriva, Qvar. I do think it is a good idea to consolidate her regimen, we could try Trelegy versus continuing Spiriva and switching to Symbicort. Chest x-ray, routine labs, she is having some dizziness and chest pain with exertion, I would also agree with cardiology referral, she will likely need a nuclear stress test, I do not think she would tolerate a treadmill stress test. Also due to pleuritic chest pain adding a d-dimer. If we see prerenal appearance of her BUN/creatinine she would be a candidate for aggressive oral hydration versus bring her back in for IV hydration. Follow-up and plan will depend on testing results.  I spent 25 minutes with this patient, greater than 50% was face-to-face time counseling regarding the above diagnoses ___________________________________________ Gwen Her. Dianah Field, M.D., ABFM., CAQSM. Primary Care and [place] Instructor of [place] [employer] [facility] [employer] [facility]

## 2018-01-26 NOTE — Telephone Encounter (Signed)
-----   Message from [facility] [place] sent at 01/26/2018  3:42 PM EDT ----- Regarding: D-dimer Let [name] know that her blood test to rule out a blood clot was positive. This does not mean she definitely has a blood clot, but she needs a CT scan to make sure there is not a clot in her lung causing her shortness of breath. This has been ordered and will be performed at [facility], due to Medicare.

## 2018-01-26 NOTE — Addendum Note (Signed)
Addended by: Nelson Chimes E on: 01/26/2018 03:44 PM   Modules accepted: Orders

## 2018-01-26 NOTE — Progress Notes (Signed)
HPI:                                                                [name] is a 72 y.o. female who presents to [place]: [place] today for dizziness and SOB  Patient with PMH HTN, CAD, thyroid disease, hx of TIA, COPD w/chronic bronchitis, hx of lung cancer presents with lightheadedness and decreased exercise tolerance for the last 4-5 days. Reports significant change in her baseline COPD symptoms, with dyspnea with mild exertion such as walking to the bathroom. Reports chest pain with exertion associated with severe dyspnea that resolves with rest. Pain is retrosternal "like someone punched me in the chest." Describes lightheadedness as "like I'm going to pass out." No associated vision change, nausea, diaphoresis. No syncope.   She endorses severe dyspnea today. No current chest pain.   Prior work-up: Echo 07/2017 EF 55-60%, no LV dysfunction No prior ECG  Depression screen [facility] 2/9 09/09/2017 07/29/2017 04/28/2017 05/27/2016 03/15/2015  Decreased Interest 0 0 0 0 0  Down, Depressed, Hopeless 0 0 3 0 0  PHQ - 2 Score 0 0 3 0 0  Altered sleeping 0 0 0 0 -  Tired, decreased energy 0 0 1 0 -  Change in appetite 0 0 0 0 -  Feeling bad or failure about yourself  0 0 3 0 -  Trouble concentrating 0 0 3 1 -  Moving slowly or fidgety/restless 1 0 3 0 -  Suicidal thoughts 0 0 0 0 -  PHQ-9 Score 1 0 13 1 -  Difficult doing work/chores Not difficult at all Not difficult at all - - -    GAD 7 : Generalized Anxiety Score 09/09/2017 07/29/2017 07/29/2017 04/28/2017  Nervous, Anxious, on Edge 0 3 3 3   Control/stop worrying 1 3 3  0  Worry too much - different things 1 3 3 3   Trouble relaxing 0 2 2 3   Restless 0 3 3 3   Easily annoyed or irritable 1 3 3 3   Afraid - awful might happen 1 3 3 3   Total GAD 7 Score 4 20 20 18   Anxiety Difficulty - - Very difficult -      Past Medical History:  Diagnosis Date  . 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 CAD  (coronary artery disease)    20% stenosis on cath - no intervention required (W-S cards), negative nuclear stress test 11/07  . Cancer [facility]) 2007   lung (Dr. Earlie Server and Dr. Arlyce Dice)  . COPD (chronic obstructive pulmonary disease) ([place]) 9/10   golds stage III, FeV1 39%  . Hurthle cell adenoma of thyroid 2007  . Hypertension   . Iron deficiency anemia 2/08   s/p 2 unit transfusion  . Lung cancer ([place])   . Peripheral vascular disease ([place])   . RLS (restless legs syndrome)    Past Surgical History:  Procedure Laterality Date  . APPENDECTOMY  18  . CHOLECYSTECTOMY  18  . heart cartherization    . LUL  2005   LUL wedge resection/VATS  . LUL  4/08   LUL lobectomy for cystic cavity and Candida, no cancer seen  . THYROIDECTOMY, PARTIAL  2007   Dr. Arlyce Dice   Social History   Tobacco  Use  . Smoking status: Former Smoker    Packs/day: 1.50    Years: 30.00    Pack years: 45.00    Types: Cigarettes    Last attempt to quit: 10/20/2004    Years since quitting: 13.2  . Smokeless tobacco: Never Used  Substance Use Topics  . Alcohol use: No   family history includes Cancer in her father and mother; Cancer (age of onset: 55) in her sister; Diabetes in her sister; Hypertension in her father; Multiple sclerosis in her sister.    ROS: negative except as noted in the HPI  Medications: Current Outpatient Medications  Medication Sig Dispense Refill  . albuterol (PROVENTIL HFA;VENTOLIN HFA) 108 (90 Base) MCG/ACT inhaler Inhale 2 puffs into the lungs every 6 (six) hours as needed for wheezing or shortness of breath. 3 Inhaler 3  . albuterol (PROVENTIL) (2.5 MG/3ML) 0.083% nebulizer solution Take 3 mLs (2.5 mg total) by nebulization every 6 (six) hours as needed. 1080 mL 3  . ALPRAZolam (XANAX) 1 MG tablet Take 1 tablet (1 mg total) by mouth daily as needed for anxiety. 90 tablet 1  . amLODipine (NORVASC) 10 MG tablet Take 1 tablet (10 mg total) by mouth daily. 90 tablet 1  . arformoterol  (BROVANA) 15 MCG/2ML NEBU Take 2 mLs (15 mcg total) by nebulization 2 (two) times daily. 360 mL 4  . aspirin EC 81 MG tablet Take 81 mg by mouth daily.    Marland Kitchen atorvastatin (LIPITOR) 20 MG tablet Take 1 tablet (20 mg total) by mouth at bedtime. take 1 tablet by mouth once daily 90 tablet 4  . beclomethasone (QVAR) 40 MCG/ACT inhaler Inhale 2 puffs into the lungs 2 (two) times daily. 3 Inhaler 3  . levothyroxine (SYNTHROID, LEVOTHROID) 50 MCG tablet take 1 tablet by mouth once daily 90 tablet 1  . QUEtiapine (SEROQUEL) 100 MG tablet Take 1 tablet (100 mg total) by mouth at bedtime. 90 tablet 1  . tiotropium (SPIRIVA) 18 MCG inhalation capsule Place 1 capsule (18 mcg total) into inhaler and inhale daily. 90 capsule 4  . vortioxetine HBr (TRINTELLIX) 5 MG TABS Take 1 tablet (5 mg total) by mouth daily. 90 tablet 1   No current facility-administered medications for this visit.    Allergies  Allergen Reactions  . Desvenlafaxine Succinate Er Other (See Comments)    shaking  . Lisinopril-Hydrochlorothiazide     REACTION: N/V cough  . Nitrofurantoin Monohyd Macro   . Penicillins     REACTION: Hives  . Zoloft [Sertraline Hcl] Other (See Comments)    "too many side effects"       Objective:  BP 130/64   Pulse 88   Temp 97.7 F (36.5 C) (Oral)   Wt 144 lb (65.3 kg)   SpO2 94%   BMI 25.51 kg/m  Gen:  alert, not ill-appearing, no distress, appropriate for age HEENT: head normocephalic without obvious abnormality, conjunctiva and cornea clear, trachea midline Pulm: respiratory rate slightly increased, no pursed lip breathing or retractions, normal phonation, clear to auscultation bilaterally, diminished expiratory phase, no wheezes, rales or rhonchi CV: Normal rate, regular rhythm, split s2, no murmurs, clicks or rubs  Neuro: alert and oriented x 3, no tremor MSK: extremities atraumatic, normal gait and station Skin: intact, no rashes on exposed skin, no jaundice, no cyanosis Psych:  well-groomed, cooperative, good eye contact, euthymic mood, affect mood-congruent, speech is articulate, and thought processes clear and goal-directed    No results found for this or any previous  visit (from the past 72 hour(s)). No results found.    Assessment and Plan: 72 y.o. female with HTN, CAD, thyroid disease, hx of TIA, COPD w/chronic bronchitis, hx of lung cancer presents with lightheadedness and decreased exercise tolerance for the last 4-5 days. She could not perform a 6 minute walk test in the office today. SpO2 94% on RA at rest, relative hypoxia, no evidence of respiratory distress. Vital signs otherwise stable. Due to patient's complex medical history, Dr. Dianah Field was consulted.  At this point, differential is still broad. Plan today for thorough work-up with CXR, CMP, CBC w/diff, BNP, D-dimer. She has a recent echo that shows normal EF and structurally normal heart, which is reassuring.  I think she needs an outpatient work-up for exertional chest pain and I have referred her to Cardiology for this.  Going to see if insurance will cover Trelegy. If approved, she will stop Qvar and Spiriva. For now she is to continue her current COPD medications. We could not perform 6 minute walk test in office today to get home oxygen approved, but this warrants further testing and follow-up with pulmonologist. I see where she was referred to [facility] in October, but this has slipped through the cracks.  Decreased exercise tolerance - Plan: COMPLETE METABOLIC PANEL WITH GFR, TSH + free T4, CBC with Differential/Platelet, B Nat Peptide, DG Chest 2 View  COPD with chronic bronchitis - Plan: Fluticasone-Umeclidin-Vilant (TRELEGY ELLIPTA) 100-62.5-25 MCG/INH AEPB  Dyspnea on exertion - Plan: COMPLETE METABOLIC PANEL WITH GFR, TSH + free T4, CBC with Differential/Platelet, B Nat Peptide, DG Chest 2 View, D-dimer, quantitative (not at [facility])  Postoperative hypothyroidism - Plan: TSH + free  T4  Exertional chest pain - Plan: DG Chest 2 View, D-dimer, quantitative (not at [facility]), Ambulatory referral to Cardiology  IFG (impaired fasting glucose) - Plan: Hemoglobin A1c  Mixed hyperlipidemia - Plan: Lipid Panel w/reflex Direct LDL   Patient education and anticipatory guidance given Patient agrees with treatment plan Follow-up w/PCP in 1 week or sooner as needed if symptoms worsen or fail to improve  Darlyne Russian PA-C

## 2018-01-26 NOTE — Telephone Encounter (Signed)
Left vm for pt to return call to clinic -EH/RMA  

## 2018-01-26 NOTE — Progress Notes (Signed)
D-dimer positive CT angio ordered stat Patient will be notified by CMA

## 2018-01-27 ENCOUNTER — Encounter: Payer: Self-pay | Admitting: Physician Assistant

## 2018-01-27 ENCOUNTER — Ambulatory Visit (HOSPITAL_BASED_OUTPATIENT_CLINIC_OR_DEPARTMENT_OTHER)
Admission: RE | Admit: 2018-01-27 | Discharge: 2018-01-27 | Disposition: A | Discharge status: Home / Self Care | Payer: Medicare Other | Source: Ambulatory Visit | Attending: Physician Assistant | Admitting: Physician Assistant

## 2018-01-27 ENCOUNTER — Encounter (HOSPITAL_BASED_OUTPATIENT_CLINIC_OR_DEPARTMENT_OTHER): Payer: Self-pay

## 2018-01-27 ENCOUNTER — Other Ambulatory Visit: Payer: Self-pay | Admitting: Physician Assistant

## 2018-01-27 DIAGNOSIS — J439 Emphysema, unspecified: Secondary | ICD-10-CM | POA: Insufficient documentation

## 2018-01-27 DIAGNOSIS — R9389 Abnormal findings on diagnostic imaging of other specified body structures: Secondary | ICD-10-CM | POA: Diagnosis not present

## 2018-01-27 DIAGNOSIS — I2699 Other pulmonary embolism without acute cor pulmonale: Secondary | ICD-10-CM | POA: Diagnosis not present

## 2018-01-27 DIAGNOSIS — R079 Chest pain, unspecified: Secondary | ICD-10-CM | POA: Diagnosis present

## 2018-01-27 DIAGNOSIS — R0602 Shortness of breath: Secondary | ICD-10-CM | POA: Diagnosis not present

## 2018-01-27 DIAGNOSIS — R7989 Other specified abnormal findings of blood chemistry: Secondary | ICD-10-CM | POA: Diagnosis not present

## 2018-01-27 DIAGNOSIS — I700 Atherosclerosis of aorta: Secondary | ICD-10-CM | POA: Diagnosis not present

## 2018-01-27 LAB — CBC WITH DIFFERENTIAL/PLATELET
Basophils Absolute: 53 cells/uL (ref 0–200)
Basophils Relative: 0.7 %
Eosinophils Absolute: 98 cells/uL (ref 15–500)
Eosinophils Relative: 1.3 %
HCT: 37.8 % (ref 35.0–45.0)
Hemoglobin: 13.2 g/dL (ref 11.7–15.5)
Lymphs Abs: 1905 cells/uL (ref 850–3900)
MCH: 30.9 pg (ref 27.0–33.0)
MCHC: 34.9 g/dL (ref 32.0–36.0)
MCV: 88.5 fL (ref 80.0–100.0)
MPV: 11.1 fL (ref 7.5–12.5)
Monocytes Relative: 10.6 %
Neutro Abs: 4650 cells/uL (ref 1500–7800)
Neutrophils Relative %: 62 %
Platelets: 310 10*3/uL (ref 140–400)
RBC: 4.27 10*6/uL (ref 3.80–5.10)
RDW: 12.3 % (ref 11.0–15.0)
Total Lymphocyte: 25.4 %
WBC mixed population: 795 cells/uL (ref 200–950)
WBC: 7.5 10*3/uL (ref 3.8–10.8)

## 2018-01-27 LAB — COMPLETE METABOLIC PANEL WITH GFR
AG Ratio: 1.6 (calc) (ref 1.0–2.5)
ALT: 14 U/L (ref 6–29)
AST: 20 U/L (ref 10–35)
Albumin: 4.2 g/dL (ref 3.6–5.1)
Alkaline phosphatase (APISO): 109 U/L (ref 33–130)
BUN: 19 mg/dL (ref 7–25)
CO2: 32 mmol/L (ref 20–32)
Calcium: 9.3 mg/dL (ref 8.6–10.4)
Chloride: 101 mmol/L (ref 98–110)
Creat: 0.8 mg/dL (ref 0.60–0.93)
GFR, Est African American: 87 mL/min/{1.73_m2} (ref >=60)
GFR, Est Non African American: 75 mL/min/{1.73_m2} (ref >=60)
Globulin: 2.6 g/dL (calc) (ref 1.9–3.7)
Glucose, Bld: 107 mg/dL — ABNORMAL HIGH (ref 65–99)
Potassium: 3.9 mmol/L (ref 3.5–5.3)
Sodium: 140 mmol/L (ref 135–146)
Total Bilirubin: 0.4 mg/dL (ref 0.2–1.2)
Total Protein: 6.8 g/dL (ref 6.1–8.1)

## 2018-01-27 LAB — LIPID PANEL W/REFLEX DIRECT LDL
Cholesterol: 161 mg/dL (ref <200)
HDL: 85 mg/dL (ref >50)
LDL Cholesterol (Calc): 62 mg/dL (calc)
Non-HDL Cholesterol (Calc): 76 mg/dL (calc) (ref <130)
Total CHOL/HDL Ratio: 1.9 (calc) (ref <5.0)
Triglycerides: 62 mg/dL (ref <150)

## 2018-01-27 LAB — HEMOGLOBIN A1C
Hgb A1c MFr Bld: 5.5 % of total Hgb (ref <5.7)
Mean Plasma Glucose: 111 (calc)
eAG (mmol/L): 6.2 (calc)

## 2018-01-27 LAB — TSH+FREE T4: TSH W/REFLEX TO FT4: 1.6 mIU/L (ref 0.40–4.50)

## 2018-01-27 LAB — BRAIN NATRIURETIC PEPTIDE: Brain Natriuretic Peptide: 32 pg/mL (ref <100)

## 2018-01-27 MED ORDER — IOPAMIDOL (ISOVUE-370) INJECTION 76%
100.0000 mL | Freq: Once | INTRAVENOUS | Status: AC | PRN
  Administered 2018-01-27: 100 mL via INTRAVENOUS

## 2018-01-27 MED ORDER — APIXABAN 5 MG PO TABS: ORAL_TABLET | ORAL | 0 refills | Status: DC

## 2018-01-27 NOTE — Progress Notes (Signed)
Good morning [name],  [place] left you a voicemail yesterday afternoon that your d-dimer test was positive. This does not necessarily mean that you have a blood clot, but it means we have to do a CT to make sure there is not a clot in your lung that is causing your shortness of breath. This test is ordered and needs to be performed today at [facility]. Unfortunately Medicare does not allow [place] to do this test here. Please call [phone] to schedule your CT scan if it has not already been scheduled.  Best, Evlyn Clines

## 2018-01-27 NOTE — Telephone Encounter (Signed)
Pt is schedule for 2 pm 01/27/18 -EH/RMA

## 2018-01-27 NOTE — Progress Notes (Signed)
[name],  You do have a blood clot in your right lung (pulmonary embolus). This is the reason for your worsening shortness of breath. We are going to start you on a blood thinner called Eliquis.  I would recommend you come in today to review the treatment plan in person.  Best, Evlyn Clines

## 2018-01-28 ENCOUNTER — Other Ambulatory Visit: Payer: Self-pay

## 2018-01-28 DIAGNOSIS — I2699 Other pulmonary embolism without acute cor pulmonale: Secondary | ICD-10-CM

## 2018-01-28 MED ORDER — APIXABAN 5 MG PO TABS: 5.0000 mg | ORAL_TABLET | Freq: Two times a day (BID) | ORAL | 0 refills | Status: DC

## 2018-01-29 ENCOUNTER — Ambulatory Visit (INDEPENDENT_AMBULATORY_CARE_PROVIDER_SITE_OTHER): Payer: Medicare Other | Admitting: Cardiovascular Disease

## 2018-01-29 ENCOUNTER — Encounter: Payer: Self-pay | Admitting: Cardiovascular Disease

## 2018-01-29 VITALS — BP 132/62 | HR 89 | Ht 63.0 in | Wt 144.0 lb

## 2018-01-29 DIAGNOSIS — I2699 Other pulmonary embolism without acute cor pulmonale: Secondary | ICD-10-CM

## 2018-01-29 DIAGNOSIS — E7800 Pure hypercholesterolemia, unspecified: Secondary | ICD-10-CM | POA: Diagnosis not present

## 2018-01-29 DIAGNOSIS — I10 Essential (primary) hypertension: Secondary | ICD-10-CM

## 2018-01-29 DIAGNOSIS — Z86711 Personal history of pulmonary embolism: Secondary | ICD-10-CM | POA: Insufficient documentation

## 2018-01-29 HISTORY — DX: Personal history of pulmonary embolism: Z86.711

## 2018-01-29 NOTE — Assessment & Plan Note (Signed)
History of essential hypertension blood pressure measures 132/62. She is on amlodipine. Continue current meds at current dosing.

## 2018-01-29 NOTE — Patient Instructions (Addendum)
Your physician has requested that you have an echocardiogram. Echocardiography is a painless test that uses sound waves to create images of your heart. It provides your doctor with information about the size and shape of your heart and how well your heart's chambers and valves are working. This procedure takes approximately one hour. There are no restrictions for this procedure.  We request that you follow-up in: 6 weeks with an extender and in 3 months with Dr Andria Rhein will receive a reminder letter in the mail two months in advance. If you don't receive a letter, please call our office to schedule the follow-up appointment.

## 2018-01-29 NOTE — Assessment & Plan Note (Signed)
[name] was referred to me by Sherlie Ban PA-C for pulmonary embolism. She was seen on 01/27/18 with chest pain. Lab work revealed a positive d-dimer 2.9 and a CT scan that day showed right pulmonary emboli. She was begun on Eliquis oral anticoagulation and her chest pain has begun to improve.

## 2018-01-29 NOTE — Assessment & Plan Note (Signed)
History of hyperlipidemia on statin therapy with lipid profile performed 01/26/18 revealed a total cholesterol was 51, HDL 85 and tragus or level of 62.

## 2018-01-29 NOTE — Progress Notes (Signed)
01/29/2018 [name]   21-Jan-1946  [id number]  Primary Physician Donella Stade, PA-C Primary Cardiologist: Lorretta Harp MD FACP, [place], [place], [place]  HPI:  [name] is a 72 y.o. . Appearing married Caucasian female mother of 2, grandmother and 2 grandchildren accompanied by her husband Juanda Crumble. She was referred by Sherlie Ban PA-C for chest pain and pulmonary emboli. Risk factors include treated hypertension and hyperlipidemia. She has 40 pack years of tobacco abuse having quit 12 years ago. She's had lung cancer and has had a left pneumonectomy by Dr. Arlyce Dice in 2007. She's had a remote stroke was as never had a heart attack. She does have COPD. Because of chest pain she saw her primary care provider who got lab work which was notable for a mildly elevated d-dimer 2.9. This led to a CT scan of her chest for the same day revealing pulmonary emboli in the right pulmonary artery. She was begun on Eliquis oral anticoagulation which has resulted in improvement in her chest pain.    Current Meds  Medication Sig  . albuterol (PROVENTIL HFA;VENTOLIN HFA) 108 (90 Base) MCG/ACT inhaler Inhale 2 puffs into the lungs every 6 (six) hours as needed for wheezing or shortness of breath.  Marland Kitchen albuterol (PROVENTIL) (2.5 MG/3ML) 0.083% nebulizer solution Take 3 mLs (2.5 mg total) by nebulization every 6 (six) hours as needed.  . ALPRAZolam (XANAX) 1 MG tablet Take 1 tablet (1 mg total) by mouth daily as needed for anxiety.  Marland Kitchen amLODipine (NORVASC) 10 MG tablet Take 1 tablet (10 mg total) by mouth daily.  Marland Kitchen apixaban (ELIQUIS) 5 MG TABS tablet Take 1 tablet (5 mg total) by mouth 2 (two) times daily.  Marland Kitchen arformoterol (BROVANA) 15 MCG/2ML NEBU Take 2 mLs (15 mcg total) by nebulization 2 (two) times daily.  Marland Kitchen aspirin EC 81 MG tablet Take 81 mg by mouth daily.  Marland Kitchen atorvastatin (LIPITOR) 20 MG tablet Take 1 tablet (20 mg total) by mouth at bedtime. take 1 tablet by mouth once daily  . beclomethasone  (QVAR) 40 MCG/ACT inhaler Inhale 2 puffs into the lungs 2 (two) times daily.  . Fluticasone-Umeclidin-Vilant (TRELEGY ELLIPTA) 100-62.5-25 MCG/INH AEPB Inhale 1 Dose into the lungs daily.  Marland Kitchen levothyroxine (SYNTHROID, LEVOTHROID) 50 MCG tablet take 1 tablet by mouth once daily  . QUEtiapine (SEROQUEL) 100 MG tablet Take 1 tablet (100 mg total) by mouth at bedtime.  Marland Kitchen tiotropium (SPIRIVA) 18 MCG inhalation capsule Place 1 capsule (18 mcg total) into inhaler and inhale daily.  Marland Kitchen vortioxetine HBr (TRINTELLIX) 5 MG TABS Take 1 tablet (5 mg total) by mouth daily.     Allergies  Allergen Reactions  . Desvenlafaxine Succinate Er Other (See Comments)    shaking  . Lisinopril-Hydrochlorothiazide     REACTION: N/V cough  . Nitrofurantoin Monohyd Macro   . Penicillins     REACTION: Hives  . Zoloft [Sertraline Hcl] Other (See Comments)    "too many side effects"    Social History   Socioeconomic History  . Marital status: Married    Spouse name: Not on file  . Number of children: Not on file  . Years of education: Not on file  . Highest education level: Not on file  Occupational History  . Not on file  Social Needs  . Financial resource strain: Not on file  . Food insecurity:    Worry: Not on file    Inability: Not on file  . Transportation needs:  Medical: Not on file    Non-medical: Not on file  Tobacco Use  . Smoking status: Former Smoker    Packs/day: 1.50    Years: 30.00    Pack years: 45.00    Types: Cigarettes    Last attempt to quit: 10/20/2004    Years since quitting: 13.2  . Smokeless tobacco: Never Used  Substance and Sexual Activity  . Alcohol use: No  . Drug use: No  . Sexual activity: Not on file  Lifestyle  . Physical activity:    Days per week: Not on file    Minutes per session: Not on file  . Stress: Not on file  Relationships  . Social connections:    Talks on phone: Not on file    Gets together: Not on file    Attends religious service: Not on file      Active member of club or organization: Not on file    Attends meetings of clubs or organizations: Not on file    Relationship status: Not on file  . Intimate partner violence:    Fear of current or ex partner: Not on file    Emotionally abused: Not on file    Physically abused: Not on file    Forced sexual activity: Not on file  Other Topics Concern  . Not on file  Social History Narrative  . Not on file     Review of Systems: General: negative for chills, fever, night sweats or weight changes.  Cardiovascular: negative for chest pain, dyspnea on exertion, edema, orthopnea, palpitations, paroxysmal nocturnal dyspnea or shortness of breath Dermatological: negative for rash Respiratory: negative for cough or wheezing Urologic: negative for hematuria Abdominal: negative for nausea, vomiting, diarrhea, bright red blood per rectum, melena, or hematemesis Neurologic: negative for visual changes, syncope, or dizziness All other systems reviewed and are otherwise negative except as noted above.    Blood pressure 132/62, pulse 89, height 5\' 3"  (1.6 m), weight 144 lb (65.3 kg).  General appearance: alert and no distress Neck: no adenopathy, no carotid bruit, no JVD, supple, symmetrical, trachea midline and thyroid not enlarged, symmetric, no tenderness/mass/nodules Lungs: clear to auscultation bilaterally Heart: regular rate and rhythm, S1, S2 normal, no murmur, click, rub or gallop Extremities: extremities normal, atraumatic, no cyanosis or edema Pulses: 2+ and symmetric Skin: Skin color, texture, turgor normal. No rashes or lesions Neurologic: Alert and oriented X 3, normal strength and tone. Normal symmetric reflexes. Normal coordination and gait  EKG sinus rhythm at 89 with right axis deviation and nonspecific ST and T-wave changes. I personally reviewed this EKG. There were septal Q waves.  ASSESSMENT AND PLAN:   HYPERCHOLESTEROLEMIA History of hyperlipidemia on statin therapy  with lipid profile performed 01/26/18 revealed a total cholesterol was 51, HDL 85 and tragus or level of 62.  HYPERTENSION, BENIGN SYSTEMIC History of essential hypertension blood pressure measures 132/62. She is on amlodipine. Continue current meds at current dosing.  Pulmonary embolism ([place]) [name] was referred to me by Sherlie Ban PA-C for pulmonary embolism. She was seen on 01/27/18 with chest pain. Lab work revealed a positive d-dimer 2.9 and a CT scan that day showed right pulmonary emboli. She was begun on Eliquis oral anticoagulation and her chest pain has begun to improve.      Lorretta Harp MD FACP,FACC,FAHA, [facility] 01/29/2018 12:28 PM

## 2018-02-02 ENCOUNTER — Encounter: Payer: Self-pay | Admitting: Physician Assistant

## 2018-02-02 ENCOUNTER — Ambulatory Visit: Payer: Medicare Other | Admitting: Physician Assistant

## 2018-02-02 ENCOUNTER — Ambulatory Visit (INDEPENDENT_AMBULATORY_CARE_PROVIDER_SITE_OTHER): Payer: Medicare Other | Admitting: Physician Assistant

## 2018-02-02 VITALS — BP 125/63 | HR 79 | Ht 63.0 in | Wt 142.0 lb

## 2018-02-02 DIAGNOSIS — E890 Postprocedural hypothyroidism: Secondary | ICD-10-CM | POA: Diagnosis not present

## 2018-02-02 DIAGNOSIS — R0981 Nasal congestion: Secondary | ICD-10-CM | POA: Diagnosis not present

## 2018-02-02 DIAGNOSIS — I2699 Other pulmonary embolism without acute cor pulmonale: Secondary | ICD-10-CM

## 2018-02-02 DIAGNOSIS — R413 Other amnesia: Secondary | ICD-10-CM

## 2018-02-02 DIAGNOSIS — F418 Other specified anxiety disorders: Secondary | ICD-10-CM

## 2018-02-02 DIAGNOSIS — J449 Chronic obstructive pulmonary disease, unspecified: Secondary | ICD-10-CM | POA: Diagnosis not present

## 2018-02-02 NOTE — Patient Instructions (Addendum)
flonase 2 spray each nostril daily.  mucinex DM Call me on Thursday.

## 2018-02-02 NOTE — Progress Notes (Signed)
Subjective:    Patient ID: [name], female    DOB: [birth year], 72 y.o.   MRN: [id number]  HPI  Pt is a 72 yo female with recent right PE, CAD, TIA, HTN, COPD, hypothyroidism who presents to the clinic to follow up after PE dx.she was started on eliqus.   She has since been seen by cardiology, Dr. Gwenlyn Found. No changes were made. Dr. Gwenlyn Found is ordering another echo. Her SOB and CP is improving slowly.   COPD- pt was started on Trelegy. First couple of days she felt more SOB but now she is tolerating more. She has stopped Qvar/spirivia. She needs sent to meds by mail.   Depression and anxiety doing great. No problems or concerns. Memory is doing well.   .. Active Ambulatory Problems    Diagnosis Date Noted  . History of lung cancer 07/28/2006  . HYPOTHYROIDISM, POSTSURGICAL 05/19/2007  . HYPERCHOLESTEROLEMIA 07/28/2006  . ANEMIA, IRON DEFICIENCY NOS 01/04/2007  . Major depressive disorder, recurrent episode ([place]) 07/28/2006  . Anxiety state 07/28/2006  . HYPERTENSION, BENIGN SYSTEMIC 07/28/2006  . DISEASE, ISCHEMIC HEART, CHRONIC NOS 05/19/2007  . PERIPHERAL VASCULAR DISEASE, UNSPEC. 07/28/2006  . COPD with chronic bronchitis 07/28/2006  . Hypoxemia 06/22/2013  . History of hip fracture 09/02/2013  . Insomnia 09/04/2013  . GERD (gastroesophageal reflux disease) 09/04/2013  . Sensorineural hearing loss of both ears 11/18/2013  . Systolic murmur 08/67/6195  . Episode of abnormal behavior 11/28/2013  . Word finding difficulty 11/28/2013  . Memory loss 11/28/2013  . Transient cerebral ischemic attack 03/01/2014  . Carotid artery narrowing 02/27/2014  . Clinical depression 02/27/2014  . Cerebrovascular accident, old 06/14/2013  . IFG (impaired fasting glucose) 04/12/2015  . Elevated blood pressure 10/16/2015  . Split S2 (second heart sound) 10/16/2015  . Anxiety as acute reaction to exceptional stress 12/05/2015  . Muscle spasm of back 12/05/2015  . Memory changes 12/05/2015   . Osteopenia 04/30/2016  . Chronic pain of left ankle 12/16/2016  . Left foot pain 02/17/2017  . Cavus foot, acquired 04/28/2017  . Depression with anxiety 08/01/2017  . Lung cancer ([place]) 08/01/2017  . Exertional chest pain 01/26/2018  . Pulmonary embolism ([place]) 01/29/2018   Resolved Ambulatory Problems    Diagnosis Date Noted  . THRUSH 09/26/2010  . Abdominal bloating 03/04/2011  .  Chronic Bronchopleural fistula    Past Medical History:  Diagnosis Date  . 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 CAD (coronary artery disease)   . Cancer ([place]) 2007  . COPD (chronic obstructive pulmonary disease) ([place]) 9/10  . Hurthle cell adenoma of thyroid 2007  . Hypertension   . Iron deficiency anemia 2/08  . Lung cancer ([place])   . Peripheral vascular disease ([place])   . RLS (restless legs syndrome)    .Marland Kitchen Family History  Problem Relation Age of Onset  . Cancer Mother        Larynx & Endometrial cancer  . Cancer Father        laryngeal cancer  . Hypertension Father   . Diabetes Sister   . Multiple sclerosis Sister   . Cancer Sister 68       breast     Review of Systems See HPI.     Objective:   Physical Exam  Constitutional: She is oriented to person, place, and time. She appears well-developed and well-nourished.  HENT:  Head: Normocephalic and atraumatic.  Right Ear: External ear normal.  Left Ear: External ear normal.  Mouth/Throat: Oropharynx  is clear and moist. No oropharyngeal exudate.  TM's clear.  Bilateral nasal turbinates red and swollen.  No tenderness over sinuses to palpation.    Eyes: Pupils are equal, round, and reactive to light. Conjunctivae and EOM are normal.  Neck: Normal range of motion. Neck supple.  Cardiovascular: Normal rate, regular rhythm and normal heart sounds.  Pulmonary/Chest: Effort normal and breath sounds normal. She has no wheezes.  Lymphadenopathy:    She has no cervical adenopathy.  Neurological: She is alert and oriented to person, place,  and time.  Skin: Skin is dry.  Psychiatric: She has a normal mood and affect. Her behavior is normal.          Assessment & Plan:  Marland KitchenMarland KitchenDiagnoses and all orders for this visit:  Other acute pulmonary embolism without acute cor pulmonale (HCC)  COPD with chronic bronchitis -     Fluticasone-Umeclidin-Vilant (TRELEGY ELLIPTA) 100-62.5-25 MCG/INH AEPB; Inhale 1 Dose into the lungs daily.  Depression with anxiety -     vortioxetine HBr (TRINTELLIX) 5 MG TABS tablet; Take 1 tablet (5 mg total) by mouth daily.  Memory changes -     vortioxetine HBr (TRINTELLIX) 5 MG TABS tablet; Take 1 tablet (5 mg total) by mouth daily.  HYPOTHYROIDISM, POSTSURGICAL  Nasal congestion  Other orders -     levothyroxine (SYNTHROID, LEVOTHROID) 50 MCG tablet; take 1 tablet by mouth once daily   Pt is improving. BP looks great. SOB better. Continue on elliqius.  Trelegy is a once daily inhaler and can improve compliance and function. I would continue to follow up with pulmonology. Given coupon card today.   Refilled trintellix.  Refilled synthroid.   Pt is a little congested and some minor sinus pressure. Start flonase 2 spray each nostril with mucinex. If not improving or worsening please call office and/or follow up. humidifier could also be helpful.   Follow up in 3 months.

## 2018-02-03 ENCOUNTER — Encounter: Payer: Self-pay | Admitting: Physician Assistant

## 2018-02-03 MED ORDER — LEVOTHYROXINE SODIUM 50 MCG PO TABS: ORAL_TABLET | ORAL | 1 refills | Status: DC

## 2018-02-03 MED ORDER — FLUTICASONE-UMECLIDIN-VILANT 100-62.5-25 MCG/INH IN AEPB: 1.0000 | INHALATION_SPRAY | Freq: Every day | RESPIRATORY_TRACT | 4 refills | Status: DC

## 2018-02-03 MED ORDER — VORTIOXETINE HBR 5 MG PO TABS: 5.0000 mg | ORAL_TABLET | Freq: Every day | ORAL | 1 refills | Status: DC

## 2018-02-05 ENCOUNTER — Other Ambulatory Visit: Payer: Self-pay

## 2018-02-05 ENCOUNTER — Ambulatory Visit (HOSPITAL_COMMUNITY): Payer: Medicare Other | Attending: Cardiovascular Disease

## 2018-02-05 DIAGNOSIS — I10 Essential (primary) hypertension: Secondary | ICD-10-CM | POA: Diagnosis not present

## 2018-02-05 DIAGNOSIS — I2510 Atherosclerotic heart disease of native coronary artery without angina pectoris: Secondary | ICD-10-CM | POA: Insufficient documentation

## 2018-02-05 DIAGNOSIS — E785 Hyperlipidemia, unspecified: Secondary | ICD-10-CM | POA: Diagnosis not present

## 2018-02-05 DIAGNOSIS — I2699 Other pulmonary embolism without acute cor pulmonale: Secondary | ICD-10-CM | POA: Diagnosis not present

## 2018-02-05 DIAGNOSIS — J449 Chronic obstructive pulmonary disease, unspecified: Secondary | ICD-10-CM | POA: Diagnosis not present

## 2018-02-25 ENCOUNTER — Encounter: Payer: Self-pay | Admitting: Sports Medicine

## 2018-02-25 ENCOUNTER — Ambulatory Visit (INDEPENDENT_AMBULATORY_CARE_PROVIDER_SITE_OTHER): Payer: Medicare Other | Admitting: Sports Medicine

## 2018-02-25 DIAGNOSIS — I2699 Other pulmonary embolism without acute cor pulmonale: Secondary | ICD-10-CM | POA: Diagnosis not present

## 2018-02-25 DIAGNOSIS — M19041 Primary osteoarthritis, right hand: Secondary | ICD-10-CM | POA: Diagnosis not present

## 2018-02-25 DIAGNOSIS — I878 Other specified disorders of veins: Secondary | ICD-10-CM

## 2018-02-25 DIAGNOSIS — M19042 Primary osteoarthritis, left hand: Secondary | ICD-10-CM

## 2018-02-25 MED ORDER — DICLOFENAC SODIUM 1 % TD GEL: 2.0000 g | Freq: Four times a day (QID) | TRANSDERMAL | 11 refills | Status: DC

## 2018-02-25 NOTE — Assessment & Plan Note (Signed)
I think the subjective leg swelling is due to venous stasis. She will use her lower extremity compression hose. Recent lab work including a BNP was negative, weight is the same. Return in a couple of weeks for further evaluation. Blood pressure is elevated so if still elevated at a 2-week nurse visit we will likely add a low-dose hydrochlorothiazide.

## 2018-02-25 NOTE — Assessment & Plan Note (Signed)
Continue with Eliquis. Persistent baseline shortness of breath on her current inhalers. I would like to get her back in with Dr. Asencion Noble her previous pulmonologist.

## 2018-02-25 NOTE — Patient Instructions (Signed)

## 2018-02-25 NOTE — Assessment & Plan Note (Signed)
Topical Voltaren. Return as needed.

## 2018-02-25 NOTE — Progress Notes (Signed)
Subjective:    I'm seeing this patient as a consultation for: Iran Planas, PA-C  CC: Swelling in legs  HPI: This is a pleasant 72 year old female, she is status post partial left pneumonectomy, likely upper lobectomy, for lung cancer.  Since then she has been treated for COPD, on multiple inhalers, Brovana and Trelegy.  More recently she developed a pulmonary embolism, currently on Eliquis.  She has noted some swelling in her legs, left and right, symmetric without pain.  Recent echocardiogram showed a normal ejection fraction, normal BNP.  Normal renal function.  No diabetes.  She is euvolemic, weight has been the same over the past several visits.  No paroxysmal nocturnal dyspnea, orthopnea.  She is to wear lower extremity compression hose.  He also notes some pain and swelling in her hands, mostly at the PIPs in both hands.  Moderate swelling, gelling.  I reviewed the past medical history, family history, social history, surgical history, and allergies today and no changes were needed.  Please see the problem list section below in epic for further details.  Past Medical History: Past Medical History:  Diagnosis Date  . 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 CAD (coronary artery disease)    20% stenosis on cath - no intervention required (W-S cards), negative nuclear stress test 11/07  . Cancer [facility]) 2007   lung (Dr. Earlie Server and Dr. Arlyce Dice)  . COPD (chronic obstructive pulmonary disease) ([place]) 9/10   golds stage III, FeV1 39%  . Hurthle cell adenoma of thyroid 2007  . Hypertension   . Iron deficiency anemia 2/08   s/p 2 unit transfusion  . Lung cancer ([place])   . Peripheral vascular disease ([place])   . RLS (restless legs syndrome)    Past Surgical History: Past Surgical History:  Procedure Laterality Date  . APPENDECTOMY  18  . CHOLECYSTECTOMY  18  . heart cartherization    . LUL  2005   LUL wedge resection/VATS  . LUL  4/08   LUL lobectomy for cystic cavity and Candida, no  cancer seen  . THYROIDECTOMY, PARTIAL  2007   Dr. Arlyce Dice   Social History: Social History   Socioeconomic History  . Marital status: Married    Spouse name: Not on file  . Number of children: Not on file  . Years of education: Not on file  . Highest education level: Not on file  Occupational History  . Not on file  Social Needs  . Financial resource strain: Not on file  . Food insecurity:    Worry: Not on file    Inability: Not on file  . Transportation needs:    Medical: Not on file    Non-medical: Not on file  Tobacco Use  . Smoking status: Former Smoker    Packs/day: 1.50    Years: 30.00    Pack years: 45.00    Types: Cigarettes    Last attempt to quit: 10/20/2004    Years since quitting: 13.3  . Smokeless tobacco: Never Used  Substance and Sexual Activity  . Alcohol use: No  . Drug use: No  . Sexual activity: Not on file  Lifestyle  . Physical activity:    Days per week: Not on file    Minutes per session: Not on file  . Stress: Not on file  Relationships  . Social connections:    Talks on phone: Not on file    Gets together: Not on file    Attends religious service: Not on  file    Active member of club or organization: Not on file    Attends meetings of clubs or organizations: Not on file    Relationship status: Not on file  Other Topics Concern  . Not on file  Social History Narrative  . Not on file   Family History: Family History  Problem Relation Age of Onset  . Cancer Mother        Larynx & Endometrial cancer  . Cancer Father        laryngeal cancer  . Hypertension Father   . Diabetes Sister   . Multiple sclerosis Sister   . Cancer Sister 22       breast   Allergies: Allergies  Allergen Reactions  . Desvenlafaxine Succinate Er Other (See Comments)    shaking  . Lisinopril-Hydrochlorothiazide     REACTION: N/V cough  . Nitrofurantoin Monohyd Macro   . Penicillins     REACTION: Hives  . Zoloft [Sertraline Hcl] Other (See Comments)     "too many side effects"   Medications: See med rec.  Review of Systems: No headache, visual changes, nausea, vomiting, diarrhea, constipation, dizziness, abdominal pain, skin rash, fevers, chills, night sweats, weight loss, swollen lymph nodes, body aches, joint swelling, muscle aches, chest pain, shortness of breath, mood changes, visual or auditory hallucinations.   Objective:   General: Well Developed, well nourished, and in no acute distress.  Neuro:  Extra-ocular muscles intact, able to move all 4 extremities, sensation grossly intact.  Deep tendon reflexes tested were normal. Psych: Alert and oriented, mood congruent with affect. ENT:  Ears and nose appear unremarkable.  Hearing grossly normal. Neck: Unremarkable overall appearance, trachea midline.  No visible thyroid enlargement. Eyes: Conjunctivae and lids appear unremarkable.  Pupils equal and round. Skin: Warm and dry, no rashes noted.  Cardiovascular: Pulses palpable, no extremity edema.  No murmurs rubs or gallops, regular rate, regular rhythm. Pulmonary: Somewhat poor air movement throughout. Legs: No palpable edema, negative Homans sign bilaterally, normal dorsalis and posterior tibial pulses.  Multiple spider veins and varicosities present.  Impression and Recommendations:   This case required medical decision making of moderate complexity.  Venous stasis of lower extremity I think the subjective leg swelling is due to venous stasis. She will use her lower extremity compression hose. Recent lab work including a BNP was negative, weight is the same. Return in a couple of weeks for further evaluation. Blood pressure is elevated so if still elevated at a 2-week nurse visit we will likely add a low-dose hydrochlorothiazide.  Primary osteoarthritis of both hands Topical Voltaren. Return as needed.  Pulmonary embolism ([place]) Continue with Eliquis. Persistent baseline shortness of breath on her current inhalers. I would  like to get her back in with Dr. Asencion Noble her previous pulmonologist.  I spent 25 minutes with this patient, greater than 50% was face-to-face time counseling regarding the above diagnoses ___________________________________________ Gwen Her. Dianah Field, M.D., ABFM., CAQSM. Primary Care and [place] Instructor of [place] [employer] [facility] of [employer]

## 2018-03-02 ENCOUNTER — Telehealth: Payer: Self-pay | Admitting: Emergency Medicine

## 2018-03-02 NOTE — Telephone Encounter (Signed)
Patient calling to remind Dr.T that he promised to connect her with Dr.Wright; she does not want to go to her pulmonologist on record for her annual appt. In 7/19.

## 2018-03-02 NOTE — Telephone Encounter (Signed)
Forwarding to [place], she specifically wanted Dr. Lyda Jester.  Would you change it and see if that's possible.  She specifically didn't want to see Dr. Lamonte Sakai.

## 2018-03-04 NOTE — Telephone Encounter (Signed)
Ok please let patient know Dr. Joya Gaskins no longer works and that its going to be Dr. Lamonte Sakai.  Nothing we can do.

## 2018-03-04 NOTE — Telephone Encounter (Signed)
Dr. Darene Lamer   Dr. Lyda Jester is no longer at [facility] and they believe he took an administrative job at the hospital so I am unable to submit a request to get the patient switched to him.

## 2018-03-05 ENCOUNTER — Ambulatory Visit: Payer: Medicare Other | Admitting: Cardiology

## 2018-03-11 ENCOUNTER — Ambulatory Visit (INDEPENDENT_AMBULATORY_CARE_PROVIDER_SITE_OTHER): Payer: Medicare Other | Admitting: Sports Medicine

## 2018-03-11 VITALS — BP 136/60 | HR 74 | Wt 146.0 lb

## 2018-03-11 DIAGNOSIS — I10 Essential (primary) hypertension: Secondary | ICD-10-CM

## 2018-03-11 MED ORDER — VALSARTAN 160 MG PO TABS: 160.0000 mg | ORAL_TABLET | Freq: Every day | ORAL | 3 refills | Status: DC

## 2018-03-11 NOTE — Assessment & Plan Note (Signed)
Complaint is lower extreme the swelling, this is likely from the amlodipine 10, and persistent in spite of compression hose. Discontinue amlodipine, switching to valsartan 160, return in 2 weeks to see PCP for blood pressure check. Renal function is adequate. Cough with ACE inhibitors.

## 2018-03-11 NOTE — Progress Notes (Signed)
Pt came into clinic today for BP check. Denies any chest pain or SOB. Pt does report having some bilateral lower extremity edema. Pt states the point of today's visit is to see if she gets a fluid pill or not. She has not had any water, and has not taken her Rx's yet today. Per provider, Rx to be changed from amlodpidine to valsartan. Pt to follow up with PCP in 2 weeks. Pt advised. No further questions.   Vitals:   03/11/18 1008 03/11/18 1025  BP: (!) 140/55 136/60  Pulse: 75 74  Weight: 146 lb (66.2 kg)

## 2018-03-18 ENCOUNTER — Ambulatory Visit (INDEPENDENT_AMBULATORY_CARE_PROVIDER_SITE_OTHER): Payer: Medicare Other | Admitting: Adult Health

## 2018-03-18 ENCOUNTER — Encounter: Payer: Self-pay | Admitting: Adult Health

## 2018-03-18 VITALS — BP 142/82 | HR 69 | Ht 63.0 in | Wt 143.4 lb

## 2018-03-18 DIAGNOSIS — E7800 Pure hypercholesterolemia, unspecified: Secondary | ICD-10-CM | POA: Diagnosis not present

## 2018-03-18 DIAGNOSIS — I2699 Other pulmonary embolism without acute cor pulmonale: Secondary | ICD-10-CM

## 2018-03-18 DIAGNOSIS — I10 Essential (primary) hypertension: Secondary | ICD-10-CM

## 2018-03-18 NOTE — Patient Instructions (Signed)
Medication Instructions:  NO CHANGES- Your physician recommends that you continue on your current medications as directed. Please refer to the Current Medication list given to you today.  If you need a refill on your cardiac medications before your next appointment, please call your pharmacy.  Follow-Up: Your physician wants you to follow-up in: IN 6 MONTHS WITH DR Gwenlyn Found You should receive a reminder letter in the mail two months in advance. If you do not receive a letter, please call our office SEPT 2019 to schedule the NOV 2019 follow-up appointment.   Thank you for choosing [facility] at [facility]!!

## 2018-03-18 NOTE — Progress Notes (Signed)
Cardiology Office Note   Date:  03/18/2018   ID:  [name], DOB November 30, 1945, MRN [id number]  PCP:  Lavada Mesi  Cardiologist:  [facility]  Chief Complaint  Patient presents with  . Follow-up    SOB noted, chest pain from breathing, denies swelling in hands/feet     History of Present Illness: [name] is a 72 y.o. female who presents for ongoing assessment and management of chest pain after being seen as a new patient by Dr. Gwenlyn Found in April of 2019. She has a hx of PE, hypertension, and hyperlipidemia, prior tobacco abuse. She has also had a history of lung cancer with hx of left pneumonectomy.COPD. She has been on Eliquis for PE.   She comes today without cardiac complaint.  She has had worsening dyspnea and is to be seen by a pulmonologist within the next couple of weeks for ongoing management of COPD.  She denies chest pain weakness or fatigue.  She denies any bleeding, melena, or hemoptysis.  Past Medical History:  Diagnosis Date  . 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 CAD (coronary artery disease)    20% stenosis on cath - no intervention required (W-S cards), negative nuclear stress test 11/07  . Cancer [facility]) 2007   lung (Dr. Earlie Server and Dr. Arlyce Dice)  . COPD (chronic obstructive pulmonary disease) ([place]) 9/10   golds stage III, FeV1 39%  . Hurthle cell adenoma of thyroid 2007  . Hypertension   . Iron deficiency anemia 2/08   s/p 2 unit transfusion  . Lung cancer ([place])   . Peripheral vascular disease ([place])   . RLS (restless legs syndrome)     Past Surgical History:  Procedure Laterality Date  . APPENDECTOMY  18  . CHOLECYSTECTOMY  18  . heart cartherization    . LUL  2005   LUL wedge resection/VATS  . LUL  4/08   LUL lobectomy for cystic cavity and Candida, no cancer seen  . THYROIDECTOMY, PARTIAL  2007   Dr. Arlyce Dice     Current Outpatient Medications  Medication Sig Dispense Refill  . albuterol (PROVENTIL HFA;VENTOLIN HFA) 108 (90 Base) MCG/ACT  inhaler Inhale 2 puffs into the lungs every 6 (six) hours as needed for wheezing or shortness of breath. 3 Inhaler 3  . albuterol (PROVENTIL) (2.5 MG/3ML) 0.083% nebulizer solution Take 3 mLs (2.5 mg total) by nebulization every 6 (six) hours as needed. 1080 mL 3  . ALPRAZolam (XANAX) 1 MG tablet Take 1 tablet (1 mg total) by mouth daily as needed for anxiety. 90 tablet 1  . apixaban (ELIQUIS) 5 MG TABS tablet Take 1 tablet (5 mg total) by mouth 2 (two) times daily. 180 tablet 0  . arformoterol (BROVANA) 15 MCG/2ML NEBU Take 2 mLs (15 mcg total) by nebulization 2 (two) times daily. 360 mL 4  . aspirin EC 81 MG tablet Take 81 mg by mouth daily.    Marland Kitchen atorvastatin (LIPITOR) 20 MG tablet Take 1 tablet (20 mg total) by mouth at bedtime. take 1 tablet by mouth once daily 90 tablet 4  . Fluticasone-Umeclidin-Vilant (TRELEGY ELLIPTA) 100-62.5-25 MCG/INH AEPB Inhale 1 Dose into the lungs daily. 84 each 4  . levothyroxine (SYNTHROID, LEVOTHROID) 50 MCG tablet take 1 tablet by mouth once daily 90 tablet 1  . QUEtiapine (SEROQUEL) 100 MG tablet Take 1 tablet (100 mg total) by mouth at bedtime. 90 tablet 1  . valsartan (DIOVAN) 160 MG tablet Take 1 tablet (160 mg total) by mouth  daily. 30 tablet 3  . vortioxetine HBr (TRINTELLIX) 5 MG TABS tablet Take 1 tablet (5 mg total) by mouth daily. 90 tablet 1  . diclofenac sodium (VOLTAREN) 1 % GEL Apply 2 g topically 4 (four) times daily. To affected joint. (Patient not taking: Reported on 03/18/2018) 100 g 11   No current facility-administered medications for this visit.     Allergies:   Desvenlafaxine succinate er; Lisinopril-hydrochlorothiazide; Nitrofurantoin monohyd macro; Penicillins; and Zoloft [sertraline hcl]    Social History:  The patient  reports that she quit smoking about 13 years ago. Her smoking use included cigarettes. She has a 45.00 pack-year smoking history. She has never used smokeless tobacco. She reports that she does not drink alcohol or use  drugs.   Family History:  The patient's family history includes Cancer in her father and mother; Cancer (age of onset: 61) in her sister; Diabetes in her sister; Hypertension in her father; Multiple sclerosis in her sister.    ROS: All other systems are reviewed and negative. Unless otherwise mentioned in H&P    PHYSICAL EXAM: VS:  BP (!) 142/82 (BP Location: Left Arm)   Pulse 69   Ht 5\' 3"  (1.6 m)   Wt 143 lb 6.4 oz (65 kg)   BMI 25.40 kg/m  , BMI Body mass index is 25.4 kg/m. GEN: Well nourished, well developed, in no acute distress  HEENT: normal  Neck: no JVD, carotid bruits, or masses Cardiac:RRR; no murmurs, rubs, or gallops,no edema  Respiratory: Expiratory wheezes, some crackles in the left base, no rhonchi or coughing. GI: soft, nontender, nondistended, + BS MS: no deformity or atrophy  Skin: warm and dry, no rash Neuro:  Strength and sensation are intact Psych: euthymic mood, full affect   EKG:  Not completed during office visit.   Recent Labs: 04/28/2017: TSH 0.99 01/26/2018: ALT 14; Brain Natriuretic Peptide 32; BUN 19; Creat 0.80; Hemoglobin 13.2; Platelets 310; Potassium 3.9; Sodium 140    Lipid Panel    Component Value Date/Time   CHOL 161 01/26/2018 1346   TRIG 62 01/26/2018 1346   HDL 85 01/26/2018 1346   CHOLHDL 1.9 01/26/2018 1346   VLDL 11 04/28/2017 1507   LDLCALC 62 01/26/2018 1346   LDLDIRECT 98 02/18/2007 0021      Wt Readings from Last 3 Encounters:  03/18/18 143 lb 6.4 oz (65 kg)  03/11/18 146 lb (66.2 kg)  02/25/18 142 lb (64.4 kg)      Other studies Reviewed:  Echocardiogram 02/05/2018 Left ventricle: The cavity size was normal. Systolic function was   normal. The estimated ejection fraction was in the range of 60%   to 65%. Wall motion was normal; there were no regional wall   motion abnormalities. Left ventricular diastolic function   parameters were normal. - Aortic valve: Nodular calcification of the non coronary cusp. -  Atrial septum: No defect or patent foramen ovale was identified.   ASSESSMENT AND PLAN:   1. Chest Pain; without any complaints of chest pain at this time.  She continues to have some dyspnea which does cause some mild pressure when she exerts herself.  The patient will not have any further cardiac testing for ischemia at this time.  2. PE: Continues on anticoagulation therapy with Eliquis.   3. COPD: She is to see Dr. Lamonte Sakai for recommendation and ongoing treatment.   4. Hypertension:  Elevated on this office visit, but normally lower. Will follow before adding additional agents. Continue losartan. Consider adding  HCTZ or chlorthalidone if remains elevated.   5. Hyercholesterolemia: Continue statin therapy. Labs per PCP   Current medicines are reviewed at length with the patient today.    Labs/ tests ordered today include: None   Phill Myron. West Pugh, ANP, AACC   03/18/2018 12:25 PM    [facility] [address], [place], [place] [address] Phone: [phone]; Fax: [phone]

## 2018-03-26 ENCOUNTER — Ambulatory Visit: Payer: Medicare Other | Admitting: Physician Assistant

## 2018-03-31 ENCOUNTER — Ambulatory Visit (INDEPENDENT_AMBULATORY_CARE_PROVIDER_SITE_OTHER): Payer: Medicare Other | Admitting: Sports Medicine

## 2018-03-31 ENCOUNTER — Ambulatory Visit (INDEPENDENT_AMBULATORY_CARE_PROVIDER_SITE_OTHER): Payer: Medicare Other | Admitting: Physician Assistant

## 2018-03-31 ENCOUNTER — Encounter: Payer: Self-pay | Admitting: Sports Medicine

## 2018-03-31 ENCOUNTER — Encounter: Payer: Self-pay | Admitting: Physician Assistant

## 2018-03-31 VITALS — BP 145/59 | HR 89 | Ht 63.0 in | Wt 144.0 lb

## 2018-03-31 DIAGNOSIS — R413 Other amnesia: Secondary | ICD-10-CM | POA: Diagnosis not present

## 2018-03-31 DIAGNOSIS — R4789 Other speech disturbances: Secondary | ICD-10-CM | POA: Diagnosis not present

## 2018-03-31 DIAGNOSIS — E7800 Pure hypercholesterolemia, unspecified: Secondary | ICD-10-CM

## 2018-03-31 DIAGNOSIS — R0902 Hypoxemia: Secondary | ICD-10-CM

## 2018-03-31 DIAGNOSIS — J441 Chronic obstructive pulmonary disease with (acute) exacerbation: Secondary | ICD-10-CM | POA: Diagnosis not present

## 2018-03-31 DIAGNOSIS — I10 Essential (primary) hypertension: Secondary | ICD-10-CM | POA: Diagnosis not present

## 2018-03-31 DIAGNOSIS — F330 Major depressive disorder, recurrent, mild: Secondary | ICD-10-CM

## 2018-03-31 MED ORDER — VALSARTAN 320 MG PO TABS: 320.0000 mg | ORAL_TABLET | Freq: Every day | ORAL | 3 refills | Status: DC

## 2018-03-31 MED ORDER — VORTIOXETINE HBR 10 MG PO TABS: 10.0000 mg | ORAL_TABLET | Freq: Every day | ORAL | 1 refills | Status: DC

## 2018-03-31 MED ORDER — PREDNISONE 50 MG PO TABS: ORAL_TABLET | ORAL | 0 refills | Status: DC

## 2018-03-31 NOTE — Progress Notes (Signed)
Subjective:    CC: Recheck blood pressure  HPI: This is a pleasant 72 year old female, she was having significant leg pain and swelling, we discontinued her amlodipine and switch to valsartan, symptoms have improved completely.  Blood pressure is still a bit elevated, no headaches, visual changes, chest pain.  I reviewed the past medical history, family history, social history, surgical history, and allergies today and no changes were needed.  Please see the problem list section below in epic for further details.  Past Medical History: Past Medical History:  Diagnosis Date  . 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 CAD (coronary artery disease)    20% stenosis on cath - no intervention required (W-S cards), negative nuclear stress test 11/07  . Cancer [facility]) 2007   lung (Dr. Earlie Server and Dr. Arlyce Dice)  . COPD (chronic obstructive pulmonary disease) ([place]) 9/10   golds stage III, FeV1 39%  . Hurthle cell adenoma of thyroid 2007  . Hypertension   . Iron deficiency anemia 2/08   s/p 2 unit transfusion  . Lung cancer ([place])   . Peripheral vascular disease ([place])   . RLS (restless legs syndrome)    Past Surgical History: Past Surgical History:  Procedure Laterality Date  . APPENDECTOMY  18  . CHOLECYSTECTOMY  18  . heart cartherization    . LUL  2005   LUL wedge resection/VATS  . LUL  4/08   LUL lobectomy for cystic cavity and Candida, no cancer seen  . THYROIDECTOMY, PARTIAL  2007   Dr. Arlyce Dice   Social History: Social History   Socioeconomic History  . Marital status: Married    Spouse name: Not on file  . Number of children: Not on file  . Years of education: Not on file  . Highest education level: Not on file  Occupational History  . Not on file  Social Needs  . Financial resource strain: Not on file  . Food insecurity:    Worry: Not on file    Inability: Not on file  . Transportation needs:    Medical: Not on file    Non-medical: Not on file  Tobacco Use  . Smoking  status: Former Smoker    Packs/day: 1.50    Years: 30.00    Pack years: 45.00    Types: Cigarettes    Last attempt to quit: 10/20/2004    Years since quitting: 13.4  . Smokeless tobacco: Never Used  Substance and Sexual Activity  . Alcohol use: No  . Drug use: No  . Sexual activity: Not on file  Lifestyle  . Physical activity:    Days per week: Not on file    Minutes per session: Not on file  . Stress: Not on file  Relationships  . Social connections:    Talks on phone: Not on file    Gets together: Not on file    Attends religious service: Not on file    Active member of club or organization: Not on file    Attends meetings of clubs or organizations: Not on file    Relationship status: Not on file  Other Topics Concern  . Not on file  Social History Narrative  . Not on file   Family History: Family History  Problem Relation Age of Onset  . Cancer Mother        Larynx & Endometrial cancer  . Cancer Father        laryngeal cancer  . Hypertension Father   . Diabetes Sister   .  Multiple sclerosis Sister   . Cancer Sister 38       breast   Allergies: Allergies  Allergen Reactions  . Desvenlafaxine Succinate Er Other (See Comments)    shaking  . Lisinopril-Hydrochlorothiazide     REACTION: N/V cough  . Nitrofurantoin Monohyd Macro   . Penicillins     REACTION: Hives  . Zoloft [Sertraline Hcl] Other (See Comments)    "too many side effects"   Medications: See med rec.  Review of Systems: No fevers, chills, night sweats, weight loss, chest pain, or shortness of breath.   Objective:    General: Well Developed, well nourished, and in no acute distress.  Neuro: Alert and oriented x3, extra-ocular muscles intact, sensation grossly intact.  HEENT: Normocephalic, atraumatic, pupils equal round reactive to light, neck supple, no masses, no lymphadenopathy, thyroid nonpalpable.  Skin: Warm and dry, no rashes. Cardiac: Regular rate and rhythm, no murmurs rubs or  gallops, no lower extremity edema.  Respiratory: Clear to auscultation bilaterally. Not using accessory muscles, speaking in full sentences.  Impression and Recommendations:    HYPERTENSION, BENIGN SYSTEMIC Resolution of leg discomfort and swelling with discontinuing amlodipine, blood pressure is okay with valsartan but we still have some work to do. Increasing valsartan to 320 mg, if insufficient improvement in blood pressure at the 2-week nurse visit blood pressure check we will add hydrochlorothiazide as a combo pill. ___________________________________________ Gwen Her. Dianah Field, M.D., ABFM., CAQSM. Primary Care and [place] Instructor of [place] of [facility] of Medicine

## 2018-03-31 NOTE — Patient Instructions (Signed)
Look into Cymbalta for husband

## 2018-03-31 NOTE — Assessment & Plan Note (Signed)
Resolution of leg discomfort and swelling with discontinuing amlodipine, blood pressure is okay with valsartan but we still have some work to do. Increasing valsartan to 320 mg, if insufficient improvement in blood pressure at the 2-week nurse visit blood pressure check we will add hydrochlorothiazide as a combo pill.

## 2018-03-31 NOTE — Patient Instructions (Addendum)
Increased trintellix. Start prednisone for 5 days.  Follow up with pulmonology.

## 2018-03-31 NOTE — Progress Notes (Addendum)
Subjective:    Patient ID: [name], female    DOB: 1946/01/12, 72 y.o.   MRN: [id number]  HPI Patient is a 72 yo female with a pmhx of PE, CAD, TIA, HTN, COPD, and hypothyroidism who presents to the clinic for follow up on COPD medications. She saw Dr. Dianah Field today as well for leg pain.  Hypertension - At her visit with Dr. Darene Lamer her BP was elevated to 164/78. He increased her Valsartan from 160 mg to 320 mg. Her BP was rechecked at 145/59. She checks BP at home and her systolic pressure is usually 140-160.  COPD - being managed by pulmonology following her PE. Her next appointment is 04/13/18 and she is unsure if she will be able to make it due to her husband having a surgery. She is taking her Trelegy and Brovana as prescribed. She has some central and high epigastric chest pain for about 1 hour after taking these medications. She reports worsening of symptoms and SHOB with movement but not at rest. She reports episodes of sweating and having to stop walking due to [facility]. It is hindering her activities of daily living. She is using her albuterol inhaler 3-4 times a day and her albuterol nebulizer twice a day. She has a pulse ox at home stays around 93% which is her level in the clinic today. She heard about blood gas testing from a friend and wants to know if that's an option for her.  Depression/Memory - She feels she is doing well on her current medications. She would like her Trintellix increased from 5mg  to 10mg  for her memory as well as her depression. She feels like she is just not thinking as well as she was.   She would like her fasting lipids faxed to her cardiology provider.   Review of Systems  All other systems reviewed and are negative.      Objective:   Physical Exam  Constitutional: She is oriented to person, place, and time. She appears well-developed and well-nourished.  HENT:  Head: Normocephalic and atraumatic.  Cardiovascular: Normal rate, regular rhythm and  normal heart sounds.  Pulmonary/Chest: Effort normal and breath sounds normal.  Wheezes auscultated after walk test. Increased pulmonary effort and SHOB with walking.  Neurological: She is alert and oriented to person, place, and time.  Skin: Skin is warm and dry.  Psychiatric: She has a normal mood and affect. Her behavior is normal.  Vitals reviewed.    . Depression screen [facility] 2/9 02/02/2018 09/09/2017 07/29/2017 04/28/2017 05/27/2016  Decreased Interest 1 0 0 0 0  Down, Depressed, Hopeless 1 0 0 3 0  PHQ - 2 Score 2 0 0 3 0  Altered sleeping 0 0 0 0 0  Tired, decreased energy 1 0 0 1 0  Change in appetite 0 0 0 0 0  Feeling bad or failure about yourself  0 0 0 3 0  Trouble concentrating 0 0 0 3 1  Moving slowly or fidgety/restless 1 1 0 3 0  Suicidal thoughts 0 0 0 0 0  PHQ-9 Score 4 1 0 13 1  Difficult doing work/chores Not difficult at all Not difficult at all Not difficult at all - -   .Marland Kitchen Active Ambulatory Problems    Diagnosis Date Noted  . History of lung cancer 07/28/2006  . HYPOTHYROIDISM, POSTSURGICAL 05/19/2007  . HYPERCHOLESTEROLEMIA 07/28/2006  . ANEMIA, IRON DEFICIENCY NOS 01/04/2007  . Major depressive disorder, recurrent episode ([place]) 07/28/2006  . Anxiety  state 07/28/2006  . HYPERTENSION, BENIGN SYSTEMIC 07/28/2006  . DISEASE, ISCHEMIC HEART, CHRONIC NOS 05/19/2007  . PERIPHERAL VASCULAR DISEASE, UNSPEC. 07/28/2006  . COPD with chronic bronchitis 07/28/2006  . Hypoxemia 06/22/2013  . History of hip fracture 09/02/2013  . Insomnia 09/04/2013  . GERD (gastroesophageal reflux disease) 09/04/2013  . Sensorineural hearing loss of both ears 11/18/2013  . Systolic murmur 46/50/3546  . Episode of abnormal behavior 11/28/2013  . Word finding difficulty 11/28/2013  . Memory loss 11/28/2013  . Transient cerebral ischemic attack 03/01/2014  . Carotid artery narrowing 02/27/2014  . Clinical depression 02/27/2014  . Cerebrovascular accident, old 06/14/2013  . IFG  (impaired fasting glucose) 04/12/2015  . Elevated blood pressure 10/16/2015  . Split S2 (second heart sound) 10/16/2015  . Anxiety as acute reaction to exceptional stress 12/05/2015  . Muscle spasm of back 12/05/2015  . Memory changes 12/05/2015  . Osteopenia 04/30/2016  . Chronic pain of left ankle 12/16/2016  . Left foot pain 02/17/2017  . Cavus foot, acquired 04/28/2017  . Depression with anxiety 08/01/2017  . Lung cancer ([place]) 08/01/2017  . Exertional chest pain 01/26/2018  . Pulmonary embolism ([place]) 01/29/2018  . Venous stasis of lower extremity 02/25/2018  . Primary osteoarthritis of both hands 02/25/2018  . Hypoxia 04/02/2018   Resolved Ambulatory Problems    Diagnosis Date Noted  . THRUSH 09/26/2010  . Abdominal bloating 03/04/2011  .  Chronic Bronchopleural fistula    Past Medical History:  Diagnosis Date  . 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 CAD (coronary artery disease)   . Cancer ([place]) 2007  . COPD (chronic obstructive pulmonary disease) ([place]) 9/10  . Hurthle cell adenoma of thyroid 2007  . Hypertension   . Iron deficiency anemia 2/08  . Lung cancer ([place])   . Peripheral vascular disease ([place])   . RLS (restless legs syndrome)     .Marland Kitchen Family History  Problem Relation Age of Onset  . Cancer Mother        Larynx & Endometrial cancer  . Cancer Father        laryngeal cancer  . Hypertension Father   . Diabetes Sister   . Multiple sclerosis Sister   . Cancer Sister 51       breast        Assessment & Plan:  Marland KitchenMarland KitchenDiagnoses and all orders for this visit:  Chronic obstructive pulmonary disease with acute exacerbation (HCC) -     predniSONE (DELTASONE) 50 MG tablet; Take one tablet for 5 days.  HYPERCHOLESTEROLEMIA -     Lipid Panel w/reflex Direct LDL  Word finding difficulty -     vortioxetine HBr (TRINTELLIX) 10 MG TABS tablet; Take 1 tablet (10 mg total) by mouth daily.  Memory changes -     vortioxetine HBr (TRINTELLIX) 10 MG TABS tablet; Take 1  tablet (10 mg total) by mouth daily.  Mild episode of recurrent major depressive disorder (HCC) -     vortioxetine HBr (TRINTELLIX) 10 MG TABS tablet; Take 1 tablet (10 mg total) by mouth daily.  Hypoxia  Prednisone 5 days for current exacerbation. Patient instructions given to return with worsening cough, fever, or SHOB.  Walk test performed with oxygen saturation decreasing to 88% with exertion. At home oxygen needed for symptom improvement at home and during exacerbations. Patient will follow up with pulmonology and discuss the possibility of pulmonary rehab. Instructed patient to complete some deep breathing exercises and to work on improving conditioning in the mean time.  Increased trintellix she is tolerated well hopefully increase dose will help her more. Follow up in 3 months.   Marland Kitchen.Spent 30 minutes with patient and greater than 50 percent of visit spent counseling patient regarding treatment plan.

## 2018-04-01 ENCOUNTER — Encounter: Payer: Self-pay | Admitting: Physician Assistant

## 2018-04-01 ENCOUNTER — Telehealth: Payer: Self-pay | Admitting: Physician Assistant

## 2018-04-01 NOTE — Telephone Encounter (Signed)
Need to fill out paperwork for O2 to send to [employer].

## 2018-04-02 DIAGNOSIS — J9610 Chronic respiratory failure, unspecified whether with hypoxia or hypercapnia: Secondary | ICD-10-CM | POA: Insufficient documentation

## 2018-04-02 DIAGNOSIS — J9611 Chronic respiratory failure with hypoxia: Secondary | ICD-10-CM | POA: Insufficient documentation

## 2018-04-02 MED ORDER — AMBULATORY NON FORMULARY MEDICATION: 0 refills | Status: DC

## 2018-04-02 NOTE — Addendum Note (Signed)
Addended by: Donella Stade on: 04/02/2018 04:29 PM   Modules accepted: Orders

## 2018-04-06 ENCOUNTER — Other Ambulatory Visit: Payer: Self-pay | Admitting: Physician Assistant

## 2018-04-06 MED ORDER — AMBULATORY NON FORMULARY MEDICATION: 0 refills | Status: DC

## 2018-04-07 ENCOUNTER — Other Ambulatory Visit: Payer: Self-pay | Admitting: Physician Assistant

## 2018-04-07 DIAGNOSIS — F418 Other specified anxiety disorders: Secondary | ICD-10-CM

## 2018-04-07 DIAGNOSIS — I10 Essential (primary) hypertension: Secondary | ICD-10-CM

## 2018-04-07 DIAGNOSIS — I2699 Other pulmonary embolism without acute cor pulmonale: Secondary | ICD-10-CM

## 2018-04-07 MED ORDER — QUETIAPINE FUMARATE 100 MG PO TABS: 100.0000 mg | ORAL_TABLET | Freq: Every day | ORAL | 1 refills | Status: DC

## 2018-04-07 MED ORDER — ALPRAZOLAM 1 MG PO TABS: 1.0000 mg | ORAL_TABLET | Freq: Every day | ORAL | 1 refills | Status: DC | PRN

## 2018-04-07 MED ORDER — ATORVASTATIN CALCIUM 20 MG PO TABS: 20.0000 mg | ORAL_TABLET | Freq: Every day | ORAL | 4 refills | Status: DC

## 2018-04-07 MED ORDER — APIXABAN 5 MG PO TABS: 5.0000 mg | ORAL_TABLET | Freq: Two times a day (BID) | ORAL | 1 refills | Status: DC

## 2018-04-07 MED ORDER — VALSARTAN 320 MG PO TABS: 320.0000 mg | ORAL_TABLET | Freq: Every day | ORAL | 1 refills | Status: DC

## 2018-04-12 NOTE — Telephone Encounter (Signed)
Patient called today wondering about her Oxygen order. Jade, do you know any updates on this? Pt states she has not heard anything from company

## 2018-04-12 NOTE — Telephone Encounter (Signed)
They came and picked up order. Can we call the company adult and pediatric specialist to see what is going on, please?

## 2018-04-13 ENCOUNTER — Ambulatory Visit (INDEPENDENT_AMBULATORY_CARE_PROVIDER_SITE_OTHER): Payer: Medicare Other | Admitting: Emergency Medicine

## 2018-04-13 ENCOUNTER — Encounter: Payer: Self-pay | Admitting: Emergency Medicine

## 2018-04-13 DIAGNOSIS — I2699 Other pulmonary embolism without acute cor pulmonale: Secondary | ICD-10-CM

## 2018-04-13 DIAGNOSIS — C3412 Malignant neoplasm of upper lobe, left bronchus or lung: Secondary | ICD-10-CM | POA: Diagnosis not present

## 2018-04-13 DIAGNOSIS — J449 Chronic obstructive pulmonary disease, unspecified: Secondary | ICD-10-CM | POA: Diagnosis not present

## 2018-04-13 DIAGNOSIS — J9611 Chronic respiratory failure with hypoxia: Secondary | ICD-10-CM | POA: Diagnosis not present

## 2018-04-13 MED ORDER — FLUTICASONE-UMECLIDIN-VILANT 100-62.5-25 MCG/INH IN AEPB: 1.0000 | INHALATION_SPRAY | Freq: Every day | RESPIRATORY_TRACT | 1 refills | Status: DC

## 2018-04-13 MED ORDER — ARFORMOTEROL TARTRATE 15 MCG/2ML IN NEBU: 15.0000 ug | INHALATION_SOLUTION | Freq: Two times a day (BID) | RESPIRATORY_TRACT | 1 refills | Status: DC

## 2018-04-13 NOTE — Assessment & Plan Note (Signed)
Hypoxemia apparently documented on ambulation and her primary care physician.  The may be arranging for supplemental oxygen, she is not sure.  We will walk her today, plan to arrange for her oxygen if it is not already done.  Arrange for portable oxygen concentrator.

## 2018-04-13 NOTE — Assessment & Plan Note (Signed)
Continue Trelegy 1 inhalation once daily.  Remember to rinse and gargle after using this medication. Continue Brovana twice a day for now.  It may be possible to get off this medication as we go forward. Use your albuterol either your inhaler 2 puffs or 1 nebulizer treatment up to every 4 hours if needed for shortness of breath, wheezing, chest tightness.Marland Kitchen

## 2018-04-13 NOTE — Telephone Encounter (Signed)
Called [employer] and spoke to [place], who picked up the script. She is checking into what is going on and will call me back.   Kieth Brightly is the new rep for [place], and her cell is [phone]

## 2018-04-13 NOTE — Patient Instructions (Signed)
Continue Trelegy 1 inhalation once daily.  Remember to rinse and gargle after using this medication. Continue Brovana twice a day for now.  It may be possible to get off this medication as we go forward. Use your albuterol either your inhaler 2 puffs or 1 nebulizer treatment up to every 4 hours if needed for shortness of breath, wheezing, chest tightness. Continue Eliquis 5 mg twice a day.  We will continue this medication for at least 6 to 12 months after your pulmonary embolism was diagnosed (in April 2019) Walking oximetry today on room air agree that if your oxygen levels go low you will need to have oxygen available at home.  If this is not adequately arranged already then we will help set it up. Follow with Dr Lamonte Sakai in 3 months or sooner if you have any problems.

## 2018-04-13 NOTE — Assessment & Plan Note (Signed)
Wedge resection 2007.  Currently on observation

## 2018-04-13 NOTE — Assessment & Plan Note (Signed)
New diagnosis April 2019.  She will need to be on anticoagulation at least 6 months probably 6 to 12 months.  We will review at her next visit.

## 2018-04-13 NOTE — Telephone Encounter (Signed)
Kieth Brightly called back, pt is getting oxygen delivered today. I confirmed with Kieth Brightly it will be portable tanks, as pt requested. I have called pt and updated her. No further needs at this time

## 2018-04-13 NOTE — Progress Notes (Signed)
Subjective:    Patient ID: [name], female    DOB: [birth year], 72 y.o.   MRN: [id number]  HPI 72 year old former smoker (45 pack years) with a history of COPD (FEV1 0.69 L in 2010), non-small cell lung cancer with a left upper lobe wedge resection and seed implantation in 2007 by Dr. Arlyce Dice, comp by BPF that required a 2nd sgy.  She is been followed in our office by Dr. Elsworth Soho.  Unfortunately she experienced acute dyspnea and underwent a CT-PA 01/27/2018 that confirmed right sided pulmonary emboli. She is on Eliquis.   She also has HTN, hypothyroidism  She is on Trelegy, uses albuterol frequently. Also on brovana bid. She has exertional SOB with almost any activity. Minimal cough. She has some wheeze. She tells me that she desaturated at her last PCP visit and that they are "arranging o2" possibly as soon as today.    Review of Systems  Past Medical History:  Diagnosis Date  . 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 CAD (coronary artery disease)    20% stenosis on cath - no intervention required (W-S cards), negative nuclear stress test 11/07  . Cancer [facility]) 2007   lung (Dr. Earlie Server and Dr. Arlyce Dice)  . COPD (chronic obstructive pulmonary disease) ([place]) 9/10   golds stage III, FeV1 39%  . Hurthle cell adenoma of thyroid 2007  . Hypertension   . Iron deficiency anemia 2/08   s/p 2 unit transfusion  . Lung cancer ([place])   . Peripheral vascular disease ([place])   . RLS (restless legs syndrome)      Family History  Problem Relation Age of Onset  . Cancer Mother        Larynx & Endometrial cancer  . Cancer Father        laryngeal cancer  . Hypertension Father   . Diabetes Sister   . Multiple sclerosis Sister   . Cancer Sister 35       breast     Social History   Socioeconomic History  . Marital status: Married    Spouse name: Not on file  . Number of children: Not on file  . Years of education: Not on file  . Highest education level: Not on file  Occupational History  . Not  on file  Social Needs  . Financial resource strain: Not on file  . Food insecurity:    Worry: Not on file    Inability: Not on file  . Transportation needs:    Medical: Not on file    Non-medical: Not on file  Tobacco Use  . Smoking status: Former Smoker    Packs/day: 1.50    Years: 30.00    Pack years: 45.00    Types: Cigarettes    Last attempt to quit: 10/20/2004    Years since quitting: 13.4  . Smokeless tobacco: Never Used  Substance and Sexual Activity  . Alcohol use: No  . Drug use: No  . Sexual activity: Not on file  Lifestyle  . Physical activity:    Days per week: Not on file    Minutes per session: Not on file  . Stress: Not on file  Relationships  . Social connections:    Talks on phone: Not on file    Gets together: Not on file    Attends religious service: Not on file    Active member of club or organization: Not on file    Attends meetings of clubs or organizations: Not on file  Relationship status: Not on file  . Intimate partner violence:    Fear of current or ex partner: Not on file    Emotionally abused: Not on file    Physically abused: Not on file    Forced sexual activity: Not on file  Other Topics Concern  . Not on file  Social History Narrative  . Not on file     Allergies  Allergen Reactions  . Desvenlafaxine Succinate Er Other (See Comments)    shaking  . Lisinopril-Hydrochlorothiazide     REACTION: N/V cough  . Nitrofurantoin Monohyd Macro   . Penicillins     REACTION: Hives  . Zoloft [Sertraline Hcl] Other (See Comments)    "too many side effects"     Outpatient Medications Prior to Visit  Medication Sig Dispense Refill  . albuterol (PROVENTIL HFA;VENTOLIN HFA) 108 (90 Base) MCG/ACT inhaler Inhale 2 puffs into the lungs every 6 (six) hours as needed for wheezing or shortness of breath. 3 Inhaler 3  . albuterol (PROVENTIL) (2.5 MG/3ML) 0.083% nebulizer solution Take 3 mLs (2.5 mg total) by nebulization every 6 (six) hours as  needed. 1080 mL 3  . ALPRAZolam (XANAX) 1 MG tablet Take 1 tablet (1 mg total) by mouth daily as needed for anxiety. 90 tablet 1  . AMBULATORY NON FORMULARY MEDICATION Hypoxia with positive walk test. O2 2L as needed for SOB and decreased O2 stats with exertion. Pt request portable O2 tanks only. 1 Device 0  . apixaban (ELIQUIS) 5 MG TABS tablet Take 1 tablet (5 mg total) by mouth 2 (two) times daily. 180 tablet 1  . arformoterol (BROVANA) 15 MCG/2ML NEBU Take 2 mLs (15 mcg total) by nebulization 2 (two) times daily. 360 mL 4  . aspirin EC 81 MG tablet Take 81 mg by mouth daily.    Marland Kitchen atorvastatin (LIPITOR) 20 MG tablet Take 1 tablet (20 mg total) by mouth at bedtime. take 1 tablet by mouth once daily 90 tablet 4  . Fluticasone-Umeclidin-Vilant (TRELEGY ELLIPTA) 100-62.5-25 MCG/INH AEPB Inhale 1 Dose into the lungs daily. 84 each 4  . levothyroxine (SYNTHROID, LEVOTHROID) 50 MCG tablet take 1 tablet by mouth once daily 90 tablet 1  . QUEtiapine (SEROQUEL) 100 MG tablet Take 1 tablet (100 mg total) by mouth at bedtime. 90 tablet 1  . valsartan (DIOVAN) 320 MG tablet Take 1 tablet (320 mg total) by mouth daily. 90 tablet 1  . vortioxetine HBr (TRINTELLIX) 10 MG TABS tablet Take 1 tablet (10 mg total) by mouth daily. 90 tablet 1  . vortioxetine HBr (TRINTELLIX) 5 MG TABS tablet Take 1 tablet (5 mg total) by mouth daily. 90 tablet 1  . predniSONE (DELTASONE) 50 MG tablet Take one tablet for 5 days. 5 tablet 0   No facility-administered medications prior to visit.         Objective:   Physical Exam Vitals:   04/13/18 1425  BP: 138/78  Pulse: 97  SpO2: 91%  Weight: 141 lb 3.2 oz (64 kg)  Height: 5\' 3"  (1.6 m)   Gen: Pleasant, well-nourished, in no distress,  normal affect  ENT: No lesions,  mouth clear,  oropharynx clear, no postnasal drip  Neck: No JVD, no stridor  Lungs: No use of accessory muscles, distant, no wheeze  Cardiovascular: RRR, heart sounds normal, no murmur or gallops,  no peripheral edema  Musculoskeletal: No deformities, no cyanosis or clubbing  Neuro: alert, non focal  Skin: Warm, no lesions or rash  Assessment & Plan:  COPD with chronic bronchitis Continue Trelegy 1 inhalation once daily.  Remember to rinse and gargle after using this medication. Continue Brovana twice a day for now.  It may be possible to get off this medication as we go forward. Use your albuterol either your inhaler 2 puffs or 1 nebulizer treatment up to every 4 hours if needed for shortness of breath, wheezing, chest tightness..  Pulmonary embolism [facility]) New diagnosis April 2019.  She will need to be on anticoagulation at least 6 months probably 6 to 12 months.  We will review at her next visit.  Lung cancer ([place]) Wedge resection 2007.  Currently on observation  Chronic respiratory failure ([place]) Hypoxemia apparently documented on ambulation and her primary care physician.  The may be arranging for supplemental oxygen, she is not sure.  We will walk her today, plan to arrange for her oxygen if it is not already done.  Arrange for portable oxygen concentrator.    Baltazar Apo, MD, PhD 04/13/2018, 3:09 PM [facility] [phone] or if no answer [phone]

## 2018-04-14 ENCOUNTER — Ambulatory Visit (INDEPENDENT_AMBULATORY_CARE_PROVIDER_SITE_OTHER): Payer: Medicare Other | Admitting: Physician Assistant

## 2018-04-14 VITALS — BP 118/47 | HR 87

## 2018-04-14 DIAGNOSIS — I10 Essential (primary) hypertension: Secondary | ICD-10-CM | POA: Diagnosis not present

## 2018-04-14 NOTE — Telephone Encounter (Signed)
Done

## 2018-04-14 NOTE — Progress Notes (Signed)
Pt came into clinic today for BP check. At last OV her BP was elevated. Pt's BP today was at goal, diastolic was a little low. Denies any headache, dizziness, etc. Went over values with PCP, advised Pt to increase her water intake. Verbalized understanding.  Pt to get home oxygen delivered today. She is due for repeat mammogram in July, she will call when ready for order to be placed.

## 2018-04-23 ENCOUNTER — Telehealth: Payer: Self-pay | Admitting: Emergency Medicine

## 2018-04-23 DIAGNOSIS — J449 Chronic obstructive pulmonary disease, unspecified: Secondary | ICD-10-CM

## 2018-04-23 MED ORDER — FLUTICASONE-UMECLIDIN-VILANT 100-62.5-25 MCG/INH IN AEPB
1.0000 | INHALATION_SPRAY | Freq: Every day | RESPIRATORY_TRACT | 1 refills | Status: DC
Start: 2018-04-23 — End: 2018-04-23

## 2018-04-23 MED ORDER — ARFORMOTEROL TARTRATE 15 MCG/2ML IN NEBU: 15.0000 ug | INHALATION_SOLUTION | Freq: Two times a day (BID) | RESPIRATORY_TRACT | 1 refills | Status: DC

## 2018-04-23 NOTE — Telephone Encounter (Signed)
Called pt to verify which Rx she was needing a refill of and pt stated she was needing a refill of brovana, not trelegy which was specified in the refill request.  Sent refill of brovana to pt's preferred pharmacy.  Nothing further needed.

## 2018-06-18 ENCOUNTER — Ambulatory Visit (INDEPENDENT_AMBULATORY_CARE_PROVIDER_SITE_OTHER): Payer: Medicare Other | Admitting: Physician Assistant

## 2018-06-18 ENCOUNTER — Encounter: Payer: Self-pay | Admitting: Physician Assistant

## 2018-06-18 VITALS — BP 187/63 | HR 67 | Ht 63.0 in | Wt 135.0 lb

## 2018-06-18 DIAGNOSIS — I10 Essential (primary) hypertension: Secondary | ICD-10-CM | POA: Diagnosis not present

## 2018-06-18 DIAGNOSIS — I2699 Other pulmonary embolism without acute cor pulmonale: Secondary | ICD-10-CM | POA: Diagnosis not present

## 2018-06-18 DIAGNOSIS — N6489 Other specified disorders of breast: Secondary | ICD-10-CM

## 2018-06-18 DIAGNOSIS — E890 Postprocedural hypothyroidism: Secondary | ICD-10-CM

## 2018-06-18 DIAGNOSIS — J9611 Chronic respiratory failure with hypoxia: Secondary | ICD-10-CM

## 2018-06-18 DIAGNOSIS — Z23 Encounter for immunization: Secondary | ICD-10-CM | POA: Diagnosis not present

## 2018-06-18 MED ORDER — LEVOTHYROXINE SODIUM 50 MCG PO TABS
ORAL_TABLET | ORAL | 1 refills | Status: DC
Start: 2018-06-18 — End: 2019-01-13

## 2018-06-18 NOTE — Progress Notes (Signed)
Subjective:    Patient ID: [name], female    DOB: April 12, 1946, 72 y.o.   MRN: [id number]  HPI Pt is a 72 yo female with COPD, PE, HTN, hypothyroidism who present to the clinic for follow up.   Overall patient is doing well. She feels much better and excited to feel like she can go places and do things.  On inhalers and using as directed. Using O2 with ambulation. Feeling a lot better. Continues on elliqus. No CP, palpitations, headaches or vision changes. SOB improving.   No problems with thyroid medication.   .. Active Ambulatory Problems    Diagnosis Date Noted  . History of lung cancer 07/28/2006  . HYPOTHYROIDISM, POSTSURGICAL 05/19/2007  . HYPERCHOLESTEROLEMIA 07/28/2006  . ANEMIA, IRON DEFICIENCY NOS 01/04/2007  . Major depressive disorder, recurrent episode ([place]) 07/28/2006  . Anxiety state 07/28/2006  . HYPERTENSION, BENIGN SYSTEMIC 07/28/2006  . DISEASE, ISCHEMIC HEART, CHRONIC NOS 05/19/2007  . PERIPHERAL VASCULAR DISEASE, UNSPEC. 07/28/2006  . COPD with chronic bronchitis 07/28/2006  . Hypoxemia 06/22/2013  . History of hip fracture 09/02/2013  . Insomnia 09/04/2013  . GERD (gastroesophageal reflux disease) 09/04/2013  . Sensorineural hearing loss of both ears 11/18/2013  . Systolic murmur 61/44/3154  . Episode of abnormal behavior 11/28/2013  . Word finding difficulty 11/28/2013  . Memory loss 11/28/2013  . Transient cerebral ischemic attack 03/01/2014  . Carotid artery narrowing 02/27/2014  . Clinical depression 02/27/2014  . Cerebrovascular accident, old 06/14/2013  . IFG (impaired fasting glucose) 04/12/2015  . Elevated blood pressure 10/16/2015  . Split S2 (second heart sound) 10/16/2015  . Anxiety as acute reaction to exceptional stress 12/05/2015  . Muscle spasm of back 12/05/2015  . Memory changes 12/05/2015  . Osteopenia 04/30/2016  . Chronic pain of left ankle 12/16/2016  . Left foot pain 02/17/2017  . Cavus foot, acquired 04/28/2017  .  Depression with anxiety 08/01/2017  . Lung cancer ([place]) 08/01/2017  . Exertional chest pain 01/26/2018  . Pulmonary embolism ([place]) 01/29/2018  . Venous stasis of lower extremity 02/25/2018  . Primary osteoarthritis of both hands 02/25/2018  . Chronic respiratory failure ([place]) 04/02/2018  . Breast asymmetry 06/21/2018   Resolved Ambulatory Problems    Diagnosis Date Noted  . THRUSH 09/26/2010  . Abdominal bloating 03/04/2011  .  Chronic Bronchopleural fistula    Past Medical History:  Diagnosis Date  . CAD (coronary artery disease)   . Cancer ([place]) 2007  . COPD (chronic obstructive pulmonary disease) ([place]) 9/10  . Hurthle cell adenoma of thyroid 2007  . Hypertension   . Iron deficiency anemia 2/08  . Peripheral vascular disease ([place])   . RLS (restless legs syndrome)    .Marland Kitchen Family History  Problem Relation Age of Onset  . Cancer Mother        Larynx & Endometrial cancer  . Cancer Father        laryngeal cancer  . Hypertension Father   . Diabetes Sister   . Multiple sclerosis Sister   . Cancer Sister 55       breast      Review of Systems    see HPI.  Objective:   Physical Exam  Constitutional: She is oriented to person, place, and time. She appears well-developed and well-nourished.  HENT:  Head: Normocephalic and atraumatic.  Cardiovascular: Normal rate and regular rhythm.  Pulmonary/Chest: Effort normal and breath sounds normal. She has no wheezes.  A few coarse crackles heard.   Neurological: She  is alert and oriented to person, place, and time.  Psychiatric: She has a normal mood and affect. Her behavior is normal.          Assessment & Plan:   Marland KitchenMarland KitchenDiagnoses and all orders for this visit:  Chronic respiratory failure with hypoxia (HCC)  Breast asymmetry -     Cancel: MM Digital Diagnostic Bilat -     MM Digital Diagnostic Bilat  Need for vaccination for H flu type B -     Flu vaccine HIGH DOSE PF (Fluzone High dose)  Other acute pulmonary  embolism without acute cor pulmonale (HCC)  HYPERTENSION, BENIGN SYSTEMIC  HYPOTHYROIDISM, POSTSURGICAL -     levothyroxine (SYNTHROID, LEVOTHROID) 50 MCG tablet; take 1 tablet by mouth once daily   COPD- doing better on exam today. Only using O2 as needed with ambulation. Will follow up with Dr. Lamonte Sakai. High dose flu shot given today. Mammogram ordered to follow up on breast asymmetry.   Multiple BP checks and could not get any lower. Pt reports she checks at home and never that high.  Recheck nurse visit in 2 weeks. Discussed we will have to make a change to medication if staying that elevated.   Stay on anticoagulant for 6 to 12 months, defer to pulmonology for timeline.   Ordered diagnostic due to hx of breast asymmetry.   Will get labs and next visit. 2 weeks for BP recheck and 6 months for regular visit.

## 2018-06-19 DIAGNOSIS — J984 Other disorders of lung: Secondary | ICD-10-CM | POA: Diagnosis not present

## 2018-06-19 DIAGNOSIS — I10 Essential (primary) hypertension: Secondary | ICD-10-CM | POA: Diagnosis not present

## 2018-06-19 DIAGNOSIS — J449 Chronic obstructive pulmonary disease, unspecified: Secondary | ICD-10-CM | POA: Diagnosis not present

## 2018-06-19 DIAGNOSIS — E079 Disorder of thyroid, unspecified: Secondary | ICD-10-CM | POA: Diagnosis not present

## 2018-06-19 DIAGNOSIS — Z79899 Other long term (current) drug therapy: Secondary | ICD-10-CM | POA: Diagnosis not present

## 2018-06-19 DIAGNOSIS — K219 Gastro-esophageal reflux disease without esophagitis: Secondary | ICD-10-CM | POA: Diagnosis not present

## 2018-06-19 DIAGNOSIS — R9431 Abnormal electrocardiogram [ECG] [EKG]: Secondary | ICD-10-CM | POA: Diagnosis not present

## 2018-06-19 DIAGNOSIS — J929 Pleural plaque without asbestos: Secondary | ICD-10-CM | POA: Diagnosis not present

## 2018-06-19 DIAGNOSIS — Z881 Allergy status to other antibiotic agents status: Secondary | ICD-10-CM | POA: Diagnosis not present

## 2018-06-19 DIAGNOSIS — I252 Old myocardial infarction: Secondary | ICD-10-CM | POA: Diagnosis not present

## 2018-06-19 DIAGNOSIS — Z888 Allergy status to other drugs, medicaments and biological substances status: Secondary | ICD-10-CM | POA: Diagnosis not present

## 2018-06-19 DIAGNOSIS — Z8673 Personal history of transient ischemic attack (TIA), and cerebral infarction without residual deficits: Secondary | ICD-10-CM | POA: Diagnosis not present

## 2018-06-19 DIAGNOSIS — Z7901 Long term (current) use of anticoagulants: Secondary | ICD-10-CM | POA: Diagnosis not present

## 2018-06-19 DIAGNOSIS — R079 Chest pain, unspecified: Secondary | ICD-10-CM | POA: Diagnosis not present

## 2018-06-19 DIAGNOSIS — Z87891 Personal history of nicotine dependence: Secondary | ICD-10-CM | POA: Diagnosis not present

## 2018-06-19 DIAGNOSIS — Z880 Allergy status to penicillin: Secondary | ICD-10-CM | POA: Diagnosis not present

## 2018-06-21 ENCOUNTER — Encounter: Payer: Self-pay | Admitting: Physician Assistant

## 2018-06-21 DIAGNOSIS — N6489 Other specified disorders of breast: Secondary | ICD-10-CM | POA: Insufficient documentation

## 2018-06-23 ENCOUNTER — Telehealth: Payer: Self-pay

## 2018-06-23 MED ORDER — AMLODIPINE BESYLATE 5 MG PO TABS: 5.0000 mg | ORAL_TABLET | Freq: Every day | ORAL | 0 refills | Status: DC

## 2018-06-23 NOTE — Telephone Encounter (Signed)
Called and notified pt

## 2018-06-23 NOTE — Telephone Encounter (Signed)
Patient called and stated her bp has been "spiking" up and down today, and was wanting to know what can be done to help? Please advise.

## 2018-06-23 NOTE — Telephone Encounter (Signed)
Just sent norvasc 5mg  to blood pressure medication. Follow up in 1 week for BP check. Watch salt intake.

## 2018-06-24 ENCOUNTER — Telehealth: Payer: Self-pay

## 2018-06-24 NOTE — Telephone Encounter (Signed)
Patient called office today still having an elevated blood pressure. Pt was advised to only take 1 of her amlodipine 5mg  once per day along with her Diovan 320mg . Patient denies slurred speech, chest pain, and other complications. There are currently no more available appointments for today, so we put patient on the nurse schedule for a visit tomorrow morning 06/25/2018 to recheck her blood pressure.

## 2018-06-25 ENCOUNTER — Ambulatory Visit: Payer: Medicare Other

## 2018-06-30 ENCOUNTER — Other Ambulatory Visit: Payer: Self-pay | Admitting: Physician Assistant

## 2018-06-30 DIAGNOSIS — Z1231 Encounter for screening mammogram for malignant neoplasm of breast: Secondary | ICD-10-CM

## 2018-07-02 ENCOUNTER — Ambulatory Visit (INDEPENDENT_AMBULATORY_CARE_PROVIDER_SITE_OTHER): Payer: Medicare Other | Admitting: Physician Assistant

## 2018-07-02 VITALS — BP 154/51 | HR 77

## 2018-07-02 DIAGNOSIS — I10 Essential (primary) hypertension: Secondary | ICD-10-CM | POA: Diagnosis not present

## 2018-07-02 MED ORDER — HYDRALAZINE HCL 50 MG PO TABS: 50.0000 mg | ORAL_TABLET | Freq: Two times a day (BID) | ORAL | 1 refills | Status: DC

## 2018-07-02 NOTE — Progress Notes (Signed)
   Subjective:    Patient ID: [name], female    DOB: 04-15-1946, 72 y.o.   MRN: [id number]  HPI  [name] is here for blood pressure check. The hospital started her on amlodipine 5 mg. She states the amlodipine is causing her to have swelling in ankles and feet. Denies chest pain, shortness of breath or headaches.   Review of Systems     Objective:   Physical Exam        Assessment & Plan:  Hypertension - Per Luvenia Starch, Patient advised to stop the Amlodipine and start Hydralazine 50 mg twice daily. Follow up in 1 week to see [facility] for blood pressure check. Call if BP continues to elevate.   Agree with above plan. Iran Planas PA-C

## 2018-07-02 NOTE — Patient Instructions (Addendum)
Stop the Amlodipine and start Hydralazine 50 mg twice daily. Follow up in 1 week to see [facility] for blood pressure check.    Hypertension Hypertension, commonly called high blood pressure, is when the force of blood pumping through the arteries is too strong. The arteries are the blood vessels that carry blood from the heart throughout the body. Hypertension forces the heart to work harder to pump blood and may cause arteries to become narrow or stiff. Having untreated or uncontrolled hypertension can cause heart attacks, strokes, kidney disease, and other problems. A blood pressure reading consists of a higher number over a lower number. Ideally, your blood pressure should be below 120/80. The first ("top") number is called the systolic pressure. It is a measure of the pressure in your arteries as your heart beats. The second ("bottom") number is called the diastolic pressure. It is a measure of the pressure in your arteries as the heart relaxes. What are the causes? The cause of this condition is not known. What increases the risk? Some risk factors for high blood pressure are under your control. Others are not. Factors you can change  Smoking.  Having type 2 diabetes mellitus, high cholesterol, or both.  Not getting enough exercise or physical activity.  Being overweight.  Having too much fat, sugar, calories, or salt (sodium) in your diet.  Drinking too much alcohol. Factors that are difficult or impossible to change  Having chronic kidney disease.  Having a family history of high blood pressure.  Age. Risk increases with age.  Race. You may be at higher risk if you are African-American.  Gender. Men are at higher risk than women before age 7. After age 10, women are at higher risk than men.  Having obstructive sleep apnea.  Stress. What are the signs or symptoms? Extremely high blood pressure (hypertensive crisis) may cause:  Headache.  Anxiety.  Shortness of  breath.  Nosebleed.  Nausea and vomiting.  Severe chest pain.  Jerky movements you cannot control (seizures).  How is this diagnosed? This condition is diagnosed by measuring your blood pressure while you are seated, with your arm resting on a surface. The cuff of the blood pressure monitor will be placed directly against the skin of your upper arm at the level of your heart. It should be measured at least twice using the same arm. Certain conditions can cause a difference in blood pressure between your right and left arms. Certain factors can cause blood pressure readings to be lower or higher than normal (elevated) for a short period of time:  When your blood pressure is higher when you are in a health care provider's office than when you are at home, this is called white coat hypertension. Most people with this condition do not need medicines.  When your blood pressure is higher at home than when you are in a health care provider's office, this is called masked hypertension. Most people with this condition may need medicines to control blood pressure.  If you have a high blood pressure reading during one visit or you have normal blood pressure with other risk factors:  You may be asked to return on a different day to have your blood pressure checked again.  You may be asked to monitor your blood pressure at home for 1 week or longer.  If you are diagnosed with hypertension, you may have other blood or imaging tests to help your health care provider understand your overall risk for other conditions. How  is this treated? This condition is treated by making healthy lifestyle changes, such as eating healthy foods, exercising more, and reducing your alcohol intake. Your health care provider may prescribe medicine if lifestyle changes are not enough to get your blood pressure under control, and if:  Your systolic blood pressure is above 130.  Your diastolic blood pressure is above  80.  Your personal target blood pressure may vary depending on your medical conditions, your age, and other factors. Follow these instructions at home: Eating and drinking  Eat a diet that is high in fiber and potassium, and low in sodium, added sugar, and fat. An example eating plan is called the DASH (Dietary Approaches to Stop Hypertension) diet. To eat this way: ? Eat plenty of fresh fruits and vegetables. Try to fill half of your plate at each meal with fruits and vegetables. ? Eat whole grains, such as whole wheat pasta, brown rice, or whole grain bread. Fill about one quarter of your plate with whole grains. ? Eat or drink low-fat dairy products, such as skim milk or low-fat yogurt. ? Avoid fatty cuts of meat, processed or cured meats, and poultry with skin. Fill about one quarter of your plate with lean proteins, such as fish, chicken without skin, beans, eggs, and tofu. ? Avoid premade and processed foods. These tend to be higher in sodium, added sugar, and fat.  Reduce your daily sodium intake. Most people with hypertension should eat less than 1,500 mg of sodium a day.  Limit alcohol intake to no more than 1 drink a day for nonpregnant women and 2 drinks a day for men. One drink equals 12 oz of beer, 5 oz of wine, or 1 oz of hard liquor. Lifestyle  Work with your health care provider to maintain a healthy body weight or to lose weight. Ask what an ideal weight is for you.  Get at least 30 minutes of exercise that causes your heart to beat faster (aerobic exercise) most days of the week. Activities may include walking, swimming, or biking.  Include exercise to strengthen your muscles (resistance exercise), such as pilates or lifting weights, as part of your weekly exercise routine. Try to do these types of exercises for 30 minutes at least 3 days a week.  Do not use any products that contain nicotine or tobacco, such as cigarettes and e-cigarettes. If you need help quitting, ask  your health care provider.  Monitor your blood pressure at home as told by your health care provider.  Keep all follow-up visits as told by your health care provider. This is important. Medicines  Take over-the-counter and prescription medicines only as told by your health care provider. Follow directions carefully. Blood pressure medicines must be taken as prescribed.  Do not skip doses of blood pressure medicine. Doing this puts you at risk for problems and can make the medicine less effective.  Ask your health care provider about side effects or reactions to medicines that you should watch for. Contact a health care provider if:  You think you are having a reaction to a medicine you are taking.  You have headaches that keep coming back (recurring).  You feel dizzy.  You have swelling in your ankles.  You have trouble with your vision. Get help right away if:  You develop a severe headache or confusion.  You have unusual weakness or numbness.  You feel faint.  You have severe pain in your chest or abdomen.  You vomit repeatedly.  You have trouble breathing. Summary  Hypertension is when the force of blood pumping through your arteries is too strong. If this condition is not controlled, it may put you at risk for serious complications.  Your personal target blood pressure may vary depending on your medical conditions, your age, and other factors. For most people, a normal blood pressure is less than 120/80.  Hypertension is treated with lifestyle changes, medicines, or a combination of both. Lifestyle changes include weight loss, eating a healthy, low-sodium diet, exercising more, and limiting alcohol. This information is not intended to replace advice given to you by your health care provider. Make sure you discuss any questions you have with your health care provider. Document Released: 10/06/2005 Document Revised: 09/03/2016 Document Reviewed: 09/03/2016 Elsevier  Interactive Patient Education  [employer].

## 2018-07-09 ENCOUNTER — Ambulatory Visit (INDEPENDENT_AMBULATORY_CARE_PROVIDER_SITE_OTHER): Payer: Medicare Other | Admitting: Physician Assistant

## 2018-07-09 ENCOUNTER — Ambulatory Visit (INDEPENDENT_AMBULATORY_CARE_PROVIDER_SITE_OTHER): Payer: Medicare Other

## 2018-07-09 ENCOUNTER — Encounter: Payer: Self-pay | Admitting: Physician Assistant

## 2018-07-09 VITALS — BP 149/83 | HR 68 | Ht 63.0 in | Wt 138.0 lb

## 2018-07-09 DIAGNOSIS — R519 Headache, unspecified: Secondary | ICD-10-CM

## 2018-07-09 DIAGNOSIS — R51 Headache: Secondary | ICD-10-CM | POA: Diagnosis not present

## 2018-07-09 DIAGNOSIS — I10 Essential (primary) hypertension: Secondary | ICD-10-CM

## 2018-07-09 DIAGNOSIS — R42 Dizziness and giddiness: Secondary | ICD-10-CM | POA: Diagnosis not present

## 2018-07-09 MED ORDER — CLONIDINE 0.1 MG/24HR TD PTWK: 0.1000 mg | MEDICATED_PATCH | TRANSDERMAL | 0 refills | Status: DC

## 2018-07-09 MED ORDER — CLONIDINE HCL 0.1 MG PO TABS
0.1000 mg | ORAL_TABLET | Freq: Once | ORAL | Status: AC
  Administered 2018-07-09: 0.1 mg via ORAL

## 2018-07-09 NOTE — Patient Instructions (Addendum)
Stop hydralazine.  Start BP patch tonight.  Will get CT scan.   Good rx!!!

## 2018-07-09 NOTE — Progress Notes (Signed)
Subjective:    Patient ID: [name], female    DOB: 1946-09-10, 72 y.o.   MRN: [id number]  HPI  Pt is a 72 yo female with PMHx of recent PE, TIA, HTN, PVD, CAD, COPD, chronic respiratory failure who presents to discuss uncontrolled BP. She is accompanied by husband. Pt is on eliqus for PE.   Pt is concerned about her blood pressure. Hydralazine was given and taking twice a day. She feels like not helping BP and making her SOb worse. She also feels much more fatigued since starting. She has a terrible headache that is constant. She goes to sleep at night "thinking she might not wake up".   Her chronic respiratory failure is controlled with O2 with any ambulation. She sees Dr. Lamonte Sakai, pulmonologist. They question the [place] tank that is lighter and easier to carry.    .. Active Ambulatory Problems    Diagnosis Date Noted  . History of lung cancer 07/28/2006  . HYPOTHYROIDISM, POSTSURGICAL 05/19/2007  . HYPERCHOLESTEROLEMIA 07/28/2006  . ANEMIA, IRON DEFICIENCY NOS 01/04/2007  . Major depressive disorder, recurrent episode ([place]) 07/28/2006  . Anxiety state 07/28/2006  . HYPERTENSION, BENIGN SYSTEMIC 07/28/2006  . DISEASE, ISCHEMIC HEART, CHRONIC NOS 05/19/2007  . PERIPHERAL VASCULAR DISEASE, UNSPEC. 07/28/2006  . COPD with chronic bronchitis 07/28/2006  . Hypoxemia 06/22/2013  . History of hip fracture 09/02/2013  . Insomnia 09/04/2013  . GERD (gastroesophageal reflux disease) 09/04/2013  . Sensorineural hearing loss of both ears 11/18/2013  . Systolic murmur 95/28/4132  . Episode of abnormal behavior 11/28/2013  . Word finding difficulty 11/28/2013  . Memory loss 11/28/2013  . Transient cerebral ischemic attack 03/01/2014  . Carotid artery narrowing 02/27/2014  . Clinical depression 02/27/2014  . Cerebrovascular accident, old 06/14/2013  . IFG (impaired fasting glucose) 04/12/2015  . Elevated blood pressure 10/16/2015  . Split S2 (second heart sound) 10/16/2015  . Anxiety  as acute reaction to exceptional stress 12/05/2015  . Muscle spasm of back 12/05/2015  . Memory changes 12/05/2015  . Osteopenia 04/30/2016  . Chronic pain of left ankle 12/16/2016  . Left foot pain 02/17/2017  . Cavus foot, acquired 04/28/2017  . Depression with anxiety 08/01/2017  . Lung cancer ([place]) 08/01/2017  . Exertional chest pain 01/26/2018  . Pulmonary embolism ([place]) 01/29/2018  . Venous stasis of lower extremity 02/25/2018  . Primary osteoarthritis of both hands 02/25/2018  . Chronic respiratory failure ([place]) 04/02/2018  . Breast asymmetry 06/21/2018   Resolved Ambulatory Problems    Diagnosis Date Noted  . THRUSH 09/26/2010  . Abdominal bloating 03/04/2011  .  Chronic Bronchopleural fistula    Past Medical History:  Diagnosis Date  . CAD (coronary artery disease)   . Cancer ([place]) 2007  . COPD (chronic obstructive pulmonary disease) ([place]) 9/10  . Hurthle cell adenoma of thyroid 2007  . Hypertension   . Iron deficiency anemia 2/08  . Peripheral vascular disease ([place])   . RLS (restless legs syndrome)     Review of Systems  All other systems reviewed and are negative.      Objective:   Physical Exam  Constitutional: She is oriented to person, place, and time. She appears well-developed and well-nourished.  HENT:  Head: Normocephalic and atraumatic.  Eyes: Pupils are equal, round, and reactive to light. Conjunctivae and EOM are normal.  Cardiovascular: Normal rate and regular rhythm.  Pulmonary/Chest:  Decreased effort. Absent lung sounds LUQ.   Musculoskeletal:  Upper and lower extremity strength 5/5.  Neurological: She is alert and oriented to person, place, and time. She displays normal reflexes. No cranial nerve deficit or sensory deficit. She exhibits normal muscle tone. Coordination normal.  Skin:  No peripheral edema.   Psychiatric: She has a normal mood and affect. Her behavior is normal.          Assessment & Plan:  Marland KitchenMarland KitchenDiagnoses and all  orders for this visit:  Acute nonintractable headache, unspecified headache type -     cloNIDine (CATAPRES - DOSED IN MG/24 HR) 0.1 mg/24hr patch; Place 1 patch (0.1 mg total) onto the skin once a week. -     CT Head Wo Contrast -     cloNIDine (CATAPRES) tablet 0.1 mg  Uncontrolled hypertension -     cloNIDine (CATAPRES - DOSED IN MG/24 HR) 0.1 mg/24hr patch; Place 1 patch (0.1 mg total) onto the skin once a week. -     CT Head Wo Contrast -     cloNIDine (CATAPRES) tablet 0.1 mg  clonidine given in office. Pt was sent downstairs to do stat CT. When she came back up BP much better. Start clonadine patch tonight. Follow up in 1 week. Stroke is of concern with BP so high. Call office if BP not decreasing. Go to ED with BP rising or any neurological symptoms.   Call CT results: [phone]  Will see if we can get patient set up with lighter tank through [place].   Marland Kitchen.Spent 30 minutes with patient and greater than 50 percent of visit spent counseling patient regarding treatment plan.

## 2018-07-09 NOTE — Progress Notes (Signed)
GREAT news. No mass, no bleed. You do have some calcification of arteries. We could consider MRA in the future if getting BP control does not resolve headache. I don't know if I told you this but I would like to see you in the office Thursday of next week to recheck bP.

## 2018-07-11 ENCOUNTER — Encounter: Payer: Self-pay | Admitting: Physician Assistant

## 2018-07-11 ENCOUNTER — Telehealth: Payer: Self-pay | Admitting: Physician Assistant

## 2018-07-11 DIAGNOSIS — R519 Headache, unspecified: Secondary | ICD-10-CM

## 2018-07-11 DIAGNOSIS — R51 Headache: Principal | ICD-10-CM

## 2018-07-11 NOTE — Telephone Encounter (Signed)
Can we call patient's oxygen company and see if they have the iongen oxygen system? Pt needs portable tank that is lighter?

## 2018-07-11 NOTE — Telephone Encounter (Signed)
Call pt: and find out how bP doing and how she is feeling?

## 2018-07-14 ENCOUNTER — Encounter: Payer: Self-pay | Admitting: Emergency Medicine

## 2018-07-14 ENCOUNTER — Ambulatory Visit (INDEPENDENT_AMBULATORY_CARE_PROVIDER_SITE_OTHER): Payer: Medicare Other | Admitting: Emergency Medicine

## 2018-07-14 DIAGNOSIS — I2699 Other pulmonary embolism without acute cor pulmonale: Secondary | ICD-10-CM

## 2018-07-14 DIAGNOSIS — J449 Chronic obstructive pulmonary disease, unspecified: Secondary | ICD-10-CM

## 2018-07-14 MED ORDER — FLUTICASONE-UMECLIDIN-VILANT 100-62.5-25 MCG/INH IN AEPB: 1.0000 | INHALATION_SPRAY | Freq: Every day | RESPIRATORY_TRACT | 1 refills | Status: DC

## 2018-07-14 NOTE — Patient Instructions (Addendum)
Please continue Eliquis as you have been taking it for the next month.  You can stop this medicine when your last prescription runs out in October.  You should not need a refill. Stop Brovana. Continue Trelegy 1 inhalation once daily.  This will be your maintenance medicine to take every day on a schedule.  Please rinse and gargle after using. Keep your albuterol available to use either 2 puffs or 1 nebulizer treatment up to every 4 hours as needed for shortness of breath, chest tightness, wheezing. Flu shot and pneumonia shots are up to date Follow with Dr Lamonte Sakai in 6 months or sooner if you have any problems

## 2018-07-14 NOTE — Assessment & Plan Note (Signed)
She is on both Trelegy and [place].  I will stop the Brovana since it is likely redundant.  We will try to see if we maintain good functional capacity on the trilogy alone.  She has albuterol to use as needed.  Her flu and pneumonia vaccines are up-to-date.

## 2018-07-14 NOTE — Assessment & Plan Note (Signed)
She has been on therapy since April 2019.  I like to finish 6 months of therapy, complete her prescription which should get her through October.

## 2018-07-14 NOTE — Addendum Note (Signed)
Addended by: Desmond Dike C on: 07/14/2018 02:14 PM   Modules accepted: Orders

## 2018-07-14 NOTE — Progress Notes (Signed)
Subjective:    Patient ID: [name], female    DOB: [birth year], 72 y.o.   MRN: [id number]  HPI 72 year old former smoker (45 pack years) with a history of COPD (FEV1 0.69 L in 2010), non-small cell lung cancer with a left upper lobe wedge resection and seed implantation in 2007 by Dr. Arlyce Dice, comp by BPF that required a 2nd sgy.  She is been followed in our office by Dr. Elsworth Soho.  Unfortunately she experienced acute dyspnea and underwent a CT-PA 01/27/2018 that confirmed right sided pulmonary emboli. She is on Eliquis.   She also has HTN, hypothyroidism  She is on Trelegy, uses albuterol frequently. Also on brovana bid. She has exertional SOB with almost any activity. Minimal cough. She has some wheeze. She tells me that she desaturated at her last PCP visit and that they are "arranging o2" possibly as soon as today.   ROV 07/14/18 --this follow-up visit patient with a history of COPD, non-small cell lung cancer that required a left upper lobe wedge resection, pulmonary embolism that was diagnosed 01/2018.  She is been treated with Eliquis for anticoagulation. She reports that she has been feeling well. Has O2 that she uses prn w exertion. Occasional cough, no hemoptysis. She remains on brovana even though she is on Trelegy.    Review of Systems  Past Medical History:  Diagnosis Date  . 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 CAD (coronary artery disease)    20% stenosis on cath - no intervention required (W-S cards), negative nuclear stress test 11/07  . Cancer [facility]) 2007   lung (Dr. Earlie Server and Dr. Arlyce Dice)  . COPD (chronic obstructive pulmonary disease) ([place]) 9/10   golds stage III, FeV1 39%  . Hurthle cell adenoma of thyroid 2007  . Hypertension   . Iron deficiency anemia 2/08   s/p 2 unit transfusion  . Lung cancer ([place])   . Peripheral vascular disease ([place])   . RLS (restless legs syndrome)      Family History  Problem Relation Age of Onset  . Cancer Mother        Larynx &  Endometrial cancer  . Cancer Father        laryngeal cancer  . Hypertension Father   . Diabetes Sister   . Multiple sclerosis Sister   . Cancer Sister 28       breast     Social History   Socioeconomic History  . Marital status: Married    Spouse name: Not on file  . Number of children: Not on file  . Years of education: Not on file  . Highest education level: Not on file  Occupational History  . Not on file  Social Needs  . Financial resource strain: Not on file  . Food insecurity:    Worry: Not on file    Inability: Not on file  . Transportation needs:    Medical: Not on file    Non-medical: Not on file  Tobacco Use  . Smoking status: Former Smoker    Packs/day: 1.50    Years: 30.00    Pack years: 45.00    Types: Cigarettes    Last attempt to quit: 10/20/2004    Years since quitting: 13.7  . Smokeless tobacco: Never Used  Substance and Sexual Activity  . Alcohol use: No  . Drug use: No  . Sexual activity: Not on file  Lifestyle  . Physical activity:    Days per week: Not on file  Minutes per session: Not on file  . Stress: Not on file  Relationships  . Social connections:    Talks on phone: Not on file    Gets together: Not on file    Attends religious service: Not on file    Active member of club or organization: Not on file    Attends meetings of clubs or organizations: Not on file    Relationship status: Not on file  . Intimate partner violence:    Fear of current or ex partner: Not on file    Emotionally abused: Not on file    Physically abused: Not on file    Forced sexual activity: Not on file  Other Topics Concern  . Not on file  Social History Narrative  . Not on file     Allergies  Allergen Reactions  . Desvenlafaxine Succinate Er Other (See Comments)    shaking  . Lisinopril-Hydrochlorothiazide     REACTION: N/V cough  . Nitrofurantoin Monohyd Macro   . Norvasc [Amlodipine]     Lower extremity edema  . Penicillins     REACTION:  Hives  . Zoloft [Sertraline Hcl] Other (See Comments)    "too many side effects"     Outpatient Medications Prior to Visit  Medication Sig Dispense Refill  . albuterol (PROVENTIL HFA;VENTOLIN HFA) 108 (90 Base) MCG/ACT inhaler Inhale 2 puffs into the lungs every 6 (six) hours as needed for wheezing or shortness of breath. 3 Inhaler 3  . albuterol (PROVENTIL) (2.5 MG/3ML) 0.083% nebulizer solution Take 3 mLs (2.5 mg total) by nebulization every 6 (six) hours as needed. 1080 mL 3  . ALPRAZolam (XANAX) 1 MG tablet Take 1 tablet (1 mg total) by mouth daily as needed for anxiety. 90 tablet 1  . AMBULATORY NON FORMULARY MEDICATION Hypoxia with positive walk test. O2 2L as needed for SOB and decreased O2 stats with exertion. Pt request portable O2 tanks only. 1 Device 0  . apixaban (ELIQUIS) 5 MG TABS tablet Take 1 tablet (5 mg total) by mouth 2 (two) times daily. 180 tablet 1  . arformoterol (BROVANA) 15 MCG/2ML NEBU Take 2 mLs (15 mcg total) by nebulization 2 (two) times daily. 360 mL 1  . aspirin EC 81 MG tablet Take 81 mg by mouth daily.    Marland Kitchen atorvastatin (LIPITOR) 20 MG tablet Take 1 tablet (20 mg total) by mouth at bedtime. take 1 tablet by mouth once daily 90 tablet 4  . cloNIDine (CATAPRES - DOSED IN MG/24 HR) 0.1 mg/24hr patch Place 1 patch (0.1 mg total) onto the skin once a week. 4 patch 0  . Fluticasone-Umeclidin-Vilant (TRELEGY ELLIPTA) 100-62.5-25 MCG/INH AEPB Inhale 1 puff into the lungs daily.    Marland Kitchen levothyroxine (SYNTHROID, LEVOTHROID) 50 MCG tablet take 1 tablet by mouth once daily 90 tablet 1  . QUEtiapine (SEROQUEL) 100 MG tablet Take 1 tablet (100 mg total) by mouth at bedtime. 90 tablet 1  . valsartan (DIOVAN) 320 MG tablet Take 1 tablet (320 mg total) by mouth daily. 90 tablet 1  . vortioxetine HBr (TRINTELLIX) 10 MG TABS tablet Take 1 tablet (10 mg total) by mouth daily. 90 tablet 1   No facility-administered medications prior to visit.         Objective:   Physical  Exam Vitals:   07/14/18 1328  BP: (!) 146/86  Pulse: 85  SpO2: 92%  Weight: 137 lb (62.1 kg)  Height: 5\' 3"  (1.6 m)   Gen: Pleasant, well-nourished, in no  distress,  normal affect  ENT: No lesions,  mouth clear,  oropharynx clear, no postnasal drip  Neck: No JVD, no stridor  Lungs: No use of accessory muscles, distant, no wheeze  Cardiovascular: RRR, heart sounds normal, no murmur or gallops, no peripheral edema  Musculoskeletal: No deformities, no cyanosis or clubbing  Neuro: alert, non focal  Skin: Warm, no lesions or rash     Assessment & Plan:  COPD with chronic bronchitis She is on both Trelegy and Brovana.  I will stop the Brovana since it is likely redundant.  We will try to see if we maintain good functional capacity on the trilogy alone.  She has albuterol to use as needed.  Her flu and pneumonia vaccines are up-to-date.  Pulmonary embolism [facility]) She has been on therapy since April 2019.  I like to finish 6 months of therapy, complete her prescription which should get her through October.    Baltazar Apo, MD, PhD 07/14/2018, 1:50 PM [facility] [phone] or if no answer [phone]

## 2018-07-14 NOTE — Telephone Encounter (Signed)
[name], can you please help look into this? Thanks!

## 2018-07-14 NOTE — Telephone Encounter (Signed)
Left message for patient to call back about her oxygen therapy. Patient has a follow up in office tomorrow and she PCP will address BP.

## 2018-07-15 ENCOUNTER — Ambulatory Visit (INDEPENDENT_AMBULATORY_CARE_PROVIDER_SITE_OTHER): Payer: Medicare Other | Admitting: Physician Assistant

## 2018-07-15 ENCOUNTER — Encounter: Payer: Self-pay | Admitting: Physician Assistant

## 2018-07-15 VITALS — BP 150/63 | HR 72 | Ht 63.0 in | Wt 136.0 lb

## 2018-07-15 DIAGNOSIS — G4452 New daily persistent headache (NDPH): Secondary | ICD-10-CM

## 2018-07-15 DIAGNOSIS — H81399 Other peripheral vertigo, unspecified ear: Secondary | ICD-10-CM

## 2018-07-15 DIAGNOSIS — H538 Other visual disturbances: Secondary | ICD-10-CM | POA: Diagnosis not present

## 2018-07-15 DIAGNOSIS — R928 Other abnormal and inconclusive findings on diagnostic imaging of breast: Secondary | ICD-10-CM

## 2018-07-15 NOTE — Patient Instructions (Addendum)
[place] Will get MRA use xanax 30 minutes before procedure. BRING driver.  Stay on same dose.  Follow up in 2 weeks.

## 2018-07-15 NOTE — Progress Notes (Signed)
Subjective:    Patient ID: [name], female    DOB: 16-Sep-1946, 72 y.o.   MRN: [id number]  HPI  Pt is a 72 yo female with PMHx of recent PE, TIA, HTN, PVD, CAD, COPD, and chronic respiratory failure who presents to the clinic to follow up on uncontrolled HTN with headaches.   She was seen one week ago and started on clonadine patch. CT was obtained due to headache and on eliquis. No acute findings on CT. Pt is very concerned about aneurysm.   BP log BP trending down. 138/62, 176/77, 147/66, 153/62, 118/72, 160/75.   She does continue to have frontal headache off and on. At times it is almost unbearable. She has intermittent dizziness and blurred vision as well.   She does admit that there has been no change in breathing with hydralazine as previously reported.   Pt recently saw pulmonology. She will be able to come off eliquis when finish the next rx.   Pt needs order for mammogram. She never had follow up after abnormal findings.   .. Active Ambulatory Problems    Diagnosis Date Noted  . History of lung cancer 07/28/2006  . HYPOTHYROIDISM, POSTSURGICAL 05/19/2007  . HYPERCHOLESTEROLEMIA 07/28/2006  . ANEMIA, IRON DEFICIENCY NOS 01/04/2007  . Major depressive disorder, recurrent episode ([place]) 07/28/2006  . Anxiety state 07/28/2006  . HYPERTENSION, BENIGN SYSTEMIC 07/28/2006  . DISEASE, ISCHEMIC HEART, CHRONIC NOS 05/19/2007  . PERIPHERAL VASCULAR DISEASE, UNSPEC. 07/28/2006  . COPD with chronic bronchitis 07/28/2006  . Hypoxemia 06/22/2013  . History of hip fracture 09/02/2013  . Insomnia 09/04/2013  . GERD (gastroesophageal reflux disease) 09/04/2013  . Sensorineural hearing loss of both ears 11/18/2013  . Systolic murmur 35/70/1779  . Episode of abnormal behavior 11/28/2013  . Word finding difficulty 11/28/2013  . Memory loss 11/28/2013  . Transient cerebral ischemic attack 03/01/2014  . Carotid artery narrowing 02/27/2014  . Clinical depression 02/27/2014  .  Cerebrovascular accident, old 06/14/2013  . IFG (impaired fasting glucose) 04/12/2015  . Elevated blood pressure 10/16/2015  . Split S2 (second heart sound) 10/16/2015  . Anxiety as acute reaction to exceptional stress 12/05/2015  . Muscle spasm of back 12/05/2015  . Memory changes 12/05/2015  . Osteopenia 04/30/2016  . Chronic pain of left ankle 12/16/2016  . Left foot pain 02/17/2017  . Cavus foot, acquired 04/28/2017  . Depression with anxiety 08/01/2017  . Lung cancer ([place]) 08/01/2017  . Exertional chest pain 01/26/2018  . Pulmonary embolism ([place]) 01/29/2018  . Venous stasis of lower extremity 02/25/2018  . Primary osteoarthritis of both hands 02/25/2018  . Chronic respiratory failure ([place]) 04/02/2018  . Breast asymmetry 06/21/2018   Resolved Ambulatory Problems    Diagnosis Date Noted  . THRUSH 09/26/2010  . Abdominal bloating 03/04/2011  .  Chronic Bronchopleural fistula    Past Medical History:  Diagnosis Date  . CAD (coronary artery disease)   . Cancer ([place]) 2007  . COPD (chronic obstructive pulmonary disease) ([place]) 9/10  . Hurthle cell adenoma of thyroid 2007  . Hypertension   . Iron deficiency anemia 2/08  . Peripheral vascular disease ([place])   . RLS (restless legs syndrome)       Review of Systems See HPI.     Objective:   Physical Exam  Constitutional: She is oriented to person, place, and time. She appears well-developed and well-nourished.  HENT:  Head: Normocephalic and atraumatic.  Cardiovascular: Normal rate and regular rhythm.  Pulmonary/Chest: Effort normal and breath  sounds normal. She has no wheezes.  Absent LUQ breath sounds.   Neurological: She is alert and oriented to person, place, and time.  Psychiatric: She has a normal mood and affect. Her behavior is normal.          Assessment & Plan:  Marland KitchenMarland KitchenDiagnoses and all orders for this visit:  New daily persistent headache -     MR Angiogram Head Wo Contrast  Abnormal mammogram -     MM  DIAG BREAST TOMO BILATERAL  Vision blurring  Other peripheral vertigo, unspecified ear -     MR Angiogram Head Wo Contrast   Will get MRA to evaluate headache/vision changes/dizziness and to rule out aneurysm.   BP seems to be getting better. Recheck in 2 weeks. Stay on clonidine patch. Consider increase to .2mg  patch if not under 150/90 on average. Continue to keep log.

## 2018-07-16 NOTE — Telephone Encounter (Signed)
Reminder call oxygen company and see if they can get or if insurance will approve iongen oxygen tanks. Pt would like to have.

## 2018-07-16 NOTE — Telephone Encounter (Signed)
Noted. With medicare no prior auth is required. If it goes through her primary and secondary insurance and is not covered, we will complete an appeal.

## 2018-07-16 NOTE — Telephone Encounter (Signed)
I ordered MRA to evaluate for aneurysm. She is have persistent headache, vision blurriness, uncontrolled HTN, dizziness. It showed may not be approved wanted to give heads up.

## 2018-07-17 NOTE — Telephone Encounter (Signed)
Please keep patient in the loop. Also MRA is preferred because looking for aneurysm but if will approve any MRI is better than nothing.

## 2018-07-19 NOTE — Telephone Encounter (Signed)
[facility] refused to schedule MRI due to diagnosis code. PCP advised, will refer to neurology.  Left 2nd VM for Pt.

## 2018-07-19 NOTE — Telephone Encounter (Signed)
Left VM for Pt to return clinic call.  

## 2018-07-26 DIAGNOSIS — G4452 New daily persistent headache (NDPH): Secondary | ICD-10-CM | POA: Diagnosis not present

## 2018-07-26 DIAGNOSIS — H81399 Other peripheral vertigo, unspecified ear: Secondary | ICD-10-CM | POA: Diagnosis not present

## 2018-07-26 DIAGNOSIS — R51 Headache: Secondary | ICD-10-CM | POA: Diagnosis not present

## 2018-07-28 ENCOUNTER — Encounter: Payer: Self-pay | Admitting: Physician Assistant

## 2018-07-28 ENCOUNTER — Ambulatory Visit (INDEPENDENT_AMBULATORY_CARE_PROVIDER_SITE_OTHER): Payer: Medicare Other | Admitting: Physician Assistant

## 2018-07-28 VITALS — BP 170/65 | HR 78 | Ht 63.0 in | Wt 137.0 lb

## 2018-07-28 DIAGNOSIS — I10 Essential (primary) hypertension: Secondary | ICD-10-CM | POA: Diagnosis not present

## 2018-07-28 DIAGNOSIS — F330 Major depressive disorder, recurrent, mild: Secondary | ICD-10-CM | POA: Diagnosis not present

## 2018-07-28 DIAGNOSIS — G4452 New daily persistent headache (NDPH): Secondary | ICD-10-CM

## 2018-07-28 DIAGNOSIS — F411 Generalized anxiety disorder: Secondary | ICD-10-CM

## 2018-07-28 DIAGNOSIS — F430 Acute stress reaction: Secondary | ICD-10-CM | POA: Diagnosis not present

## 2018-07-28 MED ORDER — CLONIDINE 0.2 MG/24HR TD PTWK: 0.2000 mg | MEDICATED_PATCH | TRANSDERMAL | 12 refills | Status: DC

## 2018-07-29 NOTE — Progress Notes (Signed)
Call pt: labs overall look good. Normal thyroid.

## 2018-07-29 NOTE — Progress Notes (Signed)
Is she talking about iongen tanks. If so I thought I already asked some to call her O2 company and see what coverage is on this. See if she would qualify. If she does I am more than happy to write and rx out for her.

## 2018-07-29 NOTE — Progress Notes (Signed)
After looking into BP patch I do think you were placing it on the wrong side. Did you find out from pharmacy?

## 2018-07-30 ENCOUNTER — Encounter: Payer: Self-pay | Admitting: Physician Assistant

## 2018-07-30 DIAGNOSIS — I10 Essential (primary) hypertension: Secondary | ICD-10-CM | POA: Insufficient documentation

## 2018-07-30 NOTE — Progress Notes (Signed)
Can you call and find out how to write this to get approved? [employer] there are different models etc.

## 2018-07-30 NOTE — Progress Notes (Signed)
Subjective:    Patient ID: [name], female    DOB: [birth year], 72 y.o.   MRN: [id number]  HPI Pt is a 72 yo female who presents to the clinic with her husband to follow up on uncontrolled hypertension and new daily headache. Headache seems to go away with BP improves.   .. Active Ambulatory Problems    Diagnosis Date Noted  . History of lung cancer 07/28/2006  . HYPOTHYROIDISM, POSTSURGICAL 05/19/2007  . HYPERCHOLESTEROLEMIA 07/28/2006  . ANEMIA, IRON DEFICIENCY NOS 01/04/2007  . Major depressive disorder, recurrent episode ([place]) 07/28/2006  . Anxiety state 07/28/2006  . HYPERTENSION, BENIGN SYSTEMIC 07/28/2006  . DISEASE, ISCHEMIC HEART, CHRONIC NOS 05/19/2007  . PERIPHERAL VASCULAR DISEASE, UNSPEC. 07/28/2006  . COPD with chronic bronchitis 07/28/2006  . Hypoxemia 06/22/2013  . History of hip fracture 09/02/2013  . Insomnia 09/04/2013  . GERD (gastroesophageal reflux disease) 09/04/2013  . Sensorineural hearing loss of both ears 11/18/2013  . Systolic murmur 65/68/1275  . Episode of abnormal behavior 11/28/2013  . Word finding difficulty 11/28/2013  . Memory loss 11/28/2013  . Transient cerebral ischemic attack 03/01/2014  . Carotid artery narrowing 02/27/2014  . Clinical depression 02/27/2014  . Cerebrovascular accident, old 06/14/2013  . IFG (impaired fasting glucose) 04/12/2015  . Elevated blood pressure 10/16/2015  . Split S2 (second heart sound) 10/16/2015  . Anxiety as acute reaction to exceptional stress 12/05/2015  . Muscle spasm of back 12/05/2015  . Memory changes 12/05/2015  . Osteopenia 04/30/2016  . Chronic pain of left ankle 12/16/2016  . Left foot pain 02/17/2017  . Cavus foot, acquired 04/28/2017  . Depression with anxiety 08/01/2017  . Lung cancer ([place]) 08/01/2017  . Exertional chest pain 01/26/2018  . Pulmonary embolism ([place]) 01/29/2018  . Venous stasis of lower extremity 02/25/2018  . Primary osteoarthritis of both hands 02/25/2018  .  Chronic respiratory failure ([place]) 04/02/2018  . Breast asymmetry 06/21/2018  . Headache, new daily persistent ([facility]) 07/28/2018   Resolved Ambulatory Problems    Diagnosis Date Noted  . THRUSH 09/26/2010  . Abdominal bloating 03/04/2011  .  Chronic Bronchopleural fistula    Past Medical History:  Diagnosis Date  . CAD (coronary artery disease)   . Cancer ([place]) 2007  . COPD (chronic obstructive pulmonary disease) ([place]) 9/10  . Hurthle cell adenoma of thyroid 2007  . Hypertension   . Iron deficiency anemia 2/08  . Peripheral vascular disease ([place])   . RLS (restless legs syndrome)     Had a MRI yesterday due to concern for aneurysm. Here to go over results.   She did stop trintellix thinking it wasn't helping her cognition very much. When asked about her anxiety and depression she stated "it was not good".   When checking her BP they are everywhere from 130's-200's and 80-100's. She does not have any CP just a headache. In office clonadine worked well to get BP down. She is on .1mg  patch    Review of Systems See HPI.     Objective:   Physical Exam  Constitutional: She appears well-developed and well-nourished.  HENT:  Head: Normocephalic and atraumatic.  Cardiovascular: Normal rate and regular rhythm.  Pulmonary/Chest: Effort normal and breath sounds normal.  Neurological: She is alert.  Psychiatric: She has a normal mood and affect. Her behavior is normal.          Assessment & Plan:  Marland KitchenMarland KitchenDiagnoses and all orders for this visit:  Uncontrolled stage 2 hypertension -  cloNIDine (CATAPRES - DOSED IN MG/24 HR) 0.2 mg/24hr patch; Place 1 patch (0.2 mg total) onto the skin once a week. -     TSH -     BASIC METABOLIC PANEL WITH GFR -     Catecholamines, fractionated, urine, 24 hour; Future -     Catecholamines, fractionated, plasma -     Metanephrines, plasma  Headache, new daily persistent ([facility])  Anxiety as acute reaction to exceptional stress  Mild episode of  recurrent major depressive disorder ([place])  unsure why we can't get BP controlled. Was some concern the right side of patch might not be on skin to transfer medication. She is going to discuss with pharmacist. Will check TSH and hormones to look at possibility of pheochromocytoma.   Reassuring MRI is normal. I do not want to give medication to treat headache when clearly linked to BP.   Increased clonadine to .2mg  patches.   Get back on trintellix. This will help depression and anxiety. This could be contributing to BP issues as well.   If continue to spike at home please let us know.    Nurse BP check in 1 week.

## 2018-08-01 NOTE — Progress Notes (Signed)
Call pt: normal urine sample.

## 2018-08-02 ENCOUNTER — Telehealth: Payer: Self-pay

## 2018-08-02 ENCOUNTER — Other Ambulatory Visit: Payer: Self-pay | Admitting: Physician Assistant

## 2018-08-02 DIAGNOSIS — R51 Headache: Principal | ICD-10-CM

## 2018-08-02 DIAGNOSIS — I10 Essential (primary) hypertension: Secondary | ICD-10-CM

## 2018-08-02 DIAGNOSIS — R519 Headache, unspecified: Secondary | ICD-10-CM

## 2018-08-03 ENCOUNTER — Other Ambulatory Visit: Payer: Self-pay

## 2018-08-03 DIAGNOSIS — I10 Essential (primary) hypertension: Secondary | ICD-10-CM

## 2018-08-03 MED ORDER — VALSARTAN 320 MG PO TABS: 320.0000 mg | ORAL_TABLET | Freq: Every day | ORAL | 1 refills | Status: DC

## 2018-08-03 NOTE — Progress Notes (Signed)
Ok to send her blood pressure medication. Which ever one she needs.  Yes find out exactly what we need to do for approval or portable O2.

## 2018-08-04 LAB — BASIC METABOLIC PANEL WITH GFR
BUN/Creatinine Ratio: 22 (calc) (ref 6–22)
BUN: 12 mg/dL (ref 7–25)
CO2: 34 mmol/L — ABNORMAL HIGH (ref 20–32)
Calcium: 9.5 mg/dL (ref 8.6–10.4)
Chloride: 95 mmol/L — ABNORMAL LOW (ref 98–110)
Creat: 0.54 mg/dL — ABNORMAL LOW (ref 0.60–0.93)
GFR, Est African American: 110 mL/min/{1.73_m2} (ref >=60)
GFR, Est Non African American: 95 mL/min/{1.73_m2} (ref >=60)
Glucose, Bld: 98 mg/dL (ref 65–99)
Potassium: 3.7 mmol/L (ref 3.5–5.3)
Sodium: 136 mmol/L (ref 135–146)

## 2018-08-04 LAB — CATECHOLAMINES, FRACTIONATED, PLASMA
Catecholamines, Total: 618 pg/mL
Epinephrine: 64 pg/mL
Norepinephrine: 554 pg/mL

## 2018-08-04 LAB — TSH: TSH: 2.56 mIU/L (ref 0.40–4.50)

## 2018-08-04 LAB — METANEPHRINES, PLASMA
Metanephrine, Free: 57 pg/mL (ref <=57)
Normetanephrine, Free: 85 pg/mL (ref <=148)
Total Metanephrines-Plasma: 142 pg/mL (ref <=205)

## 2018-08-04 NOTE — Progress Notes (Signed)
Thanks,

## 2018-08-09 NOTE — Progress Notes (Signed)
Can we call patient and follow up on her BP readings at home?   Also let her know paperwork has been signed and faxed for portable O2.

## 2018-08-09 NOTE — Progress Notes (Signed)
Please check and send log of readings.

## 2018-08-11 ENCOUNTER — Other Ambulatory Visit: Payer: Self-pay | Admitting: Physician Assistant

## 2018-08-11 ENCOUNTER — Ambulatory Visit (INDEPENDENT_AMBULATORY_CARE_PROVIDER_SITE_OTHER): Payer: Medicare Other | Admitting: Physician Assistant

## 2018-08-11 ENCOUNTER — Encounter: Payer: Self-pay | Admitting: Physician Assistant

## 2018-08-11 VITALS — BP 160/64 | HR 64 | Ht 63.0 in | Wt 138.0 lb

## 2018-08-11 DIAGNOSIS — G4452 New daily persistent headache (NDPH): Secondary | ICD-10-CM | POA: Diagnosis not present

## 2018-08-11 DIAGNOSIS — I10 Essential (primary) hypertension: Secondary | ICD-10-CM

## 2018-08-11 DIAGNOSIS — J9611 Chronic respiratory failure with hypoxia: Secondary | ICD-10-CM

## 2018-08-11 DIAGNOSIS — Z1231 Encounter for screening mammogram for malignant neoplasm of breast: Secondary | ICD-10-CM

## 2018-08-11 DIAGNOSIS — J449 Chronic obstructive pulmonary disease, unspecified: Secondary | ICD-10-CM | POA: Diagnosis not present

## 2018-08-11 MED ORDER — HYDRALAZINE HCL 25 MG PO TABS: 25.0000 mg | ORAL_TABLET | Freq: Two times a day (BID) | ORAL | 0 refills | Status: DC

## 2018-08-11 MED ORDER — IPRATROPIUM-ALBUTEROL 0.5-2.5 (3) MG/3ML IN SOLN: 3.0000 mL | RESPIRATORY_TRACT | 1 refills | Status: DC | PRN

## 2018-08-11 MED ORDER — HYDRALAZINE HCL 25 MG PO TABS: 25.0000 mg | ORAL_TABLET | Freq: Two times a day (BID) | ORAL | 2 refills | Status: DC

## 2018-08-11 NOTE — Patient Instructions (Signed)
Start hydralazine twice a day add  Can use duoneb in nebulizer every 2-6 hrs.

## 2018-08-11 NOTE — Progress Notes (Signed)
Subjective:    Patient ID: LOIE JAHR, female    DOB: 1946-06-03, 72 y.o.   MRN: [id number]  HPI  Pt is a 72 yo female with recent PE, COPD, HTN, chronic O2 failure who presents to the clinic for follow up.   She continues to have headaches every day. This has been fully evaluated with MRI/CT. HA are still off and on. Lab work up for pheochromocytoma negative. Never had migraines. She drinks about 2 cups of caffeine a day.   She continues on trelegy. On continuous O2 now. She continues to take clonadine for bP.   .. Active Ambulatory Problems    Diagnosis Date Noted  . History of lung cancer 07/28/2006  . HYPOTHYROIDISM, POSTSURGICAL 05/19/2007  . HYPERCHOLESTEROLEMIA 07/28/2006  . ANEMIA, IRON DEFICIENCY NOS 01/04/2007  . Major depressive disorder, recurrent episode ([place]) 07/28/2006  . Anxiety state 07/28/2006  . HYPERTENSION, BENIGN SYSTEMIC 07/28/2006  . DISEASE, ISCHEMIC HEART, CHRONIC NOS 05/19/2007  . PERIPHERAL VASCULAR DISEASE, UNSPEC. 07/28/2006  . COPD with chronic bronchitis 07/28/2006  . Hypoxemia 06/22/2013  . History of hip fracture 09/02/2013  . Insomnia 09/04/2013  . GERD (gastroesophageal reflux disease) 09/04/2013  . Sensorineural hearing loss of both ears 11/18/2013  . Systolic murmur 40/98/1191  . Episode of abnormal behavior 11/28/2013  . Word finding difficulty 11/28/2013  . Memory loss 11/28/2013  . Transient cerebral ischemic attack 03/01/2014  . Carotid artery narrowing 02/27/2014  . Clinical depression 02/27/2014  . Cerebrovascular accident, old 06/14/2013  . IFG (impaired fasting glucose) 04/12/2015  . Elevated blood pressure 10/16/2015  . Split S2 (second heart sound) 10/16/2015  . Anxiety as acute reaction to exceptional stress 12/05/2015  . Muscle spasm of back 12/05/2015  . Memory changes 12/05/2015  . Osteopenia 04/30/2016  . Chronic pain of left ankle 12/16/2016  . Left foot pain 02/17/2017  . Cavus foot, acquired 04/28/2017  .  Depression with anxiety 08/01/2017  . Lung cancer ([place]) 08/01/2017  . Exertional chest pain 01/26/2018  . Pulmonary embolism ([place]) 01/29/2018  . Venous stasis of lower extremity 02/25/2018  . Primary osteoarthritis of both hands 02/25/2018  . Chronic respiratory failure ([place]) 04/02/2018  . Breast asymmetry 06/21/2018  . Headache, new daily persistent ([facility]) 07/28/2018  . Uncontrolled stage 2 hypertension 07/30/2018   Resolved Ambulatory Problems    Diagnosis Date Noted  . THRUSH 09/26/2010  . Abdominal bloating 03/04/2011  .  Chronic Bronchopleural fistula    Past Medical History:  Diagnosis Date  . CAD (coronary artery disease)   . Cancer ([place]) 2007  . COPD (chronic obstructive pulmonary disease) ([place]) 9/10  . Hurthle cell adenoma of thyroid 2007  . Hypertension   . Iron deficiency anemia 2/08  . Peripheral vascular disease ([place])   . RLS (restless legs syndrome)       Review of Systems See HPI.     Objective:   Physical Exam  Constitutional: She is oriented to person, place, and time. She appears well-developed and well-nourished.  HENT:  Head: Normocephalic and atraumatic.  Cardiovascular: Normal rate and regular rhythm.  Murmur heard. Pulmonary/Chest: Effort normal. She has no wheezes.  Decreased breath sounds.   Neurological: She is alert and oriented to person, place, and time.  Psychiatric: She has a normal mood and affect. Her behavior is normal.          Assessment & Plan:  Marland KitchenMarland KitchenDiagnoses and all orders for this visit:  Uncontrolled stage 2 hypertension -  hydrALAZINE (APRESOLINE) 25 MG tablet; Take 1 tablet (25 mg total) by mouth 2 (two) times daily.  New persistent daily headache  HYPERTENSION, BENIGN SYSTEMIC  COPD with chronic bronchitis -     ipratropium-albuterol (DUONEB) 0.5-2.5 (3) MG/3ML SOLN; Take 3 mLs by nebulization every 4 (four) hours as needed.  Chronic respiratory failure with hypoxia (HCC)  Other orders -     Discontinue:  hydrALAZINE (APRESOLINE) 25 MG tablet; Take 1 tablet (25 mg total) by mouth 2 (two) times daily.   Hopeful that if BP resolves then HA's will resolve. MrI and CT negative for any cuases of headache.   respiratory therapist seems to think that uncontrolled BP could be due to her O2 stats. She has patient on O2 most all day long. Added duoneb.continue trelegy.   Added hydralazine back for BP control. BP is a little better today. Pt really doesn't think that effected her breathing like she originally thought.    Follow up in 1 week.

## 2018-08-18 ENCOUNTER — Telehealth: Payer: Self-pay

## 2018-08-18 ENCOUNTER — Ambulatory Visit (INDEPENDENT_AMBULATORY_CARE_PROVIDER_SITE_OTHER): Payer: Medicare Other | Admitting: Physician Assistant

## 2018-08-18 ENCOUNTER — Encounter: Payer: Self-pay | Admitting: Physician Assistant

## 2018-08-18 VITALS — BP 158/61 | HR 67 | Temp 98.0°F | Ht 63.0 in | Wt 138.0 lb

## 2018-08-18 DIAGNOSIS — J9611 Chronic respiratory failure with hypoxia: Secondary | ICD-10-CM | POA: Diagnosis not present

## 2018-08-18 DIAGNOSIS — I10 Essential (primary) hypertension: Secondary | ICD-10-CM | POA: Diagnosis not present

## 2018-08-18 DIAGNOSIS — G4452 New daily persistent headache (NDPH): Secondary | ICD-10-CM

## 2018-08-18 MED ORDER — BUTALBITAL-APAP-CAFFEINE 50-325-40 MG PO TABS: 1.0000 | ORAL_TABLET | Freq: Four times a day (QID) | ORAL | 1 refills | Status: DC | PRN

## 2018-08-18 MED ORDER — GABAPENTIN 100 MG PO CAPS: 100.0000 mg | ORAL_CAPSULE | Freq: Three times a day (TID) | ORAL | 1 refills | Status: DC

## 2018-08-18 NOTE — Telephone Encounter (Signed)
Pt called- waiting for 2 new RX to be sent to pharmacy  Can these please be sent for pt? Thanks

## 2018-08-18 NOTE — Progress Notes (Signed)
Subjective:    Patient ID: [name], female    DOB: May 17, 1946, 72 y.o.   MRN: [id number]  HPI  Pt is a 72 yo female with uncontrolled HTN, recent PE, hx of TIA, COPD with chronic respiratory failure who presents to the clinic for 1 week follow up on HTN and Headaches.   Her BP continue to be uncontrolled all over the place and having headaches. Her headaches are all the time but seem to be worse at bedtime. Her husband accompanies her and states "she lays in the bed crying at night saying her head is going to explode".   She recently went on continuous O2 suspecting her hypoxia state was triggering BP and headaches. She does feel like when her BP's are under 140 she does not have headaches. She continues to be SOB with exertion. She is using trelegy and albuterol nebulizer twice a day.   .. Active Ambulatory Problems    Diagnosis Date Noted  . History of lung cancer 07/28/2006  . HYPOTHYROIDISM, POSTSURGICAL 05/19/2007  . HYPERCHOLESTEROLEMIA 07/28/2006  . ANEMIA, IRON DEFICIENCY NOS 01/04/2007  . Major depressive disorder, recurrent episode ([place]) 07/28/2006  . Anxiety state 07/28/2006  . HYPERTENSION, BENIGN SYSTEMIC 07/28/2006  . DISEASE, ISCHEMIC HEART, CHRONIC NOS 05/19/2007  . PERIPHERAL VASCULAR DISEASE, UNSPEC. 07/28/2006  . COPD with chronic bronchitis 07/28/2006  . Hypoxemia 06/22/2013  . History of hip fracture 09/02/2013  . Insomnia 09/04/2013  . GERD (gastroesophageal reflux disease) 09/04/2013  . Sensorineural hearing loss of both ears 11/18/2013  . Systolic murmur 67/67/2094  . Episode of abnormal behavior 11/28/2013  . Word finding difficulty 11/28/2013  . Memory loss 11/28/2013  . Transient cerebral ischemic attack 03/01/2014  . Carotid artery narrowing 02/27/2014  . Clinical depression 02/27/2014  . Cerebrovascular accident, old 06/14/2013  . IFG (impaired fasting glucose) 04/12/2015  . Elevated blood pressure 10/16/2015  . Split S2 (second heart  sound) 10/16/2015  . Anxiety as acute reaction to exceptional stress 12/05/2015  . Muscle spasm of back 12/05/2015  . Memory changes 12/05/2015  . Osteopenia 04/30/2016  . Chronic pain of left ankle 12/16/2016  . Left foot pain 02/17/2017  . Cavus foot, acquired 04/28/2017  . Depression with anxiety 08/01/2017  . Lung cancer ([place]) 08/01/2017  . Exertional chest pain 01/26/2018  . Pulmonary embolism ([place]) 01/29/2018  . Venous stasis of lower extremity 02/25/2018  . Primary osteoarthritis of both hands 02/25/2018  . Chronic respiratory failure ([place]) 04/02/2018  . Breast asymmetry 06/21/2018  . Headache, new daily persistent ([facility]) 07/28/2018  . Uncontrolled stage 2 hypertension 07/30/2018   Resolved Ambulatory Problems    Diagnosis Date Noted  . THRUSH 09/26/2010  . Abdominal bloating 03/04/2011  .  Chronic Bronchopleural fistula    Past Medical History:  Diagnosis Date  . CAD (coronary artery disease)   . Cancer ([place]) 2007  . COPD (chronic obstructive pulmonary disease) ([place]) 9/10  . Hurthle cell adenoma of thyroid 2007  . Hypertension   . Iron deficiency anemia 2/08  . Peripheral vascular disease ([place])   . RLS (restless legs syndrome)        Review of Systems See HPI.     Objective:   Physical Exam  Constitutional: She is oriented to person, place, and time. She appears well-developed.  HENT:  Head: Normocephalic and atraumatic.  Eyes: Conjunctivae are normal.  Cardiovascular: Normal rate and regular rhythm.  Pulmonary/Chest: Effort normal and breath sounds normal.  On 2L O2.  Absent  LUQ lung sounds.   Neurological: She is alert and oriented to person, place, and time. No cranial nerve deficit. Coordination normal.  Psychiatric: She has a normal mood and affect. Her behavior is normal.          Assessment & Plan:  Marland KitchenMarland KitchenNera was seen today for follow-up.  Diagnoses and all orders for this visit:  Uncontrolled stage 2 hypertension -     Ambulatory  referral to Cardiology  Chronic respiratory failure with hypoxia (HCC)  Headache, new daily persistent ([facility]) -     gabapentin (NEURONTIN) 100 MG capsule; Take 1 capsule (100 mg total) by mouth 3 (three) times daily. -     butalbital-acetaminophen-caffeine (FIORICET, ESGIC) 50-325-40 MG tablet; Take 1-2 tablets by mouth every 6 (six) hours as needed for headache.  Other orders -     Discontinue: butalbital-acetaminophen-caffeine (FIORICET, ESGIC) 50-325-40 MG tablet; Take 1-2 tablets by mouth every 6 (six) hours as needed for headache.   BP 2nd recheck is better but very confusing. Almost everything I add has no real effect on BP. She could not tolerate norvasc due to the lower extremity edema.  On clonadine, hydralazine, valsartan. Pulmonology concerned about BB and worsening lung function. Will refer to HTN.   It seems likely the HA's are from BP. CT and MRI both unremarkable. Due to severity at night. fioricet given as needed. Discussed controlled substance and abuse potential. Will also start gabapentin for preventative. Concerned about SE's with topamax and TCAs. Discussed side effects. Slowly titrate up to three times a day increasing every 3-5 days on dose. Warned of possible sedation.   Follow up in 2 weeks.

## 2018-08-18 NOTE — Telephone Encounter (Signed)
Pt advised.

## 2018-08-18 NOTE — Patient Instructions (Signed)
Fioricet at bedtime or for moderate to severe headache.  Gabapentin 100mg  three times a day for headache prevention.

## 2018-08-18 NOTE — Telephone Encounter (Signed)
Sent fiorcet printed but I sent electronically now.

## 2018-08-20 ENCOUNTER — Encounter: Payer: Self-pay | Admitting: Physician Assistant

## 2018-08-27 ENCOUNTER — Telehealth: Payer: Self-pay

## 2018-08-27 NOTE — Telephone Encounter (Signed)
Spoke with Dr t., he states pt can come off medication, no need to taper.   Pt advised to go ahead and stop medication and make office visit next week if SX persist. Pt understands and agreeable

## 2018-08-27 NOTE — Telephone Encounter (Signed)
Pt left msg with answering system during lunch stating   She "wants to let dr. Gwyndolyn Kaufman that she is stopping her med. Has high BP, heart palpitations, cant eat, headache, dizzy and diarrhea." Pt refused service, wants to talk to office staff only.   I have placed a call to pt and left a msg asking she call back and provide more detail

## 2018-08-27 NOTE — Telephone Encounter (Signed)
Pt called back- she states she is having noted SX from her Hydralazine 25 mg.  She is stopping medication and wants to know if she should wean off or if she can just stop   Please advise  Note to covering provider

## 2018-08-31 NOTE — Progress Notes (Signed)
Referring-Jade Breeback PA-C Reason for referral-hypertension  HPI: FU hypertension.  Echocardiogram April 2019 showed normal LV function.  CTA April 2019 showed pulmonary emboli in right pulmonary artery.  Patient has chronic dyspnea on exertion but denies orthopnea, PND, pedal edema, chest pain or syncope.  Current Outpatient Medications  Medication Sig Dispense Refill  . albuterol (PROVENTIL HFA;VENTOLIN HFA) 108 (90 Base) MCG/ACT inhaler Inhale 2 puffs into the lungs every 6 (six) hours as needed for wheezing or shortness of breath. 3 Inhaler 3  . albuterol (PROVENTIL) (2.5 MG/3ML) 0.083% nebulizer solution Take 3 mLs (2.5 mg total) by nebulization every 6 (six) hours as needed. 1080 mL 3  . ALPRAZolam (XANAX) 1 MG tablet Take 1 tablet (1 mg total) by mouth daily as needed for anxiety. 90 tablet 1  . AMBULATORY NON FORMULARY MEDICATION Hypoxia with positive walk test. O2 2L as needed for SOB and decreased O2 stats with exertion. Pt request portable O2 tanks only. 1 Device 0  . aspirin EC 81 MG tablet Take 81 mg by mouth daily.    Marland Kitchen atorvastatin (LIPITOR) 20 MG tablet Take 1 tablet (20 mg total) by mouth at bedtime. take 1 tablet by mouth once daily 90 tablet 4  . butalbital-acetaminophen-caffeine (FIORICET, ESGIC) 50-325-40 MG tablet Take 1-2 tablets by mouth every 6 (six) hours as needed for headache. 30 tablet 1  . cloNIDine (CATAPRES - DOSED IN MG/24 HR) 0.2 mg/24hr patch Place 1 patch (0.2 mg total) onto the skin once a week. 4 patch 12  . Fluticasone-Umeclidin-Vilant (TRELEGY ELLIPTA) 100-62.5-25 MCG/INH AEPB Inhale 1 puff into the lungs daily. 180 each 1  . gabapentin (NEURONTIN) 100 MG capsule Take 1 capsule (100 mg total) by mouth 3 (three) times daily. 90 capsule 1  . hydrALAZINE (APRESOLINE) 25 MG tablet Take 1 tablet (25 mg total) by mouth 2 (two) times daily. 60 tablet 0  . ipratropium-albuterol (DUONEB) 0.5-2.5 (3) MG/3ML SOLN Take 3 mLs by nebulization every 4 (four) hours  as needed. 360 mL 1  . levothyroxine (SYNTHROID, LEVOTHROID) 50 MCG tablet take 1 tablet by mouth once daily 90 tablet 1  . QUEtiapine (SEROQUEL) 100 MG tablet Take 1 tablet (100 mg total) by mouth at bedtime. 90 tablet 1  . valsartan (DIOVAN) 320 MG tablet Take 1 tablet (320 mg total) by mouth daily. 90 tablet 1  . vortioxetine HBr (TRINTELLIX) 10 MG TABS tablet Take 1 tablet (10 mg total) by mouth daily. 90 tablet 1   No current facility-administered medications for this visit.     Allergies  Allergen Reactions  . Desvenlafaxine Succinate Er Other (See Comments)    shaking  . Lisinopril-Hydrochlorothiazide     REACTION: N/V cough  . Nitrofurantoin Monohyd Macro   . Norvasc [Amlodipine]     Lower extremity edema  . Penicillins     REACTION: Hives  . Zoloft [Sertraline Hcl] Other (See Comments)    "too many side effects"     Past Medical History:  Diagnosis Date  . 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 CAD (coronary artery disease)    20% stenosis on cath - no intervention required (W-S cards), negative nuclear stress test 11/07  . Cancer [facility]) 2007   lung (Dr. Earlie Server and Dr. Arlyce Dice)  . COPD (chronic obstructive pulmonary disease) ([place]) 9/10   golds stage III, FeV1 39%  . Hurthle cell adenoma of thyroid 2007  . Hypertension   . Iron deficiency anemia 2/08   s/p 2 unit transfusion  .  Lung cancer ([place])   . Peripheral vascular disease ([place])   . RLS (restless legs syndrome)     Past Surgical History:  Procedure Laterality Date  . APPENDECTOMY  18  . CHOLECYSTECTOMY  18  . heart cartherization    . LUL  2005   LUL wedge resection/VATS  . LUL  4/08   LUL lobectomy for cystic cavity and Candida, no cancer seen  . THYROIDECTOMY, PARTIAL  2007   Dr. Arlyce Dice    Social History   Socioeconomic History  . Marital status: Married    Spouse name: Not on file  . Number of children: 4  . Years of education: Not on file  . Highest education level: Not on file  Occupational  History  . Not on file  Social Needs  . Financial resource strain: Not on file  . Food insecurity:    Worry: Not on file    Inability: Not on file  . Transportation needs:    Medical: Not on file    Non-medical: Not on file  Tobacco Use  . Smoking status: Former Smoker    Packs/day: 1.50    Years: 30.00    Pack years: 45.00    Types: Cigarettes    Last attempt to quit: 10/20/2004    Years since quitting: 13.8  . Smokeless tobacco: Never Used  Substance and Sexual Activity  . Alcohol use: No  . Drug use: No  . Sexual activity: Not on file  Lifestyle  . Physical activity:    Days per week: Not on file    Minutes per session: Not on file  . Stress: Not on file  Relationships  . Social connections:    Talks on phone: Not on file    Gets together: Not on file    Attends religious service: Not on file    Active member of club or organization: Not on file    Attends meetings of clubs or organizations: Not on file    Relationship status: Not on file  . Intimate partner violence:    Fear of current or ex partner: Not on file    Emotionally abused: Not on file    Physically abused: Not on file    Forced sexual activity: Not on file  Other Topics Concern  . Not on file  Social History Narrative  . Not on file    Family History  Problem Relation Age of Onset  . Cancer Mother        Larynx & Endometrial cancer  . Cancer Father        laryngeal cancer  . Hypertension Father   . Diabetes Sister   . Multiple sclerosis Sister   . Cancer Sister 25       breast    ROS: no fevers or chills, productive cough, hemoptysis, dysphasia, odynophagia, melena, hematochezia, dysuria, hematuria, rash, seizure activity, orthopnea, PND, pedal edema, claudication. Remaining systems are negative.  Physical Exam:   Blood pressure (!) 190/64, pulse 78, height 5\' 3"  (1.6 m), weight 134 lb 12.8 oz (61.1 kg).  General:  Well developed/well nourished in NAD Skin warm/dry Patient not  depressed No peripheral clubbing Back-normal HEENT-normal/normal eyelids Neck supple/normal carotid upstroke bilaterally; no bruits; no JVD; no thyromegaly chest - CTA/ normal expansion CV - RRR/normal S1 and S2; no murmurs, rubs or gallops;  PMI nondisplaced Abdomen -NT/ND, no HSM, no mass, + bowel sounds, positive bruit 2+ femoral pulses, no bruits Ext-no edema Neuro-grossly nonfocal  ECG -normal  sinus rhythm at a rate of 78.  Nonspecific ST changes.  Personally reviewed  A/P  1 hypertension-patient's blood pressure is elevated and she states it has been more difficult to control recently.  We will schedule renal Dopplers to exclude renal artery stenosis.  She had metanephrines and catecholamines drawn recently that were negative.  She did not tolerate amlodipine in the past because of pedal edema.  Continue ARB and clonidine.  She states hydralazine made her "feel funny".  I will try Spironolactone 25 mg daily.  Check potassium and renal function in 1 week.  Follow blood pressure and adjust regimen as needed.  I might consider adding labetalol in the future.  She does have COPD but there is no wheezing on examination.  2 abdominal bruit-schedule ultrasound to exclude aneurysm.  3 hyperlipidemia-continue statin.  4 COPD-Per primary care.  Kirk Ruths, MD

## 2018-09-07 ENCOUNTER — Encounter: Payer: Self-pay | Admitting: Cardiology

## 2018-09-08 ENCOUNTER — Ambulatory Visit (INDEPENDENT_AMBULATORY_CARE_PROVIDER_SITE_OTHER): Payer: Medicare Other | Admitting: Cardiology

## 2018-09-08 ENCOUNTER — Encounter: Payer: Self-pay | Admitting: Cardiology

## 2018-09-08 VITALS — BP 190/64 | HR 78 | Ht 63.0 in | Wt 134.8 lb

## 2018-09-08 DIAGNOSIS — I10 Essential (primary) hypertension: Secondary | ICD-10-CM

## 2018-09-08 DIAGNOSIS — R109 Unspecified abdominal pain: Secondary | ICD-10-CM | POA: Diagnosis not present

## 2018-09-08 DIAGNOSIS — K5792 Diverticulitis of intestine, part unspecified, without perforation or abscess without bleeding: Secondary | ICD-10-CM | POA: Diagnosis not present

## 2018-09-08 DIAGNOSIS — R55 Syncope and collapse: Secondary | ICD-10-CM | POA: Diagnosis not present

## 2018-09-08 DIAGNOSIS — K551 Chronic vascular disorders of intestine: Secondary | ICD-10-CM | POA: Diagnosis not present

## 2018-09-08 DIAGNOSIS — Z87891 Personal history of nicotine dependence: Secondary | ICD-10-CM

## 2018-09-08 DIAGNOSIS — Z7982 Long term (current) use of aspirin: Secondary | ICD-10-CM | POA: Diagnosis not present

## 2018-09-08 DIAGNOSIS — R918 Other nonspecific abnormal finding of lung field: Secondary | ICD-10-CM | POA: Diagnosis not present

## 2018-09-08 DIAGNOSIS — K5732 Diverticulitis of large intestine without perforation or abscess without bleeding: Secondary | ICD-10-CM | POA: Diagnosis not present

## 2018-09-08 DIAGNOSIS — E7800 Pure hypercholesterolemia, unspecified: Secondary | ICD-10-CM | POA: Diagnosis not present

## 2018-09-08 DIAGNOSIS — F419 Anxiety disorder, unspecified: Secondary | ICD-10-CM | POA: Diagnosis not present

## 2018-09-08 DIAGNOSIS — Z7902 Long term (current) use of antithrombotics/antiplatelets: Secondary | ICD-10-CM | POA: Diagnosis not present

## 2018-09-08 DIAGNOSIS — S0990XA Unspecified injury of head, initial encounter: Secondary | ICD-10-CM | POA: Diagnosis not present

## 2018-09-08 DIAGNOSIS — Z8673 Personal history of transient ischemic attack (TIA), and cerebral infarction without residual deficits: Secondary | ICD-10-CM | POA: Diagnosis not present

## 2018-09-08 DIAGNOSIS — R0989 Other specified symptoms and signs involving the circulatory and respiratory systems: Secondary | ICD-10-CM | POA: Diagnosis not present

## 2018-09-08 DIAGNOSIS — Z880 Allergy status to penicillin: Secondary | ICD-10-CM | POA: Diagnosis not present

## 2018-09-08 DIAGNOSIS — R9431 Abnormal electrocardiogram [ECG] [EKG]: Secondary | ICD-10-CM | POA: Diagnosis not present

## 2018-09-08 DIAGNOSIS — K862 Cyst of pancreas: Secondary | ICD-10-CM | POA: Diagnosis not present

## 2018-09-08 DIAGNOSIS — Z79899 Other long term (current) drug therapy: Secondary | ICD-10-CM | POA: Diagnosis not present

## 2018-09-08 DIAGNOSIS — J449 Chronic obstructive pulmonary disease, unspecified: Secondary | ICD-10-CM | POA: Diagnosis not present

## 2018-09-08 DIAGNOSIS — Z7901 Long term (current) use of anticoagulants: Secondary | ICD-10-CM | POA: Diagnosis not present

## 2018-09-08 DIAGNOSIS — Z85118 Personal history of other malignant neoplasm of bronchus and lung: Secondary | ICD-10-CM | POA: Diagnosis not present

## 2018-09-08 DIAGNOSIS — Z888 Allergy status to other drugs, medicaments and biological substances status: Secondary | ICD-10-CM | POA: Diagnosis not present

## 2018-09-08 DIAGNOSIS — R188 Other ascites: Secondary | ICD-10-CM | POA: Diagnosis not present

## 2018-09-08 MED ORDER — SPIRONOLACTONE 25 MG PO TABS: 25.0000 mg | ORAL_TABLET | Freq: Every day | ORAL | 3 refills | Status: DC

## 2018-09-08 NOTE — Patient Instructions (Addendum)
Medication Instructions:   START SPIRONOLACTONE 25 MG ONCE DAILY  Labwork:  Your physician recommends that you return for lab work in: [place]   Testing/Procedures:  Your physician has requested that you have an abdominal aorta duplex. During this test, an ultrasound is used to evaluate the aorta. Allow 30 minutes for this exam. Do not eat after midnight the day before and avoid carbonated beverages   Follow-Up:  Your physician recommends that you schedule a follow-up appointment in: [place]

## 2018-09-13 ENCOUNTER — Encounter: Payer: Self-pay | Admitting: Physician Assistant

## 2018-09-13 ENCOUNTER — Ambulatory Visit (INDEPENDENT_AMBULATORY_CARE_PROVIDER_SITE_OTHER): Payer: Medicare Other | Admitting: Physician Assistant

## 2018-09-13 VITALS — BP 183/63 | HR 82 | Temp 97.8°F | Wt 135.7 lb

## 2018-09-13 DIAGNOSIS — K5792 Diverticulitis of intestine, part unspecified, without perforation or abscess without bleeding: Secondary | ICD-10-CM

## 2018-09-13 DIAGNOSIS — I10 Essential (primary) hypertension: Secondary | ICD-10-CM

## 2018-09-13 DIAGNOSIS — R55 Syncope and collapse: Secondary | ICD-10-CM | POA: Diagnosis not present

## 2018-09-13 DIAGNOSIS — J441 Chronic obstructive pulmonary disease with (acute) exacerbation: Secondary | ICD-10-CM | POA: Diagnosis not present

## 2018-09-13 MED ORDER — LABETALOL HCL 100 MG PO TABS: 100.0000 mg | ORAL_TABLET | Freq: Two times a day (BID) | ORAL | 1 refills | Status: DC

## 2018-09-13 MED ORDER — PREDNISONE 20 MG PO TABS: ORAL_TABLET | ORAL | 0 refills | Status: DC

## 2018-09-13 MED ORDER — ONDANSETRON 8 MG PO TBDP: 8.0000 mg | ORAL_TABLET | Freq: Three times a day (TID) | ORAL | 0 refills | Status: DC | PRN

## 2018-09-13 NOTE — Progress Notes (Signed)
Subjective:    Patient ID: [name], female    DOB: October 21, 1945, 72 y.o.   MRN: [id number]  HPI  Pt is a 72 yo female with COPD, chronic hypoxia, uncontrolled HTN, hx of PE, PVD who presents to the clinic for follow up.   Since last visit she did see [name] from cardiology. He ordered renal u/s and abdominal u/s to rule out RAS and abdominal aneurysm. Renal u/s has not been done but patient when to ED same day for syncopal episode and CT of abdomin was done and negative for aneurysm.  [name] added spirolactone to BP regimen to help with BP control.   She had not started spirolactone when she had a syncopal episode where husband said "she just laid over". She was unresponsive for 5-6 minutes with gurgling. She did urinate on herself. When she awakened he took her to ED. She did hit the back of her head. CT of head and CTA of abdomen where done. No anersym but did find some diverticulitis. Treated with cipro/flagyl.   She has had an increase in productive cough. No fever, chills, body aches. She does feel more SOB. Not doing anything different in breathing regimen.   .. Active Ambulatory Problems    Diagnosis Date Noted  . History of lung cancer 07/28/2006  . HYPOTHYROIDISM, POSTSURGICAL 05/19/2007  . HYPERCHOLESTEROLEMIA 07/28/2006  . ANEMIA, IRON DEFICIENCY NOS 01/04/2007  . Major depressive disorder, recurrent episode ([place]) 07/28/2006  . Anxiety state 07/28/2006  . HYPERTENSION, BENIGN SYSTEMIC 07/28/2006  . DISEASE, ISCHEMIC HEART, CHRONIC NOS 05/19/2007  . PERIPHERAL VASCULAR DISEASE, UNSPEC. 07/28/2006  . COPD with chronic bronchitis 07/28/2006  . Hypoxemia 06/22/2013  . History of hip fracture 09/02/2013  . Insomnia 09/04/2013  . GERD (gastroesophageal reflux disease) 09/04/2013  . Sensorineural hearing loss of both ears 11/18/2013  . Systolic murmur 62/70/3500  . Episode of abnormal behavior 11/28/2013  . Word finding difficulty 11/28/2013  . Memory loss  11/28/2013  . Transient cerebral ischemic attack 03/01/2014  . Carotid artery narrowing 02/27/2014  . Clinical depression 02/27/2014  . Cerebrovascular accident, old 06/14/2013  . IFG (impaired fasting glucose) 04/12/2015  . Elevated blood pressure 10/16/2015  . Split S2 (second heart sound) 10/16/2015  . Anxiety as acute reaction to exceptional stress 12/05/2015  . Muscle spasm of back 12/05/2015  . Memory changes 12/05/2015  . Osteopenia 04/30/2016  . Chronic pain of left ankle 12/16/2016  . Left foot pain 02/17/2017  . Cavus foot, acquired 04/28/2017  . Depression with anxiety 08/01/2017  . Lung cancer ([place]) 08/01/2017  . Exertional chest pain 01/26/2018  . Pulmonary embolism ([place]) 01/29/2018  . Venous stasis of lower extremity 02/25/2018  . Primary osteoarthritis of both hands 02/25/2018  . Chronic respiratory failure ([place]) 04/02/2018  . Breast asymmetry 06/21/2018  . Headache, new daily persistent ([place]) 07/28/2018  . Uncontrolled stage 2 hypertension 07/30/2018   Resolved Ambulatory Problems    Diagnosis Date Noted  . THRUSH 09/26/2010  . Abdominal bloating 03/04/2011  .  Chronic Bronchopleural fistula    Past Medical History:  Diagnosis Date  . CAD (coronary artery disease)   . Cancer ([place]) 2007  . COPD (chronic obstructive pulmonary disease) ([place]) 9/10  . Hurthle cell adenoma of thyroid 2007  . Hypertension   . Iron deficiency anemia 2/08  . Peripheral vascular disease ([place])   . RLS (restless legs syndrome)            Review of Systems  See HPI.     Objective:   Physical Exam  Constitutional: She is oriented to person, place, and time. She appears well-developed and well-nourished.  HENT:  Head: Normocephalic and atraumatic.  Cardiovascular: Normal rate and regular rhythm.  Pulmonary/Chest: Effort normal. She has no wheezes.  Coarse breath sounds.  Productive cough on exam.   Neurological: She is alert and oriented to person, place, and time.   Psychiatric: She has a normal mood and affect. Her behavior is normal.          Assessment & Plan:  Marland KitchenMarland KitchenToshiko was seen today for hospitalization follow-up.  Diagnoses and all orders for this visit:  Syncope, unspecified syncope type -     Ambulatory referral to Neurology  Uncontrolled stage 2 hypertension -     labetalol (NORMODYNE) 100 MG tablet; Take 1 tablet (100 mg total) by mouth 2 (two) times daily.  COPD exacerbation (HCC) -     predniSONE (DELTASONE) 20 MG tablet; Take 3 tablets for 3 days, take 2 tablets for 3 days, take 1 tablets for 3 days, take 1/2 tablets for 4 days.  Diverticulitis -     ondansetron (ZOFRAN-ODT) 8 MG disintegrating tablet; Take 1 tablet (8 mg total) by mouth every 8 (eight) hours as needed for nausea or vomiting.   Her lungs are worrisome for a COPD flare. Start prednisone taper and mucinex at dinner.   Start labetolol due to [name] last note with spirolactone for BP control. Will check renal function on Wednesday of this week.   Syncope does not sound like vaso-vagal, perhaps due to some BP changes but thus far cardiology work up as been great so will refer to neurology especially since bladder function was lost. MRI of head have been done and unremarkable.

## 2018-09-13 NOTE — Patient Instructions (Addendum)
mucinex 600mg  at dinner and nebulizer 1 hour before bed.  Prednisone taper.  Get blood work Wednesday.  Start labetolol 100mg  twice a day.    Will refer to neurology.

## 2018-09-14 ENCOUNTER — Telehealth: Payer: Self-pay

## 2018-09-14 NOTE — Telephone Encounter (Signed)
[name] called to say she wants the Neurology referral to be in [place].

## 2018-09-15 ENCOUNTER — Encounter: Payer: Self-pay | Admitting: Physician Assistant

## 2018-09-15 NOTE — Telephone Encounter (Signed)
Done

## 2018-09-20 DIAGNOSIS — E7800 Pure hypercholesterolemia, unspecified: Secondary | ICD-10-CM | POA: Diagnosis not present

## 2018-09-21 ENCOUNTER — Encounter (HOSPITAL_COMMUNITY): Payer: Medicare Other

## 2018-09-21 ENCOUNTER — Other Ambulatory Visit (HOSPITAL_COMMUNITY): Payer: Medicare Other

## 2018-09-21 LAB — BASIC METABOLIC PANEL WITH GFR
BUN: 14 mg/dL (ref 7–25)
CO2: 36 mmol/L — ABNORMAL HIGH (ref 20–32)
Calcium: 9 mg/dL (ref 8.6–10.4)
Chloride: 91 mmol/L — ABNORMAL LOW (ref 98–110)
Creat: 0.6 mg/dL (ref 0.60–0.93)
GFR, Est African American: 106 mL/min/{1.73_m2} (ref >=60)
GFR, Est Non African American: 92 mL/min/{1.73_m2} (ref >=60)
Glucose, Bld: 122 mg/dL — ABNORMAL HIGH (ref 65–99)
Potassium: 4.1 mmol/L (ref 3.5–5.3)
Sodium: 131 mmol/L — ABNORMAL LOW (ref 135–146)

## 2018-09-21 LAB — LIPID PANEL W/REFLEX DIRECT LDL
Cholesterol: 141 mg/dL (ref <200)
HDL: 70 mg/dL (ref >50)
LDL Cholesterol (Calc): 56 mg/dL (calc)
Non-HDL Cholesterol (Calc): 71 mg/dL (calc) (ref <130)
Total CHOL/HDL Ratio: 2 (calc) (ref <5.0)
Triglycerides: 72 mg/dL (ref <150)

## 2018-09-21 NOTE — Progress Notes (Signed)
Call pt: cholesterol looks great. Kidney function great.  Sodium is a little low. Increase salt intake and can recheck in 3-4 weeks.

## 2018-09-22 ENCOUNTER — Other Ambulatory Visit: Payer: Self-pay

## 2018-09-22 ENCOUNTER — Ambulatory Visit
Admission: RE | Admit: 2018-09-22 | Discharge: 2018-09-22 | Disposition: A | Discharge status: Home / Self Care | Payer: Medicare Other | Source: Ambulatory Visit | Attending: Physician Assistant | Admitting: Physician Assistant

## 2018-09-22 DIAGNOSIS — Z1231 Encounter for screening mammogram for malignant neoplasm of breast: Secondary | ICD-10-CM | POA: Diagnosis not present

## 2018-09-22 DIAGNOSIS — F418 Other specified anxiety disorders: Secondary | ICD-10-CM

## 2018-09-22 DIAGNOSIS — I10 Essential (primary) hypertension: Secondary | ICD-10-CM

## 2018-09-22 MED ORDER — FLUTICASONE-UMECLIDIN-VILANT 100-62.5-25 MCG/INH IN AEPB: 1.0000 | INHALATION_SPRAY | Freq: Every day | RESPIRATORY_TRACT | 1 refills | Status: DC

## 2018-09-22 MED ORDER — ALPRAZOLAM 1 MG PO TABS: 1.0000 mg | ORAL_TABLET | Freq: Every day | ORAL | 1 refills | Status: DC | PRN

## 2018-09-22 MED ORDER — VALSARTAN 320 MG PO TABS: 320.0000 mg | ORAL_TABLET | Freq: Every day | ORAL | 1 refills | Status: DC

## 2018-09-23 NOTE — Progress Notes (Signed)
Call pt: normal mammogram. Follow up in 1 year.

## 2018-10-22 NOTE — Telephone Encounter (Signed)
No action is needed.

## 2018-10-26 ENCOUNTER — Other Ambulatory Visit: Payer: Self-pay | Admitting: Physician Assistant

## 2018-10-26 DIAGNOSIS — I10 Essential (primary) hypertension: Secondary | ICD-10-CM

## 2018-11-17 ENCOUNTER — Other Ambulatory Visit: Payer: Self-pay | Admitting: Physician Assistant

## 2018-11-17 ENCOUNTER — Telehealth: Payer: Self-pay

## 2018-11-17 DIAGNOSIS — G4452 New daily persistent headache (NDPH): Secondary | ICD-10-CM

## 2018-11-17 NOTE — Telephone Encounter (Signed)
Patient called today requesting a note to be written and signed for her stating patient needs help at home with activities of daily living. PCP approved note, and signed note. Note was placed at the front desk for pick-up, no further questions or concerns at this time.

## 2018-11-23 DIAGNOSIS — J449 Chronic obstructive pulmonary disease, unspecified: Secondary | ICD-10-CM | POA: Diagnosis not present

## 2018-11-23 DIAGNOSIS — R55 Syncope and collapse: Secondary | ICD-10-CM | POA: Diagnosis not present

## 2018-11-23 DIAGNOSIS — C3490 Malignant neoplasm of unspecified part of unspecified bronchus or lung: Secondary | ICD-10-CM | POA: Diagnosis not present

## 2018-11-25 ENCOUNTER — Other Ambulatory Visit: Payer: Self-pay | Admitting: Physician Assistant

## 2018-11-25 DIAGNOSIS — G4452 New daily persistent headache (NDPH): Secondary | ICD-10-CM

## 2018-12-01 NOTE — Progress Notes (Signed)
HPI: FU hypertension.  Echocardiogram April 2019 showed normal LV function.  CTA April 2019 showed pulmonary emboli in right pulmonary artery.  Had syncopal episode in November.  CT at outside facility did not show aortic aneurysm and there is no mention of renal artery stenosis.  Since last seen, she denies dyspnea, chest pain, palpitations or syncope.  Her systolic blood pressure is averaging 150-160 by her report.  Current Outpatient Medications  Medication Sig Dispense Refill  . albuterol (PROVENTIL HFA;VENTOLIN HFA) 108 (90 Base) MCG/ACT inhaler Inhale 2 puffs into the lungs every 6 (six) hours as needed for wheezing or shortness of breath. 3 Inhaler 3  . albuterol (PROVENTIL) (2.5 MG/3ML) 0.083% nebulizer solution Take 3 mLs (2.5 mg total) by nebulization every 6 (six) hours as needed. 1080 mL 3  . ALPRAZolam (XANAX) 1 MG tablet Take 1 tablet (1 mg total) by mouth daily as needed for anxiety. 90 tablet 1  . AMBULATORY NON FORMULARY MEDICATION Hypoxia with positive walk test. O2 2L as needed for SOB and decreased O2 stats with exertion. Pt request portable O2 tanks only. 1 Device 0  . aspirin EC 81 MG tablet Take 81 mg by mouth daily.    Marland Kitchen atorvastatin (LIPITOR) 20 MG tablet Take 1 tablet (20 mg total) by mouth at bedtime. take 1 tablet by mouth once daily 90 tablet 4  . butalbital-acetaminophen-caffeine (FIORICET, ESGIC) 50-325-40 MG tablet TAKE 1 TO 2 TABLETS BY MOUTH EVERY 6 HOURS AS NEEDED FOR HEADACHE 30 tablet 0  . cloNIDine (CATAPRES - DOSED IN MG/24 HR) 0.2 mg/24hr patch Place 1 patch (0.2 mg total) onto the skin once a week. 4 patch 12  . Fluticasone-Umeclidin-Vilant (TRELEGY ELLIPTA) 100-62.5-25 MCG/INH AEPB Inhale 1 puff into the lungs daily. 180 each 1  . gabapentin (NEURONTIN) 100 MG capsule TAKE 1 CAPSULE(100 MG) BY MOUTH THREE TIMES DAILY 90 capsule 1  . ipratropium-albuterol (DUONEB) 0.5-2.5 (3) MG/3ML SOLN Take 3 mLs by nebulization every 4 (four) hours as needed. 360 mL  1  . labetalol (NORMODYNE) 100 MG tablet TAKE 1 TABLET(100 MG) BY MOUTH TWICE DAILY 60 tablet 1  . levothyroxine (SYNTHROID, LEVOTHROID) 50 MCG tablet take 1 tablet by mouth once daily 90 tablet 1  . ondansetron (ZOFRAN-ODT) 8 MG disintegrating tablet Take 1 tablet (8 mg total) by mouth every 8 (eight) hours as needed for nausea or vomiting. 20 tablet 0  . predniSONE (DELTASONE) 20 MG tablet Take 3 tablets for 3 days, take 2 tablets for 3 days, take 1 tablets for 3 days, take 1/2 tablets for 4 days. 20 tablet 0  . QUEtiapine (SEROQUEL) 100 MG tablet Take 1 tablet (100 mg total) by mouth at bedtime. 90 tablet 1  . spironolactone (ALDACTONE) 25 MG tablet Take 1 tablet (25 mg total) by mouth daily. 90 tablet 3  . valsartan (DIOVAN) 320 MG tablet Take 1 tablet (320 mg total) by mouth daily. 90 tablet 1  . vortioxetine HBr (TRINTELLIX) 10 MG TABS tablet Take 1 tablet (10 mg total) by mouth daily. 90 tablet 1   No current facility-administered medications for this visit.      Past Medical History:  Diagnosis Date  . 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 CAD (coronary artery disease)    20% stenosis on cath - no intervention required (W-S cards), negative nuclear stress test 11/07  . Cancer [facility]) 2007   lung (Dr. Earlie Server and Dr. Arlyce Dice)  . COPD (chronic obstructive pulmonary disease) ([place]) 9/10  golds stage III, FeV1 39%  . Hurthle cell adenoma of thyroid 2007  . Hypertension   . Iron deficiency anemia 2/08   s/p 2 unit transfusion  . Lung cancer ([place])   . Peripheral vascular disease ([place])   . RLS (restless legs syndrome)     Past Surgical History:  Procedure Laterality Date  . APPENDECTOMY  18  . CHOLECYSTECTOMY  18  . heart cartherization    . LUL  2005   LUL wedge resection/VATS  . LUL  4/08   LUL lobectomy for cystic cavity and Candida, no cancer seen  . THYROIDECTOMY, PARTIAL  2007   Dr. Arlyce Dice    Social History   Socioeconomic History  . Marital status: Married    Spouse  name: Not on file  . Number of children: 4  . Years of education: Not on file  . Highest education level: Not on file  Occupational History  . Not on file  Social Needs  . Financial resource strain: Not on file  . Food insecurity:    Worry: Not on file    Inability: Not on file  . Transportation needs:    Medical: Not on file    Non-medical: Not on file  Tobacco Use  . Smoking status: Former Smoker    Packs/day: 1.50    Years: 30.00    Pack years: 45.00    Types: Cigarettes    Last attempt to quit: 10/20/2004    Years since quitting: 14.1  . Smokeless tobacco: Never Used  Substance and Sexual Activity  . Alcohol use: No  . Drug use: No  . Sexual activity: Not on file  Lifestyle  . Physical activity:    Days per week: Not on file    Minutes per session: Not on file  . Stress: Not on file  Relationships  . Social connections:    Talks on phone: Not on file    Gets together: Not on file    Attends religious service: Not on file    Active member of club or organization: Not on file    Attends meetings of clubs or organizations: Not on file    Relationship status: Not on file  . Intimate partner violence:    Fear of current or ex partner: Not on file    Emotionally abused: Not on file    Physically abused: Not on file    Forced sexual activity: Not on file  Other Topics Concern  . Not on file  Social History Narrative  . Not on file    Family History  Problem Relation Age of Onset  . Cancer Mother        Larynx & Endometrial cancer  . Cancer Father        laryngeal cancer  . Hypertension Father   . Diabetes Sister   . Multiple sclerosis Sister   . Cancer Sister 15       breast    ROS: no fevers or chills, productive cough, hemoptysis, dysphasia, odynophagia, melena, hematochezia, dysuria, hematuria, rash, seizure activity, orthopnea, PND, pedal edema, claudication. Remaining systems are negative.  Physical Exam: Well-developed well-nourished in no acute  distress.  Skin is warm and dry.  HEENT is normal.  Neck is supple.  Chest diminished breath sounds throughout Cardiovascular exam is regular rate and rhythm.  Abdominal exam nontender or distended. No masses palpated. Extremities show no edema. neuro grossly intact   A/P  1 hypertension-blood pressure is mildly elevated.  Increase  labetalol to 200 mg twice daily and follow.  2 history of syncope-she has had occasional dizziness with standing.  Question orthostatic component.  There is also concern that this may have been a seizure though by her report EEG was normal.  She has not had any recurrences.  Will follow for now.  3 hyperlipidemia-management per primary care.  Continue statin.  Kirk Ruths, MD

## 2018-12-07 DIAGNOSIS — R55 Syncope and collapse: Secondary | ICD-10-CM | POA: Diagnosis not present

## 2018-12-15 ENCOUNTER — Ambulatory Visit (INDEPENDENT_AMBULATORY_CARE_PROVIDER_SITE_OTHER): Payer: Medicare Other | Admitting: Cardiology

## 2018-12-15 ENCOUNTER — Encounter: Payer: Self-pay | Admitting: Cardiology

## 2018-12-15 VITALS — BP 150/60 | HR 62 | Ht 63.0 in | Wt 125.8 lb

## 2018-12-15 DIAGNOSIS — I10 Essential (primary) hypertension: Secondary | ICD-10-CM

## 2018-12-15 DIAGNOSIS — R55 Syncope and collapse: Secondary | ICD-10-CM | POA: Diagnosis not present

## 2018-12-15 DIAGNOSIS — E7800 Pure hypercholesterolemia, unspecified: Secondary | ICD-10-CM

## 2018-12-15 MED ORDER — LABETALOL HCL 200 MG PO TABS: 200.0000 mg | ORAL_TABLET | Freq: Two times a day (BID) | ORAL | 6 refills | Status: DC

## 2018-12-15 MED ORDER — LABETALOL HCL 200 MG PO TABS: 200.0000 mg | ORAL_TABLET | Freq: Two times a day (BID) | ORAL | 3 refills | Status: DC

## 2018-12-15 NOTE — Patient Instructions (Signed)
Medication Instructions:   INCREASE LABETALOL TO 200 MG TWICE DAILY=2 OF THE 100 MG TABLETS TWICE DAILY  Follow-Up:  Your physician wants you to follow-up in: [place] will receive a reminder letter in the mail two months in advance. If you don't receive a letter, please call our office to schedule the follow-up appointment.   CALL OIN June TO SCHEDULE APPOINTMENT IN [place]

## 2018-12-17 ENCOUNTER — Encounter: Payer: Self-pay | Admitting: Physician Assistant

## 2018-12-17 ENCOUNTER — Ambulatory Visit (INDEPENDENT_AMBULATORY_CARE_PROVIDER_SITE_OTHER): Payer: Medicare Other | Admitting: Physician Assistant

## 2018-12-17 VITALS — BP 122/62 | HR 61 | Temp 98.2°F | Wt 123.0 lb

## 2018-12-17 DIAGNOSIS — R232 Flushing: Secondary | ICD-10-CM | POA: Diagnosis not present

## 2018-12-17 DIAGNOSIS — F330 Major depressive disorder, recurrent, mild: Secondary | ICD-10-CM | POA: Diagnosis not present

## 2018-12-17 DIAGNOSIS — I10 Essential (primary) hypertension: Secondary | ICD-10-CM | POA: Diagnosis not present

## 2018-12-17 DIAGNOSIS — R413 Other amnesia: Secondary | ICD-10-CM

## 2018-12-17 MED ORDER — VORTIOXETINE HBR 20 MG PO TABS: 20.0000 mg | ORAL_TABLET | Freq: Every day | ORAL | 1 refills | Status: DC

## 2018-12-17 MED ORDER — VORTIOXETINE HBR 20 MG PO TABS: 20.0000 mg | ORAL_TABLET | Freq: Every day | ORAL | 0 refills | Status: DC

## 2018-12-17 NOTE — Progress Notes (Signed)
Subjective:    Patient ID: [name], female    DOB: 03/04/1946, 73 y.o.   MRN: [id number]  HPI  HPI  Pt is a 73 yo female with COPD, chronic hypoxia, uncontrolled HTN, hx of PE, PVD who presents to the clinic for follow up on her elevated blood pressure. She recently saw cardiology on 12/15/2018 where they decided to to increase her labetalol to 200 mg twice a day. Today in office her blood pressure was 122/62. She has been checking it at home and says it still has been fluctuating with the highest systolic being in the 791T and the lowest 100 before the medication change. She endorses dizziness and headaches. Denies any palpitations.   Patient also feels like she is more depressed and anxious lately. She has a lot of at home stressors that she thinks are contributing to her mood. She also feels like her medication is no longer helping her mood. She feels the most anxious before bedtime and states her mind wonders.  Patient states she is concerned about her memory. People around her are telling her she is forgetting a lot of things. She states she can remember day to day things but will forget things like specific to get at the grocery store. She is working on [employer] to help combat this issue.   Patient has not had any more episodes of unresponsiveness since the last one in November. She does endorse some dizziness but denies any recent falls or trauma to the head. She has seen cardiology and a neurologist relating to this incidence. Both think likely due to changes in BP.   .. Active Ambulatory Problems    Diagnosis Date Noted  . History of lung cancer 07/28/2006  . HYPOTHYROIDISM, POSTSURGICAL 05/19/2007  . HYPERCHOLESTEROLEMIA 07/28/2006  . ANEMIA, IRON DEFICIENCY NOS 01/04/2007  . Major depressive disorder, recurrent episode ([place]) 07/28/2006  . Anxiety state 07/28/2006  . HYPERTENSION, BENIGN SYSTEMIC 07/28/2006  . DISEASE, ISCHEMIC HEART, CHRONIC NOS 05/19/2007  .  PERIPHERAL VASCULAR DISEASE, UNSPEC. 07/28/2006  . COPD with chronic bronchitis 07/28/2006  . Hypoxemia 06/22/2013  . History of hip fracture 09/02/2013  . Insomnia 09/04/2013  . GERD (gastroesophageal reflux disease) 09/04/2013  . Sensorineural hearing loss of both ears 11/18/2013  . Systolic murmur 05/69/7948  . Episode of abnormal behavior 11/28/2013  . Word finding difficulty 11/28/2013  . Memory loss 11/28/2013  . Transient cerebral ischemic attack 03/01/2014  . Carotid artery narrowing 02/27/2014  . Clinical depression 02/27/2014  . Cerebrovascular accident, old 06/14/2013  . IFG (impaired fasting glucose) 04/12/2015  . Elevated blood pressure 10/16/2015  . Split S2 (second heart sound) 10/16/2015  . Anxiety as acute reaction to exceptional stress 12/05/2015  . Muscle spasm of back 12/05/2015  . Memory changes 12/05/2015  . Osteopenia 04/30/2016  . Chronic pain of left ankle 12/16/2016  . Left foot pain 02/17/2017  . Cavus foot, acquired 04/28/2017  . Depression with anxiety 08/01/2017  . Lung cancer ([place]) 08/01/2017  . Exertional chest pain 01/26/2018  . Pulmonary embolism ([place]) 01/29/2018  . Venous stasis of lower extremity 02/25/2018  . Primary osteoarthritis of both hands 02/25/2018  . Chronic respiratory failure ([place]) 04/02/2018  . Breast asymmetry 06/21/2018  . Headache, new daily persistent ([facility]) 07/28/2018  . Uncontrolled stage 2 hypertension 07/30/2018   Resolved Ambulatory Problems    Diagnosis Date Noted  . THRUSH 09/26/2010  . Abdominal bloating 03/04/2011  .  Chronic Bronchopleural fistula    Past  Medical History:  Diagnosis Date  . CAD (coronary artery disease)   . Cancer ([place]) 2007  . COPD (chronic obstructive pulmonary disease) ([place]) 9/10  . Hurthle cell adenoma of thyroid 2007  . Hypertension   . Iron deficiency anemia 2/08  . Peripheral vascular disease ([place])   . RLS (restless legs syndrome)      Review of Systems See HPI     Objective:   Physical Exam Constitutional:      Appearance: Normal appearance.  HENT:     Head: Normocephalic.     Nose: Nose normal.     Mouth/Throat:     Pharynx: Oropharynx is clear.  Eyes:     Conjunctiva/sclera: Conjunctivae normal.     Pupils: Pupils are equal, round, and reactive to light.  Cardiovascular:     Rate and Rhythm: Normal rate and regular rhythm.     Heart sounds: Murmur present.  Pulmonary:     Effort: Pulmonary effort is normal.     Breath sounds: Normal breath sounds.  Skin:    General: Skin is warm.     Capillary Refill: Capillary refill takes less than 2 seconds.  Neurological:     General: No focal deficit present.     Mental Status: She is alert and oriented to person, place, and time.  Psychiatric:        Mood and Affect: Mood normal.        Behavior: Behavior normal.        Thought Content: Thought content normal.           Assessment & Plan:  Marland KitchenMarland KitchenNeshia was seen today for follow-up.  Diagnoses and all orders for this visit:  Uncontrolled stage 2 hypertension -     Basic metabolic panel -     CBC with Differential/Platelet -     TSH  Memory changes -     Basic metabolic panel -     CBC with Differential/Platelet -     TSH  Hot flashes -     Basic metabolic panel -     CBC with Differential/Platelet -     TSH  Mild episode of recurrent major depressive disorder (HCC) -     vortioxetine HBr (TRINTELLIX) 20 MG TABS tablet; Take 1 tablet (20 mg total) by mouth daily. -     vortioxetine HBr (TRINTELLIX) 20 MG TABS tablet; Take 1 tablet (20 mg total) by mouth daily.   .. Depression screen [facility] 2/9 12/17/2018 09/13/2018 07/15/2018 02/02/2018 09/09/2017  Decreased Interest 2 3 0 1 0  Down, Depressed, Hopeless 0 - 1 1 0  PHQ - 2 Score 2 3 1 2  0  Altered sleeping 0 0 1 0 0  Tired, decreased energy 3 - 3 1 0  Change in appetite 3 3 3  0 0  Feeling bad or failure about yourself  0 3 0 0 0  Trouble concentrating 0 3 0 0 0  Moving slowly or  fidgety/restless 0 0 0 1 1  Suicidal thoughts 0 0 0 0 0  PHQ-9 Score 8 12 8 4 1   Difficult doing work/chores Not difficult at all Not difficult at all Not difficult at all Not difficult at all Not difficult at all   .Marland Kitchen GAD 7 : Generalized Anxiety Score 12/17/2018 09/13/2018 07/15/2018 02/02/2018  Nervous, Anxious, on Edge 0 3 3 1   Control/stop worrying 3 3 3 1   Worry too much - different things 3 3 3 1   Trouble relaxing 0 3  0 0  Restless 2 2 3  0  Easily annoyed or irritable 3 3 3 2   Afraid - awful might happen 2 3 3 2   Total GAD 7 Score 13 20 18 7   Anxiety Difficulty Not difficult at all Not difficult at all Not difficult at all Not difficult at all    .Marland Kitchen MMSE - Mini Mental State Exam 12/17/2018 07/29/2017  Orientation to time 5 5  Orientation to Place 5 5  Registration 3 3  Attention/ Calculation 1 5  Recall 2 1  Language- name 2 objects 2 2  Language- repeat 1 1  Language- follow 3 step command 3 3  Language- read & follow direction 1 1  Write a sentence 1 1  Copy design 1 1  Total score 25 28   BP looks better today. Continue to check at home. Continue to take increased dose of labetalol. Follow up in 2 weeks with BP check and bring log.   Increased trintellix due to stress/mood. Follow up in 3 months. Her PHQ-9 and GAD-7 scores have decreased since November. Her MMSE did decrease but very minimally. I think her memory issues come from stress/anxiety and not necessarily sign of dementia.   She is having more hot flashes could be due to some anxiety attacks, coming off gabapentin or even trintellix. Will get some labs to evaluate.   Marland Kitchen.Spent 30 minutes with patient and greater than 50 percent of visit spent counseling patient regarding treatment plan.  Marland KitchenVernetta Honey PA-C, have reviewed and agree with the above documentation in it's entirety.

## 2018-12-17 NOTE — Progress Notes (Deleted)
HTN  .. MMSE - Mini Mental State Exam 07/29/2017  Orientation to time 5  Orientation to Place 5  Registration 3  Attention/ Calculation 5  Recall 1  Language- name 2 objects 2  Language- repeat 1  Language- follow 3 step command 3  Language- read & follow direction 1  Write a sentence 1  Copy design 1  Total score 28    labelolol  147  Send to cardiologist.

## 2018-12-17 NOTE — Patient Instructions (Addendum)
Orthostatic Hypotension Blood pressure is a measurement of how strongly, or weakly, your blood is pressing against the walls of your arteries. Orthostatic hypotension is a sudden drop in blood pressure that happens when you quickly change positions, such as when you get up from sitting or lying down. Arteries are blood vessels that carry blood from your heart throughout your body. When blood pressure is too low, you may not get enough blood to your brain or to the rest of your organs. This can cause weakness, light-headedness, rapid heartbeat, and fainting. This can last for just a few seconds or for up to a few minutes. Orthostatic hypotension is usually not a serious problem. However, if it happens frequently or gets worse, it may be a sign of something more serious. What are the causes? This condition may be caused by:  Sudden changes in posture, such as standing up quickly after you have been sitting or lying down.  Blood loss.  Loss of body fluids (dehydration).  Heart problems.  Hormone (endocrine) problems.  Pregnancy.  Severe infection.  Lack of certain nutrients.  Severe allergic reactions (anaphylaxis).  Certain medicines, such as blood pressure medicine or medicines that make the body lose excess fluids (diuretics). Sometimes, this condition can be caused by not taking medicine as directed, such as taking too much of a certain medicine. What increases the risk? The following factors may make you more likely to develop this condition:  Age. Risk increases as you get older.  Conditions that affect the heart or the central nervous system.  Taking certain medicines, such as blood pressure medicine or diuretics.  Being pregnant. What are the signs or symptoms? Symptoms of this condition may include:  Weakness.  Light-headedness.  Dizziness.  Blurred vision.  Fatigue.  Rapid heartbeat.  Fainting, in severe cases. How is this diagnosed? This condition is  diagnosed based on:  Your medical history.  Your symptoms.  Your blood pressure measurement. Your health care provider will check your blood pressure when you are: ? Lying down. ? Sitting. ? Standing. A blood pressure reading is recorded as two numbers, such as "120 over 80" (or 120/80). The first ("top") number is called the systolic pressure. It is a measure of the pressure in your arteries as your heart beats. The second ("bottom") number is called the diastolic pressure. It is a measure of the pressure in your arteries when your heart relaxes between beats. Blood pressure is measured in a unit called mm Hg. Healthy blood pressure for most adults is 120/80. If your blood pressure is below 90/60, you may be diagnosed with hypotension. Other information or tests that may be used to diagnose orthostatic hypotension include:  Your other vital signs, such as your heart rate and temperature.  Blood tests.  Tilt table test. For this test, you will be safely secured to a table that moves you from a lying position to an upright position. Your heart rhythm and blood pressure will be monitored during the test. How is this treated? This condition may be treated by:  Changing your diet. This may involve eating more salt (sodium) or drinking more water.  Taking medicines to raise your blood pressure.  Changing the dosage of certain medicines you are taking that might be lowering your blood pressure.  Wearing compression stockings. These stockings help to prevent blood clots and reduce swelling in your legs. In some cases, you may need to go to the hospital for:  Fluid replacement. This means you will   receive fluids through an IV.  Blood replacement. This means you will receive donated blood through an IV (transfusion).  Treating an infection or heart problems, if this applies.  Monitoring. You may need to be monitored while medicines that you are taking wear off. Follow these instructions  at home: Eating and drinking   Drink enough fluid to keep your urine pale yellow.  Eat a healthy diet, and follow instructions from your health care provider about eating or drinking restrictions. A healthy diet includes: ? Fresh fruits and vegetables. ? Whole grains. ? Lean meats. ? Low-fat dairy products.  Eat extra salt only as directed. Do not add extra salt to your diet unless your health care provider told you to do that.  Eat frequent, small meals.  Avoid standing up suddenly after eating. Medicines  Take over-the-counter and prescription medicines only as told by your health care provider. ? Follow instructions from your health care provider about changing the dosage of your current medicines, if this applies. ? Do not stop or adjust any of your medicines on your own. General instructions   Wear compression stockings as told by your health care provider.  Get up slowly from lying down or sitting positions. This gives your blood pressure a chance to adjust.  Avoid hot showers and excessive heat as directed by your health care provider.  Return to your normal activities as told by your health care provider. Ask your health care provider what activities are safe for you.  Do not use any products that contain nicotine or tobacco, such as cigarettes, e-cigarettes, and chewing tobacco. If you need help quitting, ask your health care provider.  Keep all follow-up visits as told by your health care provider. This is important. Contact a health care provider if you:  Vomit.  Have diarrhea.  Have a fever for more than 2-3 days.  Feel more thirsty than usual.  Feel weak and tired. Get help right away if you:  Have chest pain.  Have a fast or irregular heartbeat.  Develop numbness in any part of your body.  Cannot move your arms or your legs.  Have trouble speaking.  Become sweaty or feel light-headed.  Faint.  Feel short of breath.  Have trouble staying  awake.  Feel confused. Summary  Orthostatic hypotension is a sudden drop in blood pressure that happens when you quickly change positions.  Orthostatic hypotension is usually not a serious problem.  It is diagnosed by having your blood pressure taken lying down, sitting, and then standing.  It may be treated by changing your diet or adjusting your medicines. This information is not intended to replace advice given to you by your health care provider. Make sure you discuss any questions you have with your health care provider. Document Released: 09/26/2002 Document Revised: 04/01/2018 Document Reviewed: 04/01/2018 Elsevier Interactive Patient Education  2019 [employer]  Restless Legs Syndrome Restless legs syndrome is a condition that causes uncomfortable feelings or sensations in the legs, especially while sitting or lying down. The sensations usually cause an overwhelming urge to move the legs. The arms can also sometimes be affected. The condition can range from mild to severe. The symptoms often interfere with a person's ability to sleep. What are the causes? The cause of this condition is not known. What increases the risk? The following factors may make you more likely to develop this condition:  Being older than 50.  Pregnancy.  Being a woman. In general, the condition is more  common in women than in men.  A family history of the condition.  Having iron deficiency.  Overuse of caffeine, nicotine, or alcohol.  Certain medical conditions, such as kidney disease, Parkinson's disease, or nerve damage.  Certain medicines, such as those for high blood pressure, nausea, colds, allergies, depression, and some heart conditions. What are the signs or symptoms? The main symptom of this condition is uncomfortable sensations in the legs, such as:  Pulling.  Tingling.  Prickling.  Throbbing.  Crawling.  Burning. Usually, the sensations:  Affect both sides of the  body.  Are worse when you sit or lie down.  Are worse at night. These may wake you up or make it difficult to fall asleep.  Make you have a strong urge to move your legs.  Are temporarily relieved by moving your legs. The arms can also be affected, but this is rare. People who have this condition often have tiredness during the day because of their lack of sleep at night. How is this diagnosed? This condition may be diagnosed based on:  Your symptoms.  Blood tests. In some cases, you may be monitored in a sleep lab by a specialist (a sleep study). This can detect any disruptions in your sleep. How is this treated? This condition is treated by managing the symptoms. This may include:  Lifestyle changes, such as exercising, using relaxation techniques, and avoiding caffeine, alcohol, or tobacco.  Medicines. Anti-seizure medicines may be tried first. Follow these instructions at home:     General instructions  Take over-the-counter and prescription medicines only as told by your health care provider.  Use methods to help relieve the uncomfortable sensations, such as: ? Massaging your legs. ? Walking or stretching. ? Taking a cold or hot bath.  Keep all follow-up visits as told by your health care provider. This is important. Lifestyle  Practice good sleep habits. For example, go to bed and get up at the same time every day. Most adults should get 7-9 hours of sleep each night.  Exercise regularly. Try to get at least 30 minutes of exercise most days of the week.  Practice ways of relaxing, such as yoga or meditation.  Avoid caffeine and alcohol.  Do not use any products that contain nicotine or tobacco, such as cigarettes and e-cigarettes. If you need help quitting, ask your health care provider. Contact a health care provider if:  Your symptoms get worse or they do not improve with treatment. Summary  Restless legs syndrome is a condition that causes uncomfortable  feelings or sensations in the legs, especially while sitting or lying down.  The symptoms often interfere with a person's ability to sleep.  This condition is treated by managing the symptoms. You may need to make lifestyle changes or take medicines. This information is not intended to replace advice given to you by your health care provider. Make sure you discuss any questions you have with your health care provider. Document Released: 09/26/2002 Document Revised: 10/26/2017 Document Reviewed: 10/26/2017 Elsevier Interactive Patient Education  2019 [employer].

## 2018-12-17 NOTE — Progress Notes (Deleted)
w

## 2018-12-18 LAB — TSH: TSH: 1.2 mIU/L (ref 0.40–4.50)

## 2018-12-18 LAB — CBC WITH DIFFERENTIAL/PLATELET
Absolute Monocytes: 685 cells/uL (ref 200–950)
Basophils Absolute: 38 cells/uL (ref 0–200)
Basophils Relative: 0.6 %
Eosinophils Absolute: 326 cells/uL (ref 15–500)
Eosinophils Relative: 5.1 %
HCT: 29.9 % — ABNORMAL LOW (ref 35.0–45.0)
Hemoglobin: 10.2 g/dL — ABNORMAL LOW (ref 11.7–15.5)
Lymphs Abs: 1203 cells/uL (ref 850–3900)
MCH: 31.3 pg (ref 27.0–33.0)
MCHC: 34.1 g/dL (ref 32.0–36.0)
MCV: 91.7 fL (ref 80.0–100.0)
MPV: 12.2 fL (ref 7.5–12.5)
Monocytes Relative: 10.7 %
Neutro Abs: 4147 cells/uL (ref 1500–7800)
Neutrophils Relative %: 64.8 %
Platelets: 236 10*3/uL (ref 140–400)
RBC: 3.26 10*6/uL — ABNORMAL LOW (ref 3.80–5.10)
RDW: 11.6 % (ref 11.0–15.0)
Total Lymphocyte: 18.8 %
WBC: 6.4 10*3/uL (ref 3.8–10.8)

## 2018-12-18 LAB — BASIC METABOLIC PANEL
BUN/Creatinine Ratio: 22 (calc) (ref 6–22)
BUN: 12 mg/dL (ref 7–25)
CO2: 33 mmol/L — ABNORMAL HIGH (ref 20–32)
Calcium: 9.1 mg/dL (ref 8.6–10.4)
Chloride: 91 mmol/L — ABNORMAL LOW (ref 98–110)
Creat: 0.55 mg/dL — ABNORMAL LOW (ref 0.60–0.93)
Glucose, Bld: 109 mg/dL — ABNORMAL HIGH (ref 65–99)
Potassium: 4.7 mmol/L (ref 3.5–5.3)
Sodium: 132 mmol/L — ABNORMAL LOW (ref 135–146)

## 2018-12-20 ENCOUNTER — Encounter: Payer: Self-pay | Admitting: Physician Assistant

## 2018-12-20 DIAGNOSIS — D649 Anemia, unspecified: Secondary | ICD-10-CM | POA: Insufficient documentation

## 2018-12-20 DIAGNOSIS — D638 Anemia in other chronic diseases classified elsewhere: Secondary | ICD-10-CM | POA: Insufficient documentation

## 2018-12-20 MED ORDER — FERROUS SULFATE 325 (65 FE) MG PO TBEC: 325.0000 mg | DELAYED_RELEASE_TABLET | Freq: Every day | ORAL | 4 refills | Status: DC

## 2018-12-20 MED ORDER — FERROUS SULFATE 325 (65 FE) MG PO TBEC: 325.0000 mg | DELAYED_RELEASE_TABLET | Freq: Every day | ORAL | 0 refills | Status: DC

## 2018-12-20 NOTE — Progress Notes (Signed)
Call pt: you are mildly anemic with HbB 10.2. are you having any black tarry stools? This could certainly cause some fatigue. Start ferrous sulfate in morning with breakfast and NO other medications. Increase iron rich foods. Recheck in 4 weeks.   Sodium low. I know with elevated BP you have been decreasing salt. Maybe increase just a little.   Thyroid looks good.

## 2018-12-20 NOTE — Addendum Note (Signed)
Addended by: Donella Stade on: 12/20/2018 07:33 AM   Modules accepted: Orders

## 2018-12-31 ENCOUNTER — Other Ambulatory Visit: Payer: Self-pay

## 2018-12-31 ENCOUNTER — Ambulatory Visit (INDEPENDENT_AMBULATORY_CARE_PROVIDER_SITE_OTHER): Payer: Medicare Other | Admitting: Physician Assistant

## 2018-12-31 VITALS — BP 157/42 | HR 57

## 2018-12-31 DIAGNOSIS — I10 Essential (primary) hypertension: Secondary | ICD-10-CM

## 2018-12-31 NOTE — Progress Notes (Signed)
Patient ID: [name], female   DOB: Jun 27, 1946, 73 y.o.   MRN: [id number] Most BP look good. Still range low to elevated. Don't want to make any medication changes today. Will continue to monitor.

## 2018-12-31 NOTE — Progress Notes (Signed)
Pt came into clinic today for BP check. At last OV she was advised to start checking her pressure at home. She did bring log with her. BP not at goal today. Home readings checked by PCP. No Rx changes at this time. Advised her to follow up with PCP in 1 month. No further questions.   Home readings sent to scan.

## 2019-01-13 ENCOUNTER — Other Ambulatory Visit: Payer: Self-pay

## 2019-01-13 DIAGNOSIS — E890 Postprocedural hypothyroidism: Secondary | ICD-10-CM

## 2019-01-13 MED ORDER — LEVOTHYROXINE SODIUM 50 MCG PO TABS: ORAL_TABLET | ORAL | 1 refills | Status: DC

## 2019-01-20 ENCOUNTER — Telehealth: Payer: Self-pay | Admitting: Neurology

## 2019-01-20 NOTE — Telephone Encounter (Signed)
Received vm from patient stating she needs her thyroid medication refilled. Tried to call patient back at both numbers with no answer and no vm available. This was refilled on 01/13/2019, sent to [facility].

## 2019-01-21 ENCOUNTER — Ambulatory Visit (INDEPENDENT_AMBULATORY_CARE_PROVIDER_SITE_OTHER): Payer: Medicare Other | Admitting: Physician Assistant

## 2019-01-21 ENCOUNTER — Encounter: Payer: Self-pay | Admitting: Physician Assistant

## 2019-01-21 VITALS — BP 147/58 | HR 78

## 2019-01-21 DIAGNOSIS — F418 Other specified anxiety disorders: Secondary | ICD-10-CM

## 2019-01-21 DIAGNOSIS — F5101 Primary insomnia: Secondary | ICD-10-CM | POA: Diagnosis not present

## 2019-01-21 MED ORDER — QUETIAPINE FUMARATE 100 MG PO TABS: 100.0000 mg | ORAL_TABLET | Freq: Every day | ORAL | 1 refills | Status: DC

## 2019-01-21 MED ORDER — ALPRAZOLAM 1 MG PO TABS: 1.0000 mg | ORAL_TABLET | Freq: Every day | ORAL | 0 refills | Status: DC | PRN

## 2019-01-21 NOTE — Progress Notes (Signed)
Patient ID: [name], female   DOB: 01/06/1946, 73 y.o.   MRN: [id number]  .Marland KitchenVirtual Visit via Telephone Note  I connected with [name] on 01/24/19 at 10:50 AM EDT by telephone and verified that I am speaking with the correct person using two identifiers.   I discussed the limitations, risks, security and privacy concerns of performing an evaluation and management service by telephone and the availability of in person appointments. I also discussed with the patient that there may be a patient responsible charge related to this service. The patient expressed understanding and agreed to proceed.   History of Present Illness: Pt is a 73 yo female with uncontrolled HTN, hx of PE, COPD, MDD, insomnia and anxiety who needs refills.   Overall patient is doing ok today. The COVID pandemic has added some extra anxiety and stress. She has multiple people living in the house with her. This concerns her as she is high risk for being more symptomatic. She is sleeping well. She denies any SI/HC. She is taking her medications as directed with no problems or concerns. She remains on trintellix as well but does not need refills. Xanax she is using very sparingly but needs refill. Last rx was 03/2017.   She denies any worsening SOB, headache, vision changes, CP.   .. Active Ambulatory Problems    Diagnosis Date Noted  . History of lung cancer 07/28/2006  . HYPOTHYROIDISM, POSTSURGICAL 05/19/2007  . HYPERCHOLESTEROLEMIA 07/28/2006  . ANEMIA, IRON DEFICIENCY NOS 01/04/2007  . Major depressive disorder, recurrent episode ([place]) 07/28/2006  . Anxiety state 07/28/2006  . HYPERTENSION, BENIGN SYSTEMIC 07/28/2006  . DISEASE, ISCHEMIC HEART, CHRONIC NOS 05/19/2007  . PERIPHERAL VASCULAR DISEASE, UNSPEC. 07/28/2006  . COPD with chronic bronchitis 07/28/2006  . Hypoxemia 06/22/2013  . History of hip fracture 09/02/2013  . Insomnia 09/04/2013  . GERD (gastroesophageal reflux disease)  09/04/2013  . Sensorineural hearing loss of both ears 11/18/2013  . Systolic murmur 19/41/7408  . Episode of abnormal behavior 11/28/2013  . Word finding difficulty 11/28/2013  . Memory loss 11/28/2013  . Transient cerebral ischemic attack 03/01/2014  . Carotid artery narrowing 02/27/2014  . Clinical depression 02/27/2014  . Cerebrovascular accident, old 06/14/2013  . IFG (impaired fasting glucose) 04/12/2015  . Elevated blood pressure 10/16/2015  . Split S2 (second heart sound) 10/16/2015  . Anxiety as acute reaction to exceptional stress 12/05/2015  . Muscle spasm of back 12/05/2015  . Memory changes 12/05/2015  . Osteopenia 04/30/2016  . Chronic pain of left ankle 12/16/2016  . Left foot pain 02/17/2017  . Cavus foot, acquired 04/28/2017  . Depression with anxiety 08/01/2017  . Lung cancer ([place]) 08/01/2017  . Exertional chest pain 01/26/2018  . Pulmonary embolism ([place]) 01/29/2018  . Venous stasis of lower extremity 02/25/2018  . Primary osteoarthritis of both hands 02/25/2018  . Chronic respiratory failure ([place]) 04/02/2018  . Breast asymmetry 06/21/2018  . Headache, new daily persistent ([facility]) 07/28/2018  . Uncontrolled stage 2 hypertension 07/30/2018  . Anemia 12/20/2018   Resolved Ambulatory Problems    Diagnosis Date Noted  . THRUSH 09/26/2010  . Abdominal bloating 03/04/2011  .  Chronic Bronchopleural fistula    Past Medical History:  Diagnosis Date  . CAD (coronary artery disease)   . Cancer ([place]) 2007  . COPD (chronic obstructive pulmonary disease) ([place]) 9/10  . Hurthle cell adenoma of thyroid 2007  . Hypertension   . Iron deficiency anemia 2/08  . Peripheral vascular disease ([place])   .  RLS (restless legs syndrome)    Reviewed med, allergy, problem list.    Observations/Objective: No acute distress  . Today's Vitals   01/21/19 1100  BP: (!) 147/58  Pulse: 78   There is no height or weight on file to calculate BMI.   Assessment and  Plan: Marland KitchenMarland KitchenDiagnoses and all orders for this visit:  Primary insomnia  Depression with anxiety -     QUEtiapine (SEROQUEL) 100 MG tablet; Take 1 tablet (100 mg total) by mouth at bedtime. -     Discontinue: ALPRAZolam (XANAX) 1 MG tablet; Take 1 tablet (1 mg total) by mouth daily as needed for anxiety. -     ALPRAZolam (XANAX) 1 MG tablet; Take 1 tablet (1 mg total) by mouth daily as needed for anxiety.   Refills sent to mail order company.  BP looks good.   Last xanax refill was 03/2017. Using very sparingly. Marland KitchenPDMP reviewed during this encounter.     Follow Up Instructions:    I discussed the assessment and treatment plan with the patient. The patient was provided an opportunity to ask questions and all were answered. The patient agreed with the plan and demonstrated an understanding of the instructions.   The patient was advised to call back or seek an in-person evaluation if the symptoms worsen or if the condition fails to improve as anticipated.  I provided 10 minutes of non-face-to-face time during this encounter.   Iran Planas, PA-C

## 2019-01-24 ENCOUNTER — Encounter: Payer: Self-pay | Admitting: Physician Assistant

## 2019-01-31 ENCOUNTER — Ambulatory Visit: Payer: Medicare Other | Admitting: Physician Assistant

## 2019-02-28 ENCOUNTER — Telehealth: Payer: Self-pay | Admitting: Neurology

## 2019-02-28 NOTE — Telephone Encounter (Signed)
Patient left vm stating she wanted to discuss a letter she received about her oxygen. [phone].

## 2019-03-01 NOTE — Telephone Encounter (Signed)
Spoke with patient. She dropped a form off yesterday for completion at our office. She has to have a visit with provider from dates 01/14/2019 - 04/13/2019. Patient was seen, but not to discuss oxygen. I will complete form and see if anything else is needed. Patient aware she may need another visit just to discuss oxygen use - which she uses all day long. Will call back if needed.

## 2019-03-01 NOTE — Telephone Encounter (Signed)
Left message on machine for patient to call back.

## 2019-03-01 NOTE — Telephone Encounter (Signed)
Form completed and faxed to [place] at [phone] with confirmation received.

## 2019-04-19 ENCOUNTER — Ambulatory Visit (INDEPENDENT_AMBULATORY_CARE_PROVIDER_SITE_OTHER): Payer: Medicare Other | Admitting: *Deleted

## 2019-04-19 ENCOUNTER — Other Ambulatory Visit: Payer: Self-pay | Admitting: Physician Assistant

## 2019-04-19 VITALS — BP 191/71 | HR 60 | Ht 63.0 in | Wt 119.0 lb

## 2019-04-19 DIAGNOSIS — R627 Adult failure to thrive: Secondary | ICD-10-CM

## 2019-04-19 DIAGNOSIS — F330 Major depressive disorder, recurrent, mild: Secondary | ICD-10-CM

## 2019-04-19 DIAGNOSIS — F418 Other specified anxiety disorders: Secondary | ICD-10-CM

## 2019-04-19 DIAGNOSIS — Z0000 Encounter for general adult medical examination without abnormal findings: Secondary | ICD-10-CM

## 2019-04-19 DIAGNOSIS — R634 Abnormal weight loss: Secondary | ICD-10-CM

## 2019-04-19 DIAGNOSIS — R6251 Failure to thrive (child): Secondary | ICD-10-CM

## 2019-04-19 DIAGNOSIS — D649 Anemia, unspecified: Secondary | ICD-10-CM

## 2019-04-19 NOTE — Patient Instructions (Addendum)
[name] , Thank you for taking time to come for your Medicare Wellness Visit. I appreciate your ongoing commitment to your health goals. Please review the following plan we discussed and let me know if I can assist you in the future. Please schedule your next medicare wellness visit with me in 1 yr. These are the goals we discussed: Goals    . DIET - INCREASE LEAN PROTEINS     Increase protein in your diet. If can;t eat meat try the protein shakes.        Please call patient to discuss her BP. It has been elevated a lot. Some reading s are 176/71 58 pulse 153/59 59 pulse 183/73 59 pulse. Taking all BP meds as well as the patch. Please advise her

## 2019-04-19 NOTE — Progress Notes (Addendum)
Subjective:   [name] is a 73 y.o. female who presents for Medicare Annual (Subsequent) preventive examination.  Review of Systems:  No ROS.  Medicare Wellness Virtual Visit.  Visual/audio telehealth visit, UTA vital signs.   See social history for additional risk factors.    Cardiac Risk Factors include: sedentary lifestyle;advanced age (>39men, >21 women);hypertension Sleep patterns: Getting 8-9 hours of sleep a night. Wakes up at least 3-4 times a night to void. When wakes up feels sluggish.  Home Safety/Smoke Alarms: Feels safe in home. Smoke alarms in place.  Living environment Lives with husband and daughter in a 2 story home, stairs have hand rails on them. Shower is a step over tub/shower combo with grab bars in place also a bench is there.Seat Belt Safety/Bike Helmet: Wears seat belt.   Female:   Pap-  Aged out     Mammo- UTD      Dexa scan-  UTD      CCS- UTD     Objective:     Vitals: There were no vitals taken for this visit.  There is no height or weight on file to calculate BMI.  Advanced Directives 04/19/2019  Does Patient Have a Medical Advance Directive? No  Would patient like information on creating a medical advance directive? No - Patient declined    Tobacco Social History   Tobacco Use  Smoking Status Former Smoker  . Packs/day: 1.50  . Years: 30.00  . Pack years: 45.00  . Types: Cigarettes  . Quit date: 10/20/2004  . Years since quitting: 14.5  Smokeless Tobacco Never Used     Counseling given: Not Answered   Clinical Intake:  Pre-visit preparation completed: Yes  Pain : No/denies pain     Nutritional Risks: None Diabetes: No  How often do you need to have someone help you when you read instructions, pamphlets, or other written materials from your doctor or pharmacy?: 1 - Never What is the last grade level you completed in school?: 11  Interpreter Needed?: No  Information entered by :: Orlie Dakin, LPN  Past Medical  History:  Diagnosis Date  . Bronchopleural fistula ([place])   . CAD (coronary artery disease)    20% stenosis on cath - no intervention required (W-S cards), negative nuclear stress test 11/07  . Cancer [facility]) 2007   lung (Dr. Earlie Server and Dr. Arlyce Dice)  . COPD (chronic obstructive pulmonary disease) ([place]) 9/10   golds stage III, FeV1 39%  . Hurthle cell adenoma of thyroid 2007  . Hypertension   . Iron deficiency anemia 2/08   s/p 2 unit transfusion  . Lung cancer ([place])   . Peripheral vascular disease ([place])   . RLS (restless legs syndrome)    Past Surgical History:  Procedure Laterality Date  . APPENDECTOMY  18  . CHOLECYSTECTOMY  18  . heart cartherization    . LUL  2005   LUL wedge resection/VATS  . LUL  4/08   LUL lobectomy for cystic cavity and Candida, no cancer seen  . THYROIDECTOMY, PARTIAL  2007   Dr. Arlyce Dice   Family History  Problem Relation Age of Onset  . Cancer Mother        Larynx & Endometrial cancer  . Cancer Father        laryngeal cancer  . Hypertension Father   . Diabetes Sister   . Multiple sclerosis Sister   . Cancer Sister 58       breast   Social  History   Socioeconomic History  . Marital status: Married    Spouse name: Juanda Crumble  . Number of children: 4  . Years of education: 28  . Highest education level: 11th grade  Occupational History  . Occupation: [employer]    Comment: retired  Scientific laboratory technician  . Financial resource strain: Not hard at all  . Food insecurity    Worry: Never true    Inability: Never true  . Transportation needs    Medical: No    Non-medical: No  Tobacco Use  . Smoking status: Former Smoker    Packs/day: 1.50    Years: 30.00    Pack years: 45.00    Types: Cigarettes    Quit date: 10/20/2004    Years since quitting: 14.5  . Smokeless tobacco: Never Used  Substance and Sexual Activity  . Alcohol use: No  . Drug use: No  . Sexual activity: Not Currently  Lifestyle  . Physical activity    Days per week: 7  days    Minutes per session: 20 min  . Stress: Not at all  Relationships  . Social connections    Talks on phone: More than three times a week    Gets together: Once a week    Attends religious service: Never    Active member of club or organization: No    Attends meetings of clubs or organizations: Never    Relationship status: Married  Other Topics Concern  . Not on file  Social History Narrative   Watch Tv   Works on the [employer]   Drinks 2 cups of coffee daily    Outpatient Encounter Medications as of 04/19/2019  Medication Sig  . albuterol (PROVENTIL HFA;VENTOLIN HFA) 108 (90 Base) MCG/ACT inhaler Inhale 2 puffs into the lungs every 6 (six) hours as needed for wheezing or shortness of breath.  Marland Kitchen albuterol (PROVENTIL) (2.5 MG/3ML) 0.083% nebulizer solution Take 3 mLs (2.5 mg total) by nebulization every 6 (six) hours as needed.  . ALPRAZolam (XANAX) 1 MG tablet Take 1 tablet (1 mg total) by mouth daily as needed for anxiety.  . AMBULATORY NON FORMULARY MEDICATION Hypoxia with positive walk test. O2 2L as needed for SOB and decreased O2 stats with exertion. Pt request portable O2 tanks only.  Marland Kitchen aspirin EC 81 MG tablet Take 81 mg by mouth daily.  Marland Kitchen atorvastatin (LIPITOR) 20 MG tablet Take 1 tablet (20 mg total) by mouth at bedtime. take 1 tablet by mouth once daily  . cloNIDine (CATAPRES - DOSED IN MG/24 HR) 0.2 mg/24hr patch Place 1 patch (0.2 mg total) onto the skin once a week.  . Fluticasone-Umeclidin-Vilant (TRELEGY ELLIPTA) 100-62.5-25 MCG/INH AEPB Inhale 1 puff into the lungs daily.  Marland Kitchen labetalol (NORMODYNE) 200 MG tablet Take 1 tablet (200 mg total) by mouth 2 (two) times daily. (Patient taking differently: Take 400 mg by mouth 2 (two) times daily. )  . levothyroxine (SYNTHROID, LEVOTHROID) 50 MCG tablet take 1 tablet by mouth once daily  . QUEtiapine (SEROQUEL) 100 MG tablet Take 1 tablet (100 mg total) by mouth at bedtime.  . valsartan (DIOVAN) 320 MG tablet  Take 1 tablet (320 mg total) by mouth daily.  Marland Kitchen vortioxetine HBr (TRINTELLIX) 20 MG TABS tablet Take 1 tablet (20 mg total) by mouth daily.  . butalbital-acetaminophen-caffeine (FIORICET, ESGIC) 50-325-40 MG tablet TAKE 1 TO 2 TABLETS BY MOUTH EVERY 6 HOURS AS NEEDED FOR HEADACHE (Patient not taking: Reported on 04/19/2019)  .  ferrous sulfate 325 (65 FE) MG EC tablet Take 1 tablet (325 mg total) by mouth daily with breakfast. (Patient not taking: Reported on 04/19/2019)  . ipratropium-albuterol (DUONEB) 0.5-2.5 (3) MG/3ML SOLN Take 3 mLs by nebulization every 4 (four) hours as needed. (Patient not taking: Reported on 04/19/2019)  . ondansetron (ZOFRAN-ODT) 8 MG disintegrating tablet Take 1 tablet (8 mg total) by mouth every 8 (eight) hours as needed for nausea or vomiting. (Patient not taking: Reported on 04/19/2019)  . spironolactone (ALDACTONE) 25 MG tablet Take 1 tablet (25 mg total) by mouth daily.   No facility-administered encounter medications on file as of 04/19/2019.     Activities of Daily Living In your present state of health, do you have any difficulty performing the following activities: 04/19/2019 08/18/2018  Hearing? N N  Vision? N N  Difficulty concentrating or making decisions? Y N  Comment difficulty concentrating- takes med for it. -  Walking or climbing stairs? N N  Dressing or bathing? N N  Doing errands, shopping? N N  Preparing Food and eating ? N -  Using the Toilet? N -  In the past six months, have you accidently leaked urine? Y -  Comment wears a panti liner -  Do you have problems with loss of bowel control? N -  Managing your Medications? N -  Managing your Finances? N -  Housekeeping or managing your Housekeeping? N -  Some recent data might be hidden    Patient Care Team: Lavada Mesi as PCP - General (Family Medicine) Elsie Stain, MD (Pulmonary Disease)    Assessment:   This is a routine wellness examination for [place].Physical assessment  deferred to PCP.   Exercise Activities and Dietary recommendations Current Exercise Habits: Home exercise routine, Type of exercise: stretching, Time (Minutes): 20, Frequency (Times/Week): 7, Weekly Exercise (Minutes/Week): 140, Intensity: Mild, Exercise limited by: respiratory conditions(s) Diet Eats a healthy diet- vegetables and does not eat meat that much. Breakfast:peanut butter and jelly sandwich or oatmeal Lunch: skips. Dinner: Vegetables mostly. Patient states that husband is pushing her to eat meat to get protein in her diet. Advised patient needs to drink the protein shakes if not going to eat meat. Drinks water daily all day     Goals    . DIET - INCREASE LEAN PROTEINS     Increase protein in your diet. If can;t eat meat try the protein shakes.        Fall Risk Fall Risk  04/19/2019 09/24/2017 04/14/2016 03/15/2015  Falls in the past year? 0 No No No  Comment - [place] Telephone Survey: data to providers prior to load - -  Number falls in past yr: 0 - - -  Injury with Fall? 0 - - -  Follow up Falls prevention discussed - - -   Is the patient's home free of loose throw rugs in walkways, pet beds, electrical cords, etc?   yes      Grab bars in the bathroom? yes      Handrails on the stairs?   yes      Adequate lighting?   yes  Depression Screen PHQ 2/9 Scores 04/19/2019 12/17/2018 09/13/2018 07/15/2018  PHQ - 2 Score 1 2 3 1   PHQ- 9 Score - 8 12 8      Cognitive Function MMSE - Mini Mental State Exam 12/17/2018 07/29/2017  Orientation to time 5 5  Orientation to Place 5 5  Registration 3 3  Attention/ Calculation 1 5  Recall 2 1  Language- name 2 objects 2 2  Language- repeat 1 1  Language- follow 3 step command 3 3  Language- read & follow direction 1 1  Write a sentence 1 1  Copy design 1 1  Total score 25 28     6CIT Screen 04/19/2019  What Year? 0 points  What month? 0 points  What time? 0 points  Count back from 20 0 points  Months in reverse 2 points   Repeat phrase 4 points  Total Score 6    Immunization History  Administered Date(s) Administered  . H1N1 08/21/2008  . Influenza Split 09/02/2011, 09/07/2012  . Influenza Whole 08/25/2006, 08/19/2007, 08/21/2008, 07/23/2009, 07/31/2010  . Influenza, High Dose Seasonal PF 07/30/2016, 06/18/2018  . Influenza,inj,Quad PF,6+ Mos 07/20/2013, 08/06/2015, 07/29/2017  . Influenza-Unspecified 08/21/2008  . Pneumococcal Conjugate-13 05/11/2014  . Pneumococcal Polysaccharide-23 08/25/2006, 11/29/2012  . Tdap 03/04/2011  . Zoster 12/25/2011    Screening Tests Health Maintenance  Topic Date Due  . INFLUENZA VACCINE  05/21/2019  . COLONOSCOPY  08/23/2019  . MAMMOGRAM  09/22/2020  . TETANUS/TDAP  03/03/2021  . DEXA SCAN  Completed  . Hepatitis C Screening  Completed  . PNA vac Low Risk Adult  Completed       Plan:    Ms. Balentine , Thank you for taking time to come for your Medicare Wellness Visit. I appreciate your ongoing commitment to your health goals. Please review the following plan we discussed and let me know if I can assist you in the future. Please schedule your next medicare wellness visit with me in 1 yr.    These are the goals we discussed: Goals    . DIET - INCREASE LEAN PROTEINS     Increase protein in your diet. If can;t eat meat try the protein shakes.        This is a list of the screening recommended for you and due dates:  Health Maintenance  Topic Date Due  . Flu Shot  05/21/2019  . Colon Cancer Screening  08/23/2019  . Mammogram  09/22/2020  . Tetanus Vaccine  03/03/2021  . DEXA scan (bone density measurement)  Completed  .  Hepatitis C: One time screening is recommended by Center for Disease Control  (CDC) for  adults born from [birth year] through 1964.   Completed  . Pneumonia vaccines  Completed     I have personally reviewed and noted the following in the patient's chart:   . Medical and social history . Use of alcohol, tobacco or illicit drugs   . Current medications and supplements . Functional ability and status . Nutritional status . Physical activity . Advanced directives . List of other physicians . Hospitalizations, surgeries, and ER visits in previous 12 months . Vitals . Screenings to include cognitive, depression, and falls . Referrals and appointments  In addition, I have reviewed and discussed with patient certain preventive protocols, quality metrics, and best practice recommendations. A written personalized care plan for preventive services as well as general preventive health recommendations were provided to patient.     Joanne Chars, LPN  [id number]/[id number]   .Marland KitchenMedical screening examination/treatment was performed by qualified clinical staff member and as supervising physician I was immediately available for consultation/collaboration. I have reviewed documentation and agree with assessment and plan.  Iran Planas, PA-C

## 2019-04-19 NOTE — Telephone Encounter (Signed)
Patient made aware of information from message above.   1) Does not currently have an appt with Cardiology, but has called them to schedule one. Awaiting call back. She is trying to get off fluid pills, thinks it is making her feel bad/contributing to weight loss.   2. She will keep track of blood pressures through day and call us back. I will check back with her in a week if I haven't heard from her.   3. She will add protein shakes. She is agreeable to labs to look into weight loss. Feeling tired all the time.

## 2019-04-19 NOTE — Telephone Encounter (Signed)
Call patient:  I saw her blood pressure readings a wellness visit today.  Very elevated. When is next cardiology appt.  Can you check throughout the day and see if staying that high. I know in the past you have flucuated.   Your weight is dropping too. Drink protien shakes.   How do you feel? We could work up the weight loss?

## 2019-04-20 NOTE — Telephone Encounter (Signed)
Ok to order TSH, CBC, Ferritin, CMP to do a quick check on anything causing you to lose weight.

## 2019-04-20 NOTE — Telephone Encounter (Signed)
Labs ordered. Patient will get drawn.

## 2019-04-26 MED ORDER — ATORVASTATIN CALCIUM 20 MG PO TABS: 20.0000 mg | ORAL_TABLET | Freq: Every day | ORAL | 0 refills | Status: DC

## 2019-04-26 MED ORDER — VORTIOXETINE HBR 20 MG PO TABS: 20.0000 mg | ORAL_TABLET | Freq: Every day | ORAL | 0 refills | Status: DC

## 2019-04-26 NOTE — Addendum Note (Signed)
Addended byLoma Boston, JADE L on: 04/26/2019 04:00 PM   Modules accepted: Orders

## 2019-04-26 NOTE — Telephone Encounter (Signed)
Called patient for BP readings  Today around 11:30 am - BP 147/61 HR 62 04/25/2019 early BP 123/51 HR 65 04/24/2019 early BP 152/62 HR 63 04/23/2019 evening BP 174/69 HR 59 04/22/2019 evening BP 197/77 HR 61  She will get labs Thursday.   She is going to need refills of Atorvastatin, Trintellix, and Xanax. Atorvastatin and Trintellix sent. Please advise on Xanax.

## 2019-04-28 DIAGNOSIS — R627 Adult failure to thrive: Secondary | ICD-10-CM | POA: Diagnosis not present

## 2019-04-28 DIAGNOSIS — D649 Anemia, unspecified: Secondary | ICD-10-CM | POA: Diagnosis not present

## 2019-04-28 DIAGNOSIS — R634 Abnormal weight loss: Secondary | ICD-10-CM | POA: Diagnosis not present

## 2019-04-29 LAB — CBC WITH DIFFERENTIAL/PLATELET
Absolute Monocytes: 599 cells/uL (ref 200–950)
Basophils Absolute: 32 cells/uL (ref 0–200)
Basophils Relative: 0.6 %
Eosinophils Absolute: 297 cells/uL (ref 15–500)
Eosinophils Relative: 5.5 %
HCT: 29.2 % — ABNORMAL LOW (ref 35.0–45.0)
Hemoglobin: 9.5 g/dL — ABNORMAL LOW (ref 11.7–15.5)
Lymphs Abs: 821 cells/uL — ABNORMAL LOW (ref 850–3900)
MCH: 30.8 pg (ref 27.0–33.0)
MCHC: 32.5 g/dL (ref 32.0–36.0)
MCV: 94.8 fL (ref 80.0–100.0)
MPV: 12 fL (ref 7.5–12.5)
Monocytes Relative: 11.1 %
Neutro Abs: 3650 cells/uL (ref 1500–7800)
Neutrophils Relative %: 67.6 %
Platelets: 226 10*3/uL (ref 140–400)
RBC: 3.08 10*6/uL — ABNORMAL LOW (ref 3.80–5.10)
RDW: 11.8 % (ref 11.0–15.0)
Total Lymphocyte: 15.2 %
WBC: 5.4 10*3/uL (ref 3.8–10.8)

## 2019-04-29 LAB — COMPLETE METABOLIC PANEL WITH GFR
AG Ratio: 1.9 (calc) (ref 1.0–2.5)
ALT: 12 U/L (ref 6–29)
AST: 14 U/L (ref 10–35)
Albumin: 4 g/dL (ref 3.6–5.1)
Alkaline phosphatase (APISO): 75 U/L (ref 37–153)
BUN/Creatinine Ratio: 23 (calc) — ABNORMAL HIGH (ref 6–22)
BUN: 12 mg/dL (ref 7–25)
CO2: 35 mmol/L — ABNORMAL HIGH (ref 20–32)
Calcium: 9.1 mg/dL (ref 8.6–10.4)
Chloride: 93 mmol/L — ABNORMAL LOW (ref 98–110)
Creat: 0.52 mg/dL — ABNORMAL LOW (ref 0.60–0.93)
GFR, Est African American: 111 mL/min/{1.73_m2} (ref >=60)
GFR, Est Non African American: 95 mL/min/{1.73_m2} (ref >=60)
Globulin: 2.1 g/dL (calc) (ref 1.9–3.7)
Glucose, Bld: 120 mg/dL — ABNORMAL HIGH (ref 65–99)
Potassium: 4.3 mmol/L (ref 3.5–5.3)
Sodium: 133 mmol/L — ABNORMAL LOW (ref 135–146)
Total Bilirubin: 0.5 mg/dL (ref 0.2–1.2)
Total Protein: 6.1 g/dL (ref 6.1–8.1)

## 2019-04-29 LAB — TSH: TSH: 3.3 mIU/L (ref 0.40–4.50)

## 2019-04-29 LAB — FERRITIN: Ferritin: 56 ng/mL (ref 16–288)

## 2019-05-02 ENCOUNTER — Telehealth: Payer: Self-pay | Admitting: Neurology

## 2019-05-02 MED ORDER — ALPRAZOLAM 1 MG PO TABS: 1.0000 mg | ORAL_TABLET | Freq: Every day | ORAL | 0 refills | Status: DC | PRN

## 2019-05-02 NOTE — Telephone Encounter (Signed)
Patient left vm wanting to discuss results. [phone].

## 2019-05-02 NOTE — Telephone Encounter (Signed)
Patient scheduled with Luvenia Starch to follow up labs.

## 2019-05-03 ENCOUNTER — Other Ambulatory Visit: Payer: Self-pay

## 2019-05-03 ENCOUNTER — Ambulatory Visit (INDEPENDENT_AMBULATORY_CARE_PROVIDER_SITE_OTHER): Payer: Medicare Other | Admitting: Physician Assistant

## 2019-05-03 ENCOUNTER — Other Ambulatory Visit: Payer: Self-pay | Admitting: Physician Assistant

## 2019-05-03 ENCOUNTER — Encounter: Payer: Self-pay | Admitting: Physician Assistant

## 2019-05-03 VITALS — BP 178/84 | HR 70 | Temp 98.7°F | Ht 63.0 in | Wt 120.0 lb

## 2019-05-03 DIAGNOSIS — E871 Hypo-osmolality and hyponatremia: Secondary | ICD-10-CM

## 2019-05-03 DIAGNOSIS — I10 Essential (primary) hypertension: Secondary | ICD-10-CM | POA: Diagnosis not present

## 2019-05-03 DIAGNOSIS — R1013 Epigastric pain: Secondary | ICD-10-CM | POA: Diagnosis not present

## 2019-05-03 DIAGNOSIS — D649 Anemia, unspecified: Secondary | ICD-10-CM

## 2019-05-03 DIAGNOSIS — K219 Gastro-esophageal reflux disease without esophagitis: Secondary | ICD-10-CM

## 2019-05-03 MED ORDER — OMEPRAZOLE 40 MG PO CPDR: 40.0000 mg | DELAYED_RELEASE_CAPSULE | Freq: Every day | ORAL | 3 refills | Status: DC

## 2019-05-03 MED ORDER — SUCRALFATE 1 G PO TABS: 1.0000 g | ORAL_TABLET | Freq: Three times a day (TID) | ORAL | 0 refills | Status: DC

## 2019-05-03 MED ORDER — CLONIDINE HCL 0.3 MG/24HR TD PTWK: 0.3000 mg | MEDICATED_PATCH | TRANSDERMAL | 12 refills | Status: DC

## 2019-05-03 MED ORDER — LABETALOL HCL 200 MG PO TABS: 400.0000 mg | ORAL_TABLET | Freq: Two times a day (BID) | ORAL | 5 refills | Status: DC

## 2019-05-03 NOTE — Patient Instructions (Signed)
Increase clonadine patch to .3mg  Start omeprazole in morning.  Use carafate before meals.  Get new labs.

## 2019-05-03 NOTE — Progress Notes (Signed)
Subjective:    Patient ID: [name], female    DOB: [birth year], 73 y.o.   MRN: [id number]  HPI  Pt is a 73 yo female with uncontrolled variable HTN, COPD with chronic hypoxia, hx of lung cancer, MDD who presents to the clinic for weight loss, variable HTN, and to evaluate anemia.   Pt has a cardiologist, Dr. Stanford Breed. We have been working together to get BP more controlled. Per patient she has readings at home over 765 systolic and as low as 465. She is very complaint with medication. She does get a symptomatic headache with elevated blood pressure.   She just has no appetite. She eats maybe one meal a day. If she makes herself eat too much she will vomit it back up. This has been going on for the last 2-3 months.   Her labs showed anemia. She admits to upper abdominal discomfort a lot of the time.she also has some reflux. She takes OTC acid reducers. She has not noticed any melena or hematochezia.    Marland Kitchen. Active Ambulatory Problems    Diagnosis Date Noted  . History of lung cancer 07/28/2006  . HYPOTHYROIDISM, POSTSURGICAL 05/19/2007  . HYPERCHOLESTEROLEMIA 07/28/2006  . ANEMIA, IRON DEFICIENCY NOS 01/04/2007  . Major depressive disorder, recurrent episode ([place]) 07/28/2006  . Anxiety state 07/28/2006  . HYPERTENSION, BENIGN SYSTEMIC 07/28/2006  . DISEASE, ISCHEMIC HEART, CHRONIC NOS 05/19/2007  . PERIPHERAL VASCULAR DISEASE, UNSPEC. 07/28/2006  . COPD with chronic bronchitis 07/28/2006  . Hypoxemia 06/22/2013  . History of hip fracture 09/02/2013  . Insomnia 09/04/2013  . GERD (gastroesophageal reflux disease) 09/04/2013  . Sensorineural hearing loss of both ears 11/18/2013  . Systolic murmur 03/54/6568  . Episode of abnormal behavior 11/28/2013  . Word finding difficulty 11/28/2013  . Memory loss 11/28/2013  . Transient cerebral ischemic attack 03/01/2014  . Carotid artery narrowing 02/27/2014  . Clinical depression 02/27/2014  . Cerebrovascular accident, old  06/14/2013  . IFG (impaired fasting glucose) 04/12/2015  . Elevated blood pressure 10/16/2015  . Split S2 (second heart sound) 10/16/2015  . Anxiety as acute reaction to exceptional stress 12/05/2015  . Muscle spasm of back 12/05/2015  . Memory changes 12/05/2015  . Osteopenia 04/30/2016  . Chronic pain of left ankle 12/16/2016  . Left foot pain 02/17/2017  . Cavus foot, acquired 04/28/2017  . Depression with anxiety 08/01/2017  . Lung cancer ([place]) 08/01/2017  . Exertional chest pain 01/26/2018  . Pulmonary embolism ([place]) 01/29/2018  . Venous stasis of lower extremity 02/25/2018  . Primary osteoarthritis of both hands 02/25/2018  . Chronic respiratory failure ([place]) 04/02/2018  . Breast asymmetry 06/21/2018  . Headache, new daily persistent ([facility]) 07/28/2018  . Uncontrolled stage 2 hypertension 07/30/2018  . Anemia 12/20/2018  . Hyponatremia 05/04/2019  . Epigastric pain 05/04/2019   Resolved Ambulatory Problems    Diagnosis Date Noted  . THRUSH 09/26/2010  . Abdominal bloating 03/04/2011  .  Chronic Bronchopleural fistula    Past Medical History:  Diagnosis Date  . CAD (coronary artery disease)   . Cancer ([place]) 2007  . COPD (chronic obstructive pulmonary disease) ([place]) 9/10  . Hurthle cell adenoma of thyroid 2007  . Hypertension   . Iron deficiency anemia 2/08  . Peripheral vascular disease ([place])   . RLS (restless legs syndrome)        Review of Systems See HPI.     Objective:   Physical Exam Vitals signs reviewed.  Constitutional:  Appearance: Normal appearance.  HENT:     Head: Normocephalic.  Cardiovascular:     Rate and Rhythm: Normal rate and regular rhythm.     Pulses: Normal pulses.  Pulmonary:     Effort: Pulmonary effort is normal.     Comments: On 2L on O2.  Abdominal:     General: Bowel sounds are normal. There is no distension.     Palpations: Abdomen is soft.     Tenderness: There is abdominal tenderness. There is no guarding or  rebound.     Comments: epigastric tenderness to palpation.   Genitourinary:    Rectum: Guaiac result negative.  Neurological:     General: No focal deficit present.     Mental Status: She is alert and oriented to person, place, and time.  Psychiatric:        Mood and Affect: Mood normal.           Assessment & Plan:  Marland KitchenMarland KitchenBobbette was seen today for advice only.  Diagnoses and all orders for this visit:  Anemia, unspecified type -     Fe+TIBC+Fer -     Retic -     IBC panel -     CBC w/Diff/Platelet  Uncontrolled stage 2 hypertension -     labetalol (NORMODYNE) 200 MG tablet; Take 2 tablets (400 mg total) by mouth 2 (two) times daily. -     cloNIDine (CATAPRES - DOSED IN MG/24 HR) 0.3 mg/24hr patch; Place 1 patch (0.3 mg total) onto the skin once a week.  Essential hypertension  Hyponatremia -     CBC w/Diff/Platelet -     Basic metabolic panel  Epigastric pain -     omeprazole (PRILOSEC) 40 MG capsule; Take 1 capsule (40 mg total) by mouth daily.  Gastroesophageal reflux disease without esophagitis -     omeprazole (PRILOSEC) 40 MG capsule; Take 1 capsule (40 mg total) by mouth daily.  Other orders -     Discontinue: sucralfate (CARAFATE) 1 g tablet; Take 1 tablet (1 g total) by mouth 3 (three) times daily with meals.   5 days ago labs drawn. She was hyponatremic and showed some sigs of dehydration. She has been pushing fluids. Will recheck BMP. Encouraged meal replacement at least 2 meals a day.   Concern for anemia worsening. hemoccult today was negative for blood. Very concerned she is losing blood from somewhere. Last colonoscopy 08/2009. With worsening epigastric pain concerned for GI blood loss causing anemia. Pt is on iron supplement. Will recheck hemoglobin along with other labs today. If continuing to drop will refer to GI for further work up. Will start omeprazole and carafate to see if gives symptom relief. This could be causing lack of appetite and thus weight  loss.   BP continues to be very variable. I will send note to cardiologist to get his thoughts. In the meantime will increase clonidine to .3mg  patch twice a week. Continue on same therapy.   Marland Kitchen.Spent 30 minutes with patient and greater than 50 percent of visit spent counseling patient regarding treatment plan.

## 2019-05-04 ENCOUNTER — Encounter: Payer: Self-pay | Admitting: Physician Assistant

## 2019-05-04 DIAGNOSIS — R1013 Epigastric pain: Secondary | ICD-10-CM | POA: Insufficient documentation

## 2019-05-04 DIAGNOSIS — E871 Hypo-osmolality and hyponatremia: Secondary | ICD-10-CM | POA: Insufficient documentation

## 2019-05-04 DIAGNOSIS — D649 Anemia, unspecified: Secondary | ICD-10-CM

## 2019-05-04 LAB — CBC WITH DIFFERENTIAL/PLATELET
Absolute Monocytes: 730 cells/uL (ref 200–950)
Basophils Absolute: 20 cells/uL (ref 0–200)
Basophils Relative: 0.3 %
Eosinophils Absolute: 248 cells/uL (ref 15–500)
Eosinophils Relative: 3.7 %
HCT: 28.5 % — ABNORMAL LOW (ref 35.0–45.0)
Hemoglobin: 9.2 g/dL — ABNORMAL LOW (ref 11.7–15.5)
Lymphs Abs: 1005 cells/uL (ref 850–3900)
MCH: 30 pg (ref 27.0–33.0)
MCHC: 32.3 g/dL (ref 32.0–36.0)
MCV: 92.8 fL (ref 80.0–100.0)
MPV: 11.8 fL (ref 7.5–12.5)
Monocytes Relative: 10.9 %
Neutro Abs: 4697 cells/uL (ref 1500–7800)
Neutrophils Relative %: 70.1 %
Platelets: 257 10*3/uL (ref 140–400)
RBC: 3.07 10*6/uL — ABNORMAL LOW (ref 3.80–5.10)
RDW: 11.7 % (ref 11.0–15.0)
Total Lymphocyte: 15 %
WBC: 6.7 10*3/uL (ref 3.8–10.8)

## 2019-05-04 LAB — BASIC METABOLIC PANEL
BUN/Creatinine Ratio: 35 (calc) — ABNORMAL HIGH (ref 6–22)
BUN: 17 mg/dL (ref 7–25)
CO2: 37 mmol/L — ABNORMAL HIGH (ref 20–32)
Calcium: 9.1 mg/dL (ref 8.6–10.4)
Chloride: 93 mmol/L — ABNORMAL LOW (ref 98–110)
Creat: 0.49 mg/dL — ABNORMAL LOW (ref 0.60–0.93)
Glucose, Bld: 108 mg/dL — ABNORMAL HIGH (ref 65–99)
Potassium: 4.2 mmol/L (ref 3.5–5.3)
Sodium: 135 mmol/L (ref 135–146)

## 2019-05-04 LAB — IRON,TIBC AND FERRITIN PANEL
%SAT: 17 % (calc) (ref 16–45)
Ferritin: 43 ng/mL (ref 16–288)
Iron: 49 ug/dL (ref 45–160)
TIBC: 291 mcg/dL (calc) (ref 250–450)

## 2019-05-04 LAB — RETICULOCYTES
ABS Retic: 30300 cells/uL (ref 20000–8000)
Retic Ct Pct: 1 %

## 2019-05-04 NOTE — Progress Notes (Signed)
Hemoglobin continue to drop from 9.5 to 9.2 in 5 days. Although hemoocult in office was negative for blood my concern is that you could be losing blood from somewhere internally and since your upper abdominal is tender and you are having trouble eating it could be your stomach. You need to see a GI doc to have more test. I am going to refer you. Would you like to stay in [place]?   Sodium is back in normal range. Good news. Looks like increasing your hydration is paying off.

## 2019-05-23 DIAGNOSIS — K5900 Constipation, unspecified: Secondary | ICD-10-CM | POA: Diagnosis not present

## 2019-05-23 DIAGNOSIS — D649 Anemia, unspecified: Secondary | ICD-10-CM | POA: Diagnosis not present

## 2019-05-23 DIAGNOSIS — R1013 Epigastric pain: Secondary | ICD-10-CM | POA: Diagnosis not present

## 2019-05-23 DIAGNOSIS — R630 Anorexia: Secondary | ICD-10-CM | POA: Diagnosis not present

## 2019-06-07 DIAGNOSIS — E872 Acidosis: Secondary | ICD-10-CM | POA: Diagnosis present

## 2019-06-07 DIAGNOSIS — Z87891 Personal history of nicotine dependence: Secondary | ICD-10-CM | POA: Diagnosis not present

## 2019-06-07 DIAGNOSIS — Z7901 Long term (current) use of anticoagulants: Secondary | ICD-10-CM | POA: Diagnosis not present

## 2019-06-07 DIAGNOSIS — Z7951 Long term (current) use of inhaled steroids: Secondary | ICD-10-CM | POA: Diagnosis not present

## 2019-06-07 DIAGNOSIS — J9601 Acute respiratory failure with hypoxia: Secondary | ICD-10-CM | POA: Diagnosis not present

## 2019-06-07 DIAGNOSIS — I10 Essential (primary) hypertension: Secondary | ICD-10-CM | POA: Diagnosis not present

## 2019-06-07 DIAGNOSIS — J449 Chronic obstructive pulmonary disease, unspecified: Secondary | ICD-10-CM | POA: Diagnosis not present

## 2019-06-07 DIAGNOSIS — Z7902 Long term (current) use of antithrombotics/antiplatelets: Secondary | ICD-10-CM | POA: Diagnosis not present

## 2019-06-07 DIAGNOSIS — R0602 Shortness of breath: Secondary | ICD-10-CM | POA: Diagnosis not present

## 2019-06-07 DIAGNOSIS — E039 Hypothyroidism, unspecified: Secondary | ICD-10-CM | POA: Diagnosis not present

## 2019-06-07 DIAGNOSIS — Z20828 Contact with and (suspected) exposure to other viral communicable diseases: Secondary | ICD-10-CM | POA: Diagnosis not present

## 2019-06-07 DIAGNOSIS — J9602 Acute respiratory failure with hypercapnia: Secondary | ICD-10-CM | POA: Diagnosis not present

## 2019-06-07 DIAGNOSIS — Z8673 Personal history of transient ischemic attack (TIA), and cerebral infarction without residual deficits: Secondary | ICD-10-CM | POA: Diagnosis not present

## 2019-06-07 DIAGNOSIS — E871 Hypo-osmolality and hyponatremia: Secondary | ICD-10-CM | POA: Diagnosis present

## 2019-06-07 DIAGNOSIS — G9341 Metabolic encephalopathy: Secondary | ICD-10-CM | POA: Diagnosis present

## 2019-06-07 DIAGNOSIS — R0600 Dyspnea, unspecified: Secondary | ICD-10-CM | POA: Diagnosis not present

## 2019-06-07 DIAGNOSIS — K219 Gastro-esophageal reflux disease without esophagitis: Secondary | ICD-10-CM | POA: Diagnosis present

## 2019-06-07 DIAGNOSIS — Z888 Allergy status to other drugs, medicaments and biological substances status: Secondary | ICD-10-CM | POA: Diagnosis not present

## 2019-06-07 DIAGNOSIS — I358 Other nonrheumatic aortic valve disorders: Secondary | ICD-10-CM | POA: Diagnosis not present

## 2019-06-07 DIAGNOSIS — Z880 Allergy status to penicillin: Secondary | ICD-10-CM | POA: Diagnosis not present

## 2019-06-07 DIAGNOSIS — J441 Chronic obstructive pulmonary disease with (acute) exacerbation: Secondary | ICD-10-CM | POA: Diagnosis not present

## 2019-06-07 DIAGNOSIS — Z79899 Other long term (current) drug therapy: Secondary | ICD-10-CM | POA: Diagnosis not present

## 2019-06-07 DIAGNOSIS — Z902 Acquired absence of lung [part of]: Secondary | ICD-10-CM | POA: Diagnosis not present

## 2019-06-07 DIAGNOSIS — Z7982 Long term (current) use of aspirin: Secondary | ICD-10-CM | POA: Diagnosis not present

## 2019-06-07 DIAGNOSIS — R9431 Abnormal electrocardiogram [ECG] [EKG]: Secondary | ICD-10-CM | POA: Diagnosis not present

## 2019-06-07 DIAGNOSIS — Z96642 Presence of left artificial hip joint: Secondary | ICD-10-CM | POA: Diagnosis present

## 2019-06-07 DIAGNOSIS — R0689 Other abnormalities of breathing: Secondary | ICD-10-CM | POA: Diagnosis not present

## 2019-06-07 DIAGNOSIS — Z85118 Personal history of other malignant neoplasm of bronchus and lung: Secondary | ICD-10-CM | POA: Diagnosis not present

## 2019-06-07 DIAGNOSIS — C3490 Malignant neoplasm of unspecified part of unspecified bronchus or lung: Secondary | ICD-10-CM | POA: Diagnosis not present

## 2019-06-07 DIAGNOSIS — R918 Other nonspecific abnormal finding of lung field: Secondary | ICD-10-CM | POA: Diagnosis not present

## 2019-06-07 DIAGNOSIS — Z9981 Dependence on supplemental oxygen: Secondary | ICD-10-CM | POA: Diagnosis not present

## 2019-06-07 DIAGNOSIS — R7989 Other specified abnormal findings of blood chemistry: Secondary | ICD-10-CM | POA: Diagnosis present

## 2019-06-07 DIAGNOSIS — E873 Alkalosis: Secondary | ICD-10-CM | POA: Diagnosis present

## 2019-06-07 DIAGNOSIS — F419 Anxiety disorder, unspecified: Secondary | ICD-10-CM | POA: Diagnosis present

## 2019-06-07 HISTORY — DX: Acute respiratory failure with hypercapnia: J96.02

## 2019-06-07 HISTORY — DX: Acute respiratory failure with hypoxia: J96.01

## 2019-06-13 ENCOUNTER — Telehealth: Payer: Self-pay | Admitting: Physician Assistant

## 2019-06-13 MED ORDER — IPRATROPIUM-ALBUTEROL 0.5-2.5 (3) MG/3ML IN SOLN
3.00 | RESPIRATORY_TRACT | Status: DC
Start: 2019-06-11 — End: 2019-06-13

## 2019-06-13 MED ORDER — PREDNISONE 20 MG PO TABS
40.00 | ORAL_TABLET | ORAL | Status: DC
Start: 2019-06-12 — End: 2019-06-13

## 2019-06-13 MED ORDER — ALPRAZOLAM 1 MG PO TABS
1.00 | ORAL_TABLET | ORAL | Status: DC
Start: ? — End: 2019-06-13

## 2019-06-13 MED ORDER — IPRATROPIUM-ALBUTEROL 0.5-2.5 (3) MG/3ML IN SOLN
3.00 | RESPIRATORY_TRACT | Status: DC
Start: ? — End: 2019-06-13

## 2019-06-13 MED ORDER — PANTOPRAZOLE SODIUM 40 MG PO TBEC
40.00 | DELAYED_RELEASE_TABLET | ORAL | Status: DC
Start: 2019-06-12 — End: 2019-06-13

## 2019-06-13 MED ORDER — AZITHROMYCIN 250 MG PO TABS
250.00 | ORAL_TABLET | ORAL | Status: DC
Start: 2019-06-12 — End: 2019-06-13

## 2019-06-13 MED ORDER — CANDESARTAN CILEXETIL 32 MG PO TABS
32.00 | ORAL_TABLET | ORAL | Status: DC
Start: 2019-06-12 — End: 2019-06-13

## 2019-06-13 MED ORDER — ACETAMINOPHEN 650 MG RE SUPP
650.00 | RECTAL | Status: DC
Start: ? — End: 2019-06-13

## 2019-06-13 MED ORDER — ALBUTEROL SULFATE (2.5 MG/3ML) 0.083% IN NEBU
2.50 | INHALATION_SOLUTION | RESPIRATORY_TRACT | Status: DC
Start: ? — End: 2019-06-13

## 2019-06-13 MED ORDER — DOCUSATE SODIUM 100 MG PO CAPS
100.00 | ORAL_CAPSULE | ORAL | Status: DC
Start: 2019-06-11 — End: 2019-06-13

## 2019-06-13 MED ORDER — MUPIROCIN 2 % EX OINT
TOPICAL_OINTMENT | CUTANEOUS | Status: DC
Start: 2019-06-11 — End: 2019-06-13

## 2019-06-13 MED ORDER — LABETALOL HCL 5 MG/ML IV SOLN
20.00 | INTRAVENOUS | Status: DC
Start: ? — End: 2019-06-13

## 2019-06-13 MED ORDER — SODIUM CHLORIDE 0.9 % IV SOLN
10.00 | INTRAVENOUS | Status: DC
Start: ? — End: 2019-06-13

## 2019-06-13 MED ORDER — IOPAMIDOL (ISOVUE-370) INJECTION 76%
100.00 | INTRAVENOUS | Status: DC
Start: ? — End: 2019-06-13

## 2019-06-13 MED ORDER — CLONIDINE 0.3 MG/24HR TD PTWK
1.00 | MEDICATED_PATCH | TRANSDERMAL | Status: DC
Start: 2019-06-15 — End: 2019-06-13

## 2019-06-13 MED ORDER — NYSTATIN 100000 UNIT/ML MT SUSP
5.00 | OROMUCOSAL | Status: DC
Start: ? — End: 2019-06-13

## 2019-06-13 MED ORDER — ACETAMINOPHEN 325 MG PO TABS
650.00 | ORAL_TABLET | ORAL | Status: DC
Start: ? — End: 2019-06-13

## 2019-06-13 MED ORDER — PRAVASTATIN SODIUM 40 MG PO TABS
40.00 | ORAL_TABLET | ORAL | Status: DC
Start: 2019-06-11 — End: 2019-06-13

## 2019-06-13 MED ORDER — ENOXAPARIN SODIUM 40 MG/0.4ML ~~LOC~~ SOLN
40.00 | SUBCUTANEOUS | Status: DC
Start: 2019-06-12 — End: 2019-06-13

## 2019-06-13 MED ORDER — HYDRALAZINE HCL 20 MG/ML IJ SOLN
20.00 | INTRAMUSCULAR | Status: DC
Start: ? — End: 2019-06-13

## 2019-06-13 MED ORDER — GENERIC EXTERNAL MEDICATION
Status: DC
Start: ? — End: 2019-06-13

## 2019-06-13 MED ORDER — LEVOTHYROXINE SODIUM 50 MCG PO TABS
50.00 | ORAL_TABLET | ORAL | Status: DC
Start: 2019-06-12 — End: 2019-06-13

## 2019-06-13 MED ORDER — LABETALOL HCL 200 MG PO TABS
200.00 | ORAL_TABLET | ORAL | Status: DC
Start: 2019-06-11 — End: 2019-06-13

## 2019-06-13 MED ORDER — QUETIAPINE FUMARATE 25 MG PO TABS
50.00 | ORAL_TABLET | ORAL | Status: DC
Start: 2019-06-11 — End: 2019-06-13

## 2019-06-13 NOTE — Telephone Encounter (Signed)
Please call [facility] and let them know we are going to need to reschedule colonoscopy due to recent ED visit.   Also can we call patient and get her on next week for hospital follow up.

## 2019-06-14 NOTE — Telephone Encounter (Signed)
Called [facility], appt cancelled.   Virtual or in person for hosp f/u?

## 2019-06-14 NOTE — Telephone Encounter (Signed)
Appointment has been made. No further questions at this time.  

## 2019-06-14 NOTE — Telephone Encounter (Signed)
In person. It was not due to covid.

## 2019-06-14 NOTE — Telephone Encounter (Signed)
Please call and schedule. Thanks!!

## 2019-06-21 ENCOUNTER — Other Ambulatory Visit: Payer: Self-pay

## 2019-06-21 ENCOUNTER — Ambulatory Visit (INDEPENDENT_AMBULATORY_CARE_PROVIDER_SITE_OTHER): Payer: Medicare Other | Admitting: Physician Assistant

## 2019-06-21 VITALS — BP 151/47 | HR 62 | Temp 98.1°F | Ht 63.0 in | Wt 118.0 lb

## 2019-06-21 DIAGNOSIS — R0603 Acute respiratory distress: Secondary | ICD-10-CM | POA: Diagnosis not present

## 2019-06-21 DIAGNOSIS — E782 Mixed hyperlipidemia: Secondary | ICD-10-CM | POA: Diagnosis not present

## 2019-06-21 DIAGNOSIS — J9611 Chronic respiratory failure with hypoxia: Secondary | ICD-10-CM | POA: Diagnosis not present

## 2019-06-21 DIAGNOSIS — Z23 Encounter for immunization: Secondary | ICD-10-CM

## 2019-06-21 DIAGNOSIS — J449 Chronic obstructive pulmonary disease, unspecified: Secondary | ICD-10-CM

## 2019-06-21 DIAGNOSIS — R0689 Other abnormalities of breathing: Secondary | ICD-10-CM | POA: Diagnosis not present

## 2019-06-21 DIAGNOSIS — J9612 Chronic respiratory failure with hypercapnia: Secondary | ICD-10-CM | POA: Diagnosis not present

## 2019-06-21 DIAGNOSIS — F418 Other specified anxiety disorders: Secondary | ICD-10-CM | POA: Diagnosis not present

## 2019-06-21 DIAGNOSIS — G2581 Restless legs syndrome: Secondary | ICD-10-CM | POA: Diagnosis not present

## 2019-06-21 DIAGNOSIS — D509 Iron deficiency anemia, unspecified: Secondary | ICD-10-CM

## 2019-06-21 DIAGNOSIS — E890 Postprocedural hypothyroidism: Secondary | ICD-10-CM

## 2019-06-21 DIAGNOSIS — I10 Essential (primary) hypertension: Secondary | ICD-10-CM | POA: Diagnosis not present

## 2019-06-21 MED ORDER — VALSARTAN 320 MG PO TABS: 320.0000 mg | ORAL_TABLET | Freq: Every day | ORAL | 3 refills | Status: DC

## 2019-06-21 MED ORDER — LEVOTHYROXINE SODIUM 50 MCG PO TABS: ORAL_TABLET | ORAL | 3 refills | Status: DC

## 2019-06-21 MED ORDER — ALPRAZOLAM 1 MG PO TABS: 1.0000 mg | ORAL_TABLET | Freq: Every day | ORAL | 1 refills | Status: DC | PRN

## 2019-06-21 MED ORDER — ATORVASTATIN CALCIUM 20 MG PO TABS: 20.0000 mg | ORAL_TABLET | Freq: Every day | ORAL | 3 refills | Status: DC

## 2019-06-21 MED ORDER — ROPINIROLE HCL 0.25 MG PO TABS: 0.2500 mg | ORAL_TABLET | Freq: Every day | ORAL | 0 refills | Status: DC

## 2019-06-21 MED ORDER — ROPINIROLE HCL 0.25 MG PO TABS: ORAL_TABLET | ORAL | 0 refills | Status: DC

## 2019-06-21 NOTE — Progress Notes (Signed)
l °

## 2019-06-22 NOTE — Progress Notes (Signed)
Can we add peripheral smear, retic count, ferritin, transferrin, serum iron for anemia evaluation.   Sodium just a hair off.  Magnesium and potassium look great.  Protein a little low. Make sure eating protein in diet.

## 2019-06-24 LAB — COMPLETE METABOLIC PANEL WITH GFR
AG Ratio: 1.7 (calc) (ref 1.0–2.5)
ALT: 18 U/L (ref 6–29)
AST: 19 U/L (ref 10–35)
Albumin: 3.8 g/dL (ref 3.6–5.1)
Alkaline phosphatase (APISO): 73 U/L (ref 37–153)
BUN/Creatinine Ratio: 14 (calc) (ref 6–22)
BUN: 8 mg/dL (ref 7–25)
CO2: 36 mmol/L — ABNORMAL HIGH (ref 20–32)
Calcium: 9.1 mg/dL (ref 8.6–10.4)
Chloride: 92 mmol/L — ABNORMAL LOW (ref 98–110)
Creat: 0.57 mg/dL — ABNORMAL LOW (ref 0.60–0.93)
GFR, Est African American: 107 mL/min/{1.73_m2} (ref >=60)
GFR, Est Non African American: 93 mL/min/{1.73_m2} (ref >=60)
Globulin: 2.2 g/dL (calc) (ref 1.9–3.7)
Glucose, Bld: 99 mg/dL (ref 65–99)
Potassium: 4.7 mmol/L (ref 3.5–5.3)
Sodium: 134 mmol/L — ABNORMAL LOW (ref 135–146)
Total Bilirubin: 0.3 mg/dL (ref 0.2–1.2)
Total Protein: 6 g/dL — ABNORMAL LOW (ref 6.1–8.1)

## 2019-06-24 LAB — CBC WITH DIFFERENTIAL/PLATELET
Absolute Monocytes: 743 cells/uL (ref 200–950)
Basophils Absolute: 32 cells/uL (ref 0–200)
Basophils Relative: 0.4 %
Eosinophils Absolute: 253 cells/uL (ref 15–500)
Eosinophils Relative: 3.2 %
HCT: 28.5 % — ABNORMAL LOW (ref 35.0–45.0)
Hemoglobin: 9.3 g/dL — ABNORMAL LOW (ref 11.7–15.5)
Lymphs Abs: 1272 cells/uL (ref 850–3900)
MCH: 30.5 pg (ref 27.0–33.0)
MCHC: 32.6 g/dL (ref 32.0–36.0)
MCV: 93.4 fL (ref 80.0–100.0)
MPV: 11.3 fL (ref 7.5–12.5)
Monocytes Relative: 9.4 %
Neutro Abs: 5601 cells/uL (ref 1500–7800)
Neutrophils Relative %: 70.9 %
Platelets: 298 10*3/uL (ref 140–400)
RBC: 3.05 10*6/uL — ABNORMAL LOW (ref 3.80–5.10)
RDW: 11.7 % (ref 11.0–15.0)
Total Lymphocyte: 16.1 %
WBC: 7.9 10*3/uL (ref 3.8–10.8)

## 2019-06-24 LAB — IRON, TOTAL REFL: Iron: 30 ug/dL — ABNORMAL LOW (ref 45–160)

## 2019-06-24 LAB — MAGNESIUM: Magnesium: 1.9 mg/dL (ref 1.5–2.5)

## 2019-06-24 LAB — TRANSFERRIN: Transferrin: 230 mg/dL (ref 188–341)

## 2019-06-24 LAB — FERRITIN: Ferritin: 48 ng/mL (ref 16–288)

## 2019-06-24 LAB — RETICULOCYTES
ABS Retic: 27180 cells/uL (ref 20000–8000)
Retic Ct Pct: 0.9 %

## 2019-06-28 ENCOUNTER — Encounter: Payer: Self-pay | Admitting: Physician Assistant

## 2019-06-28 DIAGNOSIS — R0689 Other abnormalities of breathing: Secondary | ICD-10-CM | POA: Insufficient documentation

## 2019-06-28 DIAGNOSIS — R0603 Acute respiratory distress: Secondary | ICD-10-CM | POA: Insufficient documentation

## 2019-06-28 DIAGNOSIS — G2581 Restless legs syndrome: Secondary | ICD-10-CM | POA: Insufficient documentation

## 2019-06-28 HISTORY — DX: Other abnormalities of breathing: R06.89

## 2019-06-28 NOTE — Progress Notes (Signed)
Subjective:    Patient ID: [name], female    DOB: November 25, 1945, 73 y.o.   MRN: [id number]  HPI  Pt is a 73 yo female with chronic respiratory failure, COPD, hx of lung cancer, HTN who presents to the clinic for hospital follow up. She went to ED on 06/07/19 for dyspnea and confusion she was discharged on 06/11/19. She was found to have hypercapnia with respiratory acidosis, elevated BP, slightly elevated proBNP, CXR with possible mild CHF. She was placed on bipap and admitted to ICU.   Pt was negative for COVID and treated with steroids and abx. She was approved for home Trilogy.  CO2.   No changes made to BP medications. Echo done with normal EF of 60-65 percent.   No more nosebleeds.   She is doing better. She loves the trilogy machine at home. She continues to O2 about 3-4L all the time but if she gets headache or SOB she can go lay with Bipap machine for 1hr and feel much better. She sleeps all night with it. She denies any CP or palpitations. Headaches have much improved.   She does report some "jumpy legs at night" that make it harder to rest and go to sleep. She has had this for a while but starting to really bother her.   .. Active Ambulatory Problems    Diagnosis Date Noted  . History of lung cancer 07/28/2006  . HYPOTHYROIDISM, POSTSURGICAL 05/19/2007  . HYPERCHOLESTEROLEMIA 07/28/2006  . ANEMIA, IRON DEFICIENCY NOS 01/04/2007  . Major depressive disorder, recurrent episode ([place]) 07/28/2006  . Anxiety state 07/28/2006  . HYPERTENSION, BENIGN SYSTEMIC 07/28/2006  . DISEASE, ISCHEMIC HEART, CHRONIC NOS 05/19/2007  . PERIPHERAL VASCULAR DISEASE, UNSPEC. 07/28/2006  . COPD with chronic bronchitis 07/28/2006  . Hypoxemia 06/22/2013  . History of hip fracture 09/02/2013  . Insomnia 09/04/2013  . GERD (gastroesophageal reflux disease) 09/04/2013  . Sensorineural hearing loss of both ears 11/18/2013  . Systolic murmur 62/13/0865  . Episode of abnormal behavior  11/28/2013  . Word finding difficulty 11/28/2013  . Memory loss 11/28/2013  . Transient cerebral ischemic attack 03/01/2014  . Carotid artery narrowing 02/27/2014  . Clinical depression 02/27/2014  . Cerebrovascular accident, old 06/14/2013  . IFG (impaired fasting glucose) 04/12/2015  . Elevated blood pressure 10/16/2015  . Split S2 (second heart sound) 10/16/2015  . Anxiety as acute reaction to exceptional stress 12/05/2015  . Muscle spasm of back 12/05/2015  . Memory changes 12/05/2015  . Osteopenia 04/30/2016  . Chronic pain of left ankle 12/16/2016  . Left foot pain 02/17/2017  . Cavus foot, acquired 04/28/2017  . Depression with anxiety 08/01/2017  . Lung cancer ([place]) 08/01/2017  . Exertional chest pain 01/26/2018  . Pulmonary embolism ([place]) 01/29/2018  . Venous stasis of lower extremity 02/25/2018  . Primary osteoarthritis of both hands 02/25/2018  . Chronic respiratory failure ([place]) 04/02/2018  . Breast asymmetry 06/21/2018  . Headache, new daily persistent ([facility]) 07/28/2018  . Uncontrolled stage 2 hypertension 07/30/2018  . Anemia 12/20/2018  . Hyponatremia 05/04/2019  . Epigastric pain 05/04/2019  . Hypercapnia 06/28/2019  . Acute respiratory distress 06/28/2019  . RLS (restless legs syndrome) 06/28/2019   Resolved Ambulatory Problems    Diagnosis Date Noted  . THRUSH 09/26/2010  . Abdominal bloating 03/04/2011  .  Chronic Bronchopleural fistula    Past Medical History:  Diagnosis Date  . CAD (coronary artery disease)   . Cancer ([place]) 2007  . COPD (chronic obstructive pulmonary disease) (  [place]) 9/10  . Hurthle cell adenoma of thyroid 2007  . Hypertension   . Iron deficiency anemia 2/08  . Peripheral vascular disease ([place])       Review of Systems See HPI>     Objective:   Physical Exam Vitals signs reviewed.  Constitutional:      Appearance: Normal appearance.     Comments: On O2 2L.   HENT:     Head: Normocephalic.  Cardiovascular:     Rate and  Rhythm: Normal rate and regular rhythm.  Pulmonary:     Effort: Pulmonary effort is normal.     Breath sounds: No wheezing or rhonchi.     Comments: Absent breath sounds over left upper lobe.  Neurological:     General: No focal deficit present.     Mental Status: She is alert and oriented to person, place, and time.  Psychiatric:        Mood and Affect: Mood normal.           Assessment & Plan:  Marland KitchenMarland KitchenTrinaty was seen today for hospitalization follow-up.  Diagnoses and all orders for this visit:  Acute respiratory distress  HYPERTENSION, BENIGN SYSTEMIC -     valsartan (DIOVAN) 320 MG tablet; Take 1 tablet (320 mg total) by mouth daily. -     COMPLETE METABOLIC PANEL WITH GFR -     Magnesium -     CBC w/Diff/Platelet  HYPOTHYROIDISM, POSTSURGICAL -     levothyroxine (SYNTHROID) 50 MCG tablet; take 1 tablet by mouth once daily  Iron deficiency anemia, unspecified iron deficiency anemia type -     CBC w/Diff/Platelet  Depression with anxiety -     ALPRAZolam (XANAX) 1 MG tablet; Take 1 tablet (1 mg total) by mouth daily as needed for anxiety.  Hypercapnia  Chronic respiratory failure with hypoxia and hypercapnia (HCC)  COPD with chronic bronchitis  RLS (restless legs syndrome) -     rOPINIRole (REQUIP) 0.25 MG tablet; Take 1-4 tablets at bedtime for restless legs.  Need for influenza vaccination -     Flu Vaccine QUAD 36+ mos IM  Mixed hyperlipidemia -     atorvastatin (LIPITOR) 20 MG tablet; Take 1 tablet (20 mg total) by mouth at bedtime. take 1 tablet by mouth once daily  Other orders -     Discontinue: rOPINIRole (REQUIP) 0.25 MG tablet; Take 1 tablet (0.25 mg total) by mouth at bedtime.   Pt is doing much better today.  BP goal is under 150/90. She is very close today. I still suspect that BP elevated due to dyspnea. I think the trilogy machine at home and is helping her BP control. Continue on continuous O2 at home.   Will recheck CMP, CBC, magnesium today.   hgb decreased in past likely more due to anemia of chronic disease.   Flu shot given today without any problems.

## 2019-07-14 ENCOUNTER — Other Ambulatory Visit: Payer: Self-pay | Admitting: Physician Assistant

## 2019-07-14 DIAGNOSIS — I10 Essential (primary) hypertension: Secondary | ICD-10-CM

## 2019-08-25 ENCOUNTER — Other Ambulatory Visit: Payer: Self-pay | Admitting: Physician Assistant

## 2019-08-25 DIAGNOSIS — R1013 Epigastric pain: Secondary | ICD-10-CM

## 2019-08-25 DIAGNOSIS — K219 Gastro-esophageal reflux disease without esophagitis: Secondary | ICD-10-CM

## 2019-09-20 ENCOUNTER — Other Ambulatory Visit: Payer: Self-pay

## 2019-09-20 ENCOUNTER — Ambulatory Visit (INDEPENDENT_AMBULATORY_CARE_PROVIDER_SITE_OTHER): Payer: Medicare Other | Admitting: Physician Assistant

## 2019-09-20 VITALS — BP 140/50 | HR 102 | Ht 63.0 in | Wt 113.0 lb

## 2019-09-20 DIAGNOSIS — D509 Iron deficiency anemia, unspecified: Secondary | ICD-10-CM

## 2019-09-20 DIAGNOSIS — I159 Secondary hypertension, unspecified: Secondary | ICD-10-CM

## 2019-09-20 DIAGNOSIS — J449 Chronic obstructive pulmonary disease, unspecified: Secondary | ICD-10-CM

## 2019-09-20 DIAGNOSIS — Z79899 Other long term (current) drug therapy: Secondary | ICD-10-CM | POA: Diagnosis not present

## 2019-09-20 DIAGNOSIS — I10 Essential (primary) hypertension: Secondary | ICD-10-CM

## 2019-09-20 DIAGNOSIS — F330 Major depressive disorder, recurrent, mild: Secondary | ICD-10-CM | POA: Diagnosis not present

## 2019-09-20 DIAGNOSIS — R5382 Chronic fatigue, unspecified: Secondary | ICD-10-CM

## 2019-09-20 MED ORDER — VORTIOXETINE HBR 20 MG PO TABS: 20.0000 mg | ORAL_TABLET | Freq: Every day | ORAL | 1 refills | Status: DC

## 2019-09-20 NOTE — Progress Notes (Signed)
Subjective:    Patient ID: [name], female    DOB: 1946/05/14, 73 y.o.   MRN: [id number]  HPI  Pt is a 73 yo female with COPD, chronic respiratory hypoxia on continuous O2, HTN, PAD, TIA, MDD who presents to the clinic for medication refills.   She is accompanied by her husband.   She is doing ok. Breathing is better now that she has bipap at home to use as needed. If she gets a headache or feels weak she just lays down with bipap for hour or so. She uses o2 most all day and all night.   Her mood is ok. She is still down a lot. No SI/HC.   She is very tired all the time. She wonders if anything she could do for fatigue.   .. Active Ambulatory Problems    Diagnosis Date Noted  . History of lung cancer 07/28/2006  . HYPOTHYROIDISM, POSTSURGICAL 05/19/2007  . HYPERCHOLESTEROLEMIA 07/28/2006  . ANEMIA, IRON DEFICIENCY NOS 01/04/2007  . Major depressive disorder, recurrent episode ([place]) 07/28/2006  . Anxiety state 07/28/2006  . HYPERTENSION, BENIGN SYSTEMIC 07/28/2006  . DISEASE, ISCHEMIC HEART, CHRONIC NOS 05/19/2007  . PERIPHERAL VASCULAR DISEASE, UNSPEC. 07/28/2006  . COPD with chronic bronchitis 07/28/2006  . Hypoxemia 06/22/2013  . History of hip fracture 09/02/2013  . Insomnia 09/04/2013  . GERD (gastroesophageal reflux disease) 09/04/2013  . Sensorineural hearing loss of both ears 11/18/2013  . Systolic murmur 15/02/6978  . Episode of abnormal behavior 11/28/2013  . Word finding difficulty 11/28/2013  . Memory loss 11/28/2013  . Transient cerebral ischemic attack 03/01/2014  . Carotid artery narrowing 02/27/2014  . Clinical depression 02/27/2014  . Cerebrovascular accident, old 06/14/2013  . IFG (impaired fasting glucose) 04/12/2015  . Elevated blood pressure 10/16/2015  . Split S2 (second heart sound) 10/16/2015  . Anxiety as acute reaction to exceptional stress 12/05/2015  . Muscle spasm of back 12/05/2015  . Memory changes 12/05/2015  . Osteopenia  04/30/2016  . Chronic pain of left ankle 12/16/2016  . Left foot pain 02/17/2017  . Cavus foot, acquired 04/28/2017  . Depression with anxiety 08/01/2017  . Lung cancer ([place]) 08/01/2017  . Exertional chest pain 01/26/2018  . Pulmonary embolism ([place]) 01/29/2018  . Venous stasis of lower extremity 02/25/2018  . Primary osteoarthritis of both hands 02/25/2018  . Chronic respiratory failure ([place]) 04/02/2018  . Breast asymmetry 06/21/2018  . Headache, new daily persistent ([facility]) 07/28/2018  . Hypertension 07/30/2018  . Anemia 12/20/2018  . Hyponatremia 05/04/2019  . Epigastric pain 05/04/2019  . Hypercapnia 06/28/2019  . Acute respiratory distress 06/28/2019  . RLS (restless legs syndrome) 06/28/2019   Resolved Ambulatory Problems    Diagnosis Date Noted  . THRUSH 09/26/2010  . Abdominal bloating 03/04/2011  .  Chronic Bronchopleural fistula    Past Medical History:  Diagnosis Date  . CAD (coronary artery disease)   . Cancer ([place]) 2007  . COPD (chronic obstructive pulmonary disease) ([place]) 9/10  . Hurthle cell adenoma of thyroid 2007  . Iron deficiency anemia 2/08  . Peripheral vascular disease ([place])       Review of Systems See HPI.     Objective:   Physical Exam Vitals signs reviewed.  HENT:     Head: Normocephalic.     Right Ear: Tympanic membrane normal.     Left Ear: Tympanic membrane normal.  Cardiovascular:     Rate and Rhythm: Regular rhythm. Tachycardia present.     Pulses: Normal pulses.  Pulmonary:     Effort: Pulmonary effort is normal.     Breath sounds: No wheezing or rhonchi.     Comments: Absent left upper lobe breath sounds.  Lymphadenopathy:     Cervical: No cervical adenopathy.  Skin:    Findings: No rash.  Neurological:     General: No focal deficit present.     Mental Status: She is alert and oriented to person, place, and time.     .. Depression screen [facility] 2/9 09/20/2019 04/19/2019 12/17/2018 09/13/2018 07/15/2018  Decreased Interest 3 0  2 3 0  Down, Depressed, Hopeless 2 1 0 - 1  PHQ - 2 Score 5 1 2 3 1   Altered sleeping 3 - 0 0 1  Tired, decreased energy 3 - 3 - 3  Change in appetite 1 - 3 3 3   Feeling bad or failure about yourself  3 - 0 3 0  Trouble concentrating 0 - 0 3 0  Moving slowly or fidgety/restless 3 - 0 0 0  Suicidal thoughts 0 - 0 0 0  PHQ-9 Score 18 - 8 12 8   Difficult doing work/chores Very difficult - Not difficult at all Not difficult at all Not difficult at all  Some recent data might be hidden   .Marland Kitchen GAD 7 : Generalized Anxiety Score 09/20/2019 12/17/2018 09/13/2018 07/15/2018  Nervous, Anxious, on Edge 3 0 3 3  Control/stop worrying 3 3 3 3   Worry too much - different things 3 3 3 3   Trouble relaxing 1 0 3 0  Restless 1 2 2 3   Easily annoyed or irritable 3 3 3 3   Afraid - awful might happen 0 2 3 3   Total GAD 7 Score 14 13 20 18   Anxiety Difficulty Very difficult Not difficult at all Not difficult at all Not difficult at all          Assessment & Plan:  Marland KitchenMarland KitchenTessica was seen today for follow-up.  Diagnoses and all orders for this visit:  Chronic fatigue  Mild episode of recurrent major depressive disorder (HCC) -     vortioxetine HBr (TRINTELLIX) 20 MG TABS tablet; Take 1 tablet (20 mg total) by mouth daily. -     COMPLETE METABOLIC PANEL WITH GFR  Medication management -     COMPLETE METABOLIC PANEL WITH GFR -     CBC  HYPERTENSION, BENIGN SYSTEMIC  COPD with chronic bronchitis  Iron deficiency anemia, unspecified iron deficiency anemia type -     CBC -     ferrous sulfate 325 (65 FE) MG EC tablet; Take 1 tablet (325 mg total) by mouth 2 (two) times daily with a meal.  Secondary hypertension  Essential hypertension   Will check some labs for fatigue. With her lung function some fatigue is expected.  Pulse ox great on 2L of O2.  Pt is constantly sOB. Recommended follow up with pulmonology to see if anything else could be done to maximize lung function. On trelegy.  BP stable.   PhQ-9 stable but elevated.  Recheck in 3 months.

## 2019-09-21 LAB — CBC
HCT: 27.2 % — ABNORMAL LOW (ref 35.0–45.0)
Hemoglobin: 8.9 g/dL — ABNORMAL LOW (ref 11.7–15.5)
MCH: 30.6 pg (ref 27.0–33.0)
MCHC: 32.7 g/dL (ref 32.0–36.0)
MCV: 93.5 fL (ref 80.0–100.0)
MPV: 11.2 fL (ref 7.5–12.5)
Platelets: 255 10*3/uL (ref 140–400)
RBC: 2.91 10*6/uL — ABNORMAL LOW (ref 3.80–5.10)
RDW: 12.5 % (ref 11.0–15.0)
WBC: 7.2 10*3/uL (ref 3.8–10.8)

## 2019-09-21 LAB — COMPLETE METABOLIC PANEL WITH GFR
AG Ratio: 1.7 (calc) (ref 1.0–2.5)
ALT: 10 U/L (ref 6–29)
AST: 15 U/L (ref 10–35)
Albumin: 4 g/dL (ref 3.6–5.1)
Alkaline phosphatase (APISO): 82 U/L (ref 37–153)
BUN: 20 mg/dL (ref 7–25)
CO2: 36 mmol/L — ABNORMAL HIGH (ref 20–32)
Calcium: 9.3 mg/dL (ref 8.6–10.4)
Chloride: 95 mmol/L — ABNORMAL LOW (ref 98–110)
Creat: 0.66 mg/dL (ref 0.60–0.93)
GFR, Est African American: 102 mL/min/{1.73_m2} (ref >=60)
GFR, Est Non African American: 88 mL/min/{1.73_m2} (ref >=60)
Globulin: 2.4 g/dL (calc) (ref 1.9–3.7)
Glucose, Bld: 114 mg/dL — ABNORMAL HIGH (ref 65–99)
Potassium: 4.5 mmol/L (ref 3.5–5.3)
Sodium: 135 mmol/L (ref 135–146)
Total Bilirubin: 0.4 mg/dL (ref 0.2–1.2)
Total Protein: 6.4 g/dL (ref 6.1–8.1)

## 2019-09-21 NOTE — Progress Notes (Signed)
Nutrition looks good. Protein in normal range.   Hemoglobin has dropped a little more. When is last time you saw hematology? [facility] right?

## 2019-09-23 ENCOUNTER — Other Ambulatory Visit: Payer: Self-pay | Admitting: Neurology

## 2019-09-23 DIAGNOSIS — D509 Iron deficiency anemia, unspecified: Secondary | ICD-10-CM

## 2019-09-23 MED ORDER — FERROUS SULFATE 325 (65 FE) MG PO TBEC: 325.0000 mg | DELAYED_RELEASE_TABLET | Freq: Two times a day (BID) | ORAL | 5 refills | Status: DC

## 2019-09-23 NOTE — Progress Notes (Signed)
Start ferrous sulfate sent to [facility] twice a day with meals. Likely anemia is coming from chronic disease. Recheck in 6 weeks lab only. If no improvement will send to hematology.

## 2019-09-26 ENCOUNTER — Encounter: Payer: Self-pay | Admitting: Physician Assistant

## 2019-10-21 DIAGNOSIS — I2510 Atherosclerotic heart disease of native coronary artery without angina pectoris: Secondary | ICD-10-CM

## 2019-10-21 DIAGNOSIS — Z9981 Dependence on supplemental oxygen: Secondary | ICD-10-CM | POA: Insufficient documentation

## 2019-10-21 HISTORY — DX: Atherosclerotic heart disease of native coronary artery without angina pectoris: I25.10

## 2019-10-24 ENCOUNTER — Other Ambulatory Visit: Payer: Self-pay | Admitting: Physician Assistant

## 2019-10-24 DIAGNOSIS — K219 Gastro-esophageal reflux disease without esophagitis: Secondary | ICD-10-CM

## 2019-10-24 DIAGNOSIS — R1013 Epigastric pain: Secondary | ICD-10-CM

## 2019-12-21 ENCOUNTER — Other Ambulatory Visit: Payer: Self-pay | Admitting: Neurology

## 2019-12-21 DIAGNOSIS — F330 Major depressive disorder, recurrent, mild: Secondary | ICD-10-CM

## 2019-12-21 DIAGNOSIS — F418 Other specified anxiety disorders: Secondary | ICD-10-CM

## 2019-12-21 MED ORDER — VORTIOXETINE HBR 20 MG PO TABS: 20.0000 mg | ORAL_TABLET | Freq: Every day | ORAL | 0 refills | Status: DC

## 2019-12-21 MED ORDER — TRELEGY ELLIPTA 100-62.5-25 MCG/INH IN AEPB: 1.0000 | INHALATION_SPRAY | Freq: Every day | RESPIRATORY_TRACT | 0 refills | Status: DC

## 2019-12-21 MED ORDER — QUETIAPINE FUMARATE 100 MG PO TABS: 100.0000 mg | ORAL_TABLET | Freq: Every day | ORAL | 0 refills | Status: DC

## 2019-12-30 ENCOUNTER — Other Ambulatory Visit: Payer: Self-pay

## 2019-12-30 ENCOUNTER — Ambulatory Visit (INDEPENDENT_AMBULATORY_CARE_PROVIDER_SITE_OTHER): Payer: Medicare Other | Admitting: Physician Assistant

## 2019-12-30 ENCOUNTER — Encounter: Payer: Self-pay | Admitting: Physician Assistant

## 2019-12-30 VITALS — BP 152/82 | HR 72 | Ht 63.0 in | Wt 114.0 lb

## 2019-12-30 DIAGNOSIS — K219 Gastro-esophageal reflux disease without esophagitis: Secondary | ICD-10-CM

## 2019-12-30 DIAGNOSIS — R1013 Epigastric pain: Secondary | ICD-10-CM | POA: Diagnosis not present

## 2019-12-30 DIAGNOSIS — F331 Major depressive disorder, recurrent, moderate: Secondary | ICD-10-CM | POA: Diagnosis not present

## 2019-12-30 DIAGNOSIS — I10 Essential (primary) hypertension: Secondary | ICD-10-CM

## 2019-12-30 DIAGNOSIS — R519 Headache, unspecified: Secondary | ICD-10-CM

## 2019-12-30 DIAGNOSIS — J9611 Chronic respiratory failure with hypoxia: Secondary | ICD-10-CM

## 2019-12-30 DIAGNOSIS — Z7185 Encounter for immunization safety counseling: Secondary | ICD-10-CM

## 2019-12-30 DIAGNOSIS — Z7189 Other specified counseling: Secondary | ICD-10-CM

## 2019-12-30 DIAGNOSIS — J9612 Chronic respiratory failure with hypercapnia: Secondary | ICD-10-CM | POA: Diagnosis not present

## 2019-12-30 MED ORDER — OMEPRAZOLE 40 MG PO CPDR: DELAYED_RELEASE_CAPSULE | ORAL | 3 refills | Status: DC

## 2019-12-30 MED ORDER — TOPIRAMATE 25 MG PO TABS: 25.0000 mg | ORAL_TABLET | Freq: Two times a day (BID) | ORAL | 2 refills | Status: DC

## 2019-12-30 NOTE — Patient Instructions (Signed)
General Headache Without Cause A headache is pain or discomfort felt around the head or neck area. The specific cause of a headache may not be found. There are many causes and types of headaches. A few common ones are:  Tension headaches.  Migraine headaches.  Cluster headaches.  Chronic daily headaches. Follow these instructions at home: Watch your condition for any changes. Let your health care provider know about them. Take these steps to help with your condition: Managing pain      Take over-the-counter and prescription medicines only as told by your health care provider.  Lie down in a dark, quiet room when you have a headache.  If directed, put ice on your head and neck area: ? Put ice in a plastic bag. ? Place a towel between your skin and the bag. ? Leave the ice on for 20 minutes, 2-3 times per day.  If directed, apply heat to the affected area. Use the heat source that your health care provider recommends, such as a moist heat pack or a heating pad. ? Place a towel between your skin and the heat source. ? Leave the heat on for 20-30 minutes. ? Remove the heat if your skin turns bright red. This is especially important if you are unable to feel pain, heat, or cold. You may have a greater risk of getting burned.  Keep lights dim if bright lights bother you or make your headaches worse. Eating and drinking  Eat meals on a regular schedule.  If you drink alcohol: ? Limit how much you use to:  0-1 drink a day for women.  0-2 drinks a day for men. ? Be aware of how much alcohol is in your drink. In the [place] one drink equals one 12 oz bottle of beer (355 mL), one 5 oz glass of wine (148 mL), or one 1 oz glass of hard liquor (44 mL).  Stop drinking caffeine, or decrease the amount of caffeine you drink. General instructions   Keep a headache journal to help find out what may trigger your headaches. For example, write down: ? What you eat and drink. ? How much  sleep you get. ? Any change to your diet or medicines.  Try massage or other relaxation techniques.  Limit stress.  Sit up straight, and do not tense your muscles.  Do not use any products that contain nicotine or tobacco, such as cigarettes, e-cigarettes, and chewing tobacco. If you need help quitting, ask your health care provider.  Exercise regularly as told by your health care provider.  Sleep on a regular schedule. Get 7-9 hours of sleep each night, or the amount recommended by your health care provider.  Keep all follow-up visits as told by your health care provider. This is important. Contact a health care provider if:  Your symptoms are not helped by medicine.  You have a headache that is different from the usual headache.  You have nausea or you vomit.  You have a fever. Get help right away if:  Your headache becomes severe quickly.  Your headache gets worse after moderate to intense physical activity.  You have repeated vomiting.  You have a stiff neck.  You have a loss of vision.  You have problems with speech.  You have pain in the eye or ear.  You have muscular weakness or loss of muscle control.  You lose your balance or have trouble walking.  You feel faint or pass out.  You have confusion.  You have a seizure. Summary  A headache is pain or discomfort felt around the head or neck area.  There are many causes and types of headaches. In some cases, the cause may not be found.  Keep a headache journal to help find out what may trigger your headaches. Watch your condition for any changes. Let your health care provider know about them.  Contact a health care provider if you have a headache that is different from the usual headache, or if your symptoms are not helped by medicine.  Get help right away if your headache becomes severe, you vomit, you have a loss of vision, you lose your balance, or you have a seizure. This information is not  intended to replace advice given to you by your health care provider. Make sure you discuss any questions you have with your health care provider. Document Revised: 04/26/2018 Document Reviewed: 04/26/2018 Elsevier Patient Education  [place].

## 2019-12-30 NOTE — Progress Notes (Signed)
Subjective:    Patient ID: [name], female    DOB: [birth year], 74 y.o.   MRN: [id number]  HPI  Pt is a 74 yo female with HTN, PVD, COPD and chronic respiratory distress, GERD, severe depression, anxiety who presents to the clinic for follow up with her husband.   Pt checks her BP at home and pretty stable in the 140s over 60s. No CP, palpitations or vision changes. She notes that hooking up to her home bipap helps her BP if going high. She uses O2 continually at 2-3L. She has a daily headache for months that concerns her. She frequently lays down to rest. O2 therapy does seem to help. Located frontal. No vision changes, no nausea, no light or sound sensitivity.    She is depressed and anxious. She has a lot of family "drama" going on all the time. She takes trintellix and feels like it helps some.   .. Active Ambulatory Problems    Diagnosis Date Noted  . History of lung cancer 07/28/2006  . HYPOTHYROIDISM, POSTSURGICAL 05/19/2007  . HYPERCHOLESTEROLEMIA 07/28/2006  . ANEMIA, IRON DEFICIENCY NOS 01/04/2007  . Major depressive disorder, recurrent episode ([place]) 07/28/2006  . Anxiety state 07/28/2006  . HYPERTENSION, BENIGN SYSTEMIC 07/28/2006  . DISEASE, ISCHEMIC HEART, CHRONIC NOS 05/19/2007  . PERIPHERAL VASCULAR DISEASE, UNSPEC. 07/28/2006  . COPD with chronic bronchitis 07/28/2006  . Hypoxemia 06/22/2013  . History of hip fracture 09/02/2013  . Insomnia 09/04/2013  . GERD (gastroesophageal reflux disease) 09/04/2013  . Sensorineural hearing loss of both ears 11/18/2013  . Systolic murmur 19/50/9326  . Episode of abnormal behavior 11/28/2013  . Word finding difficulty 11/28/2013  . Memory loss 11/28/2013  . Transient cerebral ischemic attack 03/01/2014  . Carotid artery narrowing 02/27/2014  . Clinical depression 02/27/2014  . Cerebrovascular accident, old 06/14/2013  . IFG (impaired fasting glucose) 04/12/2015  . Elevated blood pressure 10/16/2015  . Split S2  (second heart sound) 10/16/2015  . Anxiety as acute reaction to exceptional stress 12/05/2015  . Muscle spasm of back 12/05/2015  . Memory changes 12/05/2015  . Osteopenia 04/30/2016  . Chronic pain of left ankle 12/16/2016  . Left foot pain 02/17/2017  . Cavus foot, acquired 04/28/2017  . Depression with anxiety 08/01/2017  . Lung cancer ([place]) 08/01/2017  . Exertional chest pain 01/26/2018  . Pulmonary embolism ([place]) 01/29/2018  . Venous stasis of lower extremity 02/25/2018  . Primary osteoarthritis of both hands 02/25/2018  . Chronic respiratory failure ([place]) 04/02/2018  . Breast asymmetry 06/21/2018  . Headache, new daily persistent ([facility]) 07/28/2018  . Hypertension 07/30/2018  . Anemia 12/20/2018  . Hyponatremia 05/04/2019  . Epigastric pain 05/04/2019  . Hypercapnia 06/28/2019  . Acute respiratory distress 06/28/2019  . RLS (restless legs syndrome) 06/28/2019  . Chronic daily headache 01/06/2020   Resolved Ambulatory Problems    Diagnosis Date Noted  . THRUSH 09/26/2010  . Abdominal bloating 03/04/2011  .  Chronic Bronchopleural fistula    Past Medical History:  Diagnosis Date  . CAD (coronary artery disease)   . Cancer ([place]) 2007  . COPD (chronic obstructive pulmonary disease) ([place]) 9/10  . Hurthle cell adenoma of thyroid 2007  . Iron deficiency anemia 2/08  . Peripheral vascular disease ([place])        Review of Systems See HPI.     Objective:   Physical Exam Vitals reviewed.  Constitutional:      Appearance: Normal appearance.  HENT:     Head: Normocephalic.  Mouth/Throat:     Mouth: Mucous membranes are moist.  Eyes:     General:        Right eye: No discharge.        Left eye: No discharge.     Extraocular Movements: Extraocular movements intact.     Conjunctiva/sclera: Conjunctivae normal.     Pupils: Pupils are equal, round, and reactive to light.  Cardiovascular:     Rate and Rhythm: Normal rate and regular rhythm.     Pulses: Normal  pulses.  Pulmonary:     Effort: Pulmonary effort is normal.     Comments: Decreased breath sounds.  On 2L of continuous O2.  Neurological:     General: No focal deficit present.     Mental Status: She is alert and oriented to person, place, and time.  Psychiatric:        Mood and Affect: Mood normal.          .. Depression screen [facility] 2/9 12/30/2019 09/20/2019 04/19/2019 12/17/2018 09/13/2018  Decreased Interest 3 3 0 2 3  Down, Depressed, Hopeless 3 2 1  0 -  PHQ - 2 Score 6 5 1 2 3   Altered sleeping 0 3 - 0 0  Tired, decreased energy 3 3 - 3 -  Change in appetite 3 1 - 3 3  Feeling bad or failure about yourself  3 3 - 0 3  Trouble concentrating 3 0 - 0 3  Moving slowly or fidgety/restless 3 3 - 0 0  Suicidal thoughts 0 0 - 0 0  PHQ-9 Score 21 18 - 8 12  Difficult doing work/chores - Very difficult - Not difficult at all Not difficult at all  Some recent data might be hidden  .Marland Kitchen GAD 7 : Generalized Anxiety Score 12/30/2019 09/20/2019 12/17/2018 09/13/2018  Nervous, Anxious, on Edge 3 3 0 3  Control/stop worrying 3 3 3 3   Worry too much - different things 3 3 3 3   Trouble relaxing 2 1 0 3  Restless 3 1 2 2   Easily annoyed or irritable 3 3 3 3   Afraid - awful might happen 3 0 2 3  Total GAD 7 Score 20 14 13 20   Anxiety Difficulty Very difficult Very difficult Not difficult at all Not difficult at all      Assessment & Plan:  Marland KitchenMarland KitchenLeonette was seen today for follow-up.  Diagnoses and all orders for this visit:  Moderate episode of recurrent major depressive disorder (HCC)  Epigastric pain -     omeprazole (PRILOSEC) 40 MG capsule; TAKE 1 CAPSULE(40 MG) BY MOUTH DAILY  Gastroesophageal reflux disease without esophagitis -     omeprazole (PRILOSEC) 40 MG capsule; TAKE 1 CAPSULE(40 MG) BY MOUTH DAILY  Vaccine counseling  HYPERTENSION, BENIGN SYSTEMIC  Chronic respiratory failure with hypoxia and hypercapnia (HCC)  Chronic daily headache -     topiramate (TOPAMAX) 25 MG  tablet; Take 1 tablet (25 mg total) by mouth 2 (two) times daily.   Refilled medications that needed to be refilled.   I suspect numerous causes for headache including variable blood pressure, hypoxic state, stress/depression. Could be atypical migraine. Start topamax.  Follow up in 4 weeks.      ..COVID-19 Vaccine Information can be found at: ShippingScam.co.uk For questions related to vaccine distribution or appointments, please email vaccine@[email or web address] or call [phone].   Strongly encouraged vaccine and given information to get shot.

## 2020-01-06 ENCOUNTER — Encounter: Payer: Self-pay | Admitting: Physician Assistant

## 2020-01-06 DIAGNOSIS — R519 Headache, unspecified: Secondary | ICD-10-CM | POA: Insufficient documentation

## 2020-01-30 ENCOUNTER — Ambulatory Visit (INDEPENDENT_AMBULATORY_CARE_PROVIDER_SITE_OTHER): Payer: Medicare Other | Admitting: Physician Assistant

## 2020-01-30 ENCOUNTER — Encounter: Payer: Self-pay | Admitting: Physician Assistant

## 2020-01-30 ENCOUNTER — Other Ambulatory Visit: Payer: Self-pay | Admitting: Physician Assistant

## 2020-01-30 VITALS — BP 164/68 | HR 65 | Ht 63.0 in | Wt 114.0 lb

## 2020-01-30 DIAGNOSIS — J9611 Chronic respiratory failure with hypoxia: Secondary | ICD-10-CM

## 2020-01-30 DIAGNOSIS — J9612 Chronic respiratory failure with hypercapnia: Secondary | ICD-10-CM | POA: Diagnosis not present

## 2020-01-30 DIAGNOSIS — I10 Essential (primary) hypertension: Secondary | ICD-10-CM | POA: Diagnosis not present

## 2020-01-30 DIAGNOSIS — Z7982 Long term (current) use of aspirin: Secondary | ICD-10-CM | POA: Diagnosis not present

## 2020-01-30 DIAGNOSIS — F418 Other specified anxiety disorders: Secondary | ICD-10-CM

## 2020-01-30 DIAGNOSIS — Z87891 Personal history of nicotine dependence: Secondary | ICD-10-CM

## 2020-01-30 DIAGNOSIS — R519 Headache, unspecified: Secondary | ICD-10-CM

## 2020-01-30 MED ORDER — QUETIAPINE FUMARATE 100 MG PO TABS: 100.0000 mg | ORAL_TABLET | Freq: Every day | ORAL | 1 refills | Status: DC

## 2020-01-30 MED ORDER — TOPIRAMATE 50 MG PO TABS: 50.0000 mg | ORAL_TABLET | Freq: Two times a day (BID) | ORAL | 1 refills | Status: DC

## 2020-01-30 MED ORDER — ALPRAZOLAM 1 MG PO TABS: 1.0000 mg | ORAL_TABLET | Freq: Every day | ORAL | 1 refills | Status: DC | PRN

## 2020-01-30 NOTE — Progress Notes (Deleted)
Headaches about the same Taking Topamax but hasn't noticed a difference BP today 184/83 HR 65 Patient will recheck in ten minutes and give [name] the reading Asking for refill on Xanax

## 2020-01-30 NOTE — Progress Notes (Signed)
   Subjective:    Patient ID: [name], female    DOB: November 04, 1945, 74 y.o.   MRN: [id number]  HPI   167/47 60   Frontal headache.   topamax and headache.    Review of Systems     Objective:   Physical Exam        Assessment & Plan:

## 2020-02-03 MED ORDER — ALPRAZOLAM 1 MG PO TABS: 1.0000 mg | ORAL_TABLET | Freq: Every day | ORAL | 1 refills | Status: DC | PRN

## 2020-02-03 NOTE — Progress Notes (Signed)
Patient ID: [name], female   DOB: 03-Jun-1946, 74 y.o.   MRN: [id number] .Marland KitchenVirtual Visit via Telephone Note  I connected with [name] on 01/30/2020 at  2:00 PM EDT by telephone and verified that I am speaking with the correct person using two identifiers.  Location: Patient: home Provider: clinic   I discussed the limitations, risks, security and privacy concerns of performing an evaluation and management service by telephone and the availability of in person appointments. I also discussed with the patient that there may be a patient responsible charge related to this service. The patient expressed understanding and agreed to proceed.   History of Present Illness: Patient is a 74 year old female with hypertension, chronic respiratory failure, chronic daily headache who calls into the clinic to follow-up on headache.  She has had this headache for over a year.  She reports that is daily chronic and frontal.  She denies any nausea, light sensitivity, sound sensitivity, vision changes with this headache.  She has had MRIs that were normal.  We started Topamax at last visit.  She has not found any benefit.  She continues to check her blood pressure readings regularly.  Her blood pressure is very variable.  Today is 184/83 with a heart rate of 65 but she can check it in 30 minutes to an hour and be much less.  Her readings can get as low as in the 120s over 70s.  Patient need refill on xanax.  .. Active Ambulatory Problems    Diagnosis Date Noted  . History of lung cancer 07/28/2006  . HYPOTHYROIDISM, POSTSURGICAL 05/19/2007  . HYPERCHOLESTEROLEMIA 07/28/2006  . ANEMIA, IRON DEFICIENCY NOS 01/04/2007  . Major depressive disorder, recurrent episode ([place]) 07/28/2006  . Anxiety state 07/28/2006  . HYPERTENSION, BENIGN SYSTEMIC 07/28/2006  . DISEASE, ISCHEMIC HEART, CHRONIC NOS 05/19/2007  . PERIPHERAL VASCULAR DISEASE, UNSPEC. 07/28/2006  . COPD with chronic bronchitis  07/28/2006  . Hypoxemia 06/22/2013  . History of hip fracture 09/02/2013  . Insomnia 09/04/2013  . GERD (gastroesophageal reflux disease) 09/04/2013  . Sensorineural hearing loss of both ears 11/18/2013  . Systolic murmur 40/98/1191  . Episode of abnormal behavior 11/28/2013  . Word finding difficulty 11/28/2013  . Memory loss 11/28/2013  . Transient cerebral ischemic attack 03/01/2014  . Carotid artery narrowing 02/27/2014  . Clinical depression 02/27/2014  . Cerebrovascular accident, old 06/14/2013  . IFG (impaired fasting glucose) 04/12/2015  . Elevated blood pressure 10/16/2015  . Split S2 (second heart sound) 10/16/2015  . Anxiety as acute reaction to exceptional stress 12/05/2015  . Muscle spasm of back 12/05/2015  . Memory changes 12/05/2015  . Osteopenia 04/30/2016  . Chronic pain of left ankle 12/16/2016  . Left foot pain 02/17/2017  . Cavus foot, acquired 04/28/2017  . Depression with anxiety 08/01/2017  . Lung cancer ([place]) 08/01/2017  . Exertional chest pain 01/26/2018  . Pulmonary embolism ([place]) 01/29/2018  . Venous stasis of lower extremity 02/25/2018  . Primary osteoarthritis of both hands 02/25/2018  . Chronic respiratory failure ([place]) 04/02/2018  . Breast asymmetry 06/21/2018  . Headache, new daily persistent ([facility]) 07/28/2018  . Hypertension 07/30/2018  . Anemia 12/20/2018  . Hyponatremia 05/04/2019  . Epigastric pain 05/04/2019  . Hypercapnia 06/28/2019  . Acute respiratory distress 06/28/2019  . RLS (restless legs syndrome) 06/28/2019  . Chronic daily headache 01/06/2020   Resolved Ambulatory Problems    Diagnosis Date Noted  . THRUSH 09/26/2010  . Abdominal bloating 03/04/2011  .  Chronic Bronchopleural  fistula    Past Medical History:  Diagnosis Date  . CAD (coronary artery disease)   . Cancer ([place]) 2007  . COPD (chronic obstructive pulmonary disease) ([place]) 9/10  . Hurthle cell adenoma of thyroid 2007  . Iron deficiency anemia 2/08  .  Peripheral vascular disease ([place])    Reviewed med, allergy, problem list.     Observations/Objective: No acute distress. Ongoing labored breaths of hair as she is on O2.   .. Today's Vitals   01/30/20 1333 01/30/20 1414  BP: (!) 184/83 (!) 164/68  Pulse: 65   Weight: 114 lb (51.7 kg)   Height: 5\' 3"  (1.6 m)    Body mass index is 20.19 kg/m.    Assessment and Plan: Marland KitchenMarland KitchenRaymona was seen today for hypertension and headache.  Diagnoses and all orders for this visit:  Chronic daily headache -     topiramate (TOPAMAX) 50 MG tablet; Take 1 tablet (50 mg total) by mouth 2 (two) times daily.  Depression with anxiety -     Discontinue: ALPRAZolam (XANAX) 1 MG tablet; Take 1 tablet (1 mg total) by mouth daily as needed for anxiety. -     QUEtiapine (SEROQUEL) 100 MG tablet; Take 1 tablet (100 mg total) by mouth at bedtime. -     Discontinue: ALPRAZolam (XANAX) 1 MG tablet; Take 1 tablet (1 mg total) by mouth daily as needed for anxiety. -     ALPRAZolam (XANAX) 1 MG tablet; Take 1 tablet (1 mg total) by mouth daily as needed for anxiety.  HYPERTENSION, BENIGN SYSTEMIC  Chronic respiratory failure with hypoxia and hypercapnia (HCC)   Her blood pressure is very variable.  The fear is if we treat her blood pressure it will drop too low at some points and lead to a fall.  Continue to use her inhalers and BiPAP machine at home.  I do think a lot of her blood pressure is due to her chronic respiratory failure.  Patient is ongoing headache persists.  Increase Topamax to 50 mg twice a day.  Offered to send her to a headache clinic.  She refused today.  Ok for xanax refills.   Follow up in 2 months.    Follow Up Instructions:    I discussed the assessment and treatment plan with the patient. The patient was provided an opportunity to ask questions and all were answered. The patient agreed with the plan and demonstrated an understanding of the instructions.   The patient was advised to  call back or seek an in-person evaluation if the symptoms worsen or if the condition fails to improve as anticipated.  I provided 15 minutes of non-face-to-face time during this encounter.   Iran Planas, PA-C

## 2020-02-21 ENCOUNTER — Encounter: Payer: Self-pay | Admitting: Cardiology

## 2020-02-21 ENCOUNTER — Telehealth (INDEPENDENT_AMBULATORY_CARE_PROVIDER_SITE_OTHER): Payer: Medicare Other | Admitting: Cardiology

## 2020-02-21 VITALS — BP 163/60 | HR 62 | Ht 63.0 in | Wt 114.0 lb

## 2020-02-21 DIAGNOSIS — Z85118 Personal history of other malignant neoplasm of bronchus and lung: Secondary | ICD-10-CM

## 2020-02-21 DIAGNOSIS — Z86711 Personal history of pulmonary embolism: Secondary | ICD-10-CM

## 2020-02-21 DIAGNOSIS — J449 Chronic obstructive pulmonary disease, unspecified: Secondary | ICD-10-CM

## 2020-02-21 DIAGNOSIS — J4489 Other specified chronic obstructive pulmonary disease: Secondary | ICD-10-CM

## 2020-02-21 DIAGNOSIS — I10 Essential (primary) hypertension: Secondary | ICD-10-CM | POA: Diagnosis not present

## 2020-02-21 NOTE — Patient Instructions (Signed)
Medication Instructions:  Continue current medications  *If you need a refill on your cardiac medications before your next appointment, please call your pharmacy*   Lab Work: None Ordered   Testing/Procedures: None Ordered   Follow-Up: At [facility], you and your health needs are our priority.  As part of our continuing mission to provide you with exceptional heart care, we have created designated Provider Care Teams.  These Care Teams include your primary Cardiologist (physician) and Advanced Practice Providers (APPs -  Physician Assistants and Nurse Practitioners) who all work together to provide you with the care you need, when you need it.  We recommend signing up for the patient portal called "MyChart".  Sign up information is provided on this After Visit Summary.  MyChart is used to connect with patients for Virtual Visits (Telemedicine).  Patients are able to view lab/test results, encounter notes, upcoming appointments, etc.  Non-urgent messages can be sent to your provider as well.   To learn more about what you can do with MyChart, go to [email or web address].    Your next appointment:   1 year(s)  The format for your next appointment:   In Person  Provider:   You may see Kirk Ruths, MD or one of the following Advanced Practice Providers on your designated Care Team:    Kerin Ransom, PA-C  [place], [place]  Coletta Memos, [place]

## 2020-02-21 NOTE — Progress Notes (Signed)
Virtual Visit via Telephone Note   This visit type was conducted due to national recommendations for restrictions regarding the COVID-19 Pandemic (e.g. social distancing) in an effort to limit this patient's exposure and mitigate transmission in our community.  Due to her co-morbid illnesses, this patient is at least at moderate risk for complications without adequate follow up.  This format is felt to be most appropriate for this patient at this time.  The patient did not have access to video technology/had technical difficulties with video requiring transitioning to audio format only (telephone).  All issues noted in this document were discussed and addressed.  No physical exam could be performed with this format.  Please refer to the patient's chart for her  consent to telehealth for [facility].   The patient was identified using 2 identifiers.  Date:  02/21/2020   ID:  [name], DOB June 10, 1946, MRN [id number]  Patient Location: Home Provider Location: Home  PCP:  Donella Stade, PA-C  Cardiologist:  Dr Stanford Breed Electrophysiologist:  None   Evaluation Performed:  Follow-Up Visit  Chief Complaint:  none  History of Present Illness:    [name] is a 74 y.o. female with a history of a pulmonary embolism in April 2019.  She tells me there was no apparent provoking factor although she does have a history of lung cancer and COPD.  She had a remote left pneumonectomy in 2007.  She was placed on Eliquis for 6 months.  Echocardiogram in April 2019 showed normal LV function, aortic valve sclerosis, and normal PA pressures.  She does have a history of hypertension and hypothyroidism.  She was contacted today for routine follow-up.  Her last office visit with Dr. Stanford Breed was in February 2020.  She is done well since we saw her last.  She denies any unusual chest pain.  She has some chronic shortness of breath due to her COPD but nothing unusual for her.  The patient  does not have symptoms concerning for COVID-19 infection (fever, chills, cough, or new shortness of breath).    Past Medical History:  Diagnosis Date  . Bronchopleural fistula ([place])   . CAD (coronary artery disease)    20% stenosis on cath - no intervention required (W-S cards), negative nuclear stress test 11/07  . Cancer [facility]) 2007   lung (Dr. Earlie Server and Dr. Arlyce Dice)  . COPD (chronic obstructive pulmonary disease) ([place]) 9/10   golds stage III, FeV1 39%  . Hurthle cell adenoma of thyroid 2007  . Hypertension   . Iron deficiency anemia 2/08   s/p 2 unit transfusion  . Lung cancer ([place])   . Peripheral vascular disease ([place])   . RLS (restless legs syndrome)    Past Surgical History:  Procedure Laterality Date  . APPENDECTOMY  18  . CHOLECYSTECTOMY  18  . heart cartherization    . LUL  2005   LUL wedge resection/VATS  . LUL  4/08   LUL lobectomy for cystic cavity and Candida, no cancer seen  . THYROIDECTOMY, PARTIAL  2007   Dr. Arlyce Dice     Current Meds  Medication Sig  . albuterol (PROVENTIL HFA;VENTOLIN HFA) 108 (90 Base) MCG/ACT inhaler Inhale 2 puffs into the lungs every 6 (six) hours as needed for wheezing or shortness of breath.  . ALPRAZolam (XANAX) 1 MG tablet Take 1 tablet (1 mg total) by mouth daily as needed for anxiety.  . AMBULATORY NON FORMULARY MEDICATION Hypoxia with positive walk test. O2  2L as needed for SOB and decreased O2 stats with exertion. Pt request portable O2 tanks only.  Marland Kitchen aspirin EC 81 MG tablet Take 81 mg by mouth daily.  Marland Kitchen atorvastatin (LIPITOR) 20 MG tablet Take 1 tablet (20 mg total) by mouth at bedtime. take 1 tablet by mouth once daily  . cloNIDine (CATAPRES - DOSED IN MG/24 HR) 0.3 mg/24hr patch Place 1 patch (0.3 mg total) onto the skin once a week.  . ferrous sulfate 325 (65 FE) MG EC tablet Take 1 tablet (325 mg total) by mouth 2 (two) times daily with a meal.  . Fluticasone-Umeclidin-Vilant (TRELEGY ELLIPTA) 100-62.5-25 MCG/INH AEPB Inhale  1 puff into the lungs daily.  Marland Kitchen ipratropium-albuterol (DUONEB) 0.5-2.5 (3) MG/3ML SOLN Take 3 mLs by nebulization every 4 (four) hours as needed.  . labetalol (NORMODYNE) 200 MG tablet Take 2 tablets (400 mg total) by mouth 2 (two) times daily.  Marland Kitchen levothyroxine (SYNTHROID) 50 MCG tablet take 1 tablet by mouth once daily  . omeprazole (PRILOSEC) 40 MG capsule TAKE 1 CAPSULE(40 MG) BY MOUTH DAILY  . QUEtiapine (SEROQUEL) 100 MG tablet Take 1 tablet (100 mg total) by mouth at bedtime.  Marland Kitchen spironolactone (ALDACTONE) 25 MG tablet Take 1 tablet (25 mg total) by mouth daily.  Marland Kitchen topiramate (TOPAMAX) 50 MG tablet Take 1 tablet (50 mg total) by mouth 2 (two) times daily.  . valsartan (DIOVAN) 320 MG tablet Take 1 tablet (320 mg total) by mouth daily.  Marland Kitchen vortioxetine HBr (TRINTELLIX) 20 MG TABS tablet Take 1 tablet (20 mg total) by mouth daily.     Allergies:   Desvenlafaxine succinate er, Lisinopril-hydrochlorothiazide, Nitrofurantoin monohyd macro, Norvasc [amlodipine], Penicillins, and Zoloft [sertraline hcl]   Social History   Tobacco Use  . Smoking status: Former Smoker    Packs/day: 1.50    Years: 30.00    Pack years: 45.00    Types: Cigarettes    Quit date: 10/20/2004    Years since quitting: 15.3  . Smokeless tobacco: Never Used  Substance Use Topics  . Alcohol use: No  . Drug use: No     Family Hx: The patient's family history includes Cancer in her father and mother; Cancer (age of onset: 20) in her sister; Diabetes in her sister; Hypertension in her father; Multiple sclerosis in her sister.  ROS:   Please see the history of present illness.    All other systems reviewed and are negative.   Prior CV studies:   The following studies were reviewed today:  Echo April 2019- Study Conclusions   - Left ventricle: The cavity size was normal. Systolic function was  normal. The estimated ejection fraction was in the range of 60%  to 65%. Wall motion was normal; there were no  regional wall  motion abnormalities. Left ventricular diastolic function  parameters were normal.  - Aortic valve: Nodular calcification of the non coronary cusp.  - Atrial septum: No defect or patent foramen ovale was identified.  Labs/Other Tests and Data Reviewed:    EKG:  An ECG dated 09/08/2018 was personally reviewed today and demonstrated:  NSR, HR 78, RAD, Q V2  Recent Labs: 04/28/2019: TSH 3.30 06/21/2019: Magnesium 1.9 09/20/2019: ALT 10; BUN 20; Creat 0.66; Hemoglobin 8.9; Platelets 255; Potassium 4.5; Sodium 135   Recent Lipid Panel Lab Results  Component Value Date/Time   CHOL 141 09/20/2018 11:06 AM   TRIG 72 09/20/2018 11:06 AM   HDL 70 09/20/2018 11:06 AM   CHOLHDL 2.0 09/20/2018 11:06  AM   LDLCALC 56 09/20/2018 11:06 AM   LDLDIRECT 98 02/18/2007 12:21 AM    Wt Readings from Last 3 Encounters:  02/21/20 114 lb (51.7 kg)  01/30/20 114 lb (51.7 kg)  12/30/19 114 lb (51.7 kg)     Objective:    Vital Signs:  BP (!) 163/60   Pulse 62   Ht 5\' 3"  (1.6 m)   Wt 114 lb (51.7 kg)   BMI 20.19 kg/m    VITAL SIGNS:  reviewed  ASSESSMENT & PLAN:    HTN- Borderline control-on multiple medications. It looks like this has been a persistent problem.  Consider the addition on Amlodipine- f/u with PCP.  H/O PE- April 2019- she was treated with Eliquis for (?) 6 months.   COPD- H/O COPD and lung Ca  Plan:  F/U B/P with her PCP- consider the addition of Amlodipine 5 mg if needed.   I offered her a PRN follow up but she would like to see Dr Stanford Breed in a year and this will be arranged.   COVID-19 Education: The signs and symptoms of COVID-19 were discussed with the patient and how to seek care for testing (follow up with PCP or arrange E-visit).  The importance of social distancing was discussed today.  Time:   Today, I have spent 15 minutes with the patient with telehealth technology discussing the above problems.     Medication Adjustments/Labs and Tests  Ordered: Current medicines are reviewed at length with the patient today.  Concerns regarding medicines are outlined above.   Tests Ordered: No orders of the defined types were placed in this encounter.   Medication Changes: No orders of the defined types were placed in this encounter.   Follow Up:  In Person Dr Stanford Breed in one year  Signed, Kerin Ransom, [place]  02/21/2020 2:19 PM    [place]

## 2020-03-20 ENCOUNTER — Ambulatory Visit (INDEPENDENT_AMBULATORY_CARE_PROVIDER_SITE_OTHER): Payer: Medicare Other | Admitting: Physician Assistant

## 2020-03-20 ENCOUNTER — Other Ambulatory Visit: Payer: Self-pay

## 2020-03-20 ENCOUNTER — Encounter: Payer: Self-pay | Admitting: Physician Assistant

## 2020-03-20 VITALS — BP 141/55 | HR 73 | Ht 63.0 in | Wt 109.0 lb

## 2020-03-20 DIAGNOSIS — J449 Chronic obstructive pulmonary disease, unspecified: Secondary | ICD-10-CM

## 2020-03-20 DIAGNOSIS — J9611 Chronic respiratory failure with hypoxia: Secondary | ICD-10-CM | POA: Diagnosis not present

## 2020-03-20 DIAGNOSIS — F419 Anxiety disorder, unspecified: Secondary | ICD-10-CM

## 2020-03-20 DIAGNOSIS — M130 Polyarthritis, unspecified: Secondary | ICD-10-CM

## 2020-03-20 DIAGNOSIS — I10 Essential (primary) hypertension: Secondary | ICD-10-CM

## 2020-03-20 DIAGNOSIS — F331 Major depressive disorder, recurrent, moderate: Secondary | ICD-10-CM

## 2020-03-20 DIAGNOSIS — F330 Major depressive disorder, recurrent, mild: Secondary | ICD-10-CM | POA: Diagnosis not present

## 2020-03-20 DIAGNOSIS — J9612 Chronic respiratory failure with hypercapnia: Secondary | ICD-10-CM

## 2020-03-20 DIAGNOSIS — Z1211 Encounter for screening for malignant neoplasm of colon: Secondary | ICD-10-CM

## 2020-03-20 DIAGNOSIS — J4489 Other specified chronic obstructive pulmonary disease: Secondary | ICD-10-CM

## 2020-03-20 MED ORDER — CELECOXIB 200 MG PO CAPS: 200.0000 mg | ORAL_CAPSULE | Freq: Every day | ORAL | 1 refills | Status: DC

## 2020-03-20 MED ORDER — VORTIOXETINE HBR 20 MG PO TABS: 20.0000 mg | ORAL_TABLET | Freq: Every day | ORAL | 0 refills | Status: DC

## 2020-03-20 MED ORDER — BUSPIRONE HCL 5 MG PO TABS: 5.0000 mg | ORAL_TABLET | Freq: Three times a day (TID) | ORAL | 1 refills | Status: DC

## 2020-03-20 NOTE — Progress Notes (Signed)
Subjective:    Patient ID: [name], female    DOB: 05-16-1946, 74 y.o.   MRN: [id number]  HPI  Pt is a 74 yo female with COPD, chronic respiratory failure on continuous O2, HTN, hx of CVA, PE, and lung cancer who presents to the clinic for 6 month follow up.   Patient is doing well today.  She is stable on her medications.  She just recently saw cardiology.  They would like to keep a close watch on her blood pressure.  She does not see pulmonology regularly.  It is hard to get in with them.  She is on continuous 2 L of oxygen along with trilogy BiPAP machine at night at 4 L of oxygen.  She complains about her mood.  She feels like her health condition limits her so much. She wants to go on trips and can't. She feels like she cannot leave the house. Her sister recently passed away and she thinks about that often. She feels anxious a lot.   .. Active Ambulatory Problems    Diagnosis Date Noted  . History of lung cancer 07/28/2006  . HYPOTHYROIDISM, POSTSURGICAL 05/19/2007  . HYPERCHOLESTEROLEMIA 07/28/2006  . ANEMIA, IRON DEFICIENCY NOS 01/04/2007  . Major depressive disorder, recurrent episode ([place]) 07/28/2006  . Anxiety state 07/28/2006  . DISEASE, ISCHEMIC HEART, CHRONIC NOS 05/19/2007  . PERIPHERAL VASCULAR DISEASE, UNSPEC. 07/28/2006  . COPD with chronic bronchitis 07/28/2006  . Hypoxemia 06/22/2013  . History of hip fracture 09/02/2013  . Insomnia 09/04/2013  . GERD (gastroesophageal reflux disease) 09/04/2013  . Sensorineural hearing loss of both ears 11/18/2013  . Systolic murmur 00/17/4944  . Episode of abnormal behavior 11/28/2013  . Word finding difficulty 11/28/2013  . Memory loss 11/28/2013  . Transient cerebral ischemic attack 03/01/2014  . Carotid artery narrowing 02/27/2014  . Clinical depression 02/27/2014  . Cerebrovascular accident, old 06/14/2013  . IFG (impaired fasting glucose) 04/12/2015  . Elevated blood pressure 10/16/2015  . Split S2  (second heart sound) 10/16/2015  . Anxiety as acute reaction to exceptional stress 12/05/2015  . Muscle spasm of back 12/05/2015  . Memory changes 12/05/2015  . Osteopenia 04/30/2016  . Chronic pain of left ankle 12/16/2016  . Left foot pain 02/17/2017  . Cavus foot, acquired 04/28/2017  . Depression with anxiety 08/01/2017  . Lung cancer ([place]) 08/01/2017  . Exertional chest pain 01/26/2018  . History of pulmonary embolus (PE) 01/29/2018  . Venous stasis of lower extremity 02/25/2018  . Primary osteoarthritis of both hands 02/25/2018  . Chronic respiratory failure ([place]) 04/02/2018  . Breast asymmetry 06/21/2018  . Headache, new daily persistent ([facility]) 07/28/2018  . Essential hypertension 07/30/2018  . Anemia 12/20/2018  . Hyponatremia 05/04/2019  . Epigastric pain 05/04/2019  . Hypercapnia 06/28/2019  . RLS (restless legs syndrome) 06/28/2019  . Chronic daily headache 01/06/2020   Resolved Ambulatory Problems    Diagnosis Date Noted  . THRUSH 09/26/2010  . Abdominal bloating 03/04/2011  .  Chronic Bronchopleural fistula   . Acute respiratory distress 06/28/2019   Past Medical History:  Diagnosis Date  . CAD (coronary artery disease)   . Cancer ([place]) 2007  . COPD (chronic obstructive pulmonary disease) ([place]) 9/10  . Hurthle cell adenoma of thyroid 2007  . Hypertension   . Iron deficiency anemia 2/08  . Peripheral vascular disease ([place])       Review of Systems  All other systems reviewed and are negative.      Objective:  Physical Exam Vitals reviewed.  Constitutional:      Appearance: Normal appearance.  HENT:     Head: Normocephalic.  Cardiovascular:     Rate and Rhythm: Normal rate and regular rhythm.     Heart sounds: Murmur present.  Pulmonary:     Effort: Pulmonary effort is normal.     Breath sounds: Normal breath sounds.  Neurological:     General: No focal deficit present.     Mental Status: She is alert and oriented to person, place, and time.   Psychiatric:        Mood and Affect: Mood normal.     .. Depression screen [facility] 2/9 03/20/2020 12/30/2019 09/20/2019 04/19/2019 12/17/2018  Decreased Interest 3 3 3  0 2  Down, Depressed, Hopeless 3 3 2 1  0  PHQ - 2 Score 6 6 5 1 2   Altered sleeping 1 0 3 - 0  Tired, decreased energy 3 3 3  - 3  Change in appetite 3 3 1  - 3  Feeling bad or failure about yourself  3 3 3  - 0  Trouble concentrating 2 3 0 - 0  Moving slowly or fidgety/restless 3 3 3  - 0  Suicidal thoughts 0 0 0 - 0  PHQ-9 Score 21 21 18  - 8  Difficult doing work/chores Very difficult - Very difficult - Not difficult at all  Some recent data might be hidden   .Marland Kitchen GAD 7 : Generalized Anxiety Score 03/20/2020 12/30/2019 09/20/2019 12/17/2018  Nervous, Anxious, on Edge 3 3 3  0  Control/stop worrying 3 3 3 3   Worry too much - different things 3 3 3 3   Trouble relaxing 3 2 1  0  Restless 2 3 1 2   Easily annoyed or irritable 3 3 3 3   Afraid - awful might happen 3 3 0 2  Total GAD 7 Score 20 20 14 13   Anxiety Difficulty Very difficult Very difficult Very difficult Not difficult at all          Assessment & Plan:  Marland KitchenMarland KitchenAnjani was seen today for follow-up.  Diagnoses and all orders for this visit:  Mild episode of recurrent major depressive disorder (HCC) -     vortioxetine HBr (TRINTELLIX) 20 MG TABS tablet; Take 1 tablet (20 mg total) by mouth daily.  Colon cancer screening -     Cologuard  COPD with chronic bronchitis  Chronic respiratory failure with hypoxia and hypercapnia (HCC)  Essential hypertension  Moderate episode of recurrent major depressive disorder (HCC)  Anxiety -     busPIRone (BUSPAR) 5 MG tablet; Take 1 tablet (5 mg total) by mouth 3 (three) times daily.  Arthritis of multiple sites -     celecoxib (CELEBREX) 200 MG capsule; Take 1 capsule (200 mg total) by mouth daily.   PHQ 9 and GAD 7 were elevated today.  Patient feels like most of her mood is related to her chronic diseases.  She feels very  limited in what she can do.  Her whole family is going to [place] for beach vacation and she cannot go.  She feels like she is trapped to the house even with the portable oxygen.  She feels like it is hard to relax and uneasy at times.  She denies any suicidal thoughts or homicidal idealizations.  I would like to add BuSpar up to 3 times a day to her Trintellix.  We will see if this helps with any of her anxiety.  BP controlled today.   Breathing stable today.  On continuous O2.  Uses trilogy BiPAP at night.  Multiple joint pain. Used celebrex in the past and worked great. GFR great on last check. Sent to pharmacy. Follow up as needed.   Sent cologuard for colon cancer screening.

## 2020-03-21 ENCOUNTER — Encounter: Payer: Self-pay | Admitting: Physician Assistant

## 2020-03-21 ENCOUNTER — Telehealth: Payer: Self-pay | Admitting: Neurology

## 2020-03-21 DIAGNOSIS — I10 Essential (primary) hypertension: Secondary | ICD-10-CM

## 2020-03-21 NOTE — Telephone Encounter (Signed)
Cologuard order faxed to [phone] with confirmation received. They will contact the patient directly.   

## 2020-03-26 ENCOUNTER — Telehealth: Payer: Self-pay

## 2020-03-26 NOTE — Telephone Encounter (Signed)
I agree with plan. We don't want you on something you cannot tolerate.

## 2020-03-26 NOTE — Telephone Encounter (Signed)
[name] states she stopped the Buspar on Friday due to all the side effects. Shakes, upset stomach, dizziness, headaches, sweating and drowsiness. Most of the symptoms have resolved. She states she is not going to restart the medication. She still has some shakes and upset stomach.

## 2020-03-27 NOTE — Telephone Encounter (Signed)
Patient advised.

## 2020-03-30 DIAGNOSIS — S81011A Laceration without foreign body, right knee, initial encounter: Secondary | ICD-10-CM | POA: Diagnosis not present

## 2020-03-30 DIAGNOSIS — R609 Edema, unspecified: Secondary | ICD-10-CM | POA: Diagnosis not present

## 2020-03-30 DIAGNOSIS — I10 Essential (primary) hypertension: Secondary | ICD-10-CM | POA: Diagnosis not present

## 2020-03-30 DIAGNOSIS — Z888 Allergy status to other drugs, medicaments and biological substances status: Secondary | ICD-10-CM | POA: Diagnosis not present

## 2020-03-30 DIAGNOSIS — K219 Gastro-esophageal reflux disease without esophagitis: Secondary | ICD-10-CM | POA: Diagnosis not present

## 2020-03-30 DIAGNOSIS — Z85118 Personal history of other malignant neoplasm of bronchus and lung: Secondary | ICD-10-CM | POA: Diagnosis not present

## 2020-03-30 DIAGNOSIS — Z23 Encounter for immunization: Secondary | ICD-10-CM | POA: Diagnosis not present

## 2020-03-30 DIAGNOSIS — J449 Chronic obstructive pulmonary disease, unspecified: Secondary | ICD-10-CM | POA: Diagnosis not present

## 2020-03-30 DIAGNOSIS — Z880 Allergy status to penicillin: Secondary | ICD-10-CM | POA: Diagnosis not present

## 2020-03-30 DIAGNOSIS — E039 Hypothyroidism, unspecified: Secondary | ICD-10-CM | POA: Diagnosis not present

## 2020-03-30 DIAGNOSIS — Z87891 Personal history of nicotine dependence: Secondary | ICD-10-CM | POA: Diagnosis not present

## 2020-03-30 DIAGNOSIS — Z8673 Personal history of transient ischemic attack (TIA), and cerebral infarction without residual deficits: Secondary | ICD-10-CM | POA: Diagnosis not present

## 2020-03-30 DIAGNOSIS — Z79899 Other long term (current) drug therapy: Secondary | ICD-10-CM | POA: Diagnosis not present

## 2020-03-30 DIAGNOSIS — S8001XA Contusion of right knee, initial encounter: Secondary | ICD-10-CM | POA: Diagnosis not present

## 2020-04-12 ENCOUNTER — Other Ambulatory Visit: Payer: Self-pay

## 2020-04-25 DIAGNOSIS — Z1211 Encounter for screening for malignant neoplasm of colon: Secondary | ICD-10-CM | POA: Diagnosis not present

## 2020-04-25 LAB — COLOGUARD

## 2020-04-26 LAB — COLOGUARD: Cologuard: NEGATIVE

## 2020-05-10 MED ORDER — TRELEGY ELLIPTA 100-62.5-25 MCG/INH IN AEPB: 1.0000 | INHALATION_SPRAY | Freq: Every day | RESPIRATORY_TRACT | 0 refills | Status: DC

## 2020-05-10 MED ORDER — CLONIDINE HCL 0.3 MG/24HR TD PTWK: 0.3000 mg | MEDICATED_PATCH | TRANSDERMAL | 12 refills | Status: DC

## 2020-05-10 NOTE — Telephone Encounter (Signed)
Patient made aware Cologuard negative. Abstracted.   Also needs some prescriptions sent. Done.

## 2020-05-10 NOTE — Addendum Note (Signed)
Addended byAnnamaria Helling on: 05/10/2020 02:37 PM   Modules accepted: Orders

## 2020-06-20 ENCOUNTER — Ambulatory Visit: Payer: Medicare Other | Admitting: Physician Assistant

## 2020-07-20 ENCOUNTER — Encounter: Payer: Self-pay | Admitting: Physician Assistant

## 2020-07-20 ENCOUNTER — Ambulatory Visit (INDEPENDENT_AMBULATORY_CARE_PROVIDER_SITE_OTHER): Payer: Medicare Other | Admitting: Physician Assistant

## 2020-07-20 VITALS — BP 159/43 | HR 69 | Ht 63.0 in | Wt 104.0 lb

## 2020-07-20 DIAGNOSIS — J449 Chronic obstructive pulmonary disease, unspecified: Secondary | ICD-10-CM

## 2020-07-20 DIAGNOSIS — E785 Hyperlipidemia, unspecified: Secondary | ICD-10-CM | POA: Diagnosis not present

## 2020-07-20 DIAGNOSIS — D638 Anemia in other chronic diseases classified elsewhere: Secondary | ICD-10-CM

## 2020-07-20 DIAGNOSIS — Z23 Encounter for immunization: Secondary | ICD-10-CM

## 2020-07-20 DIAGNOSIS — I10 Essential (primary) hypertension: Secondary | ICD-10-CM | POA: Diagnosis not present

## 2020-07-20 DIAGNOSIS — E890 Postprocedural hypothyroidism: Secondary | ICD-10-CM

## 2020-07-20 DIAGNOSIS — F418 Other specified anxiety disorders: Secondary | ICD-10-CM

## 2020-07-20 DIAGNOSIS — F330 Major depressive disorder, recurrent, mild: Secondary | ICD-10-CM | POA: Diagnosis not present

## 2020-07-20 MED ORDER — LABETALOL HCL 200 MG PO TABS: 400.0000 mg | ORAL_TABLET | Freq: Two times a day (BID) | ORAL | 1 refills | Status: DC

## 2020-07-20 MED ORDER — ALPRAZOLAM 1 MG PO TABS: 1.0000 mg | ORAL_TABLET | Freq: Every day | ORAL | 1 refills | Status: DC | PRN

## 2020-07-20 MED ORDER — IPRATROPIUM-ALBUTEROL 0.5-2.5 (3) MG/3ML IN SOLN: 3.0000 mL | RESPIRATORY_TRACT | 1 refills | Status: DC | PRN

## 2020-07-20 MED ORDER — VORTIOXETINE HBR 20 MG PO TABS: 20.0000 mg | ORAL_TABLET | Freq: Every day | ORAL | 1 refills | Status: DC

## 2020-07-20 MED ORDER — CLONIDINE HCL 0.3 MG/24HR TD PTWK: 0.3000 mg | MEDICATED_PATCH | TRANSDERMAL | 3 refills | Status: DC

## 2020-07-20 MED ORDER — LEVOTHYROXINE SODIUM 50 MCG PO TABS: ORAL_TABLET | ORAL | 3 refills | Status: DC

## 2020-07-20 MED ORDER — VALSARTAN 320 MG PO TABS: 320.0000 mg | ORAL_TABLET | Freq: Every day | ORAL | 3 refills | Status: DC

## 2020-07-20 NOTE — Progress Notes (Signed)
Subjective:    Patient ID: [name], female    DOB: Aug 02, 1946, 74 y.o.   MRN: [id number]  HPI  Pt is a 74 yo female with HTN, COPD, chronic respiratory failure, hypothyroidism MDD who presents to the clinic for follow up. She is accompanied by her granddaughter.   Her husband died of covid 2 weeks ago. She is devastated. She was not as healthy as her husband and always thought she would die first.   She is taking her medication. Breathing is ok. She uses BiPAP at night.   She denies any CP, palpitations, headaches, vision changes.   .. Active Ambulatory Problems    Diagnosis Date Noted  . History of lung cancer 07/28/2006  . HYPOTHYROIDISM, POSTSURGICAL 05/19/2007  . HYPERCHOLESTEROLEMIA 07/28/2006  . ANEMIA, IRON DEFICIENCY NOS 01/04/2007  . Major depressive disorder, recurrent episode ([place]) 07/28/2006  . Anxiety state 07/28/2006  . DISEASE, ISCHEMIC HEART, CHRONIC NOS 05/19/2007  . PERIPHERAL VASCULAR DISEASE, UNSPEC. 07/28/2006  . COPD with chronic bronchitis 07/28/2006  . Hypoxemia 06/22/2013  . History of hip fracture 09/02/2013  . Insomnia 09/04/2013  . GERD (gastroesophageal reflux disease) 09/04/2013  . Sensorineural hearing loss of both ears 11/18/2013  . Systolic murmur 40/98/1191  . Episode of abnormal behavior 11/28/2013  . Word finding difficulty 11/28/2013  . Memory loss 11/28/2013  . Transient cerebral ischemic attack 03/01/2014  . Carotid artery narrowing 02/27/2014  . Clinical depression 02/27/2014  . Cerebrovascular accident, old 06/14/2013  . IFG (impaired fasting glucose) 04/12/2015  . Elevated blood pressure 10/16/2015  . Split S2 (second heart sound) 10/16/2015  . Anxiety as acute reaction to exceptional stress 12/05/2015  . Muscle spasm of back 12/05/2015  . Memory changes 12/05/2015  . Osteopenia 04/30/2016  . Chronic pain of left ankle 12/16/2016  . Left foot pain 02/17/2017  . Cavus foot, acquired 04/28/2017  . Depression with  anxiety 08/01/2017  . Lung cancer ([place]) 08/01/2017  . Exertional chest pain 01/26/2018  . History of pulmonary embolus (PE) 01/29/2018  . Venous stasis of lower extremity 02/25/2018  . Primary osteoarthritis of both hands 02/25/2018  . Chronic respiratory failure ([place]) 04/02/2018  . Breast asymmetry 06/21/2018  . Headache, new daily persistent ([facility]) 07/28/2018  . Essential hypertension, benign 07/30/2018  . Anemia of chronic disease 12/20/2018  . Hyponatremia 05/04/2019  . Epigastric pain 05/04/2019  . Hypercapnia 06/28/2019  . RLS (restless legs syndrome) 06/28/2019  . Chronic daily headache 01/06/2020  . Dyslipidemia (high LDL; low HDL) 07/27/2020   Resolved Ambulatory Problems    Diagnosis Date Noted  . THRUSH 09/26/2010  . Abdominal bloating 03/04/2011  .  Chronic Bronchopleural fistula   . Acute respiratory distress 06/28/2019   Past Medical History:  Diagnosis Date  . CAD (coronary artery disease)   . Cancer ([place]) 2007  . COPD (chronic obstructive pulmonary disease) ([place]) 9/10  . Hurthle cell adenoma of thyroid 2007  . Hypertension   . Iron deficiency anemia 2/08  . Peripheral vascular disease ([place])        Review of Systems  All other systems reviewed and are negative.      Objective:   Physical Exam Vitals reviewed.  Constitutional:      Comments: Frail appearance.   Cardiovascular:     Rate and Rhythm: Normal rate and regular rhythm.     Pulses: Normal pulses.  Pulmonary:     Effort: Pulmonary effort is normal.     Comments: On 2L of O2.  Neurological:     General: No focal deficit present.     Mental Status: She is alert and oriented to person, place, and time.  Psychiatric:     Comments: flat    .Marland Kitchen Depression screen [facility] 2/9 03/20/2020 12/30/2019 09/20/2019 04/19/2019 12/17/2018  Decreased Interest 3 3 3  0 2  Down, Depressed, Hopeless 3 3 2 1  0  PHQ - 2 Score 6 6 5 1 2   Altered sleeping 1 0 3 - 0  Tired, decreased energy 3 3 3  - 3  Change in  appetite 3 3 1  - 3  Feeling bad or failure about yourself  3 3 3  - 0  Trouble concentrating 2 3 0 - 0  Moving slowly or fidgety/restless 3 3 3  - 0  Suicidal thoughts 0 0 0 - 0  PHQ-9 Score 21 21 18  - 8  Difficult doing work/chores Very difficult - Very difficult - Not difficult at all  Some recent data might be hidden   . GAD 7 : Generalized Anxiety Score 03/20/2020 12/30/2019 09/20/2019 12/17/2018  Nervous, Anxious, on Edge 3 3 3  0  Control/stop worrying 3 3 3 3   Worry too much - different things 3 3 3 3   Trouble relaxing 3 2 1  0  Restless 2 3 1 2   Easily annoyed or irritable 3 3 3 3   Afraid - awful might happen 3 3 0 2  Total GAD 7 Score 20 20 14 13   Anxiety Difficulty Very difficult Very difficult Very difficult Not difficult at all    . GAD 7 : Generalized Anxiety Score 03/20/2020 12/30/2019 09/20/2019 12/17/2018  Nervous, Anxious, on Edge 3 3 3  0  Control/stop worrying 3 3 3 3   Worry too much - different things 3 3 3 3   Trouble relaxing 3 2 1  0  Restless 2 3 1 2   Easily annoyed or irritable 3 3 3 3   Afraid - awful might happen 3 3 0 2  Total GAD 7 Score 20 20 14 13   Anxiety Difficulty Very difficult Very difficult Very difficult Not difficult at all           Assessment & Plan:  Marland KitchenMarland KitchenItzelle was seen today for follow-up.  Diagnoses and all orders for this visit:  COPD with chronic bronchitis -     ipratropium-albuterol (DUONEB) 0.5-2.5 (3) MG/3ML SOLN; Take 3 mLs by nebulization every 4 (four) hours as needed.  Flu vaccine need -     Flu Vaccine QUAD High Dose([facility])  Depression with anxiety -     Discontinue: ALPRAZolam (XANAX) 1 MG tablet; Take 1 tablet (1 mg total) by mouth daily as needed for anxiety. -     ALPRAZolam (XANAX) 1 MG tablet; Take 1 tablet (1 mg total) by mouth daily as needed for anxiety.  HYPERTENSION, BENIGN SYSTEMIC -     valsartan (DIOVAN) 320 MG tablet; Take 1 tablet (320 mg total) by mouth daily. -     COMPLETE METABOLIC PANEL WITH  GFR  HYPOTHYROIDISM, POSTSURGICAL -     levothyroxine (SYNTHROID) 50 MCG tablet; take 1 tablet by mouth once daily -     TSH  Uncontrolled stage 2 hypertension -     Discontinue: cloNIDine (CATAPRES - DOSED IN MG/24 HR) 0.3 mg/24hr patch; Place 1 patch (0.3 mg total) onto the skin once a week. -     labetalol (NORMODYNE) 200 MG tablet; Take 2 tablets (400 mg total) by mouth 2 (two) times daily. -     cloNIDine (CATAPRES -  DOSED IN MG/24 HR) 0.3 mg/24hr patch; Place 1 patch (0.3 mg total) onto the skin once a week.  Mild episode of recurrent major depressive disorder (HCC) -     vortioxetine HBr (TRINTELLIX) 20 MG TABS tablet; Take 1 tablet (20 mg total) by mouth daily.  Anemia of chronic disease -     CBC with Differential/Platelet  Dyslipidemia (high LDL; low HDL) -     Lipid Panel w/reflex Direct LDL   Vitals are stable.  BP elevated today. Pt keeps check on it at home.  Medications refilled.  Fasting labs ordered.  Vaccines UTD with covid booster.  Follow up in 6 months.

## 2020-07-27 ENCOUNTER — Encounter: Payer: Self-pay | Admitting: Physician Assistant

## 2020-07-27 DIAGNOSIS — E785 Hyperlipidemia, unspecified: Secondary | ICD-10-CM | POA: Insufficient documentation

## 2020-08-06 ENCOUNTER — Other Ambulatory Visit: Payer: Self-pay | Admitting: Physician Assistant

## 2020-08-06 DIAGNOSIS — E782 Mixed hyperlipidemia: Secondary | ICD-10-CM

## 2020-08-08 ENCOUNTER — Other Ambulatory Visit: Payer: Self-pay

## 2020-08-08 MED ORDER — TRELEGY ELLIPTA 100-62.5-25 MCG/INH IN AEPB: 1.0000 | INHALATION_SPRAY | Freq: Every day | RESPIRATORY_TRACT | 0 refills | Status: DC

## 2020-08-28 DIAGNOSIS — E785 Hyperlipidemia, unspecified: Secondary | ICD-10-CM | POA: Diagnosis not present

## 2020-08-28 DIAGNOSIS — I10 Essential (primary) hypertension: Secondary | ICD-10-CM | POA: Diagnosis not present

## 2020-08-28 DIAGNOSIS — E890 Postprocedural hypothyroidism: Secondary | ICD-10-CM | POA: Diagnosis not present

## 2020-08-28 DIAGNOSIS — D638 Anemia in other chronic diseases classified elsewhere: Secondary | ICD-10-CM | POA: Diagnosis not present

## 2020-08-29 NOTE — Progress Notes (Signed)
Hemoglobin continues to decrease.  Are you taking iron?  Please add:  Ferritin/TIBC/serum iron/retic/Erythropoietin/b12/folate  Elevated glucose if fasting. Add A1C.  Your protein is back down. It looks like you are not eating as much as you should. Make sure drinking your nutritional supplements.   Cholesterol looks great.

## 2020-08-31 LAB — ERYTHROPOIETIN: Erythropoietin: 19.9 m[IU]/mL — ABNORMAL HIGH (ref 2.6–18.5)

## 2020-08-31 LAB — CBC WITH DIFFERENTIAL/PLATELET
Absolute Monocytes: 568 cells/uL (ref 200–950)
Basophils Absolute: 40 cells/uL (ref 0–200)
Basophils Relative: 0.6 %
Eosinophils Absolute: 310 cells/uL (ref 15–500)
Eosinophils Relative: 4.7 %
HCT: 27.6 % — ABNORMAL LOW (ref 35.0–45.0)
Hemoglobin: 8.8 g/dL — ABNORMAL LOW (ref 11.7–15.5)
Lymphs Abs: 607 cells/uL — ABNORMAL LOW (ref 850–3900)
MCH: 29.3 pg (ref 27.0–33.0)
MCHC: 31.9 g/dL — ABNORMAL LOW (ref 32.0–36.0)
MCV: 92 fL (ref 80.0–100.0)
MPV: 12.1 fL (ref 7.5–12.5)
Monocytes Relative: 8.6 %
Neutro Abs: 5075 cells/uL (ref 1500–7800)
Neutrophils Relative %: 76.9 %
Platelets: 233 10*3/uL (ref 140–400)
RBC: 3 10*6/uL — ABNORMAL LOW (ref 3.80–5.10)
RDW: 11.9 % (ref 11.0–15.0)
Total Lymphocyte: 9.2 %
WBC: 6.6 10*3/uL (ref 3.8–10.8)

## 2020-08-31 LAB — LIPID PANEL W/REFLEX DIRECT LDL
Cholesterol: 134 mg/dL (ref <200)
HDL: 63 mg/dL (ref >=50)
LDL Cholesterol (Calc): 59 mg/dL (calc)
Non-HDL Cholesterol (Calc): 71 mg/dL (calc) (ref <130)
Total CHOL/HDL Ratio: 2.1 (calc) (ref <5.0)
Triglycerides: 50 mg/dL (ref <150)

## 2020-08-31 LAB — COMPLETE METABOLIC PANEL WITH GFR
AG Ratio: 1.5 (calc) (ref 1.0–2.5)
ALT: 7 U/L (ref 6–29)
AST: 14 U/L (ref 10–35)
Albumin: 3.5 g/dL — ABNORMAL LOW (ref 3.6–5.1)
Alkaline phosphatase (APISO): 102 U/L (ref 37–153)
BUN/Creatinine Ratio: 49 (calc) — ABNORMAL HIGH (ref 6–22)
BUN: 23 mg/dL (ref 7–25)
CO2: 40 mmol/L — ABNORMAL HIGH (ref 20–32)
Calcium: 8.7 mg/dL (ref 8.6–10.4)
Chloride: 96 mmol/L — ABNORMAL LOW (ref 98–110)
Creat: 0.47 mg/dL — ABNORMAL LOW (ref 0.60–0.93)
GFR, Est African American: 114 mL/min/{1.73_m2} (ref >=60)
GFR, Est Non African American: 98 mL/min/{1.73_m2} (ref >=60)
Globulin: 2.3 g/dL (calc) (ref 1.9–3.7)
Glucose, Bld: 141 mg/dL — ABNORMAL HIGH (ref 65–139)
Potassium: 3.5 mmol/L (ref 3.5–5.3)
Sodium: 141 mmol/L (ref 135–146)
Total Bilirubin: 0.5 mg/dL (ref 0.2–1.2)
Total Protein: 5.8 g/dL — ABNORMAL LOW (ref 6.1–8.1)

## 2020-08-31 LAB — RETICULOCYTES
ABS Retic: 17340 cells/uL — ABNORMAL LOW (ref 20000–8000)
Retic Ct Pct: 0.6 %

## 2020-08-31 LAB — IRON,TIBC AND FERRITIN PANEL
%SAT: 7 % (calc) — ABNORMAL LOW (ref 16–45)
Ferritin: 45 ng/mL (ref 16–288)
Iron: 18 ug/dL — ABNORMAL LOW (ref 45–160)
TIBC: 257 mcg/dL (calc) (ref 250–450)

## 2020-08-31 LAB — B12 AND FOLATE PANEL
Folate: 5.5 ng/mL
Vitamin B-12: 395 pg/mL (ref 200–1100)

## 2020-08-31 LAB — TSH: TSH: 1.54 mIU/L (ref 0.40–4.50)

## 2020-08-31 LAB — HEMOGLOBIN A1C W/OUT EAG: Hgb A1c MFr Bld: 5.4 % of total Hgb (ref <5.7)

## 2020-09-04 NOTE — Progress Notes (Signed)
Serum iron low. Make sure taking ferrous sulfate at least once a day with meals add B12 since on the low side 107mcg daily.   Recheck cbc and iron/b12 in 6 weeks if no change might need to send to hematology. I would like hemoglobin above 9 at baseline.

## 2020-09-06 ENCOUNTER — Other Ambulatory Visit: Payer: Self-pay | Admitting: Neurology

## 2020-09-06 DIAGNOSIS — D638 Anemia in other chronic diseases classified elsewhere: Secondary | ICD-10-CM

## 2020-09-06 DIAGNOSIS — E538 Deficiency of other specified B group vitamins: Secondary | ICD-10-CM

## 2020-09-29 ENCOUNTER — Emergency Department (HOSPITAL_COMMUNITY): Payer: Medicare Other

## 2020-09-29 ENCOUNTER — Inpatient Hospital Stay (HOSPITAL_COMMUNITY)
Admission: EM | Admit: 2020-09-29 | Discharge: 2020-10-02 | DRG: 190 | Disposition: A | Discharge status: Home Health | Payer: Medicare Other | Attending: Internal Medicine | Admitting: Internal Medicine

## 2020-09-29 ENCOUNTER — Other Ambulatory Visit: Payer: Self-pay

## 2020-09-29 DIAGNOSIS — R0602 Shortness of breath: Secondary | ICD-10-CM | POA: Diagnosis not present

## 2020-09-29 DIAGNOSIS — J189 Pneumonia, unspecified organism: Secondary | ICD-10-CM | POA: Diagnosis not present

## 2020-09-29 DIAGNOSIS — J9611 Chronic respiratory failure with hypoxia: Secondary | ICD-10-CM | POA: Diagnosis present

## 2020-09-29 DIAGNOSIS — Z902 Acquired absence of lung [part of]: Secondary | ICD-10-CM

## 2020-09-29 DIAGNOSIS — Z681 Body mass index (BMI) 19 or less, adult: Secondary | ICD-10-CM

## 2020-09-29 DIAGNOSIS — J439 Emphysema, unspecified: Secondary | ICD-10-CM | POA: Diagnosis not present

## 2020-09-29 DIAGNOSIS — R7989 Other specified abnormal findings of blood chemistry: Secondary | ICD-10-CM | POA: Diagnosis not present

## 2020-09-29 DIAGNOSIS — Z79890 Hormone replacement therapy (postmenopausal): Secondary | ICD-10-CM

## 2020-09-29 DIAGNOSIS — Z79899 Other long term (current) drug therapy: Secondary | ICD-10-CM

## 2020-09-29 DIAGNOSIS — I2510 Atherosclerotic heart disease of native coronary artery without angina pectoris: Secondary | ICD-10-CM | POA: Diagnosis present

## 2020-09-29 DIAGNOSIS — R64 Cachexia: Secondary | ICD-10-CM | POA: Diagnosis present

## 2020-09-29 DIAGNOSIS — I10 Essential (primary) hypertension: Secondary | ICD-10-CM | POA: Diagnosis present

## 2020-09-29 DIAGNOSIS — J449 Chronic obstructive pulmonary disease, unspecified: Secondary | ICD-10-CM | POA: Diagnosis not present

## 2020-09-29 DIAGNOSIS — Z85118 Personal history of other malignant neoplasm of bronchus and lung: Secondary | ICD-10-CM

## 2020-09-29 DIAGNOSIS — Z86711 Personal history of pulmonary embolism: Secondary | ICD-10-CM

## 2020-09-29 DIAGNOSIS — J9612 Chronic respiratory failure with hypercapnia: Secondary | ICD-10-CM | POA: Diagnosis not present

## 2020-09-29 DIAGNOSIS — J441 Chronic obstructive pulmonary disease with (acute) exacerbation: Principal | ICD-10-CM | POA: Diagnosis present

## 2020-09-29 DIAGNOSIS — Z9981 Dependence on supplemental oxygen: Secondary | ICD-10-CM

## 2020-09-29 DIAGNOSIS — Z87891 Personal history of nicotine dependence: Secondary | ICD-10-CM

## 2020-09-29 DIAGNOSIS — J9621 Acute and chronic respiratory failure with hypoxia: Secondary | ICD-10-CM | POA: Diagnosis not present

## 2020-09-29 DIAGNOSIS — J9620 Acute and chronic respiratory failure, unspecified whether with hypoxia or hypercapnia: Secondary | ICD-10-CM | POA: Diagnosis present

## 2020-09-29 DIAGNOSIS — J9622 Acute and chronic respiratory failure with hypercapnia: Secondary | ICD-10-CM | POA: Diagnosis not present

## 2020-09-29 DIAGNOSIS — R1312 Dysphagia, oropharyngeal phase: Secondary | ICD-10-CM | POA: Diagnosis present

## 2020-09-29 DIAGNOSIS — I509 Heart failure, unspecified: Secondary | ICD-10-CM

## 2020-09-29 DIAGNOSIS — R911 Solitary pulmonary nodule: Secondary | ICD-10-CM | POA: Diagnosis present

## 2020-09-29 DIAGNOSIS — G2581 Restless legs syndrome: Secondary | ICD-10-CM | POA: Diagnosis present

## 2020-09-29 DIAGNOSIS — J9600 Acute respiratory failure, unspecified whether with hypoxia or hypercapnia: Secondary | ICD-10-CM | POA: Diagnosis present

## 2020-09-29 DIAGNOSIS — I248 Other forms of acute ischemic heart disease: Secondary | ICD-10-CM | POA: Diagnosis present

## 2020-09-29 DIAGNOSIS — Z880 Allergy status to penicillin: Secondary | ICD-10-CM

## 2020-09-29 DIAGNOSIS — Z7982 Long term (current) use of aspirin: Secondary | ICD-10-CM

## 2020-09-29 DIAGNOSIS — Z833 Family history of diabetes mellitus: Secondary | ICD-10-CM

## 2020-09-29 DIAGNOSIS — J440 Chronic obstructive pulmonary disease with acute lower respiratory infection: Secondary | ICD-10-CM | POA: Diagnosis present

## 2020-09-29 DIAGNOSIS — R778 Other specified abnormalities of plasma proteins: Secondary | ICD-10-CM | POA: Diagnosis present

## 2020-09-29 DIAGNOSIS — Z20822 Contact with and (suspected) exposure to covid-19: Secondary | ICD-10-CM | POA: Diagnosis present

## 2020-09-29 DIAGNOSIS — I739 Peripheral vascular disease, unspecified: Secondary | ICD-10-CM | POA: Diagnosis present

## 2020-09-29 DIAGNOSIS — J9610 Chronic respiratory failure, unspecified whether with hypoxia or hypercapnia: Secondary | ICD-10-CM | POA: Diagnosis present

## 2020-09-29 DIAGNOSIS — Z888 Allergy status to other drugs, medicaments and biological substances status: Secondary | ICD-10-CM

## 2020-09-29 DIAGNOSIS — Z8049 Family history of malignant neoplasm of other genital organs: Secondary | ICD-10-CM

## 2020-09-29 DIAGNOSIS — J8410 Pulmonary fibrosis, unspecified: Secondary | ICD-10-CM | POA: Diagnosis not present

## 2020-09-29 DIAGNOSIS — Z802 Family history of malignant neoplasm of other respiratory and intrathoracic organs: Secondary | ICD-10-CM

## 2020-09-29 DIAGNOSIS — Z8249 Family history of ischemic heart disease and other diseases of the circulatory system: Secondary | ICD-10-CM

## 2020-09-29 DIAGNOSIS — E43 Unspecified severe protein-calorie malnutrition: Secondary | ICD-10-CM | POA: Insufficient documentation

## 2020-09-29 DIAGNOSIS — Z820 Family history of epilepsy and other diseases of the nervous system: Secondary | ICD-10-CM

## 2020-09-29 HISTORY — DX: Dyspnea, unspecified: R06.00

## 2020-09-29 HISTORY — DX: Pneumonia, unspecified organism: J18.9

## 2020-09-29 HISTORY — DX: Chronic obstructive pulmonary disease with (acute) exacerbation: J44.1

## 2020-09-29 LAB — I-STAT VENOUS BLOOD GAS, ED
Acid-Base Excess: 15 mmol/L — ABNORMAL HIGH (ref 0.0–2.0)
Bicarbonate: 42 mmol/L — ABNORMAL HIGH (ref 20.0–28.0)
Calcium, Ion: 1.08 mmol/L — ABNORMAL LOW (ref 1.15–1.40)
HCT: 31 % — ABNORMAL LOW (ref 36.0–46.0)
Hemoglobin: 10.5 g/dL — ABNORMAL LOW (ref 12.0–15.0)
O2 Saturation: 67 %
Potassium: 4.7 mmol/L (ref 3.5–5.1)
Sodium: 134 mmol/L — ABNORMAL LOW (ref 135–145)
TCO2: 44 mmol/L — ABNORMAL HIGH (ref 22–32)
pCO2, Ven: 65.3 mmHg — ABNORMAL HIGH (ref 44.0–60.0)
pH, Ven: 7.416 (ref 7.250–7.430)
pO2, Ven: 36 mmHg (ref 32.0–45.0)

## 2020-09-29 LAB — RESP PANEL BY RT-PCR (FLU A&B, COVID) ARPGX2
Influenza A by PCR: NEGATIVE
Influenza B by PCR: NEGATIVE
SARS Coronavirus 2 by RT PCR: NEGATIVE

## 2020-09-29 LAB — BASIC METABOLIC PANEL
Anion gap: 10 (ref 5–15)
BUN: 15 mg/dL (ref 8–23)
CO2: 38 mmol/L — ABNORMAL HIGH (ref 22–32)
Calcium: 9.3 mg/dL (ref 8.9–10.3)
Chloride: 89 mmol/L — ABNORMAL LOW (ref 98–111)
Creatinine, Ser: 0.57 mg/dL (ref 0.44–1.00)
GFR, Estimated: >60 mL/min (ref >60)
Glucose, Bld: 166 mg/dL — ABNORMAL HIGH (ref 70–99)
Potassium: 4.3 mmol/L (ref 3.5–5.1)
Sodium: 137 mmol/L (ref 135–145)

## 2020-09-29 LAB — CBC
HCT: 31.6 % — ABNORMAL LOW (ref 36.0–46.0)
Hemoglobin: 9.4 g/dL — ABNORMAL LOW (ref 12.0–15.0)
MCH: 29 pg (ref 26.0–34.0)
MCHC: 29.7 g/dL — ABNORMAL LOW (ref 30.0–36.0)
MCV: 97.5 fL (ref 80.0–100.0)
Platelets: 362 10*3/uL (ref 150–400)
RBC: 3.24 MIL/uL — ABNORMAL LOW (ref 3.87–5.11)
RDW: 12.9 % (ref 11.5–15.5)
WBC: 15.1 10*3/uL — ABNORMAL HIGH (ref 4.0–10.5)
nRBC: 0 % (ref 0.0–0.2)

## 2020-09-29 LAB — TROPONIN I (HIGH SENSITIVITY)
Troponin I (High Sensitivity): 318 ng/L (ref <18)
Troponin I (High Sensitivity): 389 ng/L

## 2020-09-29 LAB — D-DIMER, QUANTITATIVE: D-Dimer, Quant: 2.5 ug/mL-FEU — ABNORMAL HIGH (ref 0.00–0.50)

## 2020-09-29 MED ORDER — ENOXAPARIN SODIUM 40 MG/0.4ML ~~LOC~~ SOLN
40.0000 mg | Freq: Every day | SUBCUTANEOUS | Status: DC
  Administered 2020-09-30 – 2020-10-01 (×3): 40 mg via SUBCUTANEOUS
  Filled 2020-09-29 (×3): qty 0.4

## 2020-09-29 MED ORDER — VORTIOXETINE HBR 20 MG PO TABS
20.0000 mg | ORAL_TABLET | Freq: Every day | ORAL | Status: DC
  Administered 2020-09-30 – 2020-10-02 (×3): 20 mg via ORAL
  Filled 2020-09-29 (×3): qty 1

## 2020-09-29 MED ORDER — CELECOXIB 200 MG PO CAPS
200.0000 mg | ORAL_CAPSULE | Freq: Every day | ORAL | Status: DC
  Administered 2020-09-30 – 2020-10-02 (×3): 200 mg via ORAL
  Filled 2020-09-29 (×3): qty 1

## 2020-09-29 MED ORDER — IOHEXOL 350 MG/ML SOLN
75.0000 mL | Freq: Once | INTRAVENOUS | Status: AC | PRN
  Administered 2020-09-29: 75 mL via INTRAVENOUS

## 2020-09-29 MED ORDER — LABETALOL HCL 200 MG PO TABS
400.0000 mg | ORAL_TABLET | Freq: Once | ORAL | Status: AC
  Administered 2020-09-29: 400 mg via ORAL
  Filled 2020-09-29: qty 2

## 2020-09-29 MED ORDER — LEVALBUTEROL HCL 0.63 MG/3ML IN NEBU: 0.6300 mg | INHALATION_SOLUTION | Freq: Four times a day (QID) | RESPIRATORY_TRACT | Status: DC | PRN

## 2020-09-29 MED ORDER — IRBESARTAN 300 MG PO TABS
300.0000 mg | ORAL_TABLET | Freq: Every day | ORAL | Status: DC
  Administered 2020-09-30 – 2020-10-01 (×3): 300 mg via ORAL
  Filled 2020-09-29 (×4): qty 1

## 2020-09-29 MED ORDER — METHYLPREDNISOLONE SODIUM SUCC 125 MG IJ SOLR
125.0000 mg | Freq: Once | INTRAMUSCULAR | Status: AC
  Administered 2020-09-29: 125 mg via INTRAVENOUS
  Filled 2020-09-29: qty 2

## 2020-09-29 MED ORDER — ALPRAZOLAM 0.5 MG PO TABS: 1.0000 mg | ORAL_TABLET | Freq: Every day | ORAL | Status: DC | PRN

## 2020-09-29 MED ORDER — SODIUM CHLORIDE 0.9 % IV SOLN
2.0000 g | INTRAVENOUS | Status: DC
  Administered 2020-09-30 (×2): 2 g via INTRAVENOUS
  Filled 2020-09-29 (×3): qty 20

## 2020-09-29 MED ORDER — MAGNESIUM SULFATE 2 GM/50ML IV SOLN
2.0000 g | Freq: Once | INTRAVENOUS | Status: AC
  Administered 2020-09-29: 2 g via INTRAVENOUS
  Filled 2020-09-29: qty 50

## 2020-09-29 MED ORDER — UMECLIDINIUM BROMIDE 62.5 MCG/INH IN AEPB
1.0000 | INHALATION_SPRAY | Freq: Every day | RESPIRATORY_TRACT | Status: DC
  Administered 2020-10-01 – 2020-10-02 (×2): 1 via RESPIRATORY_TRACT
  Filled 2020-09-29: qty 7

## 2020-09-29 MED ORDER — CLONIDINE HCL 0.3 MG/24HR TD PTWK
0.3000 mg | MEDICATED_PATCH | TRANSDERMAL | Status: DC
  Filled 2020-09-29 (×2): qty 1

## 2020-09-29 MED ORDER — MOMETASONE FURO-FORMOTEROL FUM 200-5 MCG/ACT IN AERO
2.0000 | INHALATION_SPRAY | Freq: Two times a day (BID) | RESPIRATORY_TRACT | Status: DC
  Administered 2020-09-30 – 2020-10-02 (×4): 2 via RESPIRATORY_TRACT
  Filled 2020-09-29: qty 8.8

## 2020-09-29 MED ORDER — LEVOTHYROXINE SODIUM 50 MCG PO TABS
50.0000 ug | ORAL_TABLET | Freq: Every day | ORAL | Status: DC
  Administered 2020-09-30 – 2020-10-02 (×3): 50 ug via ORAL
  Filled 2020-09-29 (×4): qty 1

## 2020-09-29 MED ORDER — AEROCHAMBER PLUS FLO-VU MEDIUM MISC: 1.0000 | Freq: Once | Status: DC

## 2020-09-29 MED ORDER — LABETALOL HCL 200 MG PO TABS
400.0000 mg | ORAL_TABLET | Freq: Two times a day (BID) | ORAL | Status: DC
  Administered 2020-09-30 – 2020-10-02 (×6): 400 mg via ORAL
  Filled 2020-09-29 (×6): qty 2

## 2020-09-29 MED ORDER — ASPIRIN EC 81 MG PO TBEC
81.0000 mg | DELAYED_RELEASE_TABLET | Freq: Every day | ORAL | Status: DC
  Administered 2020-09-30 – 2020-10-02 (×3): 81 mg via ORAL
  Filled 2020-09-29 (×3): qty 1

## 2020-09-29 MED ORDER — AZITHROMYCIN 250 MG PO TABS
500.0000 mg | ORAL_TABLET | ORAL | Status: AC
  Administered 2020-09-30: 500 mg via ORAL
  Filled 2020-09-29: qty 2

## 2020-09-29 MED ORDER — AZITHROMYCIN 250 MG PO TABS
250.0000 mg | ORAL_TABLET | Freq: Every day | ORAL | Status: DC
  Administered 2020-09-30 – 2020-10-01 (×2): 250 mg via ORAL
  Filled 2020-09-29 (×5): qty 1

## 2020-09-29 MED ORDER — ALBUTEROL SULFATE HFA 108 (90 BASE) MCG/ACT IN AERS
8.0000 | INHALATION_SPRAY | Freq: Once | RESPIRATORY_TRACT | Status: AC
  Administered 2020-09-29: 8 via RESPIRATORY_TRACT
  Filled 2020-09-29: qty 6.7

## 2020-09-29 MED ORDER — IRBESARTAN 300 MG PO TABS
300.0000 mg | ORAL_TABLET | Freq: Once | ORAL | Status: AC
  Administered 2020-09-29: 300 mg via ORAL
  Filled 2020-09-29: qty 1

## 2020-09-29 MED ORDER — IPRATROPIUM-ALBUTEROL 0.5-2.5 (3) MG/3ML IN SOLN: 3.0000 mL | RESPIRATORY_TRACT | Status: DC | PRN

## 2020-09-29 MED ORDER — QUETIAPINE FUMARATE 100 MG PO TABS
100.0000 mg | ORAL_TABLET | Freq: Every day | ORAL | Status: DC
  Administered 2020-09-30 – 2020-10-01 (×3): 100 mg via ORAL
  Filled 2020-09-29 (×3): qty 1
  Filled 2020-09-29: qty 2

## 2020-09-29 MED ORDER — IPRATROPIUM BROMIDE HFA 17 MCG/ACT IN AERS
4.0000 | INHALATION_SPRAY | Freq: Once | RESPIRATORY_TRACT | Status: AC
  Administered 2020-09-29: 4 via RESPIRATORY_TRACT
  Filled 2020-09-29: qty 12.9

## 2020-09-29 MED ORDER — ATORVASTATIN CALCIUM 10 MG PO TABS
20.0000 mg | ORAL_TABLET | Freq: Every day | ORAL | Status: DC
  Administered 2020-09-30 – 2020-10-01 (×3): 20 mg via ORAL
  Filled 2020-09-29 (×3): qty 2

## 2020-09-29 MED ORDER — PREDNISONE 20 MG PO TABS
40.0000 mg | ORAL_TABLET | Freq: Every day | ORAL | Status: DC
  Administered 2020-09-30 – 2020-10-02 (×3): 40 mg via ORAL
  Filled 2020-09-29 (×3): qty 2

## 2020-09-29 MED ORDER — ALBUTEROL SULFATE HFA 108 (90 BASE) MCG/ACT IN AERS: 2.0000 | INHALATION_SPRAY | Freq: Four times a day (QID) | RESPIRATORY_TRACT | Status: DC | PRN

## 2020-09-29 NOTE — ED Provider Notes (Signed)
Care assumed from Dr. Maryan Rued at shift change, please see their notes for full documentation of patient's complaint/HPI. Briefly, pt here with increasing shortness of breath over the past several days with hypoxia last night in the 70s. Results so far show leukocytosis of 15,000 and elevated d dimer at 2.50 with a troponin of 318 > 389. Awaiting CTA to rule out PE. Plan is to admit afterwards.   Physical Exam  BP (!) 181/71   Pulse 76   Temp 99.5 F (37.5 C) (Oral)   Resp (!) 33   SpO2 100%   Physical Exam Vitals and nursing note reviewed.  Constitutional:      Appearance: She is not ill-appearing.  HENT:     Head: Normocephalic and atraumatic.  Eyes:     Conjunctiva/sclera: Conjunctivae normal.  Cardiovascular:     Rate and Rhythm: Normal rate and regular rhythm.  Pulmonary:     Effort: Pulmonary effort is normal.     Breath sounds: Normal breath sounds.  Skin:    General: Skin is warm and dry.     Coloration: Skin is not jaundiced.  Neurological:     Mental Status: She is alert.     ED Course/Procedures   Clinical Course as of 09/29/20 2225  Sat Sep 29, 2020  2146 IMPRESSION: 1. Interval increase in size of a right upper lobe 1.1 cm subsolid nodular-like opacity as well as interval development of nodular-like peribronchovascular subsolid opacities within the left lower lobe and right upper lobe. Findings suspicious for malignancy (such as adenocarcinoma) versus infection/inflammation. Additional imaging evaluation or consultation with Pulmonology or Thoracic Surgery recommended. 2. Borderline enlarged prevascular and right hilar lymph nodes likely reactive in etiology. 3. Borderline enlarged main pulmonary artery. Correlate with pulmonary hypertension. 4. Aortic Atherosclerosis (ICD10-I70.0) and Emphysema (ICD10-J43.9). [MV]    Clinical Course User Index [MV] Eustaquio Maize, PA-C    Procedures  DG Chest 2 View  Result Date: 09/29/2020 CLINICAL DATA:   Shortness of breath EXAM: CHEST - 2 VIEW COMPARISON:  01/26/2018 FINDINGS: Redemonstrated postoperative findings of left upper lobectomy with volume loss of the left hemithorax and leftward shift of the mediastinum. Hyperinflation and emphysema of the right lung. The visualized skeletal structures are unremarkable. IMPRESSION: Redemonstrated postoperative findings of left upper lobectomy. Hyperinflation and emphysema of the right lung. No acute abnormality of the lungs. Electronically Signed   By: Eddie Candle M.D.   On: 09/29/2020 15:08    MDM  CTA not crossing over in the system however does show concern for infection versus opacity.  Attending physician Dr. Regenia Skeeter at the read, in the setting of leukocytosis will treat for infection at this time and plan for admission.  Given elevated troponins will discuss with cardiology in the setting of patient not having PEs.  Patient continues to not complain of any chest pain.  Discussed case with [facility] hospitalist Dr. Fabio Neighbors who agrees to evaluate patient for admission.  Discussed case with cardiology Dr. Marcelle Smiling who recommends ECHO inpatient and if pt starts complaining of chest pain to reconsult.       Eustaquio Maize, PA-C 09/29/20 2225    Blanchie Dessert, MD 10/04/20 [phone]

## 2020-09-29 NOTE — ED Notes (Signed)
Patient transported to CT 

## 2020-09-29 NOTE — ED Notes (Signed)
Pt back from ct, placed on bed pan

## 2020-09-29 NOTE — ED Provider Notes (Signed)
[facility] EMERGENCY DEPARTMENT Provider Note   CSN: [id number] Arrival date & time: 09/29/20  1323     History Chief Complaint  Patient presents with  . Shortness of Breath    [name] is a 74 y.o. female.  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  Patient states for the last 4 days she has not felt well.  She has had worsening shortness of breath, generalized weakness and feeling lightheaded with standing and attempting to walk.  Shortness of breath does get worse with walking.  She has had ongoing wheezing and has been using her inhalers as prescribed.  She denies fever, chest pain or abdominal pain.  She denies any lower extremity swelling.  Caregiver did report that in the last few days she has had intermittent bouts of hypoxia confusion and lethargy.  Patient reports she is just not felt herself.  She does take an aspirin daily but denies any other anticoagulation.  Her husband had Covid 3 months ago but that is her only recent Covid contact that she is aware of.  She denies any recent change in her medications.  Since the shortness of breath has gotten worse she denies starting steroids.  The history is provided by the patient and medical records.  Shortness of Breath      Past Medical History:  Diagnosis Date  . Bronchopleural fistula ([place])   . CAD (coronary artery disease)    20% stenosis on cath - no intervention required (W-S cards), negative nuclear stress test 11/07  . Cancer [facility]) 2007   lung (Dr. Earlie Server and Dr. Arlyce Dice)  . COPD (chronic obstructive pulmonary disease) ([place]) 9/10   golds stage III, FeV1 39%  . Hurthle cell adenoma of thyroid 2007  . Hypertension   . Iron deficiency anemia 2/08   s/p 2 unit transfusion  . Lung cancer ([place])   .  Peripheral vascular disease ([place])   . RLS (restless legs syndrome)     Patient Active Problem List   Diagnosis Date Noted  . Dyslipidemia (high LDL; low HDL) 07/27/2020  . Chronic daily headache 01/06/2020  . Hypercapnia 06/28/2019  . RLS (restless legs syndrome) 06/28/2019  . Hyponatremia 05/04/2019  . Epigastric pain 05/04/2019  . Anemia of chronic disease 12/20/2018  . Essential hypertension, benign 07/30/2018  . Headache, new daily persistent ([facility]) 07/28/2018  . Breast asymmetry 06/21/2018  . Chronic respiratory failure ([place]) 04/02/2018  . Venous stasis of lower extremity 02/25/2018  . Primary osteoarthritis of both hands 02/25/2018  . History of pulmonary embolus (PE) 01/29/2018  . Exertional chest pain 01/26/2018  . Depression with anxiety 08/01/2017  . Lung cancer ([place]) 08/01/2017  . Cavus foot, acquired 04/28/2017  . Left foot pain 02/17/2017  . Chronic pain of left ankle 12/16/2016  . Osteopenia 04/30/2016  . Anxiety as acute reaction to exceptional stress 12/05/2015  . Muscle spasm of back 12/05/2015  . Memory changes 12/05/2015  . Elevated blood pressure 10/16/2015  . Split S2 (second heart sound) 10/16/2015  . IFG (impaired fasting glucose) 04/12/2015  . Transient cerebral ischemic attack 03/01/2014  . Carotid artery narrowing 02/27/2014  . Clinical depression 02/27/2014  . Episode of abnormal behavior 11/28/2013  . Word finding difficulty 11/28/2013  . Memory loss 11/28/2013  . Systolic murmur 03/47/4259  . Sensorineural  hearing loss of both ears 11/18/2013  . Insomnia 09/04/2013  . GERD (gastroesophageal reflux disease) 09/04/2013  . History of hip fracture 09/02/2013  . Hypoxemia 06/22/2013  . Cerebrovascular accident, old 06/14/2013  . HYPOTHYROIDISM, POSTSURGICAL 05/19/2007  . DISEASE, ISCHEMIC HEART, CHRONIC NOS 05/19/2007  . ANEMIA, IRON DEFICIENCY NOS 01/04/2007  . History of lung cancer 07/28/2006  . HYPERCHOLESTEROLEMIA 07/28/2006  . Major  depressive disorder, recurrent episode ([place]) 07/28/2006  . Anxiety state 07/28/2006  . PERIPHERAL VASCULAR DISEASE, UNSPEC. 07/28/2006  . COPD with chronic bronchitis 07/28/2006    Past Surgical History:  Procedure Laterality Date  . APPENDECTOMY  18  . CHOLECYSTECTOMY  18  . heart cartherization    . LUL  2005   LUL wedge resection/VATS  . LUL  4/08   LUL lobectomy for cystic cavity and Candida, no cancer seen  . THYROIDECTOMY, PARTIAL  2007   Dr. Arlyce Dice     OB History   No obstetric history on file.     Family History  Problem Relation Age of Onset  . Cancer Mother        Larynx & Endometrial cancer  . Cancer Father        laryngeal cancer  . Hypertension Father   . Diabetes Sister   . Multiple sclerosis Sister   . Cancer Sister 55       breast    Social History   Tobacco Use  . Smoking status: Former Smoker    Packs/day: 1.50    Years: 30.00    Pack years: 45.00    Types: Cigarettes    Quit date: 10/20/2004    Years since quitting: 15.9  . Smokeless tobacco: Never Used  Vaping Use  . Vaping Use: Never used  Substance Use Topics  . Alcohol use: No  . Drug use: No    Home Medications Prior to Admission medications   Medication Sig Start Date End Date Taking? Authorizing Provider  albuterol (PROVENTIL HFA;VENTOLIN HFA) 108 (90 Base) MCG/ACT inhaler Inhale 2 puffs into the lungs every 6 (six) hours as needed for wheezing or shortness of breath. 06/23/17   Rigoberto Noel, MD  ALPRAZolam Duanne Moron) 1 MG tablet Take 1 tablet (1 mg total) by mouth daily as needed for anxiety. 07/20/20   Breeback, Jade L, PA-C  AMBULATORY NON FORMULARY MEDICATION Hypoxia with positive walk test. O2 2L as needed for SOB and decreased O2 stats with exertion. Pt request portable O2 tanks only. 04/06/18   Donella Stade, PA-C  aspirin EC 81 MG tablet Take 81 mg by mouth daily.    [provider]  atorvastatin (LIPITOR) 20 MG tablet Take 1 tablet (20 mg total) by mouth at bedtime.  Labs for further refills 08/07/20   Iran Planas L, PA-C  celecoxib (CELEBREX) 200 MG capsule Take 1 capsule (200 mg total) by mouth daily. 03/20/20 03/20/21  Donella Stade, PA-C  cloNIDine (CATAPRES - DOSED IN MG/24 HR) 0.3 mg/24hr patch Place 1 patch (0.3 mg total) onto the skin once a week. 07/20/20   Breeback, Jade L, PA-C  Fluticasone-Umeclidin-Vilant (TRELEGY ELLIPTA) 100-62.5-25 MCG/INH AEPB Inhale 1 puff into the lungs daily. 08/08/20   Breeback, Jade L, PA-C  ipratropium-albuterol (DUONEB) 0.5-2.5 (3) MG/3ML SOLN Take 3 mLs by nebulization every 4 (four) hours as needed. 07/20/20   Breeback, Jade L, PA-C  labetalol (NORMODYNE) 200 MG tablet Take 2 tablets (400 mg total) by mouth 2 (two) times daily. 07/20/20   Donella Stade,  PA-C  levothyroxine (SYNTHROID) 50 MCG tablet take 1 tablet by mouth once daily 07/20/20   Breeback, Jade L, PA-C  omeprazole (PRILOSEC) 40 MG capsule TAKE 1 CAPSULE(40 MG) BY MOUTH DAILY 12/30/19   Breeback, Jade L, PA-C  QUEtiapine (SEROQUEL) 100 MG tablet Take 1 tablet (100 mg total) by mouth at bedtime. 01/30/20   Breeback, Jade L, PA-C  valsartan (DIOVAN) 320 MG tablet Take 1 tablet (320 mg total) by mouth daily. 07/20/20   Breeback, Jade L, PA-C  vortioxetine HBr (TRINTELLIX) 20 MG TABS tablet Take 1 tablet (20 mg total) by mouth daily. 07/20/20   Breeback, Luvenia Starch L, PA-C    Allergies    Desvenlafaxine succinate er, Lisinopril-hydrochlorothiazide, Nitrofurantoin monohyd macro, Norvasc [amlodipine], Penicillins, and Zoloft [sertraline hcl]  Review of Systems   Review of Systems  Respiratory: Positive for shortness of breath.   All other systems reviewed and are negative.   Physical Exam Updated Vital Signs BP (!) 223/76   Pulse 93   Temp 99.5 F (37.5 C) (Oral)   Resp (!) 25   SpO2 100%   Physical Exam Vitals and nursing note reviewed.  Constitutional:      General: She is not in acute distress.    Appearance: She is well-developed, underweight and  well-nourished. She is ill-appearing.  HENT:     Head: Normocephalic and atraumatic.     Mouth/Throat:     Mouth: Mucous membranes are moist.  Eyes:     Extraocular Movements: EOM normal.     Pupils: Pupils are equal, round, and reactive to light.  Cardiovascular:     Rate and Rhythm: Regular rhythm. Tachycardia present.     Pulses: Normal pulses and intact distal pulses.     Heart sounds: Normal heart sounds. No murmur heard. No friction rub.  Pulmonary:     Effort: Pulmonary effort is normal. Tachypnea present.     Breath sounds: Examination of the left-middle field reveals wheezing. Examination of the right-lower field reveals wheezing. Examination of the left-lower field reveals wheezing. Wheezing present. No rales.  Abdominal:     General: Bowel sounds are normal. There is no distension.     Palpations: Abdomen is soft.     Tenderness: There is no abdominal tenderness. There is no guarding or rebound.  Musculoskeletal:        General: No tenderness. Normal range of motion.     Right lower leg: No edema.     Left lower leg: No edema.     Comments: No edema  Skin:    General: Skin is warm and dry.     Findings: No rash.  Neurological:     General: No focal deficit present.     Mental Status: She is alert and oriented to person, place, and time. Mental status is at baseline.     Cranial Nerves: No cranial nerve deficit.  Psychiatric:        Mood and Affect: Mood and affect and mood normal.        Behavior: Behavior normal.        Thought Content: Thought content normal.     ED Results / Procedures / Treatments   Labs (all labs ordered are listed, but only abnormal results are displayed) Labs Reviewed  CBC - Abnormal; Notable for the following components:      Result Value   WBC 15.1 (*)    RBC 3.24 (*)    Hemoglobin 9.4 (*)    HCT 31.6 (*)  MCHC 29.7 (*)    All other components within normal limits  BASIC METABOLIC PANEL - Abnormal; Notable for the following  components:   Chloride 89 (*)    CO2 38 (*)    Glucose, Bld 166 (*)    All other components within normal limits  D-DIMER, QUANTITATIVE (NOT AT [facility]) - Abnormal; Notable for the following components:   D-Dimer, Quant 2.50 (*)    All other components within normal limits  I-STAT VENOUS BLOOD GAS, ED - Abnormal; Notable for the following components:   pCO2, Ven 65.3 (*)    Bicarbonate 42.0 (*)    TCO2 44 (*)    Acid-Base Excess 15.0 (*)    Sodium 134 (*)    Calcium, Ion 1.08 (*)    HCT 31.0 (*)    Hemoglobin 10.5 (*)    All other components within normal limits  TROPONIN I (HIGH SENSITIVITY) - Abnormal; Notable for the following components:   Troponin I (High Sensitivity) 318 (*)    All other components within normal limits  RESP PANEL BY RT-PCR (FLU A&B, COVID) ARPGX2  TROPONIN I (HIGH SENSITIVITY)    EKG EKG Interpretation  Date/Time:  Saturday September 29 2020 13:48:38 EST Ventricular Rate:  95 PR Interval:  174 QRS Duration: 80 QT Interval:  378 QTC Calculation: 475 R Axis:   90 Text Interpretation: Normal sinus rhythm Right atrial enlargement Rightward axis Pulmonary disease pattern Biventricular hypertrophy Septal infarct , age undetermined new ST & T wave abnormality, consider inferior ischemia Baseline wander Confirmed by Blanchie Dessert [phone]) on 09/29/2020 3:14:27 PM   Radiology DG Chest 2 View  Result Date: 09/29/2020 CLINICAL DATA:  Shortness of breath EXAM: CHEST - 2 VIEW COMPARISON:  01/26/2018 FINDINGS: Redemonstrated postoperative findings of left upper lobectomy with volume loss of the left hemithorax and leftward shift of the mediastinum. Hyperinflation and emphysema of the right lung. The visualized skeletal structures are unremarkable. IMPRESSION: Redemonstrated postoperative findings of left upper lobectomy. Hyperinflation and emphysema of the right lung. No acute abnormality of the lungs. Electronically Signed   By: Eddie Candle M.D.   On: 09/29/2020  15:08    Procedures Procedures (including critical care time)  Medications Ordered in ED Medications  albuterol (VENTOLIN HFA) 108 (90 Base) MCG/ACT inhaler 8 puff (has no administration in time range)  ipratropium (ATROVENT HFA) inhaler 4 puff (has no administration in time range)  magnesium sulfate IVPB 2 g 50 mL (has no administration in time range)  methylPREDNISolone sodium succinate (SOLU-MEDROL) 125 mg/2 mL injection 125 mg (has no administration in time range)  AeroChamber Plus Flo-Vu Medium MISC 1 each (has no administration in time range)    ED Course  I have reviewed the triage vital signs and the nursing notes.  Pertinent labs & imaging results that were available during my care of the patient were reviewed by me and considered in my medical decision making (see chart for details).    MDM Rules/Calculators/A&P                          Elderly female with multiple medical problems presenting today with 4 days of not feeling well as well as family reporting hypoxia, confusion and lethargy at home that is been intermittent.  Patient wears 3 L of oxygen chronically and currently is 100% but she is tachypneic, tachycardic and wheezing.  She denies any localized pain but does report being lightheaded when she attempts to walk and  having worsening dyspnea.  She has received the Covid vaccine.  She denies any recent Covid contacts.  She denies fever and productive cough.  Patient's labs today with a CBC consistent with leukocytosis of 15,000, stable hemoglobin of 9 and normal platelet count, BMP with elevated CO2 of 38Normal renal function.  Chest x-ray date your he demonstrates postoperative findings in the left upper lung from lobectomy.  Hyperinflation as well but no other acute abnormalities.  EKG today shows some nonspecific ST changes but prior EKG to compare is over 58 years old.  Patient denies any chest pain or abdominal pain.  Concern for possible COPD exacerbation versus  infectious etiology versus PE.  Will give Solu-Medrol, magnesium, albuterol and Atrovent to evaluate for improvement.  5:31 PM VBG with compensated respiratory acidosis.  Dimer is elevated to 2.5 and CTA pending. Also with elevated trop of 318.  ST depression on EKG which is new but will also repeat EKG.  Could be strain from pulmonary source or primary heart source.   6:40 PM Patient reporting that she does not know if she wants a CTA of her chest or all these medications.  She did take the albuterol and steroids but refused the magnesium.  Discussed the above findings with her granddaughter Glenard Haring who is her power of attorney. And she has convinced the pt to get the CTA and the magnesium.  Pt also hypertensive here and has not had her home meds.  She was given her home meds.  Final Clinical Impression(s) / ED Diagnoses Final diagnoses:  None    Rx / DC Orders ED Discharge Orders    None       Blanchie Dessert, MD 09/29/20 2030

## 2020-09-29 NOTE — ED Triage Notes (Signed)
Pt with hx of COPD, lung cancer, and lung removal here for eval of shob. Caregiver reports she sleeps with an external ventilator at night and this morning her O2 sats were 70% from 0300-0600. Reports periods of intermittent hypoxia, confusion, and lethargy recently. 100% on 3L in triage, A/O, NAD, but winded with much talking.

## 2020-09-29 NOTE — ED Notes (Signed)
The pt does not want the mag sulfate 2gms iv now  The pts granddaughter has walked back toi see bher

## 2020-09-29 NOTE — H&P (Signed)
History and Physical    [name] ZOX:[id number] DOB: [birth year] DOA: 09/29/2020  PCP: Jomarie Longs, PA-C  Patient coming from: Home  I have personally briefly reviewed patient's old medical records in [facility]  Chief Complaint: SOB  HPI: [name] is a 74 y.o. female with medical history significant of NSCLC in L lung s/p lobectomy in 2013, COPD on 3L home O2 and BIPAP QHS, HTN.  Pt presents to the ED with c/o SOB, onset a couple of days ago, progressively worsening in that time period.  Hypoxia last night to the 70s.  Home nebs not helping much.  Reported intermittent confusion and lethargy.  Has had COVID vaccine.  Husband had COVID 3 months ago.  No fever, CP, abd pain, LE edema.  ED Course: WBC 15k, Trop 318>389, BPs 220s systolic initially.  Didn't take home BP meds today.  Given home irbesartan and labetalol and now BP down to 150-170s systolic.  CTA chest: 1) no pe 2) RUL lung 1.1cm nodule and nodular opacities in RUL, RLL worrisome for malignancy vs infectious / inflammatory  Given neb treatments, Mg, steroids.  Satting 100% on 3L.   Review of Systems: As per HPI, otherwise all review of systems negative.  Past Medical History:  Diagnosis Date  . Bronchopleural fistula (HCC)   . CAD (coronary artery disease)    20% stenosis on cath - no intervention required (W-S cards), negative nuclear stress test 11/07  . Cancer [facility]) 2007   lung (Dr. Shirline Frees and Dr. Edwyna Shell)  . COPD (chronic obstructive pulmonary disease) (HCC) 9/10   golds stage III, FeV1 39%  . Hurthle cell adenoma of thyroid 2007  . Hypertension   . Iron deficiency anemia 2/08   s/p 2 unit transfusion  . Lung cancer (HCC)   . Peripheral vascular disease (HCC)   . RLS (restless legs syndrome)     Past Surgical History:  Procedure Laterality Date  . APPENDECTOMY  19  . CHOLECYSTECTOMY  18  . heart cartherization    . LUL  2005   LUL wedge resection/VATS  . LUL   4/08   LUL lobectomy for cystic cavity and Candida, no cancer seen  . THYROIDECTOMY, PARTIAL  2007   Dr. Edwyna Shell     reports that she quit smoking about 15 years ago. Her smoking use included cigarettes. She has a 45.00 pack-year smoking history. She has never used smokeless tobacco. She reports that she does not drink alcohol and does not use drugs.  Allergies  Allergen Reactions  . Desvenlafaxine Succinate Er Other (See Comments)    shaking  . Lisinopril-Hydrochlorothiazide     REACTION: N/V cough  . Nitrofurantoin Monohyd Macro   . Norvasc [Amlodipine]     Lower extremity edema  . Penicillins     REACTION: Hives  . Zoloft [Sertraline Hcl] Other (See Comments)    "too many side effects"    Family History  Problem Relation Age of Onset  . Cancer Mother        Larynx & Endometrial cancer  . Cancer Father        laryngeal cancer  . Hypertension Father   . Diabetes Sister   . Multiple sclerosis Sister   . Cancer Sister 12       breast     Prior to Admission medications   Medication Sig Start Date End Date Taking? Authorizing Provider  albuterol (PROVENTIL HFA;VENTOLIN HFA) 108 (90 Base) MCG/ACT inhaler Inhale 2 puffs  into the lungs every 6 (six) hours as needed for wheezing or shortness of breath. 06/23/17  Yes Oretha Milch, MD  ALPRAZolam Prudy Feeler) 1 MG tablet Take 1 tablet (1 mg total) by mouth daily as needed for anxiety. 07/20/20  Yes Breeback, Jade L, PA-C  AMBULATORY NON FORMULARY MEDICATION Hypoxia with positive walk test. O2 2L as needed for SOB and decreased O2 stats with exertion. Pt request portable O2 tanks only. 04/06/18  Yes Breeback, Jade L, PA-C  aspirin EC 81 MG tablet Take 81 mg by mouth daily.   Yes [provider]  atorvastatin (LIPITOR) 20 MG tablet Take 1 tablet (20 mg total) by mouth at bedtime. Labs for further refills 08/07/20  Yes Breeback, Jade L, PA-C  celecoxib (CELEBREX) 200 MG capsule Take 1 capsule (200 mg total) by mouth daily. 03/20/20  03/20/21 Yes Breeback, Jade L, PA-C  cloNIDine (CATAPRES - DOSED IN MG/24 HR) 0.3 mg/24hr patch Place 1 patch (0.3 mg total) onto the skin once a week. 07/20/20  Yes Breeback, Jade L, PA-C  Fluticasone-Umeclidin-Vilant (TRELEGY ELLIPTA) 100-62.5-25 MCG/INH AEPB Inhale 1 puff into the lungs daily. 08/08/20  Yes Breeback, Jade L, PA-C  ipratropium-albuterol (DUONEB) 0.5-2.5 (3) MG/3ML SOLN Take 3 mLs by nebulization every 4 (four) hours as needed. Patient taking differently: Take 3 mLs by nebulization every 4 (four) hours as needed (shotness of breath). 07/20/20  Yes Breeback, Jade L, PA-C  labetalol (NORMODYNE) 200 MG tablet Take 2 tablets (400 mg total) by mouth 2 (two) times daily. 07/20/20  Yes Breeback, Jade L, PA-C  levothyroxine (SYNTHROID) 50 MCG tablet take 1 tablet by mouth once daily 07/20/20  Yes Breeback, Jade L, PA-C  omeprazole (PRILOSEC) 40 MG capsule TAKE 1 CAPSULE(40 MG) BY MOUTH DAILY Patient taking differently: Take 40 mg by mouth daily. 12/30/19  Yes Breeback, Jade L, PA-C  QUEtiapine (SEROQUEL) 100 MG tablet Take 1 tablet (100 mg total) by mouth at bedtime. 01/30/20  Yes Breeback, Jade L, PA-C  valsartan (DIOVAN) 320 MG tablet Take 1 tablet (320 mg total) by mouth daily. 07/20/20  Yes Breeback, Jade L, PA-C  vortioxetine HBr (TRINTELLIX) 20 MG TABS tablet Take 1 tablet (20 mg total) by mouth daily. 07/20/20  Yes Jomarie Longs, PA-C    Physical Exam: Vitals:   09/29/20 2115 09/29/20 2130 09/29/20 2145 09/29/20 2200  BP: (!) 166/64 (!) 167/74 (!) 158/61 (!) 155/53  Pulse: 72 73 66 69  Resp: (!) 32 (!) 32 (!) 31 (!) 28  Temp:      TempSrc:      SpO2: 100% 100% 100% 100%    Constitutional: NAD, calm, comfortable Eyes: PERRL, lids and conjunctivae normal ENMT: Mucous membranes are moist. Posterior pharynx clear of any exudate or lesions.Normal dentition.  Neck: normal, supple, no masses, no thyromegaly Respiratory: Mild diffuse wheezing. Cardiovascular: Regular rate and rhythm,  no murmurs / rubs / gallops. No extremity edema. 2+ pedal pulses. No carotid bruits.  Abdomen: no tenderness, no masses palpated. No hepatosplenomegaly. Bowel sounds positive.  Musculoskeletal: no clubbing / cyanosis. No joint deformity upper and lower extremities. Good ROM, no contractures. Normal muscle tone.  Skin: no rashes, lesions, ulcers. No induration Neurologic: CN 2-12 grossly intact. Sensation intact, DTR normal. Strength 5/5 in all 4.  Psychiatric: Normal judgment and insight. Alert and oriented x 3. Normal mood.    Labs on Admission: I have personally reviewed following labs and imaging studies  CBC: Recent Labs  Lab 09/29/20 1407 09/29/20 1658  WBC 15.1*  --   HGB 9.4* 10.5*  HCT 31.6* 31.0*  MCV 97.5  --   PLT 362  --    Basic Metabolic Panel: Recent Labs  Lab 09/29/20 1407 09/29/20 1658  NA 137 134*  K 4.3 4.7  CL 89*  --   CO2 38*  --   GLUCOSE 166*  --   BUN 15  --   CREATININE 0.57  --   CALCIUM 9.3  --    GFR: CrCl cannot be calculated (Unknown ideal weight.). Liver Function Tests: No results for input(s): AST, ALT, ALKPHOS, BILITOT, PROT, ALBUMIN in the last 168 hours. No results for input(s): LIPASE, AMYLASE in the last 168 hours. No results for input(s): AMMONIA in the last 168 hours. Coagulation Profile: No results for input(s): INR, PROTIME in the last 168 hours. Cardiac Enzymes: No results for input(s): CKTOTAL, CKMB, CKMBINDEX, TROPONINI in the last 168 hours. BNP (last 3 results) No results for input(s): PROBNP in the last 8760 hours. HbA1C: No results for input(s): HGBA1C in the last 72 hours. CBG: No results for input(s): GLUCAP in the last 168 hours. Lipid Profile: No results for input(s): CHOL, HDL, LDLCALC, TRIG, CHOLHDL, LDLDIRECT in the last 72 hours. Thyroid Function Tests: No results for input(s): TSH, T4TOTAL, FREET4, T3FREE, THYROIDAB in the last 72 hours. Anemia Panel: No results for input(s): VITAMINB12, FOLATE,  FERRITIN, TIBC, IRON, RETICCTPCT in the last 72 hours. Urine analysis:    Component Value Date/Time   BILIRUBINUR negative 04/28/2017 1455   PROTEINUR negative 04/28/2017 1455   UROBILINOGEN 0.2 04/28/2017 1455   NITRITE negative 04/28/2017 1455   LEUKOCYTESUR Negative 04/28/2017 1455    Radiological Exams on Admission: DG Chest 2 View  Result Date: 09/29/2020 CLINICAL DATA:  Shortness of breath EXAM: CHEST - 2 VIEW COMPARISON:  01/26/2018 FINDINGS: Redemonstrated postoperative findings of left upper lobectomy with volume loss of the left hemithorax and leftward shift of the mediastinum. Hyperinflation and emphysema of the right lung. The visualized skeletal structures are unremarkable. IMPRESSION: Redemonstrated postoperative findings of left upper lobectomy. Hyperinflation and emphysema of the right lung. No acute abnormality of the lungs. Electronically Signed   By: Lauralyn Primes M.D.   On: 09/29/2020 15:08    EKG: Independently reviewed.  Assessment/Plan Principal Problem:   COPD exacerbation (HCC) Active Problems:   History of lung cancer   COPD with chronic bronchitis (HCC)   Chronic respiratory failure (HCC)   Right upper lobe pulmonary nodule   Elevated troponin   CAP (community acquired pneumonia)    1. COPD exacerbation - 1. COPD pathway 2. Prednisone 3. PRN SABA 4. Scheduled LABA, LAMA, and INH steroid 5. Cont pulse ox, satting 100% on her baseline 3L currently 6. Cont QHS trielegy 2. CAP vs Lung CA - 1. Trying to treat as CAP for now given recent acute pulmonary complaints, Tm 99.5, WBC 15.1k 2. Rocephin + Azithro 3. Consult pulm in AM as per radiology recommendations. 3. Chronic resp failure with hypoxia and hypercapnea -  1. Cont 3L O2 and QHS trielegy 4. Elevated troponin - 1. Possibly HTN urgency in setting of missed BP meds? 2. Tele monitor 3. EDP called cards, they were not impressed.  Recd re-echo. 4. No PE on CTA 5. No CP 5. HTN - 1. Resume all  home BP meds: catapres, ARB, labetalol  DVT prophylaxis: Lovenox Code Status: Full Family Communication: No family in room Disposition Plan: Home after respiratory status improved Consults called: EDP spoke with  cards, call pulm in AM Admission status: Place in obs  Charlee Whitebread M. DO [facility]  How to contact the [facility] Attending or Consulting provider 7A - 7P or covering provider during after hours 7P -7A, for this patient?  1. Check the care team in [facility] and look for a) attending/consulting [facility] provider listed and b) the [facility] team listed 2. Log into [email or web address]  Amion Physician Scheduling and messaging for groups and whole hospitals  On call and physician scheduling software for group practices, residents, hospitalists and other medical providers for call, clinic, rotation and shift schedules. OnCall [employer] is a hospital-wide system for scheduling doctors and paging doctors on call. EasyPlot is for scientific plotting and data analysis.  [email or web address]  and use [employer]'s universal password to access. If you do not have the password, please contact the hospital operator.  3. Locate the [facility] provider you are looking for under [facility] and page to a number that you can be directly reached. 4. If you still have difficulty reaching the provider, please page the [facility] (Director on Call) for the Hospitalists listed on amion for assistance.  09/29/2020, 10:37 PM

## 2020-09-30 ENCOUNTER — Observation Stay (HOSPITAL_BASED_OUTPATIENT_CLINIC_OR_DEPARTMENT_OTHER): Payer: Medicare Other

## 2020-09-30 DIAGNOSIS — J9620 Acute and chronic respiratory failure, unspecified whether with hypoxia or hypercapnia: Secondary | ICD-10-CM | POA: Diagnosis present

## 2020-09-30 DIAGNOSIS — Z20822 Contact with and (suspected) exposure to covid-19: Secondary | ICD-10-CM | POA: Diagnosis present

## 2020-09-30 DIAGNOSIS — E43 Unspecified severe protein-calorie malnutrition: Secondary | ICD-10-CM | POA: Diagnosis present

## 2020-09-30 DIAGNOSIS — Z87891 Personal history of nicotine dependence: Secondary | ICD-10-CM | POA: Diagnosis not present

## 2020-09-30 DIAGNOSIS — R911 Solitary pulmonary nodule: Secondary | ICD-10-CM | POA: Diagnosis not present

## 2020-09-30 DIAGNOSIS — Z8249 Family history of ischemic heart disease and other diseases of the circulatory system: Secondary | ICD-10-CM | POA: Diagnosis not present

## 2020-09-30 DIAGNOSIS — Z79899 Other long term (current) drug therapy: Secondary | ICD-10-CM | POA: Diagnosis not present

## 2020-09-30 DIAGNOSIS — R0602 Shortness of breath: Secondary | ICD-10-CM | POA: Diagnosis not present

## 2020-09-30 DIAGNOSIS — J9611 Chronic respiratory failure with hypoxia: Secondary | ICD-10-CM | POA: Diagnosis not present

## 2020-09-30 DIAGNOSIS — Z820 Family history of epilepsy and other diseases of the nervous system: Secondary | ICD-10-CM | POA: Diagnosis not present

## 2020-09-30 DIAGNOSIS — J9601 Acute respiratory failure with hypoxia: Secondary | ICD-10-CM | POA: Diagnosis not present

## 2020-09-30 DIAGNOSIS — I248 Other forms of acute ischemic heart disease: Secondary | ICD-10-CM | POA: Diagnosis present

## 2020-09-30 DIAGNOSIS — J441 Chronic obstructive pulmonary disease with (acute) exacerbation: Secondary | ICD-10-CM | POA: Diagnosis present

## 2020-09-30 DIAGNOSIS — I509 Heart failure, unspecified: Secondary | ICD-10-CM | POA: Diagnosis not present

## 2020-09-30 DIAGNOSIS — Z880 Allergy status to penicillin: Secondary | ICD-10-CM | POA: Diagnosis not present

## 2020-09-30 DIAGNOSIS — Z79890 Hormone replacement therapy (postmenopausal): Secondary | ICD-10-CM | POA: Diagnosis not present

## 2020-09-30 DIAGNOSIS — J9622 Acute and chronic respiratory failure with hypercapnia: Secondary | ICD-10-CM | POA: Diagnosis present

## 2020-09-30 DIAGNOSIS — J440 Chronic obstructive pulmonary disease with acute lower respiratory infection: Secondary | ICD-10-CM | POA: Diagnosis present

## 2020-09-30 DIAGNOSIS — R778 Other specified abnormalities of plasma proteins: Secondary | ICD-10-CM | POA: Diagnosis not present

## 2020-09-30 DIAGNOSIS — I10 Essential (primary) hypertension: Secondary | ICD-10-CM | POA: Diagnosis present

## 2020-09-30 DIAGNOSIS — I2510 Atherosclerotic heart disease of native coronary artery without angina pectoris: Secondary | ICD-10-CM | POA: Diagnosis present

## 2020-09-30 DIAGNOSIS — Z833 Family history of diabetes mellitus: Secondary | ICD-10-CM | POA: Diagnosis not present

## 2020-09-30 DIAGNOSIS — I340 Nonrheumatic mitral (valve) insufficiency: Secondary | ICD-10-CM | POA: Diagnosis not present

## 2020-09-30 DIAGNOSIS — J9600 Acute respiratory failure, unspecified whether with hypoxia or hypercapnia: Secondary | ICD-10-CM | POA: Diagnosis present

## 2020-09-30 DIAGNOSIS — I739 Peripheral vascular disease, unspecified: Secondary | ICD-10-CM | POA: Diagnosis present

## 2020-09-30 DIAGNOSIS — J189 Pneumonia, unspecified organism: Secondary | ICD-10-CM | POA: Diagnosis present

## 2020-09-30 DIAGNOSIS — R64 Cachexia: Secondary | ICD-10-CM | POA: Diagnosis present

## 2020-09-30 DIAGNOSIS — J9612 Chronic respiratory failure with hypercapnia: Secondary | ICD-10-CM | POA: Diagnosis not present

## 2020-09-30 DIAGNOSIS — I361 Nonrheumatic tricuspid (valve) insufficiency: Secondary | ICD-10-CM

## 2020-09-30 DIAGNOSIS — C3490 Malignant neoplasm of unspecified part of unspecified bronchus or lung: Secondary | ICD-10-CM | POA: Diagnosis not present

## 2020-09-30 DIAGNOSIS — G2581 Restless legs syndrome: Secondary | ICD-10-CM | POA: Diagnosis present

## 2020-09-30 DIAGNOSIS — Z85118 Personal history of other malignant neoplasm of bronchus and lung: Secondary | ICD-10-CM | POA: Diagnosis not present

## 2020-09-30 DIAGNOSIS — Z681 Body mass index (BMI) 19 or less, adult: Secondary | ICD-10-CM | POA: Diagnosis not present

## 2020-09-30 DIAGNOSIS — Z888 Allergy status to other drugs, medicaments and biological substances status: Secondary | ICD-10-CM | POA: Diagnosis not present

## 2020-09-30 DIAGNOSIS — J9621 Acute and chronic respiratory failure with hypoxia: Secondary | ICD-10-CM | POA: Diagnosis present

## 2020-09-30 DIAGNOSIS — Z7982 Long term (current) use of aspirin: Secondary | ICD-10-CM | POA: Diagnosis not present

## 2020-09-30 HISTORY — DX: Acute respiratory failure, unspecified whether with hypoxia or hypercapnia: J96.00

## 2020-09-30 LAB — BASIC METABOLIC PANEL
Anion gap: 11 (ref 5–15)
BUN: 22 mg/dL (ref 8–23)
CO2: 40 mmol/L — ABNORMAL HIGH (ref 22–32)
Calcium: 9.4 mg/dL (ref 8.9–10.3)
Chloride: 87 mmol/L — ABNORMAL LOW (ref 98–111)
Creatinine, Ser: 0.61 mg/dL (ref 0.44–1.00)
GFR, Estimated: >60 mL/min (ref >60)
Glucose, Bld: 147 mg/dL — ABNORMAL HIGH (ref 70–99)
Potassium: 5 mmol/L (ref 3.5–5.1)
Sodium: 138 mmol/L (ref 135–145)

## 2020-09-30 LAB — CBC
HCT: 28.3 % — ABNORMAL LOW (ref 36.0–46.0)
Hemoglobin: 9.1 g/dL — ABNORMAL LOW (ref 12.0–15.0)
MCH: 30.3 pg (ref 26.0–34.0)
MCHC: 32.2 g/dL (ref 30.0–36.0)
MCV: 94.3 fL (ref 80.0–100.0)
Platelets: 297 10*3/uL (ref 150–400)
RBC: 3 MIL/uL — ABNORMAL LOW (ref 3.87–5.11)
RDW: 13.1 % (ref 11.5–15.5)
WBC: 8.2 10*3/uL (ref 4.0–10.5)
nRBC: 0 % (ref 0.0–0.2)

## 2020-09-30 LAB — ECHOCARDIOGRAM COMPLETE
Area-P 1/2: 3.65 cm2
Height: 64 in
S' Lateral: 3 cm
Weight: 1562.62 oz

## 2020-09-30 LAB — HIV ANTIBODY (ROUTINE TESTING W REFLEX): HIV Screen 4th Generation wRfx: NONREACTIVE

## 2020-09-30 MED ORDER — AEROCHAMBER PLUS FLO-VU LARGE MISC: 1.0000 | Freq: Once | Status: DC

## 2020-09-30 NOTE — Progress Notes (Signed)
PT Cancellation Note  Patient Details Name: [name] MRN: [id number] DOB: [birth year]   Cancelled Treatment:    Reason Eval/Treat Not Completed: Patient declined, no reason specified. Will continue attempts.    Shary Decamp [facility] 09/30/2020, 10:03 AM [place] Pager [phone] Office [phone]

## 2020-09-30 NOTE — ED Notes (Signed)
Pt assisted to bedside commode

## 2020-09-30 NOTE — Evaluation (Signed)
Physical Therapy Evaluation Patient Details Name: [name] MRN: [id number] DOB: 1946/09/10 Today's Date: 09/30/2020   History of Present Illness  Pt adm with copd exacerbation. PMH - lung CA, copd, htn.  Clinical Impression  Pt presents to PT with limited mobility due to poor activity tolerance. Session also limited by pt's extended time on bsc. Lives with family and expect will be able to return there with some support. Pt also appears a little confused.     Follow Up Recommendations Home health PT;Supervision for mobility/OOB    Equipment Recommendations  None recommended by PT    Recommendations for Other Services       Precautions / Restrictions Precautions Precautions: Fall      Mobility  Bed Mobility Overal bed mobility: Modified Independent                  Transfers Overall transfer level: Needs assistance Equipment used: 1 person hand held assist Transfers: Sit to/from Stand;Stand Pivot Transfers Sit to Stand: Min assist Stand pivot transfers: Min assist       General transfer comment: Assist for balance. Bed to bsc with pivotal steps  Ambulation/Gait Ambulation/Gait assistance: Min assist Gait Distance (Feet): 3 Feet Assistive device: 4-wheeled walker Gait Pattern/deviations: Step-to pattern;Decreased step length - right;Decreased step length - left;Shuffle;Trunk flexed Gait velocity: decr Gait velocity interpretation: <1.31 ft/sec, indicative of household ambulator General Gait Details: Assist for balance. Pt only torated amb from bsc a few feet to the recliner  Stairs            Wheelchair Mobility    Modified Rankin (Stroke Patients Only)       Balance Overall balance assessment: Needs assistance Sitting-balance support: No upper extremity supported;Feet supported Sitting balance-Leahy Scale: Good     Standing balance support: Single extremity supported Standing balance-Leahy Scale: Poor Standing balance comment:  UE support. Stood and performed pericare multiple times                             Pertinent Vitals/Pain Pain Assessment: No/denies pain    Home Living Family/patient expects to be discharged to:: Private residence Living Arrangements: Children Available Help at Discharge: Family Type of Home: House Home Access: Level entry     Home Layout: [place]: Clinical cytogeneticist - 4 wheels;Bedside commode      Prior Function Level of Independence: Needs assistance   Gait / Transfers Assistance Needed: modified independent. Uses rollator if out of home           Hand Dominance        Extremity/Trunk Assessment   Upper Extremity Assessment Upper Extremity Assessment: Defer to OT evaluation    Lower Extremity Assessment Lower Extremity Assessment: Generalized weakness       Communication   Communication: No difficulties  Cognition Arousal/Alertness: Awake/alert Behavior During Therapy: WFL for tasks assessed/performed Overall Cognitive Status: No family/caregiver present to determine baseline cognitive functioning Area of Impairment: Memory;Problem solving                     Memory: Decreased short-term memory       Problem Solving: Requires verbal cues;Requires tactile cues;Slow processing;Difficulty sequencing        General Comments General comments (skin integrity, edema, etc.): Pt on 3-4L of O2. Unable to get pulse ox reading. Dyspnea 3/4 with activity    Exercises     Assessment/Plan    PT Assessment Patient  needs continued PT services  PT Problem List Decreased strength;Decreased activity tolerance;Decreased balance;Decreased mobility       PT Treatment Interventions DME instruction;Gait training;Functional mobility training;Therapeutic activities;Therapeutic exercise;Patient/family education    PT Goals (Current goals can be found in the Care Plan section)  Acute Rehab PT Goals Patient Stated Goal: go  home PT Goal Formulation: With patient Time For Goal Achievement: 10/07/20 Potential to Achieve Goals: Good    Frequency Min 3X/week   Barriers to discharge        Co-evaluation               AM-PAC PT "6 Clicks" Mobility  Outcome Measure Help needed turning from your back to your side while in a flat bed without using bedrails?: None Help needed moving from lying on your back to sitting on the side of a flat bed without using bedrails?: None Help needed moving to and from a bed to a chair (including a wheelchair)?: A Little Help needed standing up from a chair using your arms (e.g., wheelchair or bedside chair)?: A Little Help needed to walk in hospital room?: A Little Help needed climbing 3-5 steps with a railing? : Total 6 Click Score: 18    End of Session Equipment Utilized During Treatment: Oxygen Activity Tolerance: Patient limited by fatigue Patient left: in chair;with call bell/phone within reach;with chair alarm set Nurse Communication: Mobility status;Other (comment) (pt requesting new gown, pt with purewick currently out, pulse ox on telemetry working) PT Visit Diagnosis: Unsteadiness on feet (R26.81);Muscle weakness (generalized) (M62.81)    Time: 2924-4628 (Out of room x 10 min with pt on bsc) PT Time Calculation (min) (ACUTE ONLY): 47 min   Charges:   PT Evaluation $PT Eval Moderate Complexity: 1 Mod PT Treatments $Therapeutic Activity: 8-22 mins        [place] Pager [phone] Office [place] 09/30/2020, 4:31 PM

## 2020-09-30 NOTE — Progress Notes (Signed)
[facility] PROGRESS NOTE   [name] YQM:[id number] DOB: 03/08/1946 DOA: 09/29/2020  PCP: Donella Stade, PA-C  Brief History/Interval Summary:  74 y.o. female with medical history significant of NSCLC in L lung s/p lobectomy in 2013, COPD on 3L home O2 and BIPAP QHS, HTN.  Patient presented to the emergency department with complaints of shortness of breath ongoing for about 2 days.  Worse than her baseline.  Noted to be hypoxic as well.  Noted to have elevated WBC.  CT scan raise concern for nodular opacities in the lung.  Reason for Visit: Acute on chronic respiratory failure with hypoxia  Consultants: None yet  Procedures: None  Antibiotics: Anti-infectives (From admission, onward)   Start     Dose/Rate Route Frequency Ordered Stop   09/30/20 2200  azithromycin (ZITHROMAX) tablet 250 mg       "Followed by" Linked Group Details   250 mg Oral Daily at bedtime 09/29/20 2220 10/04/20 2159   09/29/20 2300  cefTRIAXone (ROCEPHIN) 2 g in sodium chloride 0.9 % 100 mL IVPB        2 g 200 mL/hr over 30 Minutes Intravenous Every 24 hours 09/29/20 2220 10/04/20 2259   09/29/20 2300  azithromycin (ZITHROMAX) tablet 500 mg       "Followed by" Linked Group Details   500 mg Oral NOW 09/29/20 2220 09/30/20 0023      Subjective/Interval History: Patient poor historian.  States that she is feeling slightly better.  Still short of breath.  Denies any cough.  Denies any chest pain.  No nausea vomiting.    Assessment/Plan:  COPD with acute exacerbation/acute on chronic respiratory failure with hypoxia Patient placed on steroids nebulizer treatments.  Inhalers being continued.  Seems to be stable from a respiratory standpoint.  She is at her baseline oxygen requirement of 3 L/min.  Apparently uses trilogy ventilator at night times.  Non-small cell lung cancer status post lobectomy in 2013 CT angiogram raise concern for interval increase in size of right upper lobe nodule  as well as interval development of nodular-like peribronchovascular subsolid opacities within the left lower lobe and right upper lobe.  Will need to discuss with pulmonology.  Patient previously seen by Dr. Lamonte Sakai.  Will involve pulmonology tomorrow.  Concern for community-acquired pneumonia Findings noted on the CT scan could either be due to recurrence of her cancer or infection.  So patient is also on antibacterials currently in the form of ceftriaxone and azithromycin.    Mildly elevated troponin Patient denies any chest pain.  EKG did not show any acute ischemic changes.  Likely mild demand ischemia.  Echocardiogram showed normal systolic function.  Grade 2 diastolic dysfunction noted.  No regional wall motion abnormalities.  No significant valvular abnormalities.  Elevated pulmonary pressures were noted likely due to her underlying COPD.  Do not anticipate any further work-up in the hospital.  Essential hypertension Continue current medications.  Monitor blood pressures closely.  Normocytic anemia No evidence of overt bleeding.  Monitor hemoglobin closely.  Previous history of pulmonary embolism This was back in 2019.  She has completed course of anticoagulation.   DVT Prophylaxis: On Lovenox Code Status: Full code Family Communication: We will update family later today Disposition Plan: PT and OT evaluation.  Hopefully return home when improved.  Status is: Observation  The patient will require care spanning > 2 midnights and should be moved to inpatient because: Ongoing diagnostic testing needed not appropriate for outpatient work up, IV treatments  appropriate due to intensity of illness or inability to take PO and Inpatient level of care appropriate due to severity of illness  Dispo: The patient is from: Home              Anticipated d/c is to: Home              Anticipated d/c date is: 2 days              Patient currently is not medically stable to d/c.     Medications:   Scheduled: Marland Kitchen AeroChamber Plus Flo-Vu Large  1 each Other Once  . AeroChamber Plus Flo-Vu Medium  1 each Other Once  . aspirin EC  81 mg Oral Daily  . atorvastatin  20 mg Oral QHS  . azithromycin  250 mg Oral QHS  . celecoxib  200 mg Oral Daily  . cloNIDine  0.3 mg Transdermal Weekly  . enoxaparin (LOVENOX) injection  40 mg Subcutaneous QHS  . irbesartan  300 mg Oral QHS  . labetalol  400 mg Oral BID  . levothyroxine  50 mcg Oral Q0600  . mometasone-formoterol  2 puff Inhalation BID  . predniSONE  40 mg Oral Q breakfast  . QUEtiapine  100 mg Oral QHS  . umeclidinium bromide  1 puff Inhalation Daily  . vortioxetine HBr  20 mg Oral Daily   Continuous: . cefTRIAXone (ROCEPHIN)  IV Stopped (09/30/20 0058)   NTI:[id number], ipratropium-albuterol, levalbuterol   Objective:  Vital Signs  Vitals:   09/30/20 0245 09/30/20 0252 09/30/20 0311 09/30/20 0900  BP: (!) 156/51  (!) 147/56   Pulse: 68  66   Resp: (!) 29  16   Temp:  97.9 F (36.6 C) 98.2 F (36.8 C)   TempSrc:  Oral Oral   SpO2: 96% 93% 100% 99%  Weight:   44.3 kg   Height:   5\' 4"  (1.626 m)     Intake/Output Summary (Last 24 hours) at 09/30/2020 1114 Last data filed at 09/30/2020 0328 Gross per 24 hour  Intake 390.64 ml  Output --  Net 390.64 ml   Filed Weights   09/30/20 0311  Weight: 44.3 kg    General appearance: Awake alert.  In no distress.  Seems to be mildly distracted Resp: Mildly tachypneic at rest.  Coarse breath sounds with crackles bilateral bases and also in the left upper lobe. Cardio: S1-S2 is normal regular.  No S3-S4.  No rubs murmurs or bruit GI: Abdomen is soft.  Nontender nondistended.  Bowel sounds are present normal.  No masses organomegaly Extremities: No edema.  Neurologic:   No focal neurological deficits.    Lab Results:  Data Reviewed: I have personally reviewed following labs and imaging studies  CBC: Recent Labs  Lab 09/29/20 1407 09/29/20 1658 09/30/20 0407  WBC  15.1*  --  8.2  HGB 9.4* 10.5* 9.1*  HCT 31.6* 31.0* 28.3*  MCV 97.5  --  94.3  PLT 362  --  676    Basic Metabolic Panel: Recent Labs  Lab 09/29/20 1407 09/29/20 1658 09/30/20 0407  NA 137 134* 138  K 4.3 4.7 5.0  CL 89*  --  87*  CO2 38*  --  40*  GLUCOSE 166*  --  147*  BUN 15  --  22  CREATININE 0.57  --  0.61  CALCIUM 9.3  --  9.4    GFR: Estimated Creatinine Clearance: 43.8 mL/min (by C-G formula based on SCr of 0.61 mg/dL).  Recent Results (from the past 240 hour(s))  Resp Panel by RT-PCR (Flu A&B, Covid) Nasopharyngeal Swab     Status: None   Collection Time: 09/29/20  4:21 PM   Specimen: Nasopharyngeal Swab; Nasopharyngeal(NP) swabs in vial transport medium  Result Value Ref Range Status   SARS Coronavirus 2 by RT PCR NEGATIVE NEGATIVE Final    Comment: (NOTE) SARS-CoV-2 target nucleic acids are NOT DETECTED.  The SARS-CoV-2 RNA is generally detectable in upper respiratory specimens during the acute phase of infection. The lowest concentration of SARS-CoV-2 viral copies this assay can detect is 138 copies/mL. A negative result does not preclude SARS-Cov-2 infection and should not be used as the sole basis for treatment or other patient management decisions. A negative result may occur with  improper specimen collection/handling, submission of specimen other than nasopharyngeal swab, presence of viral mutation(s) within the areas targeted by this assay, and inadequate number of viral copies(<138 copies/mL). A negative result must be combined with clinical observations, patient history, and epidemiological information. The expected result is Negative.  Fact Sheet for Patients:  EntrepreneurPulse.com.au  Fact Sheet for Healthcare Providers:  IncredibleEmployment.be  This test is no t yet approved or cleared by the [place] FDA and  has been authorized for detection and/or diagnosis of SARS-CoV-2 by FDA under an  Emergency Use Authorization (EUA). This EUA will remain  in effect (meaning this test can be used) for the duration of the COVID-19 declaration under Section 564(b)(1) of the Act, 21 U.S.C.section 360bbb-3(b)(1), unless the authorization is terminated  or revoked sooner.       Influenza A by PCR NEGATIVE NEGATIVE Final   Influenza B by PCR NEGATIVE NEGATIVE Final    Comment: (NOTE) The Xpert Xpress SARS-CoV-2/FLU/RSV plus assay is intended as an aid in the diagnosis of influenza from Nasopharyngeal swab specimens and should not be used as a sole basis for treatment. Nasal washings and aspirates are unacceptable for Xpert Xpress SARS-CoV-2/FLU/RSV testing.  Fact Sheet for Patients: EntrepreneurPulse.com.au  Fact Sheet for Healthcare Providers: IncredibleEmployment.be  This test is not yet approved or cleared by the [place] FDA and has been authorized for detection and/or diagnosis of SARS-CoV-2 by FDA under an Emergency Use Authorization (EUA). This EUA will remain in effect (meaning this test can be used) for the duration of the COVID-19 declaration under Section 564(b)(1) of the Act, 21 U.S.C. section 360bbb-3(b)(1), unless the authorization is terminated or revoked.  Performed at [facility], [place] [address] [place], [place] [address]       Radiology Studies: DG Chest 2 View  Result Date: 09/29/2020 CLINICAL DATA:  Shortness of breath EXAM: CHEST - 2 VIEW COMPARISON:  01/26/2018 FINDINGS: Redemonstrated postoperative findings of left upper lobectomy with volume loss of the left hemithorax and leftward shift of the mediastinum. Hyperinflation and emphysema of the right lung. The visualized skeletal structures are unremarkable. IMPRESSION: Redemonstrated postoperative findings of left upper lobectomy. Hyperinflation and emphysema of the right lung. No acute abnormality of the lungs. Electronically Signed   By: Eddie Candle M.D.    On: 09/29/2020 15:08   CT Angio Chest PE W and/or Wo Contrast  Result Date: 09/29/2020 CLINICAL DATA:  Shortness of breath. COPD. History of lung cancer status post left upper lobectomy. 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 EXAM: CT ANGIOGRAPHY CHEST WITH CONTRAST TECHNIQUE: Multidetector CT imaging of the chest was performed using the standard protocol during bolus administration of intravenous contrast. Multiplanar CT image  reconstructions and MIPs were obtained to evaluate the vascular anatomy. CONTRAST:  44mL OMNIPAQUE IOHEXOL 350 MG/ML SOLN COMPARISON:  PET CT 4/27/7, CT angio chest 01/27/2018. FINDINGS: Cardiovascular: Satisfactory opacification of the pulmonary arteries to the segmental level. No evidence of pulmonary embolism. The main pulmonary artery is enlarged measuring up to 3.1 cm. Normal heart size. No significant pericardial effusion. The thoracic aorta is normal in caliber. Aortic valve leaflet calcifications-mild. Moderate atherosclerotic plaque of the thoracic aorta. At least mild 2 vessel coronary artery calcifications. Mediastinum/Nodes: Enlarged prevascular lymph node measuring up to 1 cm (5:50). Enlarged 1 cm right hilar lymph node (5: 66). No definite no axillary lymph nodes. Thyroid gland, trachea, and esophagus demonstrate no significant findings. Lungs/Pleura: Status post left upper lobectomy. Severe centrilobular emphysematous changes. Bilateral lower lobe calcified nodules likely representing granulomas. Interval development of left lower lobe peribronchovascular nodular-like subsolid densities with as an example a 1.2 cm lesion (6:76) and a 1 cm lesion of lesion (6: 94). Interval increase in size of a right upper lobe subsolid nodular-like density measuring 1.1 cm (6:40). Interval development of several of the right upper lobe areas of subsolid opacities more inferiorly (as an example: 6:36). A 5 mm right lower lobe ground-glass  pulmonary nodule (6:97). No pleural effusion. No pneumothorax. Upper Abdomen: No acute abnormality. Musculoskeletal: No abdominal wall hernia or abnormality No suspicious lytic or blastic osseous lesions. Similar-appearing cortical irregularity of the left upper ribs. No acute displaced fracture. Multilevel degenerative changes of the spine. Review of the MIP images confirms the above findings. IMPRESSION: 1. Interval increase in size of a right upper lobe 1.1 cm subsolid nodular-like opacity as well as interval development of nodular-like peribronchovascular subsolid opacities within the left lower lobe and right upper lobe. Findings suspicious for malignancy (such as adenocarcinoma) versus infection/inflammation. Additional imaging evaluation or consultation with Pulmonology or Thoracic Surgery recommended. 2. Borderline enlarged prevascular and right hilar lymph nodes likely reactive in etiology. 3. Borderline enlarged main pulmonary artery. Correlate with pulmonary hypertension. 4. Aortic Atherosclerosis (ICD10-I70.0) and Emphysema (ICD10-J43.9). Electronically Signed   By: Iven Finn M.D.   On: 09/29/2020 21:02   ECHOCARDIOGRAM COMPLETE  Result Date: 09/30/2020    ECHOCARDIOGRAM REPORT   Patient Name:   [facility] Date of Exam: 09/30/2020 Medical Rec #:  [id number]      Height:       64.0 in Accession #:    [id number]     Weight:       97.7 lb Date of Birth:  Sep 26, 1946      BSA:          1.443 m Patient Age:    49 years       BP:           147/56 mmHg Patient Gender: F              HR:           74 bpm. Exam Location:  Inpatient Procedure: 2D Echo, Cardiac Doppler and Color Doppler Indications:    Elevated Troponin.  History:        Patient has prior history of Echocardiogram examinations, most                 recent 02/05/2018. CAD, COPD; Risk Factors:Hypertension. Cancer.  Sonographer:    Jonelle Sidle Dance Referring Phys: [place]  1. Left ventricular ejection fraction, by  estimation, is 55 to 60%. The left ventricle has normal function. The left ventricle  has no regional wall motion abnormalities. Left ventricular diastolic parameters are consistent with Grade II diastolic dysfunction (pseudonormalization). Elevated left ventricular end-diastolic pressure.  2. Right ventricular systolic function is normal. The right ventricular size is normal. There is mildly elevated pulmonary artery systolic pressure.  3. Left atrial size was mildly dilated.  4. The mitral valve is normal in structure. Mild mitral valve regurgitation. No evidence of mitral stenosis.  5. Nodular calcification of non coronary cusp. The aortic valve is tricuspid. Aortic valve regurgitation is not visualized. Mild to moderate aortic valve sclerosis/calcification is present, without any evidence of aortic stenosis.  6. The inferior vena cava is normal in size with greater than 50% respiratory variability, suggesting right atrial pressure of 3 mmHg. FINDINGS  Left Ventricle: Left ventricular ejection fraction, by estimation, is 55 to 60%. The left ventricle has normal function. The left ventricle has no regional wall motion abnormalities. The left ventricular internal cavity size was normal in size. There is  no left ventricular hypertrophy. Left ventricular diastolic parameters are consistent with Grade II diastolic dysfunction (pseudonormalization). Elevated left ventricular end-diastolic pressure. Right Ventricle: The right ventricular size is normal. No increase in right ventricular wall thickness. Right ventricular systolic function is normal. There is mildly elevated pulmonary artery systolic pressure. The tricuspid regurgitant velocity is 2.70  m/s, and with an assumed right atrial pressure of 8 mmHg, the estimated right ventricular systolic pressure is 44.8 mmHg. Left Atrium: Left atrial size was mildly dilated. Right Atrium: Right atrial size was normal in size. Pericardium: There is no evidence of pericardial  effusion. Mitral Valve: The mitral valve is normal in structure. Mild mitral valve regurgitation. No evidence of mitral valve stenosis. Tricuspid Valve: The tricuspid valve is normal in structure. Tricuspid valve regurgitation is mild . No evidence of tricuspid stenosis. Aortic Valve: Nodular calcification of non coronary cusp. The aortic valve is tricuspid. Aortic valve regurgitation is not visualized. Mild to moderate aortic valve sclerosis/calcification is present, without any evidence of aortic stenosis. Pulmonic Valve: The pulmonic valve was normal in structure. Pulmonic valve regurgitation is not visualized. No evidence of pulmonic stenosis. Aorta: The aortic root is normal in size and structure. Venous: The inferior vena cava is normal in size with greater than 50% respiratory variability, suggesting right atrial pressure of 3 mmHg. IAS/Shunts: No atrial level shunt detected by color flow Doppler.  LEFT VENTRICLE PLAX 2D LVIDd:         4.40 cm  Diastology LVIDs:         3.00 cm  LV e' medial:    6.20 cm/s LV PW:         1.00 cm  LV E/e' medial:  18.4 LV IVS:        1.00 cm  LV e' lateral:   6.09 cm/s LVOT diam:     1.80 cm  LV E/e' lateral: 18.7 LV SV:         64 LV SV Index:   45 LVOT Area:     2.54 cm  RIGHT VENTRICLE             IVC RV Basal diam:  2.40 cm     IVC diam: 2.40 cm RV S prime:     12.10 cm/s TAPSE (M-mode): 2.0 cm LEFT ATRIUM             Index       RIGHT ATRIUM           Index LA diam:  4.10 cm 2.84 cm/m  RA Area:     10.50 cm LA Vol (A2C):   78.8 ml 54.62 ml/m RA Volume:   22.20 ml  15.39 ml/m LA Vol (A4C):   76.8 ml 53.24 ml/m LA Biplane Vol: 80.7 ml 55.94 ml/m  AORTIC VALVE LVOT Vmax:   97.90 cm/s LVOT Vmean:  66.200 cm/s LVOT VTI:    0.253 m  AORTA Ao Root diam: 2.80 cm Ao Asc diam:  2.90 cm MITRAL VALVE                TRICUSPID VALVE MV Area (PHT): 3.65 cm     TR Peak grad:   29.2 mmHg MV Decel Time: 208 msec     TR Vmax:        270.00 cm/s MV E velocity: 114.00 cm/s MV A  velocity: 108.00 cm/s  SHUNTS MV E/A ratio:  1.06         Systemic VTI:  0.25 m                             Systemic Diam: 1.80 cm Jenkins Rouge MD Electronically signed by Jenkins Rouge MD Signature Date/Time: 09/30/2020/10:09:04 AM    Final        LOS: 0 days   Bonnielee Haff  [facility] Pager on [email or web address]  09/30/2020, 11:14 AM

## 2020-09-30 NOTE — ED Notes (Signed)
Pt O2 sat dropped down to 68% when patient stood, took 2 steps, and sat on bed. Pt placed on non rebreather at 15 lpm to get O2 increased. O2 increased to 95% on non rebreather and pt changed to [place] at 4lpm.

## 2020-09-30 NOTE — Progress Notes (Signed)
  Echocardiogram 2D Echocardiogram has been performed.  [name] 09/30/2020, 8:51 AM

## 2020-09-30 NOTE — Plan of Care (Signed)
Problem: Education: Goal: Knowledge of General Education information will improve Description: Including pain rating scale, medication(s)/side effects and non-pharmacologic comfort measures 09/30/2020 1025 by Arlina Robes, RN Outcome: Progressing 09/30/2020 1025 by Arlina Robes, RN Outcome: Progressing 09/30/2020 1023 by Arlina Robes, RN Outcome: Progressing   Problem: Health Behavior/Discharge Planning: Goal: Ability to manage health-related needs will improve 09/30/2020 1025 by Arlina Robes, RN Outcome: Progressing 09/30/2020 1025 by Arlina Robes, RN Outcome: Progressing 09/30/2020 1023 by Arlina Robes, RN Outcome: Progressing   Problem: Clinical Measurements: Goal: Ability to maintain clinical measurements within normal limits will improve 09/30/2020 1025 by Arlina Robes, RN Outcome: Progressing 09/30/2020 1025 by Arlina Robes, RN Outcome: Progressing 09/30/2020 1023 by Arlina Robes, RN Outcome: Progressing Goal: Will remain free from infection 09/30/2020 1025 by Arlina Robes, RN Outcome: Progressing 09/30/2020 1025 by Arlina Robes, RN Outcome: Progressing 09/30/2020 1023 by Arlina Robes, RN Outcome: Progressing Goal: Diagnostic test results will improve 09/30/2020 1025 by Arlina Robes, RN Outcome: Progressing 09/30/2020 1025 by Arlina Robes, RN Outcome: Progressing 09/30/2020 1023 by Arlina Robes, RN Outcome: Progressing Goal: Respiratory complications will improve 09/30/2020 1025 by Arlina Robes, RN Outcome: Progressing 09/30/2020 1025 by Arlina Robes, RN Outcome: Progressing 09/30/2020 1023 by Arlina Robes, RN Outcome: Progressing Goal: Cardiovascular complication will be avoided 09/30/2020 1025 by Arlina Robes, RN Outcome: Progressing 09/30/2020 1025 by Arlina Robes, RN Outcome: Progressing 09/30/2020 1023 by Arlina Robes,  RN Outcome: Progressing   Problem: Activity: Goal: Risk for activity intolerance will decrease 09/30/2020 1025 by Arlina Robes, RN Outcome: Progressing 09/30/2020 1025 by Arlina Robes, RN Outcome: Progressing 09/30/2020 1023 by Arlina Robes, RN Outcome: Progressing   Problem: Nutrition: Goal: Adequate nutrition will be maintained 09/30/2020 1025 by Arlina Robes, RN Outcome: Progressing 09/30/2020 1025 by Arlina Robes, RN Outcome: Progressing 09/30/2020 1023 by Arlina Robes, RN Outcome: Progressing   Problem: Coping: Goal: Level of anxiety will decrease 09/30/2020 1025 by Arlina Robes, RN Outcome: Progressing 09/30/2020 1025 by Arlina Robes, RN Outcome: Progressing 09/30/2020 1023 by Arlina Robes, RN Outcome: Progressing   Problem: Elimination: Goal: Will not experience complications related to bowel motility 09/30/2020 1025 by Arlina Robes, RN Outcome: Progressing 09/30/2020 1025 by Arlina Robes, RN Outcome: Progressing 09/30/2020 1023 by Arlina Robes, RN Outcome: Progressing Goal: Will not experience complications related to urinary retention 09/30/2020 1025 by Arlina Robes, RN Outcome: Progressing 09/30/2020 1025 by Arlina Robes, RN Outcome: Progressing 09/30/2020 1023 by Arlina Robes, RN Outcome: Progressing   Problem: Pain Managment: Goal: General experience of comfort will improve 09/30/2020 1025 by Arlina Robes, RN Outcome: Progressing 09/30/2020 1025 by Arlina Robes, RN Outcome: Progressing 09/30/2020 1023 by Arlina Robes, RN Outcome: Progressing   Problem: Safety: Goal: Ability to remain free from injury will improve 09/30/2020 1025 by Arlina Robes, RN Outcome: Progressing 09/30/2020 1025 by Arlina Robes, RN Outcome: Progressing 09/30/2020 1023 by Arlina Robes, RN Outcome: Progressing   Problem: Skin Integrity: Goal:  Risk for impaired skin integrity will decrease 09/30/2020 1025 by Arlina Robes, RN Outcome: Progressing 09/30/2020 1025 by Arlina Robes, RN Outcome: Progressing 09/30/2020 1023 by Arlina Robes, RN Outcome: Progressing   Problem: Activity: Goal: Ability to tolerate increased activity will improve Outcome: Progressing   Problem: Clinical Measurements: Goal: Ability to maintain a body temperature in the  normal range will improve Outcome: Progressing   Problem: Respiratory: Goal: Ability to maintain adequate ventilation will improve Outcome: Progressing Goal: Ability to maintain a clear airway will improve Outcome: Progressing

## 2020-09-30 NOTE — Plan of Care (Signed)
Problem: Education: Goal: Knowledge of General Education information will improve Description: Including pain rating scale, medication(s)/side effects and non-pharmacologic comfort measures 09/30/2020 1027 by Arlina Robes, RN Outcome: Progressing 09/30/2020 1027 by Arlina Robes, RN Outcome: Progressing 09/30/2020 1025 by Arlina Robes, RN Outcome: Progressing 09/30/2020 1025 by Arlina Robes, RN Outcome: Progressing 09/30/2020 1023 by Arlina Robes, RN Outcome: Progressing   Problem: Health Behavior/Discharge Planning: Goal: Ability to manage health-related needs will improve 09/30/2020 1027 by Arlina Robes, RN Outcome: Progressing 09/30/2020 1027 by Arlina Robes, RN Outcome: Progressing 09/30/2020 1025 by Arlina Robes, RN Outcome: Progressing 09/30/2020 1025 by Arlina Robes, RN Outcome: Progressing 09/30/2020 1023 by Arlina Robes, RN Outcome: Progressing   Problem: Clinical Measurements: Goal: Ability to maintain clinical measurements within normal limits will improve 09/30/2020 1027 by Arlina Robes, RN Outcome: Progressing 09/30/2020 1027 by Arlina Robes, RN Outcome: Progressing 09/30/2020 1025 by Arlina Robes, RN Outcome: Progressing 09/30/2020 1025 by Arlina Robes, RN Outcome: Progressing 09/30/2020 1023 by Arlina Robes, RN Outcome: Progressing Goal: Will remain free from infection 09/30/2020 1027 by Arlina Robes, RN Outcome: Progressing 09/30/2020 1027 by Arlina Robes, RN Outcome: Progressing 09/30/2020 1025 by Arlina Robes, RN Outcome: Progressing 09/30/2020 1025 by Arlina Robes, RN Outcome: Progressing 09/30/2020 1023 by Arlina Robes, RN Outcome: Progressing Goal: Diagnostic test results will improve 09/30/2020 1027 by Arlina Robes, RN Outcome: Progressing 09/30/2020 1027 by Arlina Robes, RN Outcome: Progressing 09/30/2020  1025 by Arlina Robes, RN Outcome: Progressing 09/30/2020 1025 by Arlina Robes, RN Outcome: Progressing 09/30/2020 1023 by Arlina Robes, RN Outcome: Progressing Goal: Respiratory complications will improve 09/30/2020 1027 by Arlina Robes, RN Outcome: Progressing 09/30/2020 1027 by Arlina Robes, RN Outcome: Progressing 09/30/2020 1025 by Arlina Robes, RN Outcome: Progressing 09/30/2020 1025 by Arlina Robes, RN Outcome: Progressing 09/30/2020 1023 by Arlina Robes, RN Outcome: Progressing Goal: Cardiovascular complication will be avoided 09/30/2020 1027 by Arlina Robes, RN Outcome: Progressing 09/30/2020 1027 by Arlina Robes, RN Outcome: Progressing 09/30/2020 1025 by Arlina Robes, RN Outcome: Progressing 09/30/2020 1025 by Arlina Robes, RN Outcome: Progressing 09/30/2020 1023 by Arlina Robes, RN Outcome: Progressing   Problem: Activity: Goal: Risk for activity intolerance will decrease 09/30/2020 1027 by Arlina Robes, RN Outcome: Progressing 09/30/2020 1027 by Arlina Robes, RN Outcome: Progressing 09/30/2020 1025 by Arlina Robes, RN Outcome: Progressing 09/30/2020 1025 by Arlina Robes, RN Outcome: Progressing 09/30/2020 1023 by Arlina Robes, RN Outcome: Progressing   Problem: Nutrition: Goal: Adequate nutrition will be maintained 09/30/2020 1027 by Arlina Robes, RN Outcome: Progressing 09/30/2020 1027 by Arlina Robes, RN Outcome: Progressing 09/30/2020 1025 by Arlina Robes, RN Outcome: Progressing 09/30/2020 1025 by Arlina Robes, RN Outcome: Progressing 09/30/2020 1023 by Arlina Robes, RN Outcome: Progressing   Problem: Coping: Goal: Level of anxiety will decrease 09/30/2020 1027 by Arlina Robes, RN Outcome: Progressing 09/30/2020 1027 by Arlina Robes, RN Outcome: Progressing 09/30/2020 1025 by Arlina Robes, RN Outcome: Progressing 09/30/2020 1025 by Arlina Robes, RN Outcome: Progressing 09/30/2020 1023 by Arlina Robes, RN Outcome: Progressing   Problem: Elimination: Goal: Will not experience complications related to bowel motility 09/30/2020 1027 by Arlina Robes, RN Outcome: Progressing 09/30/2020 1027 by Arlina Robes, RN Outcome: Progressing 09/30/2020 1025 by Arlina Robes, RN Outcome: Progressing 09/30/2020 1025 by  Arlina Robes, RN Outcome: Progressing 09/30/2020 1023 by Arlina Robes, RN Outcome: Progressing Goal: Will not experience complications related to urinary retention 09/30/2020 1027 by Arlina Robes, RN Outcome: Progressing 09/30/2020 1027 by Arlina Robes, RN Outcome: Progressing 09/30/2020 1025 by Arlina Robes, RN Outcome: Progressing 09/30/2020 1025 by Arlina Robes, RN Outcome: Progressing 09/30/2020 1023 by Arlina Robes, RN Outcome: Progressing   Problem: Pain Managment: Goal: General experience of comfort will improve 09/30/2020 1027 by Arlina Robes, RN Outcome: Progressing 09/30/2020 1027 by Arlina Robes, RN Outcome: Progressing 09/30/2020 1025 by Arlina Robes, RN Outcome: Progressing 09/30/2020 1025 by Arlina Robes, RN Outcome: Progressing 09/30/2020 1023 by Arlina Robes, RN Outcome: Progressing   Problem: Safety: Goal: Ability to remain free from injury will improve 09/30/2020 1027 by Arlina Robes, RN Outcome: Progressing 09/30/2020 1027 by Arlina Robes, RN Outcome: Progressing 09/30/2020 1025 by Arlina Robes, RN Outcome: Progressing 09/30/2020 1025 by Arlina Robes, RN Outcome: Progressing 09/30/2020 1023 by Arlina Robes, RN Outcome: Progressing   Problem: Skin Integrity: Goal: Risk for impaired skin integrity will decrease 09/30/2020 1027 by Arlina Robes, RN Outcome: Progressing 09/30/2020 1027 by  Arlina Robes, RN Outcome: Progressing 09/30/2020 1025 by Arlina Robes, RN Outcome: Progressing 09/30/2020 1025 by Arlina Robes, RN Outcome: Progressing 09/30/2020 1023 by Arlina Robes, RN Outcome: Progressing   Problem: Activity: Goal: Ability to tolerate increased activity will improve 09/30/2020 1027 by Arlina Robes, RN Outcome: Progressing 09/30/2020 1027 by Arlina Robes, RN Outcome: Progressing 09/30/2020 1025 by Arlina Robes, RN Outcome: Progressing   Problem: Clinical Measurements: Goal: Ability to maintain a body temperature in the normal range will improve 09/30/2020 1027 by Arlina Robes, RN Outcome: Progressing 09/30/2020 1027 by Arlina Robes, RN Outcome: Progressing 09/30/2020 1025 by Arlina Robes, RN Outcome: Progressing   Problem: Respiratory: Goal: Ability to maintain adequate ventilation will improve 09/30/2020 1027 by Arlina Robes, RN Outcome: Progressing 09/30/2020 1027 by Arlina Robes, RN Outcome: Progressing 09/30/2020 1025 by Arlina Robes, RN Outcome: Progressing Goal: Ability to maintain a clear airway will improve 09/30/2020 1027 by Arlina Robes, RN Outcome: Progressing 09/30/2020 1027 by Arlina Robes, RN Outcome: Progressing 09/30/2020 1025 by Arlina Robes, RN Outcome: Progressing   Problem: Education: Goal: Knowledge of disease or condition will improve 09/30/2020 1027 by Arlina Robes, RN Outcome: Progressing 09/30/2020 1027 by Arlina Robes, RN Outcome: Progressing Goal: Knowledge of the prescribed therapeutic regimen will improve 09/30/2020 1027 by Arlina Robes, RN Outcome: Progressing 09/30/2020 1027 by Arlina Robes, RN Outcome: Progressing Goal: Individualized Educational Video(s) 09/30/2020 1027 by Arlina Robes, RN Outcome: Progressing 09/30/2020 1027 by Arlina Robes, RN Outcome: Progressing    Problem: Activity: Goal: Ability to tolerate increased activity will improve 09/30/2020 1027 by Arlina Robes, RN Outcome: Progressing 09/30/2020 1027 by Arlina Robes, RN Outcome: Progressing Goal: Will verbalize the importance of balancing activity with adequate rest periods 09/30/2020 1027 by Arlina Robes, RN Outcome: Progressing 09/30/2020 1027 by Arlina Robes, RN Outcome: Progressing   Problem: Respiratory: Goal: Ability to maintain a clear airway will improve 09/30/2020 1027 by Arlina Robes, RN Outcome: Progressing 09/30/2020 1027 by Arlina Robes, RN Outcome: Progressing Goal: Levels of oxygenation will improve 09/30/2020 1027 by Arlina Robes, RN Outcome: Progressing 09/30/2020 1027 by Arlina Robes, RN Outcome: Progressing Goal: Ability to  maintain adequate ventilation will improve 09/30/2020 1027 by Arlina Robes, RN Outcome: Progressing 09/30/2020 1027 by Arlina Robes, RN Outcome: Progressing

## 2020-09-30 NOTE — ED Notes (Signed)
Report given to floor Cephus Shelling, RN

## 2020-09-30 NOTE — Plan of Care (Signed)
Problem: Education: Goal: Knowledge of General Education information will improve Description: Including pain rating scale, medication(s)/side effects and non-pharmacologic comfort measures 09/30/2020 1027 by Arlina Robes, RN Outcome: Progressing 09/30/2020 1025 by Arlina Robes, RN Outcome: Progressing 09/30/2020 1025 by Arlina Robes, RN Outcome: Progressing 09/30/2020 1023 by Arlina Robes, RN Outcome: Progressing   Problem: Health Behavior/Discharge Planning: Goal: Ability to manage health-related needs will improve 09/30/2020 1027 by Arlina Robes, RN Outcome: Progressing 09/30/2020 1025 by Arlina Robes, RN Outcome: Progressing 09/30/2020 1025 by Arlina Robes, RN Outcome: Progressing 09/30/2020 1023 by Arlina Robes, RN Outcome: Progressing   Problem: Clinical Measurements: Goal: Ability to maintain clinical measurements within normal limits will improve 09/30/2020 1027 by Arlina Robes, RN Outcome: Progressing 09/30/2020 1025 by Arlina Robes, RN Outcome: Progressing 09/30/2020 1025 by Arlina Robes, RN Outcome: Progressing 09/30/2020 1023 by Arlina Robes, RN Outcome: Progressing Goal: Will remain free from infection 09/30/2020 1027 by Arlina Robes, RN Outcome: Progressing 09/30/2020 1025 by Arlina Robes, RN Outcome: Progressing 09/30/2020 1025 by Arlina Robes, RN Outcome: Progressing 09/30/2020 1023 by Arlina Robes, RN Outcome: Progressing Goal: Diagnostic test results will improve 09/30/2020 1027 by Arlina Robes, RN Outcome: Progressing 09/30/2020 1025 by Arlina Robes, RN Outcome: Progressing 09/30/2020 1025 by Arlina Robes, RN Outcome: Progressing 09/30/2020 1023 by Arlina Robes, RN Outcome: Progressing Goal: Respiratory complications will improve 09/30/2020 1027 by Arlina Robes, RN Outcome: Progressing 09/30/2020 1025 by Arlina Robes, RN Outcome: Progressing 09/30/2020 1025 by Arlina Robes, RN Outcome: Progressing 09/30/2020 1023 by Arlina Robes, RN Outcome: Progressing Goal: Cardiovascular complication will be avoided 09/30/2020 1027 by Arlina Robes, RN Outcome: Progressing 09/30/2020 1025 by Arlina Robes, RN Outcome: Progressing 09/30/2020 1025 by Arlina Robes, RN Outcome: Progressing 09/30/2020 1023 by Arlina Robes, RN Outcome: Progressing   Problem: Activity: Goal: Risk for activity intolerance will decrease 09/30/2020 1027 by Arlina Robes, RN Outcome: Progressing 09/30/2020 1025 by Arlina Robes, RN Outcome: Progressing 09/30/2020 1025 by Arlina Robes, RN Outcome: Progressing 09/30/2020 1023 by Arlina Robes, RN Outcome: Progressing   Problem: Nutrition: Goal: Adequate nutrition will be maintained 09/30/2020 1027 by Arlina Robes, RN Outcome: Progressing 09/30/2020 1025 by Arlina Robes, RN Outcome: Progressing 09/30/2020 1025 by Arlina Robes, RN Outcome: Progressing 09/30/2020 1023 by Arlina Robes, RN Outcome: Progressing   Problem: Coping: Goal: Level of anxiety will decrease 09/30/2020 1027 by Arlina Robes, RN Outcome: Progressing 09/30/2020 1025 by Arlina Robes, RN Outcome: Progressing 09/30/2020 1025 by Arlina Robes, RN Outcome: Progressing 09/30/2020 1023 by Arlina Robes, RN Outcome: Progressing   Problem: Elimination: Goal: Will not experience complications related to bowel motility 09/30/2020 1027 by Arlina Robes, RN Outcome: Progressing 09/30/2020 1025 by Arlina Robes, RN Outcome: Progressing 09/30/2020 1025 by Arlina Robes, RN Outcome: Progressing 09/30/2020 1023 by Arlina Robes, RN Outcome: Progressing Goal: Will not experience complications related to urinary retention 09/30/2020 1027 by Arlina Robes, RN Outcome:  Progressing 09/30/2020 1025 by Arlina Robes, RN Outcome: Progressing 09/30/2020 1025 by Arlina Robes, RN Outcome: Progressing 09/30/2020 1023 by Arlina Robes, RN Outcome: Progressing   Problem: Pain Managment: Goal: General experience of comfort will improve 09/30/2020 1027 by Arlina Robes, RN Outcome: Progressing 09/30/2020 1025 by Arlina Robes, RN Outcome: Progressing 09/30/2020 1025 by Arlina Robes, RN Outcome: Progressing  09/30/2020 1023 by Arlina Robes, RN Outcome: Progressing   Problem: Safety: Goal: Ability to remain free from injury will improve 09/30/2020 1027 by Arlina Robes, RN Outcome: Progressing 09/30/2020 1025 by Arlina Robes, RN Outcome: Progressing 09/30/2020 1025 by Arlina Robes, RN Outcome: Progressing 09/30/2020 1023 by Arlina Robes, RN Outcome: Progressing   Problem: Skin Integrity: Goal: Risk for impaired skin integrity will decrease 09/30/2020 1027 by Arlina Robes, RN Outcome: Progressing 09/30/2020 1025 by Arlina Robes, RN Outcome: Progressing 09/30/2020 1025 by Arlina Robes, RN Outcome: Progressing 09/30/2020 1023 by Arlina Robes, RN Outcome: Progressing   Problem: Activity: Goal: Ability to tolerate increased activity will improve 09/30/2020 1027 by Arlina Robes, RN Outcome: Progressing 09/30/2020 1025 by Arlina Robes, RN Outcome: Progressing   Problem: Clinical Measurements: Goal: Ability to maintain a body temperature in the normal range will improve 09/30/2020 1027 by Arlina Robes, RN Outcome: Progressing 09/30/2020 1025 by Arlina Robes, RN Outcome: Progressing   Problem: Respiratory: Goal: Ability to maintain adequate ventilation will improve 09/30/2020 1027 by Arlina Robes, RN Outcome: Progressing 09/30/2020 1025 by Arlina Robes, RN Outcome: Progressing Goal: Ability to maintain a clear airway will  improve 09/30/2020 1027 by Arlina Robes, RN Outcome: Progressing 09/30/2020 1025 by Arlina Robes, RN Outcome: Progressing   Problem: Education: Goal: Knowledge of disease or condition will improve Outcome: Progressing Goal: Knowledge of the prescribed therapeutic regimen will improve Outcome: Progressing Goal: Individualized Educational Video(s) Outcome: Progressing   Problem: Activity: Goal: Ability to tolerate increased activity will improve Outcome: Progressing Goal: Will verbalize the importance of balancing activity with adequate rest periods Outcome: Progressing   Problem: Respiratory: Goal: Ability to maintain a clear airway will improve Outcome: Progressing Goal: Levels of oxygenation will improve Outcome: Progressing Goal: Ability to maintain adequate ventilation will improve Outcome: Progressing

## 2020-09-30 NOTE — ED Notes (Signed)
Pt daughter asked for a callback with an update. [name] [phone]  Sent information to nurse.

## 2020-09-30 NOTE — Plan of Care (Signed)

## 2020-09-30 NOTE — Plan of Care (Signed)
  Problem: Education: Goal: Knowledge of General Education information will improve Description: Including pain rating scale, medication(s)/side effects and non-pharmacologic comfort measures 09/30/2020 1025 by Arlina Robes, RN Outcome: Progressing 09/30/2020 1023 by Arlina Robes, RN Outcome: Progressing   Problem: Health Behavior/Discharge Planning: Goal: Ability to manage health-related needs will improve 09/30/2020 1025 by Arlina Robes, RN Outcome: Progressing 09/30/2020 1023 by Arlina Robes, RN Outcome: Progressing   Problem: Clinical Measurements: Goal: Ability to maintain clinical measurements within normal limits will improve 09/30/2020 1025 by Arlina Robes, RN Outcome: Progressing 09/30/2020 1023 by Arlina Robes, RN Outcome: Progressing Goal: Will remain free from infection 09/30/2020 1025 by Arlina Robes, RN Outcome: Progressing 09/30/2020 1023 by Arlina Robes, RN Outcome: Progressing Goal: Diagnostic test results will improve 09/30/2020 1025 by Arlina Robes, RN Outcome: Progressing 09/30/2020 1023 by Arlina Robes, RN Outcome: Progressing Goal: Respiratory complications will improve 09/30/2020 1025 by Arlina Robes, RN Outcome: Progressing 09/30/2020 1023 by Arlina Robes, RN Outcome: Progressing Goal: Cardiovascular complication will be avoided 09/30/2020 1025 by Arlina Robes, RN Outcome: Progressing 09/30/2020 1023 by Arlina Robes, RN Outcome: Progressing   Problem: Activity: Goal: Risk for activity intolerance will decrease 09/30/2020 1025 by Arlina Robes, RN Outcome: Progressing 09/30/2020 1023 by Arlina Robes, RN Outcome: Progressing   Problem: Nutrition: Goal: Adequate nutrition will be maintained 09/30/2020 1025 by Arlina Robes, RN Outcome: Progressing 09/30/2020 1023 by Arlina Robes, RN Outcome: Progressing   Problem: Coping: Goal: Level  of anxiety will decrease 09/30/2020 1025 by Arlina Robes, RN Outcome: Progressing 09/30/2020 1023 by Arlina Robes, RN Outcome: Progressing   Problem: Elimination: Goal: Will not experience complications related to bowel motility 09/30/2020 1025 by Arlina Robes, RN Outcome: Progressing 09/30/2020 1023 by Arlina Robes, RN Outcome: Progressing Goal: Will not experience complications related to urinary retention 09/30/2020 1025 by Arlina Robes, RN Outcome: Progressing 09/30/2020 1023 by Arlina Robes, RN Outcome: Progressing   Problem: Pain Managment: Goal: General experience of comfort will improve 09/30/2020 1025 by Arlina Robes, RN Outcome: Progressing 09/30/2020 1023 by Arlina Robes, RN Outcome: Progressing   Problem: Safety: Goal: Ability to remain free from injury will improve 09/30/2020 1025 by Arlina Robes, RN Outcome: Progressing 09/30/2020 1023 by Arlina Robes, RN Outcome: Progressing   Problem: Skin Integrity: Goal: Risk for impaired skin integrity will decrease 09/30/2020 1025 by Arlina Robes, RN Outcome: Progressing 09/30/2020 1023 by Arlina Robes, RN Outcome: Progressing

## 2020-10-01 ENCOUNTER — Inpatient Hospital Stay (HOSPITAL_COMMUNITY): Payer: Medicare Other

## 2020-10-01 ENCOUNTER — Telehealth: Payer: Self-pay | Admitting: Internal Medicine

## 2020-10-01 DIAGNOSIS — J441 Chronic obstructive pulmonary disease with (acute) exacerbation: Principal | ICD-10-CM

## 2020-10-01 DIAGNOSIS — J9601 Acute respiratory failure with hypoxia: Secondary | ICD-10-CM

## 2020-10-01 DIAGNOSIS — J189 Pneumonia, unspecified organism: Secondary | ICD-10-CM

## 2020-10-01 DIAGNOSIS — R918 Other nonspecific abnormal finding of lung field: Secondary | ICD-10-CM

## 2020-10-01 MED ORDER — ADULT MULTIVITAMIN W/MINERALS CH
1.0000 | ORAL_TABLET | Freq: Every day | ORAL | Status: DC
  Administered 2020-10-01 – 2020-10-02 (×2): 1 via ORAL
  Filled 2020-10-01: qty 1

## 2020-10-01 MED ORDER — CEFDINIR 300 MG PO CAPS
300.0000 mg | ORAL_CAPSULE | Freq: Two times a day (BID) | ORAL | Status: DC
  Administered 2020-10-01 – 2020-10-02 (×3): 300 mg via ORAL
  Filled 2020-10-01 (×3): qty 1

## 2020-10-01 MED ORDER — ENSURE ENLIVE PO LIQD
237.0000 mL | Freq: Three times a day (TID) | ORAL | Status: DC
  Administered 2020-10-02: 237 mL via ORAL

## 2020-10-01 NOTE — Progress Notes (Addendum)
[facility] PROGRESS NOTE   [name] PQZ:[id number] DOB: 07-22-1946 DOA: 09/29/2020  PCP: Donella Stade, PA-C  Brief History/Interval Summary:  74 y.o. female with medical history significant of NSCLC in L lung s/p lobectomy in 2013, COPD on 3L home O2 and BIPAP QHS, HTN.  Patient presented to the emergency department with complaints of shortness of breath ongoing for about 2 days.  Worse than her baseline.  Noted to be hypoxic as well.  Noted to have elevated WBC.  CT scan raise concern for nodular opacities in the lung.  Reason for Visit: Acute on chronic respiratory failure with hypoxia  Consultants: Pulmonology  Procedures: None  Antibiotics: Anti-infectives (From admission, onward)   Start     Dose/Rate Route Frequency Ordered Stop   09/30/20 2200  azithromycin (ZITHROMAX) tablet 250 mg       "Followed by" Linked Group Details   250 mg Oral Daily at bedtime 09/29/20 2220 10/04/20 2159   09/29/20 2300  cefTRIAXone (ROCEPHIN) 2 g in sodium chloride 0.9 % 100 mL IVPB        2 g 200 mL/hr over 30 Minutes Intravenous Every 24 hours 09/29/20 2220 10/04/20 2259   09/29/20 2300  azithromycin (ZITHROMAX) tablet 500 mg       "Followed by" Linked Group Details   500 mg Oral NOW 09/29/20 2220 09/30/20 0023      Subjective/Interval History: Patient remains a poor historian.  However she does mention that she is feeling better today.  Feels stronger.  Denies any chest pain.  States that she has ambulated to the bathroom.  Did not feel any more short of breath compared to when she does that at home.  No nausea vomiting.     Assessment/Plan:  COPD with acute exacerbation/acute on chronic respiratory failure with hypoxia Patient placed on steroids nebulizer treatments.  Inhalers being continued.   Seems to be stable from a respiratory standpoint.  She is on oxygen at home as well.stable at 2 to 4 L/min by nasal cannula.  Apparently uses trilogy ventilator at night  times.  Non-small cell lung cancer status post lobectomy in 2013 CT angiogram raise concern for interval increase in size of right upper lobe nodule as well as interval development of nodular-like peribronchovascular subsolid opacities within the left lower lobe and right upper lobe.   Patient previously seen by Dr. Lamonte Sakai.  Will consult pulmonology today.  Concern for community-acquired pneumonia Findings noted on the CT scan could either be due to recurrence of her cancer or infection.  Patient was placed on antibacterials. WBC was 15.1 at admission.  Improved to 8.2 as of yesterday.  Remains afebrile.  Change to oral antibiotics today.  Mildly elevated troponin Patient denies any chest pain.  EKG did not show any acute ischemic changes.  Likely mild demand ischemia.  Echocardiogram showed normal systolic function.  Grade 2 diastolic dysfunction noted.  No regional wall motion abnormalities.  No significant valvular abnormalities.  Elevated pulmonary pressures were noted likely due to her underlying COPD.  Do not anticipate any further work-up in the hospital.  Essential hypertension Continue current medications.  Noted to be on clonidine, Avapro, labetalol.  Occasional high readings noted.  Continue to monitor.    Normocytic anemia No evidence of overt bleeding.  Monitor hemoglobin closely.  Previous history of pulmonary embolism This was back in 2019.  She has completed course of anticoagulation.   DVT Prophylaxis: On Lovenox Code Status: Full code Family Communication: Discussed  with her granddaughter yesterday.  We will update her today again. Disposition Plan: Home health recommended by PT.    Status is: Inpatient  Remains inpatient appropriate because:IV treatments appropriate due to intensity of illness or inability to take PO and Inpatient level of care appropriate due to severity of illness   Dispo:  Patient From: Home  Planned Disposition: Home  Expected discharge date:  10/02/2020  Medically stable for discharge: No      Medications:  Scheduled: Marland Kitchen AeroChamber Plus Flo-Vu Large  1 each Other Once  . AeroChamber Plus Flo-Vu Medium  1 each Other Once  . aspirin EC  81 mg Oral Daily  . atorvastatin  20 mg Oral QHS  . azithromycin  250 mg Oral QHS  . celecoxib  200 mg Oral Daily  . cloNIDine  0.3 mg Transdermal Weekly  . enoxaparin (LOVENOX) injection  40 mg Subcutaneous QHS  . irbesartan  300 mg Oral QHS  . labetalol  400 mg Oral BID  . levothyroxine  50 mcg Oral Q0600  . mometasone-formoterol  2 puff Inhalation BID  . predniSONE  40 mg Oral Q breakfast  . QUEtiapine  100 mg Oral QHS  . umeclidinium bromide  1 puff Inhalation Daily  . vortioxetine HBr  20 mg Oral Daily   Continuous: . cefTRIAXone (ROCEPHIN)  IV Stopped (09/30/20 2250)   PXT:GGYIRSWNIO, ipratropium-albuterol, levalbuterol   Objective:  Vital Signs  Vitals:   10/01/20 0021 10/01/20 0629 10/01/20 0630 10/01/20 0954  BP:  (!) 170/49    Pulse:  68 66   Resp:   16   Temp:   97.9 F (36.6 C)   TempSrc:   Oral   SpO2:   100% 99%  Weight: 43.6 kg     Height:        Intake/Output Summary (Last 24 hours) at 10/01/2020 1010 Last data filed at 10/01/2020 0742 Gross per 24 hour  Intake 720 ml  Output 1801 ml  Net -1081 ml   Filed Weights   09/30/20 0311 10/01/20 0021  Weight: 44.3 kg 43.6 kg    General appearance: Awake alert.  In no distress Resp: Diminished air entry at the bases.  Few crackles.  No wheezing or rhonchi appreciated. Cardio: S1-S2 is normal regular.  No S3-S4.  No rubs murmurs or bruit GI: Abdomen is soft.  Nontender nondistended.  Bowel sounds are present normal.  No masses organomegaly Extremities: No edema.  Able To move all her extremities Neurologic: No focal neurological deficits.     Lab Results:  Data Reviewed: I have personally reviewed following labs and imaging studies  CBC: Recent Labs  Lab 09/29/20 1407 09/29/20 1658  09/30/20 0407  WBC 15.1*  --  8.2  HGB 9.4* 10.5* 9.1*  HCT 31.6* 31.0* 28.3*  MCV 97.5  --  94.3  PLT 362  --  270    Basic Metabolic Panel: Recent Labs  Lab 09/29/20 1407 09/29/20 1658 09/30/20 0407  NA 137 134* 138  K 4.3 4.7 5.0  CL 89*  --  87*  CO2 38*  --  40*  GLUCOSE 166*  --  147*  BUN 15  --  22  CREATININE 0.57  --  0.61  CALCIUM 9.3  --  9.4    GFR: Estimated Creatinine Clearance: 43.1 mL/min (by C-G formula based on SCr of 0.61 mg/dL).   Recent Results (from the past 240 hour(s))  Resp Panel by RT-PCR (Flu A&B, Covid) Nasopharyngeal Swab  Status: None   Collection Time: 09/29/20  4:21 PM   Specimen: Nasopharyngeal Swab; Nasopharyngeal(NP) swabs in vial transport medium  Result Value Ref Range Status   SARS Coronavirus 2 by RT PCR NEGATIVE NEGATIVE Final    Comment: (NOTE) SARS-CoV-2 target nucleic acids are NOT DETECTED.  The SARS-CoV-2 RNA is generally detectable in upper respiratory specimens during the acute phase of infection. The lowest concentration of SARS-CoV-2 viral copies this assay can detect is 138 copies/mL. A negative result does not preclude SARS-Cov-2 infection and should not be used as the sole basis for treatment or other patient management decisions. A negative result may occur with  improper specimen collection/handling, submission of specimen other than nasopharyngeal swab, presence of viral mutation(s) within the areas targeted by this assay, and inadequate number of viral copies(<138 copies/mL). A negative result must be combined with clinical observations, patient history, and epidemiological information. The expected result is Negative.  Fact Sheet for Patients:  EntrepreneurPulse.com.au  Fact Sheet for Healthcare Providers:  IncredibleEmployment.be  This test is no t yet approved or cleared by the [place] FDA and  has been authorized for detection and/or diagnosis of  SARS-CoV-2 by FDA under an Emergency Use Authorization (EUA). This EUA will remain  in effect (meaning this test can be used) for the duration of the COVID-19 declaration under Section 564(b)(1) of the Act, 21 U.S.C.section 360bbb-3(b)(1), unless the authorization is terminated  or revoked sooner.       Influenza A by PCR NEGATIVE NEGATIVE Final   Influenza B by PCR NEGATIVE NEGATIVE Final    Comment: (NOTE) The Xpert Xpress SARS-CoV-2/FLU/RSV plus assay is intended as an aid in the diagnosis of influenza from Nasopharyngeal swab specimens and should not be used as a sole basis for treatment. Nasal washings and aspirates are unacceptable for Xpert Xpress SARS-CoV-2/FLU/RSV testing.  Fact Sheet for Patients: EntrepreneurPulse.com.au  Fact Sheet for Healthcare Providers: IncredibleEmployment.be  This test is not yet approved or cleared by the [place] FDA and has been authorized for detection and/or diagnosis of SARS-CoV-2 by FDA under an Emergency Use Authorization (EUA). This EUA will remain in effect (meaning this test can be used) for the duration of the COVID-19 declaration under Section 564(b)(1) of the Act, 21 U.S.C. section 360bbb-3(b)(1), unless the authorization is terminated or revoked.  Performed at [facility], [place] [address] [place], [place] [address]       Radiology Studies: DG Chest 2 View  Result Date: 09/29/2020 CLINICAL DATA:  Shortness of breath EXAM: CHEST - 2 VIEW COMPARISON:  01/26/2018 FINDINGS: Redemonstrated postoperative findings of left upper lobectomy with volume loss of the left hemithorax and leftward shift of the mediastinum. Hyperinflation and emphysema of the right lung. The visualized skeletal structures are unremarkable. IMPRESSION: Redemonstrated postoperative findings of left upper lobectomy. Hyperinflation and emphysema of the right lung. No acute abnormality of the lungs. Electronically  Signed   By: Eddie Candle M.D.   On: 09/29/2020 15:08   CT Angio Chest PE W and/or Wo Contrast  Result Date: 09/29/2020 CLINICAL DATA:  Shortness of breath. COPD. History of lung cancer status post left upper lobectomy. 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 EXAM: CT ANGIOGRAPHY CHEST WITH CONTRAST TECHNIQUE: Multidetector CT imaging of the chest was performed using the standard protocol during bolus administration of intravenous contrast. Multiplanar CT image reconstructions and MIPs were obtained to evaluate the vascular anatomy. CONTRAST:  98mL OMNIPAQUE IOHEXOL 350 MG/ML SOLN COMPARISON:  PET  CT 4/27/7, CT angio chest 01/27/2018. FINDINGS: Cardiovascular: Satisfactory opacification of the pulmonary arteries to the segmental level. No evidence of pulmonary embolism. The main pulmonary artery is enlarged measuring up to 3.1 cm. Normal heart size. No significant pericardial effusion. The thoracic aorta is normal in caliber. Aortic valve leaflet calcifications-mild. Moderate atherosclerotic plaque of the thoracic aorta. At least mild 2 vessel coronary artery calcifications. Mediastinum/Nodes: Enlarged prevascular lymph node measuring up to 1 cm (5:50). Enlarged 1 cm right hilar lymph node (5: 66). No definite no axillary lymph nodes. Thyroid gland, trachea, and esophagus demonstrate no significant findings. Lungs/Pleura: Status post left upper lobectomy. Severe centrilobular emphysematous changes. Bilateral lower lobe calcified nodules likely representing granulomas. Interval development of left lower lobe peribronchovascular nodular-like subsolid densities with as an example a 1.2 cm lesion (6:76) and a 1 cm lesion of lesion (6: 94). Interval increase in size of a right upper lobe subsolid nodular-like density measuring 1.1 cm (6:40). Interval development of several of the right upper lobe areas of subsolid opacities more inferiorly (as an example: 6:36). A 5 mm right  lower lobe ground-glass pulmonary nodule (6:97). No pleural effusion. No pneumothorax. Upper Abdomen: No acute abnormality. Musculoskeletal: No abdominal wall hernia or abnormality No suspicious lytic or blastic osseous lesions. Similar-appearing cortical irregularity of the left upper ribs. No acute displaced fracture. Multilevel degenerative changes of the spine. Review of the MIP images confirms the above findings. IMPRESSION: 1. Interval increase in size of a right upper lobe 1.1 cm subsolid nodular-like opacity as well as interval development of nodular-like peribronchovascular subsolid opacities within the left lower lobe and right upper lobe. Findings suspicious for malignancy (such as adenocarcinoma) versus infection/inflammation. Additional imaging evaluation or consultation with Pulmonology or Thoracic Surgery recommended. 2. Borderline enlarged prevascular and right hilar lymph nodes likely reactive in etiology. 3. Borderline enlarged main pulmonary artery. Correlate with pulmonary hypertension. 4. Aortic Atherosclerosis (ICD10-I70.0) and Emphysema (ICD10-J43.9). Electronically Signed   By: Iven Finn M.D.   On: 09/29/2020 21:02   ECHOCARDIOGRAM COMPLETE  Result Date: 09/30/2020    ECHOCARDIOGRAM REPORT   Patient Name:   [name] [facility] Date of Exam: 09/30/2020 Medical Rec #:  [id number]      Height:       64.0 in Accession #:    [id number]     Weight:       97.7 lb Date of Birth:  [birth year]      BSA:          1.443 m Patient Age:    47 years       BP:           147/56 mmHg Patient Gender: F              HR:           74 bpm. Exam Location:  Inpatient Procedure: 2D Echo, Cardiac Doppler and Color Doppler Indications:    Elevated Troponin.  History:        Patient has prior history of Echocardiogram examinations, most                 recent 02/05/2018. CAD, COPD; Risk Factors:Hypertension. Cancer.  Sonographer:    Jonelle Sidle Dance Referring Phys: [place]  1. Left ventricular  ejection fraction, by estimation, is 55 to 60%. The left ventricle has normal function. The left ventricle has no regional wall motion abnormalities. Left ventricular diastolic parameters are consistent with Grade II diastolic dysfunction (pseudonormalization). Elevated left ventricular  end-diastolic pressure.  2. Right ventricular systolic function is normal. The right ventricular size is normal. There is mildly elevated pulmonary artery systolic pressure.  3. Left atrial size was mildly dilated.  4. The mitral valve is normal in structure. Mild mitral valve regurgitation. No evidence of mitral stenosis.  5. Nodular calcification of non coronary cusp. The aortic valve is tricuspid. Aortic valve regurgitation is not visualized. Mild to moderate aortic valve sclerosis/calcification is present, without any evidence of aortic stenosis.  6. The inferior vena cava is normal in size with greater than 50% respiratory variability, suggesting right atrial pressure of 3 mmHg. FINDINGS  Left Ventricle: Left ventricular ejection fraction, by estimation, is 55 to 60%. The left ventricle has normal function. The left ventricle has no regional wall motion abnormalities. The left ventricular internal cavity size was normal in size. There is  no left ventricular hypertrophy. Left ventricular diastolic parameters are consistent with Grade II diastolic dysfunction (pseudonormalization). Elevated left ventricular end-diastolic pressure. Right Ventricle: The right ventricular size is normal. No increase in right ventricular wall thickness. Right ventricular systolic function is normal. There is mildly elevated pulmonary artery systolic pressure. The tricuspid regurgitant velocity is 2.70  m/s, and with an assumed right atrial pressure of 8 mmHg, the estimated right ventricular systolic pressure is 93.2 mmHg. Left Atrium: Left atrial size was mildly dilated. Right Atrium: Right atrial size was normal in size. Pericardium: There is no  evidence of pericardial effusion. Mitral Valve: The mitral valve is normal in structure. Mild mitral valve regurgitation. No evidence of mitral valve stenosis. Tricuspid Valve: The tricuspid valve is normal in structure. Tricuspid valve regurgitation is mild . No evidence of tricuspid stenosis. Aortic Valve: Nodular calcification of non coronary cusp. The aortic valve is tricuspid. Aortic valve regurgitation is not visualized. Mild to moderate aortic valve sclerosis/calcification is present, without any evidence of aortic stenosis. Pulmonic Valve: The pulmonic valve was normal in structure. Pulmonic valve regurgitation is not visualized. No evidence of pulmonic stenosis. Aorta: The aortic root is normal in size and structure. Venous: The inferior vena cava is normal in size with greater than 50% respiratory variability, suggesting right atrial pressure of 3 mmHg. IAS/Shunts: No atrial level shunt detected by color flow Doppler.  LEFT VENTRICLE PLAX 2D LVIDd:         4.40 cm  Diastology LVIDs:         3.00 cm  LV e' medial:    6.20 cm/s LV PW:         1.00 cm  LV E/e' medial:  18.4 LV IVS:        1.00 cm  LV e' lateral:   6.09 cm/s LVOT diam:     1.80 cm  LV E/e' lateral: 18.7 LV SV:         64 LV SV Index:   45 LVOT Area:     2.54 cm  RIGHT VENTRICLE             IVC RV Basal diam:  2.40 cm     IVC diam: 2.40 cm RV S prime:     12.10 cm/s TAPSE (M-mode): 2.0 cm LEFT ATRIUM             Index       RIGHT ATRIUM           Index LA diam:        4.10 cm 2.84 cm/m  RA Area:     10.50 cm LA Vol (A2C):  78.8 ml 54.62 ml/m RA Volume:   22.20 ml  15.39 ml/m LA Vol (A4C):   76.8 ml 53.24 ml/m LA Biplane Vol: 80.7 ml 55.94 ml/m  AORTIC VALVE LVOT Vmax:   97.90 cm/s LVOT Vmean:  66.200 cm/s LVOT VTI:    0.253 m  AORTA Ao Root diam: 2.80 cm Ao Asc diam:  2.90 cm MITRAL VALVE                TRICUSPID VALVE MV Area (PHT): 3.65 cm     TR Peak grad:   29.2 mmHg MV Decel Time: 208 msec     TR Vmax:        270.00 cm/s MV E  velocity: 114.00 cm/s MV A velocity: 108.00 cm/s  SHUNTS MV E/A ratio:  1.06         Systemic VTI:  0.25 m                             Systemic Diam: 1.80 cm Jenkins Rouge MD Electronically signed by Jenkins Rouge MD Signature Date/Time: 09/30/2020/10:09:04 AM    Final        LOS: 1 day   [facility] Pager on [email or web address]  10/01/2020, 10:10 AM

## 2020-10-01 NOTE — Progress Notes (Signed)
Sitting outside pts room and pt took BIPAP mask off and pulled pure wick out and was trying to get out of bed. RN was notified and pt was placed back on [place] for the night.

## 2020-10-01 NOTE — Progress Notes (Signed)
Initial Nutrition Assessment  DOCUMENTATION CODES:   Underweight,Severe malnutrition in context of chronic illness  INTERVENTION:   -Ensure Enlive po TID, each supplement provides 350 kcal and 20 grams of protein -MVI with minerals daily  NUTRITION DIAGNOSIS:   Severe Malnutrition related to chronic illness (COPD) as evidenced by percent weight loss,severe fat depletion,severe muscle depletion.  GOAL:   Patient will meet greater than or equal to 90% of their needs  MONITOR:   PO intake,Supplement acceptance,Labs,Weight trends,Skin,I & O's  REASON FOR ASSESSMENT:   Consult Assessment of nutrition requirement/status  ASSESSMENT:   [name] is a 74 y.o. female with medical history significant of NSCLC in L lung s/p lobectomy in 2013, COPD on 3L home O2 and BIPAP QHS, HTN.  Pt admitted with COPD exacerbation.  Reviewed I/O's: -241 ml x 24 hours and +150 ml since admission  UOP: 1.2 L x 24 hours  Spoke with pt at bedside, who reports poor appetite. She consumed scrambled eggs and orange juice this morning. Noted meal completion 25-75%. Pt endorses poor appetite for approximately 6 months. She consumes 2-3 meals per day, which consists of foods such as spaghetti, ham and cheese subs, and meat, starch, and vegetable.   Reviewed wt hx; pt has experienced a 11.7% wt loss over the past 6 months, which is significant for time frame. Pt shares her UBW is around 142#, which she last weighed about 6 months ago. Pt endorses weight loss related to poor appetite and grief from her husband passing away.   Discussed importance of good meal and supplement intake to promote healing. Pt amenable to Ensure Enlive supplements.   Labs reviewed.   NUTRITION - FOCUSED PHYSICAL EXAM:  Flowsheet Row Most Recent Value  Orbital Region Severe depletion  Upper Arm Region Severe depletion  Thoracic and Lumbar Region Severe depletion  Buccal Region Severe depletion  Temple Region Severe  depletion  Clavicle Bone Region Severe depletion  Clavicle and Acromion Bone Region Severe depletion  Scapular Bone Region Severe depletion  Dorsal Hand Severe depletion  Patellar Region Severe depletion  Anterior Thigh Region Severe depletion  Posterior Calf Region Severe depletion  Edema (RD Assessment) None  Hair Reviewed  Eyes Reviewed  Mouth Reviewed  Skin Reviewed  Nails Reviewed       Diet Order:   Diet Order            Diet Heart Room service appropriate? Yes; Fluid consistency: Thin  Diet effective now                 EDUCATION NEEDS:   Education needs have been addressed  Skin:  Skin Assessment: Reviewed RN Assessment  Last BM:  10/01/20  Height:   Ht Readings from Last 1 Encounters:  09/30/20 5\' 4"  (1.626 m)    Weight:   Wt Readings from Last 1 Encounters:  10/01/20 43.6 kg    Ideal Body Weight:  54.5 kg  BMI:  Body mass index is 16.51 kg/m.  Estimated Nutritional Needs:   Kcal:  0388-8280  Protein:  90-105 grams  Fluid:  > 1.7 L    Loistine Chance, RD, LDN, [place] Registered Dietitian II Certified Diabetes Care and Education Specialist Please refer to [facility] for RD and/or RD on-call/weekend/after hours pager

## 2020-10-01 NOTE — Telephone Encounter (Signed)
Outpatient order for superD CT 1 month  Erskine Emery

## 2020-10-01 NOTE — Evaluation (Signed)
Occupational Therapy Evaluation Patient Details Name: [name] MRN: [id number] DOB: 13-May-1946 Today's Date: 10/01/2020    History of Present Illness Pt adm with copd exacerbation. PMH - lung CA, copd, htn.   Clinical Impression   PTA, pt was living her granddaughter and was independent with ADLs and light IADLs; on 4L home O2. Pt currently requiring Supervision-Min Guard A for safety. Pt performing functional mobility to/from sink and completing grooming task. While standing at sink, pt reporting dizziness. Pt sitting at sink and taking BP; stable. Pt would benefit from further acute OT to facilitate safe dc. Pt presenting with decreased balance and activity tolerance. SpO2 >97% on 4L. Recommend dc to home with HHOT for further OT to optimize safety, independence with ADLs, and return to PLOF.    Orthostatic BPs   Supine 157/55  Sitting in chair 136/58  Sitting in recliner with BLE elevated 140/48      Follow Up Recommendations  Home health OT;Supervision/Assistance - 24 hour    Equipment Recommendations  None recommended by OT    Recommendations for Other Services PT consult     Precautions / Restrictions Precautions Precautions: Fall      Mobility Bed Mobility Overal bed mobility: Modified Independent                  Transfers Overall transfer level: Needs assistance Equipment used: None Transfers: Sit to/from Stand Sit to Stand: Min guard         General transfer comment: MIn Guard A for safety    Balance Overall balance assessment: Needs assistance Sitting-balance support: No upper extremity supported Sitting balance-Leahy Scale: Good     Standing balance support: No upper extremity supported;During functional activity Standing balance-Leahy Scale: Fair                             ADL either performed or assessed with clinical judgement   ADL Overall ADL's : Needs assistance/impaired Eating/Feeding: Set up;Sitting    Grooming: Oral care;Set up;Supervision/safety;Sitting Grooming Details (indicate cue type and reason): Pt initially standing to perform grooming tasks. Upper Body Bathing: Set up;Sitting;Supervision/ safety   Lower Body Bathing: Min guard;Sit to/from stand   Upper Body Dressing : Supervision/safety;Set up;Sitting   Lower Body Dressing: Min guard;Sit to/from stand   Toilet Transfer: Min guard;Ambulation (simulated to recliner)           Functional mobility during ADLs: Min guard General ADL Comments: Pt presenting with decreased activity tolerance and balance. During activity, pt reporting dizziness. BP stable     Vision         Perception     Praxis      Pertinent Vitals/Pain Pain Assessment: No/denies pain     Hand Dominance Right   Extremity/Trunk Assessment Upper Extremity Assessment Upper Extremity Assessment: Generalized weakness   Lower Extremity Assessment Lower Extremity Assessment: Defer to PT evaluation   Cervical / Trunk Assessment Cervical / Trunk Assessment: Normal   Communication Communication Communication: No difficulties   Cognition Arousal/Alertness: Awake/alert Behavior During Therapy: WFL for tasks assessed/performed Overall Cognitive Status: No family/caregiver present to determine baseline cognitive functioning Area of Impairment: Memory;Problem solving                     Memory: Decreased short-term memory       Problem Solving: Requires verbal cues;Requires tactile cues;Slow processing;Difficulty sequencing General Comments: Feel pt is close to baseline cognition. At times repeating  herself and forgetting information. Requiring cues for problem solving.   General Comments  Upon arrival, pt on 7L O2 and SpO2 100%. Throughout session, SpO2 >97% on 4L O2. BP supine 157/55 (86), sitting at sink 136/58 (81), and sitting at recliner wiht elevated BLEs 120/48 (75)    Exercises     Shoulder Instructions      Home Living  Family/patient expects to be discharged to:: Private residence Living Arrangements: Children Available Help at Discharge: Family Type of Home: House Home Access: Level entry     Home Layout: Multi-level Alternate Level Stairs-Number of Steps: 2 Alternate Level Stairs-Rails: Right Bathroom Shower/Tub: Teacher, early years/pre: Standard     Home Equipment: Tub bench;Walker - 4 wheels;Bedside commode   Additional Comments: Wears 4L home O2 during the day      Prior Functioning/Environment Level of Independence: Needs assistance  Gait / Transfers Assistance Needed: modified independent. Uses rollator if out of home ADL's / Homemaking Assistance Needed: Does ADLs. Granddaughter does IADLs. Has small dog            OT Problem List: Decreased strength;Decreased range of motion;Decreased activity tolerance;Impaired balance (sitting and/or standing);Decreased knowledge of use of DME or AE;Decreased knowledge of precautions;Decreased safety awareness      OT Treatment/Interventions: Self-care/ADL training;Therapeutic exercise;DME and/or AE instruction;Energy conservation;Therapeutic activities;Patient/family education    OT Goals(Current goals can be found in the care plan section) Acute Rehab OT Goals Patient Stated Goal: go home OT Goal Formulation: With patient Time For Goal Achievement: 10/15/20 Potential to Achieve Goals: Good  OT Frequency: Min 2X/week   Barriers to D/C:            Co-evaluation              AM-PAC OT "6 Clicks" Daily Activity     Outcome Measure Help from another person eating meals?: A Little Help from another person taking care of personal grooming?: A Little Help from another person toileting, which includes using toliet, bedpan, or urinal?: A Little Help from another person bathing (including washing, rinsing, drying)?: A Little Help from another person to put on and taking off regular upper body clothing?: A Little Help from  another person to put on and taking off regular lower body clothing?: A Little 6 Click Score: 18   End of Session Equipment Utilized During Treatment: Oxygen (4L) Nurse Communication: Mobility status  Activity Tolerance: Patient tolerated treatment well Patient left: in chair;with call bell/phone within reach;with chair alarm set  OT Visit Diagnosis: Unsteadiness on feet (R26.81);Muscle weakness (generalized) (M62.81);Other abnormalities of gait and mobility (R26.89)                Time: 3329-5188 OT Time Calculation (min): 32 min Charges:  OT General Charges $OT Visit: 1 Visit OT Evaluation $OT Eval Low Complexity: 1 Low OT Treatments $Self Care/Home Management : 8-22 mins  [place], OTR/L Acute Rehab Pager: [phone] Office: [place] 10/01/2020, 9:58 AM

## 2020-10-01 NOTE — Progress Notes (Signed)
Patient refusing BiPAP for the night.  RT will continue to monitor.

## 2020-10-01 NOTE — Consult Note (Signed)
NAME:  [name], MRN:  [id number], DOB:  1946-08-10, LOS: 1 ADMISSION DATE:  09/29/2020, CONSULTATION DATE:  10/01/20 REFERRING MD:  [place] Pink, CHIEF COMPLAINT:  SOB   Brief History   74 year old with hx of COPD, distant hx of lung cancer presenting with dyspnea and abnormal imaging.  History of present illness   74 year old year old woman with advanced COPD (FEV1 0.6L 2014) on HOT and qHS BIPAP, distant hx of lung cancer 2013 s/p lobectomy presenting with 3 days worsening dyspnea.  Associated with chest tightness, subjective wheezing.  Denies productive cough, fevers, chills.  Using home inhalers as prescribed. PCCM consulted as CTA performed showing interval increase in size of LUL nodule and scattered new peribronchovascular nodules.  Patient admits to near aspiration-type episodes controlled with drinking lots of water.  Past Medical History  Advanced COPD on HOT/BIPAP  Significant Hospital Events   12/13 admitted  Consults:  n/a  Procedures:  n/a  Significant Diagnostic Tests:  10/01/20 IMPRESSION: 1. Interval increase in size of a right upper lobe 1.1 cm subsolid nodular-like opacity as well as interval development of nodular-like peribronchovascular subsolid opacities within the left lower lobe and right upper lobe. Findings suspicious for malignancy (such as adenocarcinoma) versus infection/inflammation. Additional imaging evaluation or consultation with Pulmonology or Thoracic Surgery recommended. 2. Borderline enlarged prevascular and right hilar lymph nodes likely reactive in etiology. 3. Borderline enlarged main pulmonary artery. Correlate with pulmonary hypertension. 4. Aortic Atherosclerosis (ICD10-I70.0) and Emphysema (ICD10-J43.9).   Micro Data:  n/a  Antimicrobials:  Cefdinir 12/13>>   Interim history/subjective:  Consulted  Objective   Blood pressure (!) 164/71, pulse 63, temperature 97.7 F (36.5 C), temperature source Oral, resp.  rate 18, height 5\' 4"  (1.626 m), weight 43.6 kg, SpO2 96 %.        Intake/Output Summary (Last 24 hours) at 10/01/2020 1247 Last data filed at 10/01/2020 1236 Gross per 24 hour  Intake 960 ml  Output 2001 ml  Net -1041 ml   Filed Weights   09/30/20 0311 10/01/20 0021  Weight: 44.3 kg 43.6 kg    Examination: Constitutional: cachetic thin woman in NAD  Eyes: pupils equal, EOMI Ears, nose, mouth, and throat: MMM, trachea midline, temporal wasting Cardiovascular: RRR, ext warm Respiratory: Severe diminished with prolonged expiratory phase, no accessory muscle use Gastrointestinal: soft, +BS Skin: No rashes, normal turgor Neurologic: moves all 4 ext Psychiatric: RASS 0  Kidney function okay WBC 15>>8  Resolved Hospital Problem list   n/a  Assessment & Plan:  AECOPD, (hopefully) CAP vs. Aspiration pneumonitis Abnormal imaging- needs close OP f/u Advanced COPD in flare Pulmonary cachexia Question aspiration - Agree with course of cefdinir and prednisone - SLP consult - CT Chest super D protocol in 1 month and re-establish care with Dr. Lamonte Sakai to discuss candidacy for bronchoscopy if persistent nodules   Labs   CBC: Recent Labs  Lab 09/29/20 1407 09/29/20 1658 09/30/20 0407  WBC 15.1*  --  8.2  HGB 9.4* 10.5* 9.1*  HCT 31.6* 31.0* 28.3*  MCV 97.5  --  94.3  PLT 362  --  825    Basic Metabolic Panel: Recent Labs  Lab 09/29/20 1407 09/29/20 1658 09/30/20 0407  NA 137 134* 138  K 4.3 4.7 5.0  CL 89*  --  87*  CO2 38*  --  40*  GLUCOSE 166*  --  147*  BUN 15  --  22  CREATININE 0.57  --  0.61  CALCIUM  9.3  --  9.4   GFR: Estimated Creatinine Clearance: 43.1 mL/min (by C-G formula based on SCr of 0.61 mg/dL). Recent Labs  Lab 09/29/20 1407 09/30/20 0407  WBC 15.1* 8.2    Liver Function Tests: No results for input(s): AST, ALT, ALKPHOS, BILITOT, PROT, ALBUMIN in the last 168 hours. No results for input(s): LIPASE, AMYLASE in the last 168 hours. No  results for input(s): AMMONIA in the last 168 hours.  ABG    Component Value Date/Time   HCO3 42.0 (H) 09/29/2020 1658   TCO2 44 (H) 09/29/2020 1658   O2SAT 67.0 09/29/2020 1658     Coagulation Profile: No results for input(s): INR, PROTIME in the last 168 hours.  Cardiac Enzymes: No results for input(s): CKTOTAL, CKMB, CKMBINDEX, TROPONINI in the last 168 hours.  HbA1C: Hgb A1c MFr Bld  Date/Time Value Ref Range Status  08/28/2020 12:00 AM 5.4 <5.7 % of total Hgb Final    Comment:    For the purpose of screening for the presence of diabetes: . <5.7%       Consistent with the absence of diabetes 5.7-6.4%    Consistent with increased risk for diabetes             (prediabetes) > or =6.5%  Consistent with diabetes . This assay result is consistent with a decreased risk of diabetes. . Currently, no consensus exists regarding use of hemoglobin A1c for diagnosis of diabetes in children. . According to American Diabetes Association (ADA) guidelines, hemoglobin A1c <7.0% represents optimal control in non-pregnant diabetic patients. Different metrics may apply to specific patient populations.  Standards of Medical Care in Diabetes(ADA). Marland Kitchen   01/26/2018 01:46 PM 5.5 <5.7 % of total Hgb Final    Comment:    For the purpose of screening for the presence of diabetes: . <5.7%       Consistent with the absence of diabetes 5.7-6.4%    Consistent with increased risk for diabetes             (prediabetes) > or =6.5%  Consistent with diabetes . This assay result is consistent with a decreased risk of diabetes. . Currently, no consensus exists regarding use of hemoglobin A1c for diagnosis of diabetes in children. . According to American Diabetes Association (ADA) guidelines, hemoglobin A1c <7.0% represents optimal control in non-pregnant diabetic patients. Different metrics may apply to specific patient populations.  Standards of Medical Care in Diabetes(ADA). .      CBG: No results for input(s): GLUCAP in the last 168 hours.  Review of Systems:    Positive Symptoms in bold:  Constitutional fevers, chills, weight loss, fatigue, anorexia, malaise  Eyes decreased vision, double vision, eye irritation  Ears, Nose, Mouth, Throat sore throat, trouble swallowing, sinus congestion  Cardiovascular chest pain, paroxysmal nocturnal dyspnea, lower ext edema, palpitations   Respiratory SOB, cough, DOE, hemoptysis, wheezing  Gastrointestinal nausea, vomiting, diarrhea  Genitourinary burning with urination, trouble urinating  Musculoskeletal joint aches, joint swelling, back pain  Integumentary  rashes, skin lesions  Neurological focal weakness, focal numbness, trouble speaking, headaches  Psychiatric depression, anxiety, confusion  Endocrine polyuria, polydipsia, cold intolerance, heat intolerance  Hematologic abnormal bruising, abnormal bleeding, unexplained nose bleeds  Allergic/Immunologic recurrent infections, hives, swollen lymph nodes     Past Medical History  She,  has a past medical history of 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   Surgical History    Past Surgical History:  Procedure Laterality Date  . APPENDECTOMY  18  . CHOLECYSTECTOMY  18  . heart cartherization    . LUL  2005   LUL wedge resection/VATS  . LUL  4/08   LUL lobectomy for cystic cavity and Candida, no cancer seen  . THYROIDECTOMY, PARTIAL  2007   Dr. Arlyce Dice     Social History   reports that she quit smoking about 15 years ago. Her smoking use included cigarettes. She has a 45.00 pack-year smoking history. She has never used smokeless tobacco. She reports that she does not drink alcohol and does  not use drugs.   Family History   Her family history includes Cancer in her father and mother; Cancer (age of onset: 23) in her sister; Diabetes in her sister; Hypertension in her father; Multiple sclerosis in her sister.   Allergies Allergies  Allergen Reactions  . Desvenlafaxine Succinate Er Other (See Comments)    shaking  . Lisinopril-Hydrochlorothiazide     REACTION: N/V cough  . Nitrofurantoin Monohyd Macro   . Norvasc [Amlodipine]     Lower extremity edema  . Penicillins     REACTION: Hives  . Zoloft [Sertraline Hcl] Other (See Comments)    "too many side effects"     Home Medications  Prior to Admission medications   Medication Sig Start Date End Date Taking? Authorizing Provider  albuterol (PROVENTIL HFA;VENTOLIN HFA) 108 (90 Base) MCG/ACT inhaler Inhale 2 puffs into the lungs every 6 (six) hours as needed for wheezing or shortness of breath. 06/23/17  Yes Rigoberto Noel, MD  ALPRAZolam Duanne Moron) 1 MG tablet Take 1 tablet (1 mg total) by mouth daily as needed for anxiety. 07/20/20  Yes Breeback, Jade L, PA-C  AMBULATORY NON FORMULARY MEDICATION Hypoxia with positive walk test. O2 2L as needed for SOB and decreased O2 stats with exertion. Pt request portable O2 tanks only. 04/06/18  Yes Breeback, Jade L, PA-C  aspirin EC 81 MG tablet Take 81 mg by mouth daily.   Yes [provider]  atorvastatin (LIPITOR) 20 MG tablet Take 1 tablet (20 mg total) by mouth at bedtime. Labs for further refills 08/07/20  Yes Breeback, Jade L, PA-C  celecoxib (CELEBREX) 200 MG capsule Take 1 capsule (200 mg total) by mouth daily. 03/20/20 03/20/21 Yes Breeback, Jade L, PA-C  cloNIDine (CATAPRES - DOSED IN MG/24 HR) 0.3 mg/24hr patch Place 1 patch (0.3 mg total) onto the skin once a week. 07/20/20  Yes Breeback, Jade L, PA-C  Fluticasone-Umeclidin-Vilant (TRELEGY ELLIPTA) 100-62.5-25 MCG/INH AEPB Inhale 1 puff into the lungs daily. 08/08/20  Yes Breeback, Jade L, PA-C  ipratropium-albuterol (DUONEB)  0.5-2.5 (3) MG/3ML SOLN Take 3 mLs by nebulization every 4 (four) hours as needed. Patient taking differently: Take 3 mLs by nebulization every 4 (four) hours as needed (shotness of breath). 07/20/20  Yes Breeback, Jade L, PA-C  labetalol (NORMODYNE) 200 MG tablet Take 2 tablets (400 mg total) by mouth 2 (two) times daily. 07/20/20  Yes Breeback, Jade L, PA-C  levothyroxine (SYNTHROID) 50 MCG tablet take 1 tablet by mouth once daily 07/20/20  Yes Breeback, Jade L, PA-C  omeprazole (PRILOSEC) 40 MG capsule TAKE 1 CAPSULE(40 MG) BY MOUTH DAILY Patient taking differently: Take 40 mg by mouth daily. 12/30/19  Yes Breeback, Jade L, PA-C  QUEtiapine (SEROQUEL) 100 MG tablet Take 1 tablet (100  mg total) by mouth at bedtime. 01/30/20  Yes Breeback, Jade L, PA-C  valsartan (DIOVAN) 320 MG tablet Take 1 tablet (320 mg total) by mouth daily. 07/20/20  Yes Breeback, Jade L, PA-C  vortioxetine HBr (TRINTELLIX) 20 MG TABS tablet Take 1 tablet (20 mg total) by mouth daily. 07/20/20  Yes Breeback, Royetta Car, PA-C

## 2020-10-02 ENCOUNTER — Encounter (HOSPITAL_COMMUNITY): Payer: Self-pay | Admitting: Internal Medicine

## 2020-10-02 ENCOUNTER — Inpatient Hospital Stay (HOSPITAL_COMMUNITY): Payer: Medicare Other

## 2020-10-02 DIAGNOSIS — E43 Unspecified severe protein-calorie malnutrition: Secondary | ICD-10-CM | POA: Insufficient documentation

## 2020-10-02 LAB — CBC
HCT: 27.9 % — ABNORMAL LOW (ref 36.0–46.0)
Hemoglobin: 8.8 g/dL — ABNORMAL LOW (ref 12.0–15.0)
MCH: 29.5 pg (ref 26.0–34.0)
MCHC: 31.5 g/dL (ref 30.0–36.0)
MCV: 93.6 fL (ref 80.0–100.0)
Platelets: 340 10*3/uL (ref 150–400)
RBC: 2.98 MIL/uL — ABNORMAL LOW (ref 3.87–5.11)
RDW: 13.2 % (ref 11.5–15.5)
WBC: 9.6 10*3/uL (ref 4.0–10.5)
nRBC: 0 % (ref 0.0–0.2)

## 2020-10-02 LAB — BASIC METABOLIC PANEL
Anion gap: 9 (ref 5–15)
BUN: 27 mg/dL — ABNORMAL HIGH (ref 8–23)
CO2: 40 mmol/L — ABNORMAL HIGH (ref 22–32)
Calcium: 8.6 mg/dL — ABNORMAL LOW (ref 8.9–10.3)
Chloride: 87 mmol/L — ABNORMAL LOW (ref 98–111)
Creatinine, Ser: 0.49 mg/dL (ref 0.44–1.00)
GFR, Estimated: >60 mL/min (ref >60)
Glucose, Bld: 103 mg/dL — ABNORMAL HIGH (ref 70–99)
Potassium: 3.6 mmol/L (ref 3.5–5.1)
Sodium: 136 mmol/L (ref 135–145)

## 2020-10-02 MED ORDER — ACETAMINOPHEN 325 MG PO TABS
650.0000 mg | ORAL_TABLET | Freq: Four times a day (QID) | ORAL | Status: DC | PRN
  Administered 2020-10-02: 650 mg via ORAL
  Filled 2020-10-02: qty 2

## 2020-10-02 MED ORDER — AZITHROMYCIN 250 MG PO TABS: 250.0000 mg | ORAL_TABLET | Freq: Every day | ORAL | 0 refills | Status: AC

## 2020-10-02 MED ORDER — HYDRALAZINE HCL 20 MG/ML IJ SOLN
5.0000 mg | INTRAMUSCULAR | Status: DC | PRN
  Administered 2020-10-02: 5 mg via INTRAVENOUS
  Filled 2020-10-02: qty 1

## 2020-10-02 MED ORDER — PREDNISONE 20 MG PO TABS: ORAL_TABLET | ORAL | 0 refills | Status: DC

## 2020-10-02 MED ORDER — CEFDINIR 300 MG PO CAPS: 300.0000 mg | ORAL_CAPSULE | Freq: Two times a day (BID) | ORAL | 0 refills | Status: AC

## 2020-10-02 NOTE — Progress Notes (Addendum)
Physical Therapy Treatment Patient Details Name: [name] MRN: [id number] DOB: 05/20/1946 Today's Date: 10/02/2020    History of Present Illness Pt adm with copd exacerbation. PMH - lung CA, copd, htn.    PT Comments    Pt progressing towards her physical therapy goals. Able to participate in seated exercises and ambulating 30 feet with a walker at a supervision level. SpO2 91-95% on 2L O2. Of note, when seated edge of bed, pt placing her head in her hands. When questioned if she was dizzy, pt responding yes. BP 113/57. D/c plan remains appropriate.     Follow Up Recommendations  Home health PT;Supervision/Assistance - 24 hour     Equipment Recommendations  None recommended by PT    Recommendations for Other Services       Precautions / Restrictions Precautions Precautions: Fall Restrictions Weight Bearing Restrictions: No    Mobility  Bed Mobility Overal bed mobility: Modified Independent             General bed mobility comments: HOB elevated  Transfers Overall transfer level: Needs assistance Equipment used: None Transfers: Sit to/from Stand Sit to Stand: Supervision            Ambulation/Gait Ambulation/Gait assistance: Supervision Gait Distance (Feet): 30 Feet Assistive device: Rolling walker (2 wheeled) Gait Pattern/deviations: Step-through pattern;Decreased stride length Gait velocity: decr   General Gait Details: No gross instability, supervision for safety   Stairs             Wheelchair Mobility    Modified Rankin (Stroke Patients Only)       Balance Overall balance assessment: Needs assistance Sitting-balance support: No upper extremity supported Sitting balance-Leahy Scale: Good     Standing balance support: Bilateral upper extremity supported Standing balance-Leahy Scale: Poor Standing balance comment: required UE support                            Cognition Arousal/Alertness:  Awake/alert Behavior During Therapy: WFL for tasks assessed/performed Overall Cognitive Status: No family/caregiver present to determine baseline cognitive functioning Area of Impairment: Memory;Problem solving                     Memory: Decreased short-term memory       Problem Solving: Requires verbal cues;Requires tactile cues;Slow processing;Difficulty sequencing        Exercises General Exercises - Lower Extremity Long Arc Quad: Both;10 reps;Seated Hip Flexion/Marching: Both;10 reps;Seated    General Comments        Pertinent Vitals/Pain Pain Assessment: Faces Faces Pain Scale: No hurt    Home Living                      Prior Function            PT Goals (current goals can now be found in the care plan section) Acute Rehab PT Goals Patient Stated Goal: go home Potential to Achieve Goals: Good Progress towards PT goals: Progressing toward goals    Frequency    Min 3X/week      PT Plan Current plan remains appropriate    Co-evaluation              AM-PAC PT "6 Clicks" Mobility   Outcome Measure  Help needed turning from your back to your side while in a flat bed without using bedrails?: None Help needed moving from lying on your back to sitting on the side of  a flat bed without using bedrails?: None Help needed moving to and from a bed to a chair (including a wheelchair)?: None Help needed standing up from a chair using your arms (e.g., wheelchair or bedside chair)?: None Help needed to walk in hospital room?: None Help needed climbing 3-5 steps with a railing? : A Lot 6 Click Score: 22    End of Session Equipment Utilized During Treatment: Gait belt;Oxygen Activity Tolerance: Patient tolerated treatment well Patient left: in chair;with call bell/phone within reach Nurse Communication: Mobility status PT Visit Diagnosis: Unsteadiness on feet (R26.81);Muscle weakness (generalized) (M62.81)     Time: 1610-9604 PT Time  Calculation (min) (ACUTE ONLY): 18 min  Charges:  $Therapeutic Activity: 8-22 mins                     Wyona Almas, PT, DPT Acute Rehabilitation Services Pager [phone] Office [phone]    Deno Etienne 10/02/2020, 11:13 AM

## 2020-10-02 NOTE — Progress Notes (Signed)
D/C instructions given and reviewed. No questions asked but encouraged to call with any concerns. Tele and IV removed, tolerated well. 

## 2020-10-02 NOTE — Discharge Summary (Signed)
[facility]  Physician Discharge Summary   Patient ID: [name] MRN: [id number] DOB/AGE: 04-13-1946 74 y.o.  Admit date: 09/29/2020 Discharge date: 10/02/2020  PCP: Donella Stade, PA-C  DISCHARGE DIAGNOSES:  COPD with acute exacerbation Acute on chronic respiratory failure with hypoxia History of non-small cell lung cancer Community-acquired pneumonia Essential hypertension Normocytic anemia Oropharyngeal dysphagia Previous history of PE  RECOMMENDATIONS FOR OUTPATIENT FOLLOW UP: 1. Pulmonology will arrange outpatient follow-up and outpatient CT scan 2. Antibiotics for a few more days   Home Health: PT OT SLP Equipment/Devices: None  CODE STATUS: Full code  DISCHARGE CONDITION: fair  Diet recommendation: Low-sodium  INITIAL HISTORY: 74 y.o.femalewith medical history significant ofNSCLC in L lung s/p lobectomy in 2013, COPD on 3L home O2 and BIPAP QHS, HTN.  Patient presented to the emergency department with complaints of shortness of breath ongoing for about 2 days.  Worse than her baseline.  Noted to be hypoxic as well.  Noted to have elevated WBC.  CT scan raise concern for nodular opacities in the lung.  Consultations:  Pulmonology  Procedures:  None    HOSPITAL COURSE:   COPD with acute exacerbation/acute on chronic respiratory failure with hypoxia Patient placed on steroids nebulizer treatments.  Inhalers being continued.  She is on oxygen at home as well. Stable at 2 to 4 L/min by nasal cannula.  Apparently uses trilogy ventilator at night times.  Non-small cell lung cancer status post lobectomy in 2013 CT angiogram raise concern for interval increase in size of right upper lobe nodule as well as interval development of nodular-like peribronchovascular subsolid opacities within the left lower lobe and right upper lobe.   Patient previously seen by Dr. Lamonte Sakai.    Pulmonology was consulted.  They will see the patient in their  office in 1 month.  They will schedule repeat CT scan in a month and decide further course of action then.  Concern for community-acquired pneumonia Findings noted on the CT scan could either be due to recurrence of her cancer or infection.  Patient was placed on antibacterials. WBC was 15.1 at admission.  Improved to 8.2.  She was changed over to oral antibacterials.  Remains stable.  Mildly elevated troponin Patient denied any chest pain.  EKG did not show any acute ischemic changes.  Likely mild demand ischemia.  Echocardiogram showed normal systolic function.  Grade 2 diastolic dysfunction noted.  No regional wall motion abnormalities.  No significant valvular abnormalities.  Elevated pulmonary pressures were noted likely due to her underlying COPD.    Does not need any further work-up in the hospital.  Follow-up with PCP.  Essential hypertension Continue current medications.  Noted to be on clonidine, Avapro, labetalol.  Occasional high readings noted.    Normocytic anemia Hemoglobin has been stable for the most part.  Slight drop is likely dilutional.  No active bleeding noted  Previous history of pulmonary embolism This was back in 2019.  She has completed course of anticoagulation.  No PE noted on CT scan done during this admission.   Severe malnutrition Nutrition Problem: Severe Malnutrition Etiology: chronic illness (COPD)  Signs/Symptoms: percent weight loss,severe fat depletion,severe muscle depletion Percent weight loss: 1.57 %  Interventions: Ensure Enlive (each supplement provides 350kcal and 20 grams of protein),Magic cup,MVI   Overall patient has been stable.  She had mild epistaxis this morning which has resolved.  Seen by speech therapy.  Underwent modified barium swallow.  Mild dysphagia and aspiration was noted.  They  have educated the patient regarding strategies she can use while swallowing. Okay for discharge home today.  PERTINENT LABS:  The results of  significant diagnostics from this hospitalization (including imaging, microbiology, ancillary and laboratory) are listed below for reference.    Microbiology: Recent Results (from the past 240 hour(s))  Resp Panel by RT-PCR (Flu A&B, Covid) Nasopharyngeal Swab     Status: None   Collection Time: 09/29/20  4:21 PM   Specimen: Nasopharyngeal Swab; Nasopharyngeal(NP) swabs in vial transport medium  Result Value Ref Range Status   SARS Coronavirus 2 by RT PCR NEGATIVE NEGATIVE Final    Comment: (NOTE) SARS-CoV-2 target nucleic acids are NOT DETECTED.  The SARS-CoV-2 RNA is generally detectable in upper respiratory specimens during the acute phase of infection. The lowest concentration of SARS-CoV-2 viral copies this assay can detect is 138 copies/mL. A negative result does not preclude SARS-Cov-2 infection and should not be used as the sole basis for treatment or other patient management decisions. A negative result may occur with  improper specimen collection/handling, submission of specimen other than nasopharyngeal swab, presence of viral mutation(s) within the areas targeted by this assay, and inadequate number of viral copies(<138 copies/mL). A negative result must be combined with clinical observations, patient history, and epidemiological information. The expected result is Negative.  Fact Sheet for Patients:  EntrepreneurPulse.com.au  Fact Sheet for Healthcare Providers:  IncredibleEmployment.be  This test is no t yet approved or cleared by the [place] FDA and  has been authorized for detection and/or diagnosis of SARS-CoV-2 by FDA under an Emergency Use Authorization (EUA). This EUA will remain  in effect (meaning this test can be used) for the duration of the COVID-19 declaration under Section 564(b)(1) of the Act, 21 U.S.C.section 360bbb-3(b)(1), unless the authorization is terminated  or revoked sooner.       Influenza A by PCR  NEGATIVE NEGATIVE Final   Influenza B by PCR NEGATIVE NEGATIVE Final    Comment: (NOTE) The Xpert Xpress SARS-CoV-2/FLU/RSV plus assay is intended as an aid in the diagnosis of influenza from Nasopharyngeal swab specimens and should not be used as a sole basis for treatment. Nasal washings and aspirates are unacceptable for Xpert Xpress SARS-CoV-2/FLU/RSV testing.  Fact Sheet for Patients: EntrepreneurPulse.com.au  Fact Sheet for Healthcare Providers: IncredibleEmployment.be  This test is not yet approved or cleared by the [place] FDA and has been authorized for detection and/or diagnosis of SARS-CoV-2 by FDA under an Emergency Use Authorization (EUA). This EUA will remain in effect (meaning this test can be used) for the duration of the COVID-19 declaration under Section 564(b)(1) of the Act, 21 U.S.C. section 360bbb-3(b)(1), unless the authorization is terminated or revoked.  Performed at [facility], [place] [address] [place], [place] [address]      Labs:  COVID-19 Labs   Lab Results  Component Value Date   [place] NEGATIVE 09/29/2020      Basic Metabolic Panel: Recent Labs  Lab 09/29/20 1407 09/29/20 1658 09/30/20 0407 10/02/20 0420  NA 137 134* 138 136  K 4.3 4.7 5.0 3.6  CL 89*  --  87* 87*  CO2 38*  --  40* 40*  GLUCOSE 166*  --  147* 103*  BUN 15  --  22 27*  CREATININE 0.57  --  0.61 0.49  CALCIUM 9.3  --  9.4 8.6*   CBC: Recent Labs  Lab 09/29/20 1407 09/29/20 1658 09/30/20 0407 10/02/20 0420  WBC 15.1*  --  8.2  9.6  HGB 9.4* 10.5* 9.1* 8.8*  HCT 31.6* 31.0* 28.3* 27.9*  MCV 97.5  --  94.3 93.6  PLT 362  --  297 340     IMAGING STUDIES DG Chest 2 View  Result Date: 09/29/2020 CLINICAL DATA:  Shortness of breath EXAM: CHEST - 2 VIEW COMPARISON:  01/26/2018 FINDINGS: Redemonstrated postoperative findings of left upper lobectomy with volume loss of the left hemithorax and leftward shift  of the mediastinum. Hyperinflation and emphysema of the right lung. The visualized skeletal structures are unremarkable. IMPRESSION: Redemonstrated postoperative findings of left upper lobectomy. Hyperinflation and emphysema of the right lung. No acute abnormality of the lungs. Electronically Signed   By: Eddie Candle M.D.   On: 09/29/2020 15:08   CT Angio Chest PE W and/or Wo Contrast  Result Date: 09/29/2020 CLINICAL DATA:  Shortness of breath. COPD. History of lung cancer status post left upper lobectomy. 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 EXAM: CT ANGIOGRAPHY CHEST WITH CONTRAST TECHNIQUE: Multidetector CT imaging of the chest was performed using the standard protocol during bolus administration of intravenous contrast. Multiplanar CT image reconstructions and MIPs were obtained to evaluate the vascular anatomy. CONTRAST:  27mL OMNIPAQUE IOHEXOL 350 MG/ML SOLN COMPARISON:  PET CT 4/27/7, CT angio chest 01/27/2018. FINDINGS: Cardiovascular: Satisfactory opacification of the pulmonary arteries to the segmental level. No evidence of pulmonary embolism. The main pulmonary artery is enlarged measuring up to 3.1 cm. Normal heart size. No significant pericardial effusion. The thoracic aorta is normal in caliber. Aortic valve leaflet calcifications-mild. Moderate atherosclerotic plaque of the thoracic aorta. At least mild 2 vessel coronary artery calcifications. Mediastinum/Nodes: Enlarged prevascular lymph node measuring up to 1 cm (5:50). Enlarged 1 cm right hilar lymph node (5: 66). No definite no axillary lymph nodes. Thyroid gland, trachea, and esophagus demonstrate no significant findings. Lungs/Pleura: Status post left upper lobectomy. Severe centrilobular emphysematous changes. Bilateral lower lobe calcified nodules likely representing granulomas. Interval development of left lower lobe peribronchovascular nodular-like subsolid densities with as an example a  1.2 cm lesion (6:76) and a 1 cm lesion of lesion (6: 94). Interval increase in size of a right upper lobe subsolid nodular-like density measuring 1.1 cm (6:40). Interval development of several of the right upper lobe areas of subsolid opacities more inferiorly (as an example: 6:36). A 5 mm right lower lobe ground-glass pulmonary nodule (6:97). No pleural effusion. No pneumothorax. Upper Abdomen: No acute abnormality. Musculoskeletal: No abdominal wall hernia or abnormality No suspicious lytic or blastic osseous lesions. Similar-appearing cortical irregularity of the left upper ribs. No acute displaced fracture. Multilevel degenerative changes of the spine. Review of the MIP images confirms the above findings. IMPRESSION: 1. Interval increase in size of a right upper lobe 1.1 cm subsolid nodular-like opacity as well as interval development of nodular-like peribronchovascular subsolid opacities within the left lower lobe and right upper lobe. Findings suspicious for malignancy (such as adenocarcinoma) versus infection/inflammation. Additional imaging evaluation or consultation with Pulmonology or Thoracic Surgery recommended. 2. Borderline enlarged prevascular and right hilar lymph nodes likely reactive in etiology. 3. Borderline enlarged main pulmonary artery. Correlate with pulmonary hypertension. 4. Aortic Atherosclerosis (ICD10-I70.0) and Emphysema (ICD10-J43.9). Electronically Signed   By: Iven Finn M.D.   On: 09/29/2020 21:02   DG CHEST PORT 1 VIEW  Result Date: 10/01/2020 CLINICAL DATA:  74 year old female with congestive heart failure. History of lung cancer, left upper lobectomy. EXAM: PORTABLE CHEST 1 VIEW COMPARISON:  Chest CT 09/29/2020 and earlier. FINDINGS:  p Portable AP upright view at 1038 hours. Stable postoperative changes to the left hemithorax with leftward shift of the mediastinum. Calcified aortic atherosclerosis. Visualized tracheal air column is within normal limits. Sub solid right  upper lobe and left lower lobe peribronchial opacities seen by CT are not evident. No new pulmonary opacity. Stable visualized osseous structures. Stable left neck and thoracic inlet surgical clips. IMPRESSION: 1. Stable postoperative appearance of the chest. Right upper lobe and left lower lobe sub solid pulmonary opacity on recent CT is radiographically occult. 2. No new cardiopulmonary abnormality. Electronically Signed   By: Genevie Ann M.D.   On: 10/01/2020 11:18   ECHOCARDIOGRAM COMPLETE  Result Date: 09/30/2020    ECHOCARDIOGRAM REPORT   Patient Name:   [name] Date of Exam: 09/30/2020 Medical Rec #:  [id number]      Height:       64.0 in Accession #:    [id number]     Weight:       97.7 lb Date of Birth:  06-02-1946      BSA:          1.443 m Patient Age:    14 years       BP:           147/56 mmHg Patient Gender: F              HR:           74 bpm. Exam Location:  Inpatient Procedure: 2D Echo, Cardiac Doppler and Color Doppler Indications:    Elevated Troponin.  History:        Patient has prior history of Echocardiogram examinations, most                 recent 02/05/2018. CAD, COPD; Risk Factors:Hypertension. Cancer.  Sonographer:    Jonelle Sidle Dance Referring Phys: [place]  1. Left ventricular ejection fraction, by estimation, is 55 to 60%. The left ventricle has normal function. The left ventricle has no regional wall motion abnormalities. Left ventricular diastolic parameters are consistent with Grade II diastolic dysfunction (pseudonormalization). Elevated left ventricular end-diastolic pressure.  2. Right ventricular systolic function is normal. The right ventricular size is normal. There is mildly elevated pulmonary artery systolic pressure.  3. Left atrial size was mildly dilated.  4. The mitral valve is normal in structure. Mild mitral valve regurgitation. No evidence of mitral stenosis.  5. Nodular calcification of non coronary cusp. The aortic valve is tricuspid.  Aortic valve regurgitation is not visualized. Mild to moderate aortic valve sclerosis/calcification is present, without any evidence of aortic stenosis.  6. The inferior vena cava is normal in size with greater than 50% respiratory variability, suggesting right atrial pressure of 3 mmHg. FINDINGS  Left Ventricle: Left ventricular ejection fraction, by estimation, is 55 to 60%. The left ventricle has normal function. The left ventricle has no regional wall motion abnormalities. The left ventricular internal cavity size was normal in size. There is  no left ventricular hypertrophy. Left ventricular diastolic parameters are consistent with Grade II diastolic dysfunction (pseudonormalization). Elevated left ventricular end-diastolic pressure. Right Ventricle: The right ventricular size is normal. No increase in right ventricular wall thickness. Right ventricular systolic function is normal. There is mildly elevated pulmonary artery systolic pressure. The tricuspid regurgitant velocity is 2.70  m/s, and with an assumed right atrial pressure of 8 mmHg, the estimated right ventricular systolic pressure is 82.9 mmHg. Left Atrium: Left atrial size was mildly dilated. Right  Atrium: Right atrial size was normal in size. Pericardium: There is no evidence of pericardial effusion. Mitral Valve: The mitral valve is normal in structure. Mild mitral valve regurgitation. No evidence of mitral valve stenosis. Tricuspid Valve: The tricuspid valve is normal in structure. Tricuspid valve regurgitation is mild . No evidence of tricuspid stenosis. Aortic Valve: Nodular calcification of non coronary cusp. The aortic valve is tricuspid. Aortic valve regurgitation is not visualized. Mild to moderate aortic valve sclerosis/calcification is present, without any evidence of aortic stenosis. Pulmonic Valve: The pulmonic valve was normal in structure. Pulmonic valve regurgitation is not visualized. No evidence of pulmonic stenosis. Aorta: The  aortic root is normal in size and structure. Venous: The inferior vena cava is normal in size with greater than 50% respiratory variability, suggesting right atrial pressure of 3 mmHg. IAS/Shunts: No atrial level shunt detected by color flow Doppler.  LEFT VENTRICLE PLAX 2D LVIDd:         4.40 cm  Diastology LVIDs:         3.00 cm  LV e' medial:    6.20 cm/s LV PW:         1.00 cm  LV E/e' medial:  18.4 LV IVS:        1.00 cm  LV e' lateral:   6.09 cm/s LVOT diam:     1.80 cm  LV E/e' lateral: 18.7 LV SV:         64 LV SV Index:   45 LVOT Area:     2.54 cm  RIGHT VENTRICLE             IVC RV Basal diam:  2.40 cm     IVC diam: 2.40 cm RV S prime:     12.10 cm/s TAPSE (M-mode): 2.0 cm LEFT ATRIUM             Index       RIGHT ATRIUM           Index LA diam:        4.10 cm 2.84 cm/m  RA Area:     10.50 cm LA Vol (A2C):   78.8 ml 54.62 ml/m RA Volume:   22.20 ml  15.39 ml/m LA Vol (A4C):   76.8 ml 53.24 ml/m LA Biplane Vol: 80.7 ml 55.94 ml/m  AORTIC VALVE LVOT Vmax:   97.90 cm/s LVOT Vmean:  66.200 cm/s LVOT VTI:    0.253 m  AORTA Ao Root diam: 2.80 cm Ao Asc diam:  2.90 cm MITRAL VALVE                TRICUSPID VALVE MV Area (PHT): 3.65 cm     TR Peak grad:   29.2 mmHg MV Decel Time: 208 msec     TR Vmax:        270.00 cm/s MV E velocity: 114.00 cm/s MV A velocity: 108.00 cm/s  SHUNTS MV E/A ratio:  1.06         Systemic VTI:  0.25 m                             Systemic Diam: 1.80 cm Jenkins Rouge MD Electronically signed by Jenkins Rouge MD Signature Date/Time: 09/30/2020/10:09:04 AM    Final     DISCHARGE EXAMINATION: Vitals:   10/02/20 0633 10/02/20 0720 10/02/20 0820 10/02/20 1120  BP: (!) 180/60  (!) 137/47 (!) 138/54  Pulse: 68  71 63  Resp: 18  18 (!) 21  Temp: 98.4 F (36.9 C)   98.3 F (36.8 C)  TempSrc: Oral   Oral  SpO2: 100% 98% 99% 99%  Weight:      Height:       General appearance: Awake alert.  In no distress Resp: Improved effort.  Coarse breath sound bilaterally with few  crackles at bases. Cardio: S1-S2 is normal regular.  No S3-S4.  No rubs murmurs or bruit GI: Abdomen is soft.  Nontender nondistended.  Bowel sounds are present normal.  No masses organomegaly    DISPOSITION: Home with granddaughter  Discharge Instructions    Call MD for:  difficulty breathing, headache or visual disturbances   Complete by: As directed    Call MD for:  extreme fatigue   Complete by: As directed    Call MD for:  persistant dizziness or light-headedness   Complete by: As directed    Call MD for:  persistant nausea and vomiting   Complete by: As directed    Call MD for:  severe uncontrolled pain   Complete by: As directed    Call MD for:  temperature >100.4   Complete by: As directed    Diet Heart   Complete by: As directed    Discharge instructions   Complete by: As directed    Please take your medications as prescribed.  Your lung doctor will arrange outpatient follow-up in a month along with a repeat CT scan at that time.  Antibiotics have been prescribed to you.  Seek attention if your symptoms were to get worse.  You were cared for by a hospitalist during your hospital stay. If you have any questions about your discharge medications or the care you received while you were in the hospital after you are discharged, you can call the unit and asked to speak with the hospitalist on call if the hospitalist that took care of you is not available. Once you are discharged, your primary care physician will handle any further medical issues. Please note that NO REFILLS for any discharge medications will be authorized once you are discharged, as it is imperative that you return to your primary care physician (or establish a relationship with a primary care physician if you do not have one) for your aftercare needs so that they can reassess your need for medications and monitor your lab values. If you do not have a primary care physician, you can call [phone] for a physician  referral.   Increase activity slowly   Complete by: As directed          Allergies as of 10/02/2020      Reactions   Desvenlafaxine Succinate Er Other (See Comments)   shaking   Lisinopril-hydrochlorothiazide    REACTION: N/V cough   Nitrofurantoin Monohyd Macro    Norvasc [amlodipine]    Lower extremity edema   Penicillins    REACTION: Hives   Zoloft [sertraline Hcl] Other (See Comments)   "too many side effects"      Medication List    TAKE these medications   albuterol 108 (90 Base) MCG/ACT inhaler Commonly known as: VENTOLIN HFA Inhale 2 puffs into the lungs every 6 (six) hours as needed for wheezing or shortness of breath.   ALPRAZolam 1 MG tablet Commonly known as: XANAX Take 1 tablet (1 mg total) by mouth daily as needed for anxiety.   AMBULATORY NON FORMULARY MEDICATION Hypoxia with positive walk test. O2 2L as needed for SOB and decreased O2  stats with exertion. Pt request portable O2 tanks only.   aspirin EC 81 MG tablet Take 81 mg by mouth daily.   atorvastatin 20 MG tablet Commonly known as: LIPITOR Take 1 tablet (20 mg total) by mouth at bedtime. Labs for further refills   azithromycin 250 MG tablet Commonly known as: ZITHROMAX Take 1 tablet (250 mg total) by mouth daily for 3 days.   cefdinir 300 MG capsule Commonly known as: OMNICEF Take 1 capsule (300 mg total) by mouth every 12 (twelve) hours for 5 days.   celecoxib 200 MG capsule Commonly known as: CeleBREX Take 1 capsule (200 mg total) by mouth daily.   cloNIDine 0.3 mg/24hr patch Commonly known as: CATAPRES - Dosed in mg/24 hr Place 1 patch (0.3 mg total) onto the skin once a week.   ipratropium-albuterol 0.5-2.5 (3) MG/3ML Soln Commonly known as: DUONEB Take 3 mLs by nebulization every 4 (four) hours as needed. What changed: reasons to take this   labetalol 200 MG tablet Commonly known as: NORMODYNE Take 2 tablets (400 mg total) by mouth 2 (two) times daily.   levothyroxine 50  MCG tablet Commonly known as: SYNTHROID take 1 tablet by mouth once daily   omeprazole 40 MG capsule Commonly known as: PRILOSEC TAKE 1 CAPSULE(40 MG) BY MOUTH DAILY What changed:   how much to take  how to take this  when to take this  additional instructions   predniSONE 20 MG tablet Commonly known as: DELTASONE Take 3 tablets once daily for 3 days followed by 2 tablets once daily for 3 days followed by 1 tablet once daily for 3 days and then stop   QUEtiapine 100 MG tablet Commonly known as: SEROQUEL Take 1 tablet (100 mg total) by mouth at bedtime.   Trelegy Ellipta 100-62.5-25 MCG/INH Aepb Generic drug: Fluticasone-Umeclidin-Vilant Inhale 1 puff into the lungs daily.   valsartan 320 MG tablet Commonly known as: DIOVAN Take 1 tablet (320 mg total) by mouth daily.   vortioxetine HBr 20 MG Tabs tablet Commonly known as: TRINTELLIX Take 1 tablet (20 mg total) by mouth daily.         Follow-up Information    Collene Gobble, MD Follow up in 1 month(s).   Specialty: Pulmonary Disease Why: We will call you with appt date and time. Contact information: [address] [place] [address] [phone]        Donella Stade, PA-C. Schedule an appointment as soon as possible for a visit in 1 week(s).   Specialty: Family Medicine Contact information: [address] [place] [address] [phone]        Care, [facility] Follow up.   Why: HHPT, HHOT Contact information: [place] [address] [phone]               TOTAL DISCHARGE TIME: 24 minutes  [place]  [facility] Pager on [email or web address]  10/02/2020, 2:00 PM

## 2020-10-02 NOTE — Evaluation (Signed)
Clinical/Bedside Swallow Evaluation Patient Details  Name: [name] MRN: [id number] Date of Birth: 05/27/1946  Today's Date: 10/02/2020 Time: SLP Start Time (ACUTE ONLY): 0913 SLP Stop Time (ACUTE ONLY): 0937 SLP Time Calculation (min) (ACUTE ONLY): 24 min  Past Medical History:  Past Medical History:  Diagnosis Date  . 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 CAD (coronary artery disease)    20% stenosis on cath - no intervention required (W-S cards), negative nuclear stress test 11/07  . Cancer [facility]) 2007   lung (Dr. Earlie Server and Dr. Arlyce Dice)  . COPD (chronic obstructive pulmonary disease) ([place]) 9/10   golds stage III, FeV1 39%  . Hurthle cell adenoma of thyroid 2007  . Hypertension   . Iron deficiency anemia 2/08   s/p 2 unit transfusion  . Lung cancer ([place])   . Peripheral vascular disease ([place])   . RLS (restless legs syndrome)    Past Surgical History:  Past Surgical History:  Procedure Laterality Date  . APPENDECTOMY  18  . CHOLECYSTECTOMY  18  . heart cartherization    . LUL  2005   LUL wedge resection/VATS  . LUL  4/08   LUL lobectomy for cystic cavity and Candida, no cancer seen  . THYROIDECTOMY, PARTIAL  2007   Dr. Arlyce Dice   HPI:  Pt is a 74 yo female presenting with dyspnea. CT showed interval increase in size of LUL nodule and scattered new peribronchovascular nodules. Admitted with COPD exacerbation and possible PNA with concern for aspiration. PMH includes: NSCLC in L lung s/p lobectomy in 2013, COPD on 3L home O2/BiPAP QHS, HTN   Assessment / Plan / Recommendation Clinical Impression  Pt presents with mostly functional appearing swallowing function but seems to be most impacted by her respiratory status, as she is mildly dsypneic at rest. She takes very small bites/sips, which is suspected to be a compensatory measure to better coordinate her breathing/swallowing. When she takes larger volumes, she appears to have increased difficulty coordinating her  swallow although no overt coughing is noted. Her hx does put her at risk for some silent aspiration, but she cannot be challenged with consecutive drinking. Pt's subjective symptoms also appear to be more esophageal, including that she feels "choked" on solids. There is frequent eructation throughout this evaluation. Given limited ability to challenge this pt clinically as well as potential for silent aspiration, recommend proceeding with MBS to more definitively assess swallowing function. SLP Visit Diagnosis: Dysphagia, unspecified (R13.10)    Aspiration Risk  Mild aspiration risk    Diet Recommendation Regular;Thin liquid   Liquid Administration via: Cup;Straw Medication Administration: Whole meds with liquid Supervision: Patient able to self feed;Intermittent supervision to cue for compensatory strategies Compensations: Slow rate;Small sips/bites Postural Changes: Seated upright at 90 degrees;Remain upright for at least 30 minutes after po intake    Other  Recommendations     Follow up Recommendations  (tba)      Frequency and Duration            Prognosis Prognosis for Safe Diet Advancement: Good      Swallow Study   General HPI: Pt is a 74 yo female presenting with dyspnea. CT showed interval increase in size of LUL nodule and scattered new peribronchovascular nodules. Admitted with COPD exacerbation and possible PNA with concern for aspiration. PMH includes: NSCLC in L lung s/p lobectomy in 2013, COPD on 3L home O2/BiPAP QHS, HTN Type of Study: Bedside Swallow Evaluation Diet Prior to this Study: Regular;Thin liquids Temperature  Spikes Noted: No Respiratory Status: Nasal cannula History of Recent Intubation: No Behavior/Cognition: Alert;Cooperative Oral Cavity Assessment: Within Functional Limits Oral Care Completed by SLP: No Oral Cavity - Dentition: Missing dentition (sometimes wears dentures, did not place them for trials) Vision: Functional for  self-feeding Self-Feeding Abilities: Able to feed self Patient Positioning: Upright in bed Baseline Vocal Quality: Normal Volitional Swallow: Able to elicit    Oral/Motor/Sensory Function Overall Oral Motor/Sensory Function: Within functional limits   Ice Chips Ice chips: Not tested   Thin Liquid Thin Liquid: Impaired Presentation: Self Fed;Straw Pharyngeal  Phase Impairments: Other (comments) (appears discoordinated with larger sips)    Nectar Thick Nectar Thick Liquid: Not tested   Honey Thick Honey Thick Liquid: Not tested   Puree Puree: Not tested (pt declined)   Solid     Solid: Within functional limits      Osie Bond., M.A. [place] Pager [phone] Office [phone]  10/02/2020,9:43 AM

## 2020-10-02 NOTE — TOC Transition Note (Addendum)
Transition of Care [facility]) - CM/SW Discharge Note   Patient Details  Name: [name] MRN: [id number] Date of Birth: October 29, 1945  Transition of Care [facility]) CM/SW Contact:  Zenon Mayo, RN Phone Number: 10/02/2020, 10:30 AM   Clinical Narrative:    NCM offered choice to patient, she states she does not have a preference for HHPT, [place],  NCM made referral to [facility] with [facility] .  She is able to take referral.  Soc will begin 24 to 48 hrs post dc.  She states she has all the DME she needs.  She lives with her grand daughter and her husband and their child.  Per Speech therapy patient will also need HHSP,  NCM notified Cheryl with [facility].     Final next level of care: [place] Barriers to Discharge: No Barriers Identified   Patient Goals and CMS Choice Patient states their goals for this hospitalization and ongoing recovery are:: get better CMS Medicare.gov Compare Post Acute Care list provided to:: Patient Choice offered to / list presented to : Patient  Discharge Placement                       Discharge Plan and Services   Discharge Planning Services: CM Consult Post Acute Care Choice: Home Health            DME Agency: NA       HH Arranged: PT,OT, Speech HH Agency: [place] Date [place]: 10/02/20 Time [place]: [id number] Representative spoke with at [place]: [place] Determinants of Health ([place]) Interventions Transportation Interventions: Intervention Not Indicated   Readmission Risk Interventions Readmission Risk Prevention Plan 10/02/2020  Transportation Screening Complete  PCP or Specialist Appt within 3-5 Days Complete  HRI or [place] Complete  Social Work Consult for [place] Planning/Counseling Complete  Palliative Care Screening Not Applicable  Medication Review Press photographer) Complete  Some recent data might be hidden

## 2020-10-02 NOTE — TOC Initial Note (Signed)
Transition of Care [facility]) - Initial/Assessment Note    Patient Details  Name: [name] MRN: [id number] Date of Birth: May 01, 1946  [facility]) CM/SW Contact:    Zenon Mayo, RN Phone Number: 10/02/2020, 10:28 AM  Clinical Narrative:                 NCM offered choice to patient, she states she does not have a preference for HHPT, [place],  NCM made referral to [facility] with [facility] .  She is able to take referral.  Soc will begin 24 to 48 hrs post dc.  She states she has all the DME she needs.  She lives with her grand daughter and her husband and their child.    Expected Discharge Plan: [place] Barriers to Discharge: No Barriers Identified   Patient Goals and CMS Choice Patient states their goals for this hospitalization and ongoing recovery are:: get better CMS Medicare.gov Compare Post Acute Care list provided to:: Patient Choice offered to / list presented to : Patient  Expected Discharge Plan and Services Expected Discharge Plan: [place]   Discharge Planning Services: CM Consult Post Acute Care Choice: [place] arrangements for the past 2 months: Single Family Home                   DME Agency: NA       HH Arranged: PT,OT [place] Agency: [place] Date [place]: 10/02/20 Time HH Agency Contacted: 79 Representative spoke with at [place]: Malachy Mood  Prior Living Arrangements/Services Living arrangements for the past 2 months: [place] with:: Relatives Patient language and need for interpreter reviewed:: Yes Do you feel safe going back to the place where you live?: Yes      Need for Family Participation in Patient Care: Yes (Comment) Care giver support system in place?: Yes (comment)   Criminal Activity/Legal Involvement Pertinent to Current Situation/Hospitalization: No - Comment as needed  Activities of Daily Living      Permission  Sought/Granted                  Emotional Assessment   Attitude/Demeanor/Rapport: Engaged Affect (typically observed): Appropriate Orientation: : Oriented to Self,Oriented to Place,Oriented to  Time,Oriented to Situation Alcohol / Substance Use: Not Applicable Psych Involvement: No (comment)  Admission diagnosis:  SOB (shortness of breath) [R06.02] Elevated troponin [R77.8] COPD exacerbation ([place]) [J44.1] Community acquired pneumonia, unspecified laterality [J18.9] Acute respiratory failure ([place]) [J96.00] Patient Active Problem List   Diagnosis Date Noted  . Protein-calorie malnutrition, severe 10/02/2020  . Acute respiratory failure ([place]) 09/30/2020  . Right upper lobe pulmonary nodule 09/29/2020  . COPD exacerbation ([place]) 09/29/2020  . Elevated troponin 09/29/2020  . CAP (community acquired pneumonia) 09/29/2020  . Dyslipidemia (high LDL; low HDL) 07/27/2020  . Chronic daily headache 01/06/2020  . Hypercapnia 06/28/2019  . RLS (restless legs syndrome) 06/28/2019  . Hyponatremia 05/04/2019  . Epigastric pain 05/04/2019  . Anemia of chronic disease 12/20/2018  . Essential hypertension, benign 07/30/2018  . Headache, new daily persistent ([place]) 07/28/2018  . Breast asymmetry 06/21/2018  . Chronic respiratory failure ([place]) 04/02/2018  . Venous stasis of lower extremity 02/25/2018  . Primary osteoarthritis of both hands 02/25/2018  . History of pulmonary embolus (PE) 01/29/2018  . Exertional chest pain 01/26/2018  . Depression with anxiety 08/01/2017  . Lung cancer ([place]) 08/01/2017  . Cavus foot, acquired 04/28/2017  .  Left foot pain 02/17/2017  . Chronic pain of left ankle 12/16/2016  . Osteopenia 04/30/2016  . Anxiety as acute reaction to exceptional stress 12/05/2015  . Muscle spasm of back 12/05/2015  . Memory changes 12/05/2015  . Elevated blood pressure 10/16/2015  . Split S2 (second heart sound) 10/16/2015  . IFG (impaired fasting glucose) 04/12/2015  .  Transient cerebral ischemic attack 03/01/2014  . Carotid artery narrowing 02/27/2014  . Clinical depression 02/27/2014  . Episode of abnormal behavior 11/28/2013  . Word finding difficulty 11/28/2013  . Memory loss 11/28/2013  . Systolic murmur 09/47/0962  . Sensorineural hearing loss of both ears 11/18/2013  . Insomnia 09/04/2013  . GERD (gastroesophageal reflux disease) 09/04/2013  . History of hip fracture 09/02/2013  . Hypoxemia 06/22/2013  . Cerebrovascular accident, old 06/14/2013  . HYPOTHYROIDISM, POSTSURGICAL 05/19/2007  . DISEASE, ISCHEMIC HEART, CHRONIC NOS 05/19/2007  . ANEMIA, IRON DEFICIENCY NOS 01/04/2007  . History of lung cancer 07/28/2006  . HYPERCHOLESTEROLEMIA 07/28/2006  . Major depressive disorder, recurrent episode ([place]) 07/28/2006  . Anxiety state 07/28/2006  . PERIPHERAL VASCULAR DISEASE, UNSPEC. 07/28/2006  . COPD with chronic bronchitis ([place]) 07/28/2006   PCP:  Donella Stade, PA-C Pharmacy:   [place], [place] [address] Phone: [phone] Fax: [place], [place] [address] [place] [address] Phone: [phone] Fax: [phone]     Social Determinants of Health (SDOH) Interventions Transportation Interventions: Intervention Not Indicated  Readmission Risk Interventions Readmission Risk Prevention Plan 10/02/2020  Transportation Screening Complete  PCP or Specialist Appt within 3-5 Days Complete  HRI or Home Care Consult Complete  Social Work Consult for [place] Planning/Counseling Complete  Palliative Care Screening Not Applicable  Medication Review Press photographer) Complete  Some recent data might be hidden

## 2020-10-02 NOTE — Discharge Instructions (Signed)
Community-Acquired Pneumonia, Adult Pneumonia is an infection of the lungs. It causes swelling in the airways of the lungs. Mucus and fluid may also build up inside the airways. One type of pneumonia can happen while a person is in a hospital. A different type can happen when a person is not in a hospital (community-acquired pneumonia).  What are the causes?  This condition is caused by germs (viruses, bacteria, or fungi). Some types of germs can be passed from one person to another. This can happen when you breathe in droplets from the cough or sneeze of an infected person. What increases the risk? You are more likely to develop this condition if you:  Have a long-term (chronic) disease, such as: ? Chronic obstructive pulmonary disease (COPD). ? Asthma. ? Cystic fibrosis. ? Congestive heart failure. ? Diabetes. ? Kidney disease.  Have HIV.  Have sickle cell disease.  Have had your spleen removed.  Do not take good care of your teeth and mouth (poor dental hygiene).  Have a medical condition that increases the risk of breathing in droplets from your own mouth and nose.  Have a weakened body defense system (immune system).  Are a smoker.  Travel to areas where the germs that cause this illness are common.  Are around certain animals or the places they live. What are the signs or symptoms?  A dry cough.  A wet (productive) cough.  Fever.  Sweating.  Chest pain. This often happens when breathing deeply or coughing.  Fast breathing or trouble breathing.  Shortness of breath.  Shaking chills.  Feeling tired (fatigue).  Muscle aches. How is this treated? Treatment for this condition depends on many things. Most adults can be treated at home. In some cases, treatment must happen in a hospital. Treatment may include:  Medicines given by mouth or through an IV tube.  Being given extra oxygen.  Respiratory therapy. In rare cases, treatment for very bad pneumonia  may include:  Using a machine to help you breathe.  Having a procedure to remove fluid from around your lungs. Follow these instructions at home: Medicines  Take over-the-counter and prescription medicines only as told by your doctor. ? Only take cough medicine if you are losing sleep.  If you were prescribed an antibiotic medicine, take it as told by your doctor. Do not stop taking the antibiotic even if you start to feel better. General instructions   Sleep with your head and neck raised (elevated). You can do this by sleeping in a recliner or by putting a few pillows under your head.  Rest as needed. Get at least 8 hours of sleep each night.  Drink enough water to keep your pee (urine) pale yellow.  Eat a healthy diet that includes plenty of vegetables, fruits, whole grains, low-fat dairy products, and lean protein.  Do not use any products that contain nicotine or tobacco. These include cigarettes, e-cigarettes, and chewing tobacco. If you need help quitting, ask your doctor.  Keep all follow-up visits as told by your doctor. This is important. How is this prevented? A shot (vaccine) can help prevent pneumonia. Shots are often suggested for:  People older than 73 years of age.  People older than 73 years of age who: ? Are having cancer treatment. ? Have long-term (chronic) lung disease. ? Have problems with their body's defense system. You may also prevent pneumonia if you take these actions:  Get the flu (influenza) shot every year.  Go to the dentist as   often as told.  Wash your hands often. If you cannot use soap and water, use hand sanitizer. Contact a doctor if:  You have a fever.  You lose sleep because your cough medicine does not help. Get help right away if:  You are short of breath and it gets worse.  You have more chest pain.  Your sickness gets worse. This is very serious if: ? You are an older adult. ? Your body's defense system is weak.  You  cough up blood. Summary  Pneumonia is an infection of the lungs.  Most adults can be treated at home. Some will need treatment in a hospital.  Drink enough water to keep your pee pale yellow.  Get at least 8 hours of sleep each night. This information is not intended to replace advice given to you by your health care provider. Make sure you discuss any questions you have with your health care provider. Document Revised: 01/26/2019 Document Reviewed: 06/03/2018 Elsevier Patient Education  2020 Elsevier Inc.  

## 2020-10-02 NOTE — Progress Notes (Signed)
Modified Barium Swallow Progress Note  Patient Details  Name: [name] MRN: [id number] Date of Birth: 03/12/1946  Today's Date: 10/02/2020  Modified Barium Swallow completed.  Full report located under Chart Review in the Imaging Section.  Brief recommendations include the following:  Clinical Impression  Pt presents with a mild oropharyngeal dysphagia c/b premature spillage and reduced laryngeal closure, with timing deficit that results in reduced airway protection. Thin liquids consistently enter the airway before or during the swallow, primarily penetrated to the vocal folds but with aspiration also observed. Only larger amounts of aspiration elicit a reflexive cough, but cued cough/throat clear is also effective at clearing the airway. Attempted a few additional strategies, but this study was also limited by the amount that she would consume, and none of the other strategies tested were effective. Pt also has penetration with nectar thick liquids but it is not as readily cleared. Better airway protection was observed with honey thick liquids and solids, but she would only drink a single sip of honey thick liquids, so intermittent penetration cannot be excluded. Pt also says she isn't sure she would adhere to the thickened liquids at home. With solids, pt has prolonged mastication and mild oral residue, but suspect that this would improve when her dentures are in place. Recommend continuing regular solids and thin liquids but with use of an immediate cough or throat clear after sips of liquids. She was educated on this as well as recommendation for meds whole in puree. Would encourage thorough oral hygiene and [place] SLP f/u as well.   Swallow Evaluation Recommendations       SLP Diet Recommendations: Regular solids;Thin liquid   Liquid Administration via: Cup   Medication Administration: Whole meds with puree   Supervision: Patient able to self feed;Intermittent supervision to cue  for compensatory strategies   Compensations: Slow rate;Small sips/bites;Hard cough after swallow;Clear throat after each swallow   Postural Changes: Seated upright at 90 degrees   Oral Care Recommendations: Oral care BID   Follow up: [facility] SLP        Osie Bond., M.A. [place] Pager [phone] Office [phone]  10/02/2020,2:26 PM

## 2020-10-02 NOTE — Plan of Care (Signed)
  Problem: Education: Goal: Knowledge of General Education information will improve Description: Including pain rating scale, medication(s)/side effects and non-pharmacologic comfort measures Outcome: Adequate for Discharge   Problem: Health Behavior/Discharge Planning: Goal: Ability to manage health-related needs will improve Outcome: Adequate for Discharge   Problem: Clinical Measurements: Goal: Ability to maintain clinical measurements within normal limits will improve Outcome: Adequate for Discharge Goal: Will remain free from infection Outcome: Adequate for Discharge Goal: Diagnostic test results will improve Outcome: Adequate for Discharge Goal: Respiratory complications will improve Outcome: Adequate for Discharge Goal: Cardiovascular complication will be avoided Outcome: Adequate for Discharge   Problem: Activity: Goal: Risk for activity intolerance will decrease Outcome: Adequate for Discharge   Problem: Nutrition: Goal: Adequate nutrition will be maintained Outcome: Adequate for Discharge   Problem: Coping: Goal: Level of anxiety will decrease Outcome: Adequate for Discharge   Problem: Elimination: Goal: Will not experience complications related to bowel motility Outcome: Adequate for Discharge Goal: Will not experience complications related to urinary retention Outcome: Adequate for Discharge   Problem: Pain Managment: Goal: General experience of comfort will improve Outcome: Adequate for Discharge   Problem: Safety: Goal: Ability to remain free from injury will improve Outcome: Adequate for Discharge   Problem: Skin Integrity: Goal: Risk for impaired skin integrity will decrease Outcome: Adequate for Discharge   Problem: Activity: Goal: Ability to tolerate increased activity will improve Outcome: Adequate for Discharge   Problem: Clinical Measurements: Goal: Ability to maintain a body temperature in the normal range will improve Outcome: Adequate  for Discharge   Problem: Respiratory: Goal: Ability to maintain adequate ventilation will improve Outcome: Adequate for Discharge Goal: Ability to maintain a clear airway will improve Outcome: Adequate for Discharge   Problem: Education: Goal: Knowledge of disease or condition will improve Outcome: Adequate for Discharge Goal: Knowledge of the prescribed therapeutic regimen will improve Outcome: Adequate for Discharge Goal: Individualized Educational Video(s) Outcome: Adequate for Discharge   Problem: Activity: Goal: Ability to tolerate increased activity will improve Outcome: Adequate for Discharge Goal: Will verbalize the importance of balancing activity with adequate rest periods Outcome: Adequate for Discharge   Problem: Respiratory: Goal: Ability to maintain a clear airway will improve Outcome: Adequate for Discharge Goal: Levels of oxygenation will improve Outcome: Adequate for Discharge Goal: Ability to maintain adequate ventilation will improve Outcome: Adequate for Discharge

## 2020-10-05 DIAGNOSIS — Z9181 History of falling: Secondary | ICD-10-CM | POA: Insufficient documentation

## 2020-10-05 DIAGNOSIS — J441 Chronic obstructive pulmonary disease with (acute) exacerbation: Secondary | ICD-10-CM | POA: Diagnosis not present

## 2020-10-09 ENCOUNTER — Other Ambulatory Visit: Payer: Self-pay

## 2020-10-09 ENCOUNTER — Encounter: Payer: Self-pay | Admitting: Physician Assistant

## 2020-10-09 ENCOUNTER — Ambulatory Visit (INDEPENDENT_AMBULATORY_CARE_PROVIDER_SITE_OTHER): Payer: Medicare Other | Admitting: Physician Assistant

## 2020-10-09 VITALS — BP 208/64 | HR 59 | Ht 64.0 in | Wt 102.0 lb

## 2020-10-09 DIAGNOSIS — J189 Pneumonia, unspecified organism: Secondary | ICD-10-CM

## 2020-10-09 DIAGNOSIS — I160 Hypertensive urgency: Secondary | ICD-10-CM

## 2020-10-09 DIAGNOSIS — J9621 Acute and chronic respiratory failure with hypoxia: Secondary | ICD-10-CM | POA: Diagnosis not present

## 2020-10-09 DIAGNOSIS — R918 Other nonspecific abnormal finding of lung field: Secondary | ICD-10-CM

## 2020-10-09 DIAGNOSIS — J441 Chronic obstructive pulmonary disease with (acute) exacerbation: Secondary | ICD-10-CM

## 2020-10-09 NOTE — Progress Notes (Signed)
Subjective:    Patient ID: [name], female    DOB: 09/30/1946, 74 y.o.   MRN: [id number]  HPI  Pt is a 74 yo female with Stage 5 COPD who is here to follow up from hospital admission on 09/29/2020 for COPD exacerbation, acute on chronic respiratory failure, community-acquired pneumonia, hypertension.  She was treated in the hospital with antibiotics and steroids.  She was discharged on 10/02/2020.  She finished her antibiotics and steroids yesterday.  She remains to be doing great.  While she was in the hospital on the CT they did find some bilateral opacities which were concerning for malignancy.  She has a history of lung cancer.  She was supposed to follow-up with her pulmonologist in 1 month for repeat scanning.  She continues to take Trelegy daily along with her ventilator at bedtime.  She has not had to use her rescue inhalers recently.  She is on 3 L of continuous oxygen.  Patient was rushed to get here and did not take her blood pressure medications this morning.  She denies any headache, flushing, chest pain, chest tightness, palpitations.  .. Active Ambulatory Problems    Diagnosis Date Noted  . History of lung cancer 07/28/2006  . HYPOTHYROIDISM, POSTSURGICAL 05/19/2007  . HYPERCHOLESTEROLEMIA 07/28/2006  . ANEMIA, IRON DEFICIENCY NOS 01/04/2007  . Major depressive disorder, recurrent episode ([place]) 07/28/2006  . Anxiety state 07/28/2006  . DISEASE, ISCHEMIC HEART, CHRONIC NOS 05/19/2007  . PERIPHERAL VASCULAR DISEASE, UNSPEC. 07/28/2006  . COPD with chronic bronchitis ([place]) 07/28/2006  . Hypoxemia 06/22/2013  . History of hip fracture 09/02/2013  . Insomnia 09/04/2013  . GERD (gastroesophageal reflux disease) 09/04/2013  . Sensorineural hearing loss of both ears 11/18/2013  . Systolic murmur 32/20/2542  . Episode of abnormal behavior 11/28/2013  . Word finding difficulty 11/28/2013  . Memory loss 11/28/2013  . Transient cerebral ischemic attack 03/01/2014   . Carotid artery narrowing 02/27/2014  . Clinical depression 02/27/2014  . Cerebrovascular accident, old 06/14/2013  . IFG (impaired fasting glucose) 04/12/2015  . Elevated blood pressure 10/16/2015  . Split S2 (second heart sound) 10/16/2015  . Anxiety as acute reaction to exceptional stress 12/05/2015  . Muscle spasm of back 12/05/2015  . Memory changes 12/05/2015  . Osteopenia 04/30/2016  . Chronic pain of left ankle 12/16/2016  . Left foot pain 02/17/2017  . Cavus foot, acquired 04/28/2017  . Depression with anxiety 08/01/2017  . Lung cancer ([place]) 08/01/2017  . Exertional chest pain 01/26/2018  . History of pulmonary embolus (PE) 01/29/2018  . Venous stasis of lower extremity 02/25/2018  . Primary osteoarthritis of both hands 02/25/2018  . Chronic respiratory failure ([place]) 04/02/2018  . Breast asymmetry 06/21/2018  . Headache, new daily persistent (ND[facility]) 07/28/2018  . Essential hypertension, benign 07/30/2018  . Anemia of chronic disease 12/20/2018  . Hyponatremia 05/04/2019  . Epigastric pain 05/04/2019  . Hypercapnia 06/28/2019  . RLS (restless legs syndrome) 06/28/2019  . Chronic daily headache 01/06/2020  . Dyslipidemia (high LDL; low HDL) 07/27/2020  . Right upper lobe pulmonary nodule 09/29/2020  . COPD exacerbation ([place]) 09/29/2020  . Elevated troponin 09/29/2020  . CAP (community acquired pneumonia) 09/29/2020  . Acute respiratory failure ([place]) 09/30/2020  . Protein-calorie malnutrition, severe 10/02/2020   Resolved Ambulatory Problems    Diagnosis Date Noted  . THRUSH 09/26/2010  . Abdominal bloating 03/04/2011  .  Chronic Bronchopleural fistula   . Acute respiratory distress 06/28/2019   Past Medical History:  Diagnosis Date  .  CAD (coronary artery disease)   . Cancer ([place]) 2007  . COPD (chronic obstructive pulmonary disease) ([place]) 9/10  . Dyspnea   . Hurthle cell adenoma of thyroid 2007  . Hypertension   . Iron deficiency anemia 2/08  . Peripheral  vascular disease ([place])       Review of Systems See HPI.     Objective:   Physical Exam Vitals reviewed.  Constitutional:      General: She is not in acute distress.    Appearance: Normal appearance.  HENT:     Head: Normocephalic.  Cardiovascular:     Rate and Rhythm: Normal rate and regular rhythm.     Pulses: Normal pulses.     Heart sounds: Murmur heard.    Pulmonary:     Effort: Pulmonary effort is normal.     Comments: Distant breath sounds.  No wheezing or rhonchi.  Neurological:     General: No focal deficit present.     Mental Status: She is alert and oriented to person, place, and time.  Psychiatric:        Mood and Affect: Mood normal.           Assessment & Plan:  Marland KitchenMarland KitchenToniann was seen today for hospitalization follow-up.  Diagnoses and all orders for this visit:  Acute on chronic respiratory failure with hypoxia ([place])  Asymptomatic hypertensive urgency  COPD exacerbation ([place])  Community acquired pneumonia, unspecified laterality  Opacity of lung on imaging study   Pt has finished her antibiotic and prednisone. She is doing much better. On 3L of O2 and pulse ox 100 percent. No cough. Stay on same medications. Discussed there were some lung changes seen on imaging that are suspicious for malignancy. Follow up with pulmonology in 1 month.   BP terrible. Pt did not take any medications this morning. Discussed how dangerous this is to the body. Go home and take medication and lay down. Keep monitoring BP at home. She reports 140/70-80 at home.

## 2020-10-10 ENCOUNTER — Other Ambulatory Visit: Payer: Self-pay | Admitting: Neurology

## 2020-10-10 MED ORDER — TRELEGY ELLIPTA 100-62.5-25 MCG/INH IN AEPB: 1.0000 | INHALATION_SPRAY | Freq: Every day | RESPIRATORY_TRACT | 0 refills | Status: DC

## 2020-10-10 MED ORDER — ALBUTEROL SULFATE HFA 108 (90 BASE) MCG/ACT IN AERS: 2.0000 | INHALATION_SPRAY | Freq: Four times a day (QID) | RESPIRATORY_TRACT | 3 refills | Status: DC | PRN

## 2020-10-11 ENCOUNTER — Telehealth: Payer: Self-pay | Admitting: Neurology

## 2020-10-11 NOTE — Telephone Encounter (Signed)
Ebony Hail with [facility] OT [phone]) called to let us know patient refused services this week. Moved to next week. FYI.

## 2020-10-25 ENCOUNTER — Telehealth: Payer: Self-pay

## 2020-10-25 NOTE — Telephone Encounter (Signed)
[name] from [name] called and left a message. She states she has attempted twice start of care with [facility] and she has postponed both times.

## 2020-10-26 DIAGNOSIS — J189 Pneumonia, unspecified organism: Secondary | ICD-10-CM

## 2020-10-26 DIAGNOSIS — J441 Chronic obstructive pulmonary disease with (acute) exacerbation: Secondary | ICD-10-CM

## 2020-10-26 NOTE — Telephone Encounter (Signed)
Ok to cancel referral if needed but let patient know due to her postponement.

## 2020-10-29 NOTE — Telephone Encounter (Signed)
[name],  Please cancel the referral to [facility].  See Jade's notes.

## 2020-10-31 ENCOUNTER — Ambulatory Visit: Payer: Medicare Other | Admitting: Emergency Medicine

## 2020-11-02 ENCOUNTER — Ambulatory Visit (INDEPENDENT_AMBULATORY_CARE_PROVIDER_SITE_OTHER): Payer: Medicare Other

## 2020-11-02 ENCOUNTER — Other Ambulatory Visit: Payer: Self-pay

## 2020-11-02 DIAGNOSIS — J439 Emphysema, unspecified: Secondary | ICD-10-CM | POA: Diagnosis not present

## 2020-11-02 DIAGNOSIS — I700 Atherosclerosis of aorta: Secondary | ICD-10-CM | POA: Diagnosis not present

## 2020-11-02 DIAGNOSIS — R918 Other nonspecific abnormal finding of lung field: Secondary | ICD-10-CM | POA: Diagnosis not present

## 2020-11-07 ENCOUNTER — Other Ambulatory Visit: Payer: Self-pay | Admitting: Physician Assistant

## 2020-11-07 ENCOUNTER — Other Ambulatory Visit: Payer: Self-pay

## 2020-11-07 DIAGNOSIS — F418 Other specified anxiety disorders: Secondary | ICD-10-CM

## 2020-11-07 DIAGNOSIS — E782 Mixed hyperlipidemia: Secondary | ICD-10-CM

## 2020-11-07 MED ORDER — ATORVASTATIN CALCIUM 20 MG PO TABS: 20.0000 mg | ORAL_TABLET | Freq: Every day | ORAL | 2 refills | Status: DC

## 2020-11-07 MED ORDER — ALPRAZOLAM 1 MG PO TABS
1.0000 mg | ORAL_TABLET | Freq: Every day | ORAL | 1 refills | Status: DC | PRN
Start: 2020-11-07 — End: 2021-01-27

## 2020-11-07 MED ORDER — ALPRAZOLAM 1 MG PO TABS: 1.0000 mg | ORAL_TABLET | Freq: Every day | ORAL | 1 refills | Status: DC | PRN

## 2020-11-07 NOTE — Telephone Encounter (Signed)
[name] needs a refill on xanax. The last prescription was printed and not faxed to [facility].

## 2020-11-07 NOTE — Telephone Encounter (Signed)
We did not place referral for Home Health - [facility]

## 2020-11-16 ENCOUNTER — Telehealth: Payer: Self-pay | Admitting: General Practice

## 2020-11-19 ENCOUNTER — Ambulatory Visit: Payer: Medicare Other | Admitting: Emergency Medicine

## 2020-12-07 NOTE — Telephone Encounter (Signed)
Documentation only.

## 2020-12-18 ENCOUNTER — Other Ambulatory Visit: Payer: Self-pay

## 2020-12-18 ENCOUNTER — Ambulatory Visit (INDEPENDENT_AMBULATORY_CARE_PROVIDER_SITE_OTHER): Payer: Medicare Other | Admitting: Physician Assistant

## 2020-12-18 ENCOUNTER — Encounter: Payer: Self-pay | Admitting: Physician Assistant

## 2020-12-18 VITALS — BP 160/45 | HR 62 | Ht 64.0 in | Wt 95.0 lb

## 2020-12-18 DIAGNOSIS — F331 Major depressive disorder, recurrent, moderate: Secondary | ICD-10-CM

## 2020-12-18 DIAGNOSIS — E440 Moderate protein-calorie malnutrition: Secondary | ICD-10-CM

## 2020-12-18 DIAGNOSIS — I10 Essential (primary) hypertension: Secondary | ICD-10-CM | POA: Diagnosis not present

## 2020-12-18 DIAGNOSIS — J449 Chronic obstructive pulmonary disease, unspecified: Secondary | ICD-10-CM

## 2020-12-18 DIAGNOSIS — J9611 Chronic respiratory failure with hypoxia: Secondary | ICD-10-CM

## 2020-12-18 DIAGNOSIS — R634 Abnormal weight loss: Secondary | ICD-10-CM | POA: Diagnosis not present

## 2020-12-18 DIAGNOSIS — J9612 Chronic respiratory failure with hypercapnia: Secondary | ICD-10-CM

## 2020-12-18 DIAGNOSIS — D638 Anemia in other chronic diseases classified elsewhere: Secondary | ICD-10-CM

## 2020-12-18 NOTE — Progress Notes (Signed)
Subjective:    Patient ID: [name], female    DOB: 01-21-1946, 75 y.o.   MRN: [id number]  HPI  Patient is a 75 year old female with chronic respiratory failure on 3 L of oxygen, hypertension, PVD, CAD, cardiomyopathy, depression, anxiety who presents to the clinic for medication follow-up.  Overall patient reports to be doing okay.  She lost her husband about 6 months ago and she lost her daughter about a month ago.  She feels very sad and alone.  She admits she has no desire to eat.  She is losing weight.  Her breathing seems to be maintained and okay.  She is taking her medications.  .. Active Ambulatory Problems    Diagnosis Date Noted  . History of lung cancer 07/28/2006  . HYPOTHYROIDISM, POSTSURGICAL 05/19/2007  . HYPERCHOLESTEROLEMIA 07/28/2006  . ANEMIA, IRON DEFICIENCY NOS 01/04/2007  . Major depressive disorder, recurrent episode ([place]) 07/28/2006  . Anxiety state 07/28/2006  . DISEASE, ISCHEMIC HEART, CHRONIC NOS 05/19/2007  . PERIPHERAL VASCULAR DISEASE, UNSPEC. 07/28/2006  . COPD with chronic bronchitis ([place]) 07/28/2006  . Hypoxemia 06/22/2013  . History of hip fracture 09/02/2013  . Insomnia 09/04/2013  . GERD (gastroesophageal reflux disease) 09/04/2013  . Sensorineural hearing loss of both ears 11/18/2013  . Systolic murmur 78/58/8502  . Episode of abnormal behavior 11/28/2013  . Word finding difficulty 11/28/2013  . Memory loss 11/28/2013  . Transient cerebral ischemic attack 03/01/2014  . Carotid artery narrowing 02/27/2014  . Clinical depression 02/27/2014  . Cerebrovascular accident, old 06/14/2013  . IFG (impaired fasting glucose) 04/12/2015  . Elevated blood pressure 10/16/2015  . Split S2 (second heart sound) 10/16/2015  . Anxiety as acute reaction to exceptional stress 12/05/2015  . Muscle spasm of back 12/05/2015  . Memory changes 12/05/2015  . Osteopenia 04/30/2016  . Chronic pain of left ankle 12/16/2016  . Left foot pain 02/17/2017   . Cavus foot, acquired 04/28/2017  . Depression with anxiety 08/01/2017  . Lung cancer ([place]) 08/01/2017  . Exertional chest pain 01/26/2018  . History of pulmonary embolus (PE) 01/29/2018  . Venous stasis of lower extremity 02/25/2018  . Primary osteoarthritis of both hands 02/25/2018  . Chronic respiratory failure ([place]) 04/02/2018  . Breast asymmetry 06/21/2018  . Headache, new daily persistent (NDPH) 07/28/2018  . Essential hypertension, benign 07/30/2018  . Anemia of chronic disease 12/20/2018  . Hyponatremia 05/04/2019  . Epigastric pain 05/04/2019  . Hypercapnia 06/28/2019  . RLS (restless legs syndrome) 06/28/2019  . Chronic daily headache 01/06/2020  . Dyslipidemia (high LDL; low HDL) 07/27/2020  . Right upper lobe pulmonary nodule 09/29/2020  . COPD exacerbation ([place]) 09/29/2020  . Elevated troponin 09/29/2020  . CAP (community acquired pneumonia) 09/29/2020  . Acute respiratory failure ([place]) 09/30/2020  . Protein-calorie malnutrition, severe 10/02/2020  . Malnutrition of moderate degree ([place]) 12/19/2020  . Unintended weight loss 12/24/2020   Resolved Ambulatory Problems    Diagnosis Date Noted  . THRUSH 09/26/2010  . Abdominal bloating 03/04/2011  .  Chronic Bronchopleural fistula   . Acute respiratory distress 06/28/2019   Past Medical History:  Diagnosis Date  . CAD (coronary artery disease)   . Cancer ([place]) 2007  . COPD (chronic obstructive pulmonary disease) ([place]) 9/10  . Dyspnea   . Hurthle cell adenoma of thyroid 2007  . Hypertension   . Iron deficiency anemia 2/08  . Peripheral vascular disease ([place])      Review of Systems    see HPI.  Objective:  Physical Exam Vitals reviewed.  Constitutional:      Comments: frail appearance.   HENT:     Head: Normocephalic.  Neck:     Vascular: No carotid bruit.  Cardiovascular:     Rate and Rhythm: Normal rate and regular rhythm.     Pulses: Normal pulses.     Heart sounds: Murmur heard.     Pulmonary:     Comments: Labored breathing.  No wheezing or rhonchi.  3L continuous O2.  Musculoskeletal:     Cervical back: Normal range of motion.     Right lower leg: No edema.     Left lower leg: No edema.  Lymphadenopathy:     Cervical: No cervical adenopathy.  Neurological:     General: No focal deficit present.     Mental Status: She is alert and oriented to person, place, and time.  Psychiatric:     Comments: Flat affect.     .. Depression screen [facility] 2/9 12/18/2020 03/20/2020 12/30/2019 09/20/2019 04/19/2019  Decreased Interest 3 3 3 3  0  Down, Depressed, Hopeless 2 3 3 2 1   PHQ - 2 Score 5 6 6 5 1   Altered sleeping 0 1 0 3 -  Tired, decreased energy 0 3 3 3  -  Change in appetite 3 3 3 1  -  Feeling bad or failure about yourself  2 3 3 3  -  Trouble concentrating 1 2 3  0 -  Moving slowly or fidgety/restless 3 3 3 3  -  Suicidal thoughts 0 0 0 0 -  PHQ-9 Score 14 21 21 18  -  Difficult doing work/chores Not difficult at all Very difficult - Very difficult -  Some recent data might be hidden   .Marland Kitchen GAD 7 : Generalized Anxiety Score 12/18/2020 03/20/2020 12/30/2019 09/20/2019  Nervous, Anxious, on Edge 3 3 3 3   Control/stop worrying 3 3 3 3   Worry too much - different things 3 3 3 3   Trouble relaxing 1 3 2 1   Restless 1 2 3 1   Easily annoyed or irritable 3 3 3 3   Afraid - awful might happen 3 3 3  0  Total GAD 7 Score 17 20 20 14   Anxiety Difficulty Not difficult at all Very difficult Very difficult Very difficult          Assessment & Plan:  Marland KitchenMarland KitchenTayla was seen today for follow-up.  Diagnoses and all orders for this visit:  Unintended weight loss  Essential hypertension, benign  COPD with chronic bronchitis (HCC)  Chronic respiratory failure with hypoxia and hypercapnia (HCC)  Anemia of chronic disease  Moderate episode of recurrent major depressive disorder (HCC)  Malnutrition of moderate degree ([place])   Biggest concern today is her weight loss.  Discussed  proper nutrition.  Gave her case of boost/Ensure to drink 3 times a day as needed.  Try to make sure she is eating 3 meals a day with snacks.  Her PHQ-9 and GAD-7 numbers are actually little better than normal.  She has had some significant setbacks with deaths in her family in the last 6 months.  Discussed counseling.  Patient declined today.  Overall lungs are controlled.  Continue on oxygen and current management.  Follow-up weight in 3 months.

## 2020-12-18 NOTE — Patient Instructions (Signed)
Make sure eating 3 meals and 2 snacks a day.  Drink boost/ensure if not hungry.

## 2020-12-19 DIAGNOSIS — E440 Moderate protein-calorie malnutrition: Secondary | ICD-10-CM | POA: Insufficient documentation

## 2020-12-24 DIAGNOSIS — R634 Abnormal weight loss: Secondary | ICD-10-CM | POA: Insufficient documentation

## 2020-12-28 ENCOUNTER — Telehealth: Payer: Self-pay | Admitting: Neurology

## 2020-12-28 NOTE — Telephone Encounter (Signed)
[name] with [facility] called and left vm stating they have orders from [facility] to do home health PT/OT. Patient is set to be released today and orders say that PCP with sign/follow home health orders. Okay?  [phone].

## 2020-12-28 NOTE — Telephone Encounter (Signed)
Called and spoke with Lavella Lemons and gave okay to sign orders.

## 2020-12-28 NOTE — Telephone Encounter (Signed)
Ok for orders? 

## 2021-01-02 ENCOUNTER — Other Ambulatory Visit: Payer: Self-pay

## 2021-01-02 ENCOUNTER — Encounter: Payer: Self-pay | Admitting: Physician Assistant

## 2021-01-02 ENCOUNTER — Ambulatory Visit (INDEPENDENT_AMBULATORY_CARE_PROVIDER_SITE_OTHER): Payer: Medicare Other

## 2021-01-02 ENCOUNTER — Ambulatory Visit (INDEPENDENT_AMBULATORY_CARE_PROVIDER_SITE_OTHER): Payer: Medicare Other | Admitting: Physician Assistant

## 2021-01-02 VITALS — BP 137/42 | HR 57

## 2021-01-02 DIAGNOSIS — E440 Moderate protein-calorie malnutrition: Secondary | ICD-10-CM

## 2021-01-02 DIAGNOSIS — J9611 Chronic respiratory failure with hypoxia: Secondary | ICD-10-CM

## 2021-01-02 DIAGNOSIS — I10 Essential (primary) hypertension: Secondary | ICD-10-CM | POA: Diagnosis not present

## 2021-01-02 DIAGNOSIS — J9601 Acute respiratory failure with hypoxia: Secondary | ICD-10-CM | POA: Diagnosis not present

## 2021-01-02 DIAGNOSIS — R636 Underweight: Secondary | ICD-10-CM

## 2021-01-02 DIAGNOSIS — J189 Pneumonia, unspecified organism: Secondary | ICD-10-CM

## 2021-01-02 DIAGNOSIS — J9612 Chronic respiratory failure with hypercapnia: Secondary | ICD-10-CM

## 2021-01-02 DIAGNOSIS — J449 Chronic obstructive pulmonary disease, unspecified: Secondary | ICD-10-CM

## 2021-01-02 DIAGNOSIS — E876 Hypokalemia: Secondary | ICD-10-CM

## 2021-01-02 MED ORDER — IPRATROPIUM-ALBUTEROL 0.5-2.5 (3) MG/3ML IN SOLN: 3.0000 mL | RESPIRATORY_TRACT | 1 refills | Status: DC | PRN

## 2021-01-02 MED ORDER — LABETALOL HCL 200 MG PO TABS: 400.0000 mg | ORAL_TABLET | Freq: Two times a day (BID) | ORAL | 1 refills | Status: DC

## 2021-01-02 NOTE — Patient Instructions (Signed)
Will get labs tomorrow.  Get CXR today.  Decrease O2 to 3L at home.  Continue to increase oxygen.

## 2021-01-02 NOTE — Progress Notes (Signed)
Subjective:    Patient ID: [name], female    DOB: 10-May-1946, 75 y.o.   MRN: [id number]  HPI  Pt is a 75 yo underweight female with severe COPD, chronic respiratory failure, HTN, hypothyroidism, MDD, anemia, hx of PE who presents to the clinic to follow up after hospital follow up for Acute respiratory failure and Multifocal pneumonia. She is accompanied by granddaughter who lives with her.  She was admitted on 3/8 and discharged 3/11. She has finished avelox but had lots of side effects with medication. She did not like it. Her CO2 was 135 at admission. She was discharged at venous pCO2 at 74. Suspected home levels of O2 too high. Pt was on 4L at home. She is on Trilogy bipap machine at night. She is not using duoneb. She is taking trelegy inhaler daily. She has a nurse coming to draw blood twice a week. 3/12 potassium 2.9 and started on potassium.   She is drinking 1-2 ensure a day but does not have much of appetite. Granddaughter says she does not want to eat any protein.   .. Active Ambulatory Problems    Diagnosis Date Noted  . History of lung cancer 07/28/2006  . HYPOTHYROIDISM, POSTSURGICAL 05/19/2007  . HYPERCHOLESTEROLEMIA 07/28/2006  . ANEMIA, IRON DEFICIENCY NOS 01/04/2007  . Major depressive disorder, recurrent episode ([place]) 07/28/2006  . Anxiety state 07/28/2006  . DISEASE, ISCHEMIC HEART, CHRONIC NOS 05/19/2007  . PERIPHERAL VASCULAR DISEASE, UNSPEC. 07/28/2006  . COPD with chronic bronchitis ([place]) 07/28/2006  . Hypoxemia 06/22/2013  . History of hip fracture 09/02/2013  . Insomnia 09/04/2013  . GERD (gastroesophageal reflux disease) 09/04/2013  . Sensorineural hearing loss of both ears 11/18/2013  . Systolic murmur 48/54/6270  . Episode of abnormal behavior 11/28/2013  . Word finding difficulty 11/28/2013  . Memory loss 11/28/2013  . Transient cerebral ischemic attack 03/01/2014  . Carotid artery narrowing 02/27/2014  . Clinical depression 02/27/2014  .  Cerebrovascular accident, old 06/14/2013  . IFG (impaired fasting glucose) 04/12/2015  . Elevated blood pressure 10/16/2015  . Split S2 (second heart sound) 10/16/2015  . Anxiety as acute reaction to exceptional stress 12/05/2015  . Muscle spasm of back 12/05/2015  . Memory changes 12/05/2015  . Osteopenia 04/30/2016  . Chronic pain of left ankle 12/16/2016  . Left foot pain 02/17/2017  . Cavus foot, acquired 04/28/2017  . Depression with anxiety 08/01/2017  . Lung cancer ([place]) 08/01/2017  . Exertional chest pain 01/26/2018  . History of pulmonary embolus (PE) 01/29/2018  . Venous stasis of lower extremity 02/25/2018  . Primary osteoarthritis of both hands 02/25/2018  . Chronic respiratory failure ([place]) 04/02/2018  . Breast asymmetry 06/21/2018  . Headache, new daily persistent (NDPH) 07/28/2018  . Essential hypertension, benign 07/30/2018  . Anemia of chronic disease 12/20/2018  . Hyponatremia 05/04/2019  . Epigastric pain 05/04/2019  . Hypercapnia 06/28/2019  . RLS (restless legs syndrome) 06/28/2019  . Chronic daily headache 01/06/2020  . Dyslipidemia (high LDL; low HDL) 07/27/2020  . Right upper lobe pulmonary nodule 09/29/2020  . COPD exacerbation ([place]) 09/29/2020  . Elevated troponin 09/29/2020  . CAP (community acquired pneumonia) 09/29/2020  . Acute respiratory failure ([place]) 09/30/2020  . Protein-calorie malnutrition, severe 10/02/2020  . Malnutrition of moderate degree ([place]) 12/19/2020  . Unintended weight loss 12/24/2020  . Underweight due to inadequate caloric intake 01/04/2021  . Hypokalemia 01/04/2021   Resolved Ambulatory Problems    Diagnosis Date Noted  . THRUSH 09/26/2010  . Abdominal  bloating 03/04/2011  .  Chronic Bronchopleural fistula   . Acute respiratory distress 06/28/2019   Past Medical History:  Diagnosis Date  . CAD (coronary artery disease)   . Cancer ([place]) 2007  . COPD (chronic obstructive pulmonary disease) ([place]) 9/10  . Dyspnea   .  Hurthle cell adenoma of thyroid 2007  . Hypertension   . Iron deficiency anemia 2/08  . Peripheral vascular disease ([place])        Review of Systems See HPI.     Objective:   Physical Exam Vitals reviewed.  Constitutional:      Appearance: She is ill-appearing.     Comments: Frail and weak.   HENT:     Head: Normocephalic.  Cardiovascular:     Rate and Rhythm: Normal rate and regular rhythm.     Pulses: Normal pulses.     Heart sounds: Murmur heard.    Pulmonary:     Effort: Pulmonary effort is normal.  Musculoskeletal:     Right lower leg: No edema.     Left lower leg: No edema.  Neurological:     General: No focal deficit present.     Mental Status: She is alert and oriented to person, place, and time.  Psychiatric:        Mood and Affect: Mood normal.           Assessment & Plan:  Marland KitchenMarland KitchenIcess was seen today for hospitalization follow-up.  Diagnoses and all orders for this visit:  Acute respiratory failure with hypoxia ([place]) -     Ambulatory referral to Pulmonology  Multifocal pneumonia -     DG Chest 2 View  COPD with chronic bronchitis -     ipratropium-albuterol (DUONEB) 0.5-2.5 (3) MG/3ML SOLN; Take 3 mLs by nebulization every 4 (four) hours as needed. -     Ambulatory referral to Pulmonology  Uncontrolled stage 2 hypertension -     labetalol (NORMODYNE) 200 MG tablet; Take 2 tablets (400 mg total) by mouth 2 (two) times daily.  Essential hypertension, benign  Chronic respiratory failure with hypoxia and hypercapnia (HCC) -     Ambulatory referral to Pulmonology  Malnutrition of moderate degree (HCC)  Underweight due to inadequate caloric intake  Hypokalemia   Pt is feeling better.  She is getting labs at home. Granddaughter will make sure we get a copy.  We are following her CO2 which was way up at hospital admission in the 100's.   Turn down home O2 to 3L and then 2.5 is pulse ox staying above 94 percent. Discussed over oxygen  supplementation.  Finished avelox.  Repeat cXR. Continue on trelegy.  duoneb refilled and encouraged to do twice a day.  Went over medication list.  She has not wanted to go to pulmonology. Discussed importance of consult.  No recent pulmonary function testing.  Pt agreed to referral.   Pt has gained weight from hospital but still underweight. Discussed importance of protein and well nourished diet.  Drink ensure at least 2 a day with meals.   Follow up in 4 weeks weight and breathing.

## 2021-01-04 DIAGNOSIS — E876 Hypokalemia: Secondary | ICD-10-CM | POA: Insufficient documentation

## 2021-01-04 DIAGNOSIS — R636 Underweight: Secondary | ICD-10-CM | POA: Insufficient documentation

## 2021-01-04 NOTE — Progress Notes (Signed)
Stable CXR. Chronic changes noted.

## 2021-01-16 ENCOUNTER — Inpatient Hospital Stay: Payer: Medicare Other | Admitting: Physician Assistant

## 2021-01-18 ENCOUNTER — Ambulatory Visit (INDEPENDENT_AMBULATORY_CARE_PROVIDER_SITE_OTHER): Payer: Medicare Other | Admitting: Family Medicine

## 2021-01-18 ENCOUNTER — Other Ambulatory Visit: Payer: Self-pay

## 2021-01-18 ENCOUNTER — Encounter: Payer: Self-pay | Admitting: Family Medicine

## 2021-01-18 VITALS — BP 120/56 | HR 67 | Temp 98.4°F | Wt 91.1 lb

## 2021-01-18 DIAGNOSIS — J449 Chronic obstructive pulmonary disease, unspecified: Secondary | ICD-10-CM | POA: Diagnosis not present

## 2021-01-18 DIAGNOSIS — R0602 Shortness of breath: Secondary | ICD-10-CM | POA: Diagnosis not present

## 2021-01-18 DIAGNOSIS — J4489 Other specified chronic obstructive pulmonary disease: Secondary | ICD-10-CM

## 2021-01-18 LAB — CBC
HCT: 30.1 % — ABNORMAL LOW (ref 35.0–45.0)
Hemoglobin: 9.9 g/dL — ABNORMAL LOW (ref 11.7–15.5)
MCH: 30.1 pg (ref 27.0–33.0)
MCHC: 32.9 g/dL (ref 32.0–36.0)
MCV: 91.5 fL (ref 80.0–100.0)
MPV: 10.8 fL (ref 7.5–12.5)
Platelets: 228 10*3/uL (ref 140–400)
RBC: 3.29 10*6/uL — ABNORMAL LOW (ref 3.80–5.10)
RDW: 13 % (ref 11.0–15.0)
WBC: 8.3 10*3/uL (ref 3.8–10.8)

## 2021-01-18 LAB — BASIC METABOLIC PANEL WITH GFR
BUN/Creatinine Ratio: 33 (calc) — ABNORMAL HIGH (ref 6–22)
BUN: 17 mg/dL (ref 7–25)
CO2: 40 mmol/L — ABNORMAL HIGH (ref 20–32)
Calcium: 9.1 mg/dL (ref 8.6–10.4)
Chloride: 92 mmol/L — ABNORMAL LOW (ref 98–110)
Creat: 0.51 mg/dL — ABNORMAL LOW (ref 0.60–0.93)
GFR, Est African American: 111 mL/min/{1.73_m2} (ref >=60)
GFR, Est Non African American: 95 mL/min/{1.73_m2} (ref >=60)
Glucose, Bld: 190 mg/dL — ABNORMAL HIGH (ref 65–139)
Potassium: 3.5 mmol/L (ref 3.5–5.3)
Sodium: 137 mmol/L (ref 135–146)

## 2021-01-18 MED ORDER — PREDNISONE 20 MG PO TABS: 40.0000 mg | ORAL_TABLET | Freq: Every day | ORAL | 0 refills | Status: AC

## 2021-01-18 MED ORDER — IPRATROPIUM-ALBUTEROL 0.5-2.5 (3) MG/3ML IN SOLN: 3.0000 mL | RESPIRATORY_TRACT | 1 refills | Status: DC | PRN

## 2021-01-18 NOTE — Patient Instructions (Signed)
Goal pulse ox: 88-92%, can wean as tolerated Labs today - we will update you with results 5 days of steroids PRN nebulizer treatments Call if you haven't heard from Pulmonology mid-next week   Chronic Obstructive Pulmonary Disease  Chronic obstructive pulmonary disease (COPD) is a long-term (chronic) lung problem. When you have COPD, it is hard for air to get in and out of your lungs. Usually the condition gets worse over time, and your lungs will never return to normal. There are things you can do to keep yourself as healthy as possible. What are the causes?  Smoking. This is the most common cause.  Certain genes passed from parent to child (inherited). What increases the risk?  Being exposed to secondhand smoke from cigarettes, pipes, or cigars.  Being exposed to chemicals and other irritants, such as fumes and dust in the work environment.  Having chronic lung conditions or infections. What are the signs or symptoms?  Shortness of breath, especially during physical activity.  A long-term cough with a large amount of thick mucus. Sometimes, the cough may not have any mucus (dry cough).  Wheezing.  Breathing quickly.  Skin that looks gray or blue, especially in the fingers, toes, or lips.  Feeling tired (fatigue).  Weight loss.  Chest tightness.  Having infections often.  Episodes when breathing symptoms become much worse (exacerbations). At the later stages of this disease, you may have swelling in the ankles, feet, or legs. How is this treated?  Taking medicines.  Quitting smoking, if you smoke.  Rehabilitation. This includes steps to make your body work better. It may involve a team of specialists.  Doing exercises.  Making changes to your diet.  Using oxygen.  Lung surgery.  Lung transplant.  Comfort measures (palliative care). Follow these instructions at home: Medicines  Take over-the-counter and prescription medicines only as told by your  doctor.  Talk to your doctor before taking any cough or allergy medicines. You may need to avoid medicines that cause your lungs to be dry. Lifestyle  If you smoke, stop smoking. Smoking makes the problem worse.  Do not smoke or use any products that contain nicotine or tobacco. If you need help quitting, ask your doctor.  Avoid being around things that make your breathing worse. This may include smoke, chemicals, and fumes.  Stay active, but remember to rest as well.  Learn and use tips on how to manage stress and control your breathing.  Make sure you get enough sleep. Most adults need at least 7 hours of sleep every night.  Eat healthy foods. Eat smaller meals more often. Rest before meals. Controlled breathing Learn and use tips on how to control your breathing as told by your doctor. Try:  Breathing in (inhaling) through your nose for 1 second. Then, pucker your lips and breath out (exhale) through your lips for 2 seconds.  Putting one hand on your belly (abdomen). Breathe in slowly through your nose for 1 second. Your hand on your belly should move out. Pucker your lips and breathe out slowly through your lips. Your hand on your belly should move in as you breathe out.   Controlled coughing Learn and use controlled coughing to clear mucus from your lungs. Follow these steps: 1. Lean your head a little forward. 2. Breathe in deeply. 3. Try to hold your breath for 3 seconds. 4. Keep your mouth slightly open while coughing 2 times. 5. Spit any mucus out into a tissue. 6. Rest and do  the steps again 1 or 2 times as needed. General instructions  Make sure you get all the shots (vaccines) that your doctor recommends. Ask your doctor about a flu shot and a pneumonia shot.  Use oxygen therapy and pulmonary rehabilitation if told by your doctor. If you need home oxygen therapy, ask your doctor if you should buy a tool to measure your oxygen level (oximeter).  Make a COPD action  plan with your doctor. This helps you to know what to do if you feel worse than usual.  Manage any other conditions you have as told by your doctor.  Avoid going outside when it is very hot, cold, or humid.  Avoid people who have a sickness you can catch (contagious).  Keep all follow-up visits. Contact a doctor if:  You cough up more mucus than usual.  There is a change in the color or thickness of the mucus.  It is harder to breathe than usual.  Your breathing is faster than usual.  You have trouble sleeping.  You need to use your medicines more often than usual.  You have trouble doing your normal activities such as getting dressed or walking around the house. Get help right away if:  You have shortness of breath while resting.  You have shortness of breath that stops you from: ? Being able to talk. ? Doing normal activities.  Your chest hurts for longer than 5 minutes.  Your skin color is more blue than usual.  Your pulse oximeter shows that you have low oxygen for longer than 5 minutes.  You have a fever.  You feel too tired to breathe normally. These symptoms may represent a serious problem that is an emergency. Do not wait to see if the symptoms will go away. Get medical help right away. Call your local emergency services (911 in the [place] Do not drive yourself to the hospital. Summary  Chronic obstructive pulmonary disease (COPD) is a long-term lung problem.  The way your lungs work will never return to normal. Usually the condition gets worse over time. There are things you can do to keep yourself as healthy as possible.  Take over-the-counter and prescription medicines only as told by your doctor.  If you smoke, stop. Smoking makes the problem worse. This information is not intended to replace advice given to you by your health care provider. Make sure you discuss any questions you have with your health care provider. Document Revised: 08/14/2020  Document Reviewed: 08/14/2020 Elsevier Patient Education  2021 [employer].

## 2021-01-18 NOTE — Progress Notes (Signed)
MyChart message sent: Thankfully your labs have stayed at your baseline when compared to the past several lab draws. Let's continue treating this as COPD exacerbation with oral steroids and breathing treatments. Try to wean down on your oxygen with a goal pulse ox of 88-93%. If your symptoms worsen or you begin with any new symptoms including but not limited to chest pain, pain with breathing, fevers, productive cough, lethargy, extreme fatigue, confusion, blood in sputum, urine, or stool then please go the ED or urgent care this weekend. Let us know if you are not starting to improve over the weekend. I hope you feel better soon!

## 2021-01-18 NOTE — Progress Notes (Signed)
Acute Office Visit  Subjective:    Patient ID: [name], female    DOB: 03/07/1946, 75 y.o.   MRN: [id number]  No chief complaint on file.   HPI Patient is in today for increased shortness of breath.  For the past 2 weeks patient reports an increase in shortness of breath compared to her baseline. She reports having trouble catching her breath with exertion and at rest, feeling dizzy and lightheaded at times. Reports she has had to use her albuterol inhaler every 2-3 hours for the past 2 weeks because of the shortness of breath. She denies chest pain, fevers, GI symptoms, cough, lethargy, wheezing. This morning she woke up confused and was yelling at granddaughter to put her O2 back on, but it was already in place. Reports this is the first episode of confusion.   She has been wearing nasal canula at 2.5L and CPAP at night. O2 saturation has stayed in the upper 90's-100%. She has not been using her duoneb treatments recently because she ran out. Requesting refill today.  Granddaughter is present for visit. States she has been trying to wean down the O2 because her sats have been so high and she is aware of retaining CO2, but patient has been refusing. - Educated today.   Past Medical History:  Diagnosis Date  . 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 CAD (coronary artery disease)    20% stenosis on cath - no intervention required (W-S cards), negative nuclear stress test 11/07  . Cancer [facility]) 2007   lung (Dr. Earlie Server and Dr. Arlyce Dice)  . COPD (chronic obstructive pulmonary disease) ([place]) 9/10   golds stage III, FeV1 39%  . Dyspnea   . Hurthle cell adenoma of thyroid 2007  . Hypertension   . Iron deficiency anemia 2/08   s/p 2 unit transfusion  . Lung cancer ([place])   . Peripheral vascular disease ([place])   . RLS (restless legs syndrome)     Past Surgical History:  Procedure Laterality Date  . APPENDECTOMY  18  . CHOLECYSTECTOMY  18  . heart cartherization    . LUL  2005    LUL wedge resection/VATS  . LUL  4/08   LUL lobectomy for cystic cavity and Candida, no cancer seen  . THYROIDECTOMY, PARTIAL  2007   Dr. Arlyce Dice    Family History  Problem Relation Age of Onset  . Cancer Mother        Larynx & Endometrial cancer  . Cancer Father        laryngeal cancer  . Hypertension Father   . Diabetes Sister   . Multiple sclerosis Sister   . Cancer Sister 21       breast    Social History   Socioeconomic History  . Marital status: Married    Spouse name: Juanda Crumble  . Number of children: 4  . Years of education: 49  . Highest education level: 11th grade  Occupational History  . 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 Smoking status: Former Smoker    Packs/day: 1.50    Years: 30.00    Pack years: 45.00    Types: Cigarettes    Quit date: 10/20/2004    Years since quitting: 16.2  . Smokeless tobacco: Never Used  Vaping Use  . Vaping Use: Never used  Substance and Sexual Activity  . Alcohol use: No  . Drug use: No  . Sexual activity: Not Currently  Other  Topics Concern  . Not on file  Social History Narrative   Watch Tv   Works on the [employer]   Drinks 2 cups of coffee daily   Social Determinants of Radio broadcast assistant Strain: Not on file  Food Insecurity: Not on file  Transportation Needs: No Transportation Needs  . Lack of Transportation (Medical): No  . Lack of Transportation (Non-Medical): No  Physical Activity: Not on file  Stress: Not on file  Social Connections: Not on file  Intimate Partner Violence: Not on file    Outpatient Medications Prior to Visit  Medication Sig Dispense Refill  . albuterol (VENTOLIN HFA) 108 (90 Base) MCG/ACT inhaler Inhale 2 puffs into the lungs every 6 (six) hours as needed for wheezing or shortness of breath. 3 each 3  . ALPRAZolam (XANAX) 1 MG tablet Take 1 tablet (1 mg total) by mouth daily as needed for anxiety. 90 tablet 1  . aspirin EC 81 MG  tablet Take 81 mg by mouth daily.    Marland Kitchen atorvastatin (LIPITOR) 20 MG tablet Take 1 tablet (20 mg total) by mouth at bedtime. Labs for further refills 90 tablet 2  . cloNIDine (CATAPRES - DOSED IN MG/24 HR) 0.3 mg/24hr patch Place 1 patch (0.3 mg total) onto the skin once a week. 12 patch 3  . Fluticasone-Umeclidin-Vilant (TRELEGY ELLIPTA) 100-62.5-25 MCG/INH AEPB Inhale 1 puff into the lungs daily. 180 each 0  . labetalol (NORMODYNE) 200 MG tablet Take 2 tablets (400 mg total) by mouth 2 (two) times daily. 360 tablet 1  . levothyroxine (SYNTHROID) 50 MCG tablet take 1 tablet by mouth once daily 90 tablet 3  . QUEtiapine (SEROQUEL) 100 MG tablet Take 1 tablet (100 mg total) by mouth at bedtime. 90 tablet 1  . valsartan (DIOVAN) 320 MG tablet Take 1 tablet (320 mg total) by mouth daily. 90 tablet 3  . vortioxetine HBr (TRINTELLIX) 20 MG TABS tablet Take 1 tablet (20 mg total) by mouth daily. 90 tablet 1  . ipratropium-albuterol (DUONEB) 0.5-2.5 (3) MG/3ML SOLN Take 3 mLs by nebulization every 4 (four) hours as needed. 360 mL 1   No facility-administered medications prior to visit.    Allergies  Allergen Reactions  . Avelox [Moxifloxacin]     GI upset/dizzy  . Desvenlafaxine Succinate Er Other (See Comments)    shaking  . Lisinopril-Hydrochlorothiazide     REACTION: N/V cough  . Nitrofurantoin Monohyd Macro   . Norvasc [Amlodipine]     Lower extremity edema  . Penicillins     REACTION: Hives  . Zoloft [Sertraline Hcl] Other (See Comments)    "too many side effects"    Review of Systems All review of systems negative except what is listed in the HPI     Objective:    Physical Exam Constitutional:      Appearance: Normal appearance.  Cardiovascular:     Rate and Rhythm: Normal rate and regular rhythm.     Heart sounds: Normal heart sounds.  Pulmonary:     Effort: No respiratory distress.     Breath sounds: No stridor. No wheezing, rhonchi or rales.     Comments: Diminished,  shallow breaths Chest:     Chest wall: No tenderness.  Skin:    General: Skin is warm and dry.     Coloration: Skin is pale.  Neurological:     General: No focal deficit present.     Mental Status: She is alert and oriented to  person, place, and time. Mental status is at baseline.  Psychiatric:        Mood and Affect: Mood normal.        Behavior: Behavior normal.        Thought Content: Thought content normal.        Judgment: Judgment normal.     BP (!) 120/56   Pulse 67   Temp 98.4 F (36.9 C)   Wt 91 lb 1.6 oz (41.3 kg)   SpO2 100% Comment: 2.5L O2 [place]  BMI 15.64 kg/m  Wt Readings from Last 3 Encounters:  01/18/21 91 lb 1.6 oz (41.3 kg)  12/18/20 95 lb (43.1 kg)  10/09/20 102 lb (46.3 kg)    Health Maintenance Due  Topic Date Due  . COVID-19 Vaccine (3 - Moderna risk 4-dose series) 02/28/2020    There are no preventive care reminders to display for this patient.   Lab Results  Component Value Date   TSH 1.54 08/28/2020   Lab Results  Component Value Date   WBC 9.6 10/02/2020   HGB 8.8 (L) 10/02/2020   HCT 27.9 (L) 10/02/2020   MCV 93.6 10/02/2020   PLT 340 10/02/2020   Lab Results  Component Value Date   NA 136 10/02/2020   K 3.6 10/02/2020   CO2 40 (H) 10/02/2020   GLUCOSE 103 (H) 10/02/2020   BUN 27 (H) 10/02/2020   CREATININE 0.49 10/02/2020   BILITOT 0.5 08/28/2020   ALKPHOS 101 04/28/2017   AST 14 08/28/2020   ALT 7 08/28/2020   PROT 5.8 (L) 08/28/2020   ALBUMIN 4.0 04/28/2017   CALCIUM 8.6 (L) 10/02/2020   ANIONGAP 9 10/02/2020   Lab Results  Component Value Date   CHOL 134 08/28/2020   Lab Results  Component Value Date   HDL 63 08/28/2020   Lab Results  Component Value Date   LDLCALC 59 08/28/2020   Lab Results  Component Value Date   TRIG 50 08/28/2020   Lab Results  Component Value Date   CHOLHDL 2.1 08/28/2020   Lab Results  Component Value Date   HGBA1C 5.4 08/28/2020       Assessment & Plan:   1. COPD with  chronic bronchitis 2. Shortness of breath  Patient with COPD exacerbation. No signs of infection at this time. Will hold off on antibiotics and CXR at this time; last CXR 3/16 nothing acute. Would like to give her a prednisone burst and refill Duoneb for as needed use - recommend utilizing this as ordered q4h PRN. Also would like to recheck CO2 as this has been high since her hospitalization a few weeks ago. Last checked a few weeks ago and was 39. Also would like to check Hgb as it was slightly low at that time and patient is pale and short of breath. No signs of active bleed.  She continues to lose weight (down 4 pounds in the past month). States she is trying to eat a little bit at all meal times and is able to drink 1 ensure a day. Encouraged her to keep trying to increase calorie and protein intake. CT 11/02/20 showed some pulmonary nodules and recommend imaging again in 1 year.  Pulmonology referral was placed by PCP a few weeks ago. Patient has not heard anything yet. Will ask our referral coordinator to look into it.   Patient and daughter educated on signs and symptoms that would require urgent evaluation. Will update them with lab results.  - ipratropium-albuterol (DUONEB)  0.5-2.5 (3) MG/3ML SOLN; Take 3 mLs by nebulization every 4 (four) hours as needed.  Dispense: 360 mL; Refill: 1 - CBC - BASIC METABOLIC PANEL WITH GFR - predniSONE (DELTASONE) 20 MG tablet; Take 2 tablets (40 mg total) by mouth daily with breakfast for 5 days.  Dispense: 10 tablet; Refill: 0  Follow-up if symptoms worsen or fail to improve.  Terrilyn Saver, NP

## 2021-01-23 ENCOUNTER — Emergency Department (HOSPITAL_COMMUNITY): Payer: Medicare Other

## 2021-01-23 ENCOUNTER — Encounter (HOSPITAL_COMMUNITY): Payer: Self-pay

## 2021-01-23 ENCOUNTER — Inpatient Hospital Stay (HOSPITAL_COMMUNITY)
Admission: EM | Admit: 2021-01-23 | Discharge: 2021-01-27 | DRG: 312 | Disposition: A | Discharge status: Home / Self Care | Payer: Medicare Other | Attending: Family Medicine | Admitting: Family Medicine

## 2021-01-23 ENCOUNTER — Other Ambulatory Visit: Payer: Self-pay

## 2021-01-23 ENCOUNTER — Encounter: Payer: Medicare Other | Admitting: Primary Care

## 2021-01-23 ENCOUNTER — Observation Stay (HOSPITAL_COMMUNITY): Payer: Medicare Other

## 2021-01-23 DIAGNOSIS — E890 Postprocedural hypothyroidism: Secondary | ICD-10-CM | POA: Diagnosis present

## 2021-01-23 DIAGNOSIS — Z9981 Dependence on supplemental oxygen: Secondary | ICD-10-CM

## 2021-01-23 DIAGNOSIS — Z20822 Contact with and (suspected) exposure to covid-19: Secondary | ICD-10-CM | POA: Diagnosis present

## 2021-01-23 DIAGNOSIS — Z87891 Personal history of nicotine dependence: Secondary | ICD-10-CM

## 2021-01-23 DIAGNOSIS — J449 Chronic obstructive pulmonary disease, unspecified: Secondary | ICD-10-CM | POA: Diagnosis present

## 2021-01-23 DIAGNOSIS — E440 Moderate protein-calorie malnutrition: Secondary | ICD-10-CM | POA: Diagnosis present

## 2021-01-23 DIAGNOSIS — Z681 Body mass index (BMI) 19 or less, adult: Secondary | ICD-10-CM

## 2021-01-23 DIAGNOSIS — R072 Precordial pain: Secondary | ICD-10-CM

## 2021-01-23 DIAGNOSIS — Z86711 Personal history of pulmonary embolism: Secondary | ICD-10-CM

## 2021-01-23 DIAGNOSIS — R42 Dizziness and giddiness: Secondary | ICD-10-CM

## 2021-01-23 DIAGNOSIS — K219 Gastro-esophageal reflux disease without esophagitis: Secondary | ICD-10-CM | POA: Diagnosis present

## 2021-01-23 DIAGNOSIS — Z880 Allergy status to penicillin: Secondary | ICD-10-CM

## 2021-01-23 DIAGNOSIS — D509 Iron deficiency anemia, unspecified: Secondary | ICD-10-CM | POA: Diagnosis present

## 2021-01-23 DIAGNOSIS — I10 Essential (primary) hypertension: Secondary | ICD-10-CM | POA: Diagnosis not present

## 2021-01-23 DIAGNOSIS — R079 Chest pain, unspecified: Secondary | ICD-10-CM

## 2021-01-23 DIAGNOSIS — R0602 Shortness of breath: Secondary | ICD-10-CM

## 2021-01-23 DIAGNOSIS — E876 Hypokalemia: Secondary | ICD-10-CM | POA: Diagnosis present

## 2021-01-23 DIAGNOSIS — R0600 Dyspnea, unspecified: Secondary | ICD-10-CM | POA: Diagnosis not present

## 2021-01-23 DIAGNOSIS — R413 Other amnesia: Secondary | ICD-10-CM | POA: Diagnosis present

## 2021-01-23 DIAGNOSIS — R4789 Other speech disturbances: Secondary | ICD-10-CM | POA: Diagnosis present

## 2021-01-23 DIAGNOSIS — G4700 Insomnia, unspecified: Secondary | ICD-10-CM | POA: Diagnosis present

## 2021-01-23 DIAGNOSIS — R4189 Other symptoms and signs involving cognitive functions and awareness: Secondary | ICD-10-CM | POA: Diagnosis present

## 2021-01-23 DIAGNOSIS — I739 Peripheral vascular disease, unspecified: Secondary | ICD-10-CM | POA: Diagnosis present

## 2021-01-23 DIAGNOSIS — Z8709 Personal history of other diseases of the respiratory system: Secondary | ICD-10-CM

## 2021-01-23 DIAGNOSIS — Z79899 Other long term (current) drug therapy: Secondary | ICD-10-CM

## 2021-01-23 DIAGNOSIS — Z8249 Family history of ischemic heart disease and other diseases of the circulatory system: Secondary | ICD-10-CM

## 2021-01-23 DIAGNOSIS — I2510 Atherosclerotic heart disease of native coronary artery without angina pectoris: Secondary | ICD-10-CM | POA: Diagnosis present

## 2021-01-23 DIAGNOSIS — R64 Cachexia: Secondary | ICD-10-CM | POA: Diagnosis present

## 2021-01-23 DIAGNOSIS — Z79890 Hormone replacement therapy (postmenopausal): Secondary | ICD-10-CM

## 2021-01-23 DIAGNOSIS — Z902 Acquired absence of lung [part of]: Secondary | ICD-10-CM

## 2021-01-23 DIAGNOSIS — J9621 Acute and chronic respiratory failure with hypoxia: Secondary | ICD-10-CM | POA: Diagnosis present

## 2021-01-23 DIAGNOSIS — Z66 Do not resuscitate: Secondary | ICD-10-CM | POA: Diagnosis present

## 2021-01-23 DIAGNOSIS — R634 Abnormal weight loss: Secondary | ICD-10-CM | POA: Diagnosis present

## 2021-01-23 DIAGNOSIS — F339 Major depressive disorder, recurrent, unspecified: Secondary | ICD-10-CM | POA: Diagnosis present

## 2021-01-23 DIAGNOSIS — H903 Sensorineural hearing loss, bilateral: Secondary | ICD-10-CM | POA: Diagnosis present

## 2021-01-23 DIAGNOSIS — E43 Unspecified severe protein-calorie malnutrition: Secondary | ICD-10-CM | POA: Diagnosis present

## 2021-01-23 DIAGNOSIS — Z85118 Personal history of other malignant neoplasm of bronchus and lung: Secondary | ICD-10-CM

## 2021-01-23 DIAGNOSIS — Z8673 Personal history of transient ischemic attack (TIA), and cerebral infarction without residual deficits: Secondary | ICD-10-CM

## 2021-01-23 DIAGNOSIS — F329 Major depressive disorder, single episode, unspecified: Secondary | ICD-10-CM | POA: Diagnosis present

## 2021-01-23 DIAGNOSIS — E860 Dehydration: Secondary | ICD-10-CM | POA: Diagnosis present

## 2021-01-23 DIAGNOSIS — R9431 Abnormal electrocardiogram [ECG] [EKG]: Secondary | ICD-10-CM

## 2021-01-23 DIAGNOSIS — Z7982 Long term (current) use of aspirin: Secondary | ICD-10-CM

## 2021-01-23 DIAGNOSIS — E7800 Pure hypercholesterolemia, unspecified: Secondary | ICD-10-CM | POA: Diagnosis present

## 2021-01-23 DIAGNOSIS — F411 Generalized anxiety disorder: Secondary | ICD-10-CM | POA: Diagnosis present

## 2021-01-23 DIAGNOSIS — Z881 Allergy status to other antibiotic agents status: Secondary | ICD-10-CM

## 2021-01-23 DIAGNOSIS — I951 Orthostatic hypotension: Secondary | ICD-10-CM | POA: Diagnosis not present

## 2021-01-23 DIAGNOSIS — J9620 Acute and chronic respiratory failure, unspecified whether with hypoxia or hypercapnia: Secondary | ICD-10-CM | POA: Diagnosis present

## 2021-01-23 DIAGNOSIS — Z888 Allergy status to other drugs, medicaments and biological substances status: Secondary | ICD-10-CM

## 2021-01-23 DIAGNOSIS — I871 Compression of vein: Secondary | ICD-10-CM | POA: Diagnosis present

## 2021-01-23 DIAGNOSIS — R0789 Other chest pain: Secondary | ICD-10-CM

## 2021-01-23 DIAGNOSIS — J9611 Chronic respiratory failure with hypoxia: Secondary | ICD-10-CM

## 2021-01-23 LAB — BRAIN NATRIURETIC PEPTIDE: B Natriuretic Peptide: 274.2 pg/mL — ABNORMAL HIGH (ref 0.0–100.0)

## 2021-01-23 LAB — COMPREHENSIVE METABOLIC PANEL
ALT: 18 U/L (ref 0–44)
AST: 29 U/L (ref 15–41)
Albumin: 3.5 g/dL (ref 3.5–5.0)
Alkaline Phosphatase: 75 U/L (ref 38–126)
Anion gap: 9 (ref 5–15)
BUN: 16 mg/dL (ref 8–23)
CO2: 36 mmol/L — ABNORMAL HIGH (ref 22–32)
Calcium: 8.9 mg/dL (ref 8.9–10.3)
Chloride: 90 mmol/L — ABNORMAL LOW (ref 98–111)
Creatinine, Ser: 0.44 mg/dL (ref 0.44–1.00)
GFR, Estimated: >60 mL/min (ref >60)
Glucose, Bld: 94 mg/dL (ref 70–99)
Potassium: 3.3 mmol/L — ABNORMAL LOW (ref 3.5–5.1)
Sodium: 135 mmol/L (ref 135–145)
Total Bilirubin: 0.8 mg/dL (ref 0.3–1.2)
Total Protein: 5.8 g/dL — ABNORMAL LOW (ref 6.5–8.1)

## 2021-01-23 LAB — CBC
HCT: 32 % — ABNORMAL LOW (ref 36.0–46.0)
Hemoglobin: 10.3 g/dL — ABNORMAL LOW (ref 12.0–15.0)
MCH: 29.7 pg (ref 26.0–34.0)
MCHC: 32.2 g/dL (ref 30.0–36.0)
MCV: 92.2 fL (ref 80.0–100.0)
Platelets: 267 10*3/uL (ref 150–400)
RBC: 3.47 MIL/uL — ABNORMAL LOW (ref 3.87–5.11)
RDW: 14.3 % (ref 11.5–15.5)
WBC: 7.3 10*3/uL (ref 4.0–10.5)
nRBC: 0 % (ref 0.0–0.2)

## 2021-01-23 LAB — POC SARS CORONAVIRUS 2 AG -  ED: SARS Coronavirus 2 Ag: NEGATIVE

## 2021-01-23 LAB — TROPONIN I (HIGH SENSITIVITY)
Troponin I (High Sensitivity): 13 ng/L (ref <18)
Troponin I (High Sensitivity): 17 ng/L (ref <18)

## 2021-01-23 MED ORDER — ACETAMINOPHEN 325 MG PO TABS
650.0000 mg | ORAL_TABLET | Freq: Four times a day (QID) | ORAL | Status: DC | PRN
  Administered 2021-01-24 – 2021-01-27 (×4): 650 mg via ORAL
  Filled 2021-01-23 (×5): qty 2

## 2021-01-23 MED ORDER — CLONIDINE HCL 0.3 MG/24HR TD PTWK: 0.3000 mg | MEDICATED_PATCH | TRANSDERMAL | Status: DC

## 2021-01-23 MED ORDER — FLUTICASONE FUROATE-VILANTEROL 100-25 MCG/INH IN AEPB
1.0000 | INHALATION_SPRAY | Freq: Every day | RESPIRATORY_TRACT | Status: DC
  Administered 2021-01-24 – 2021-01-27 (×4): 1 via RESPIRATORY_TRACT
  Filled 2021-01-23: qty 28

## 2021-01-23 MED ORDER — IPRATROPIUM BROMIDE HFA 17 MCG/ACT IN AERS
2.0000 | INHALATION_SPRAY | Freq: Once | RESPIRATORY_TRACT | Status: AC
  Administered 2021-01-23: 2 via RESPIRATORY_TRACT
  Filled 2021-01-23: qty 12.9

## 2021-01-23 MED ORDER — UMECLIDINIUM BROMIDE 62.5 MCG/INH IN AEPB
1.0000 | INHALATION_SPRAY | Freq: Every day | RESPIRATORY_TRACT | Status: DC
  Administered 2021-01-24 – 2021-01-27 (×4): 1 via RESPIRATORY_TRACT
  Filled 2021-01-23: qty 7

## 2021-01-23 MED ORDER — UMECLIDINIUM BROMIDE 62.5 MCG/INH IN AEPB: 1.0000 | INHALATION_SPRAY | Freq: Every day | RESPIRATORY_TRACT | Status: DC

## 2021-01-23 MED ORDER — IOHEXOL 350 MG/ML SOLN
75.0000 mL | Freq: Once | INTRAVENOUS | Status: AC | PRN
  Administered 2021-01-23: 75 mL via INTRAVENOUS

## 2021-01-23 MED ORDER — ATORVASTATIN CALCIUM 10 MG PO TABS
20.0000 mg | ORAL_TABLET | Freq: Every day | ORAL | Status: DC
  Administered 2021-01-23 – 2021-01-26 (×4): 20 mg via ORAL
  Filled 2021-01-23 (×5): qty 2

## 2021-01-23 MED ORDER — ENOXAPARIN SODIUM 40 MG/0.4ML ~~LOC~~ SOLN: 40.0000 mg | SUBCUTANEOUS | Status: DC

## 2021-01-23 MED ORDER — IRBESARTAN 300 MG PO TABS
300.0000 mg | ORAL_TABLET | Freq: Every day | ORAL | Status: DC
  Administered 2021-01-23 – 2021-01-27 (×5): 300 mg via ORAL
  Filled 2021-01-23 (×5): qty 1

## 2021-01-23 MED ORDER — ALBUTEROL SULFATE HFA 108 (90 BASE) MCG/ACT IN AERS
4.0000 | INHALATION_SPRAY | Freq: Once | RESPIRATORY_TRACT | Status: AC
  Administered 2021-01-23: 4 via RESPIRATORY_TRACT
  Filled 2021-01-23: qty 6.7

## 2021-01-23 MED ORDER — ASPIRIN EC 81 MG PO TBEC
81.0000 mg | DELAYED_RELEASE_TABLET | Freq: Every day | ORAL | Status: DC
  Administered 2021-01-23 – 2021-01-27 (×5): 81 mg via ORAL
  Filled 2021-01-23 (×5): qty 1

## 2021-01-23 MED ORDER — IPRATROPIUM-ALBUTEROL 0.5-2.5 (3) MG/3ML IN SOLN
3.0000 mL | RESPIRATORY_TRACT | Status: DC | PRN
  Administered 2021-01-24: 3 mL via RESPIRATORY_TRACT
  Filled 2021-01-23: qty 3

## 2021-01-23 MED ORDER — FLUTICASONE FUROATE-VILANTEROL 100-25 MCG/INH IN AEPB: 1.0000 | INHALATION_SPRAY | Freq: Every day | RESPIRATORY_TRACT | Status: DC

## 2021-01-23 MED ORDER — LABETALOL HCL 200 MG PO TABS
400.0000 mg | ORAL_TABLET | Freq: Two times a day (BID) | ORAL | Status: DC
  Administered 2021-01-23 – 2021-01-24 (×2): 400 mg via ORAL
  Filled 2021-01-23 (×2): qty 2

## 2021-01-23 MED ORDER — QUETIAPINE FUMARATE 100 MG PO TABS
100.0000 mg | ORAL_TABLET | Freq: Every day | ORAL | Status: DC
  Administered 2021-01-23 – 2021-01-26 (×4): 100 mg via ORAL
  Filled 2021-01-23 (×4): qty 1

## 2021-01-23 MED ORDER — POTASSIUM CHLORIDE CRYS ER 20 MEQ PO TBCR
40.0000 meq | EXTENDED_RELEASE_TABLET | Freq: Once | ORAL | Status: AC
  Administered 2021-01-23: 40 meq via ORAL
  Filled 2021-01-23: qty 2

## 2021-01-23 MED ORDER — ACETAMINOPHEN 650 MG RE SUPP: 650.0000 mg | Freq: Four times a day (QID) | RECTAL | Status: DC | PRN

## 2021-01-23 MED ORDER — LEVOTHYROXINE SODIUM 50 MCG PO TABS
50.0000 ug | ORAL_TABLET | Freq: Every day | ORAL | Status: DC
  Administered 2021-01-24 – 2021-01-27 (×4): 50 ug via ORAL
  Filled 2021-01-23 (×4): qty 1

## 2021-01-23 MED ORDER — VORTIOXETINE HBR 20 MG PO TABS
20.0000 mg | ORAL_TABLET | Freq: Every day | ORAL | Status: DC
  Administered 2021-01-24 – 2021-01-27 (×4): 20 mg via ORAL
  Filled 2021-01-23 (×4): qty 1

## 2021-01-23 MED ORDER — ENOXAPARIN SODIUM 30 MG/0.3ML ~~LOC~~ SOLN
30.0000 mg | SUBCUTANEOUS | Status: DC
  Administered 2021-01-23 – 2021-01-26 (×4): 30 mg via SUBCUTANEOUS
  Filled 2021-01-23 (×4): qty 0.3

## 2021-01-23 NOTE — H&P (Addendum)
Cardiology Consultation:   Patient ID: [name] MRN: [id number]; DOB: [birth year]  Admit date: 01/23/2021 Date of Consult: 01/23/2021  PCP:  Lavada Mesi   [place]  Cardiologist:  Kirk Ruths, MD  Advanced Practice Provider:  No care team member to display Electrophysiologist:  None    Patient Profile:   [name] is a 75 y.o. female with a hx of syncope, PE in April 2019, hypertension, hyperlipidemia, COPD and history of lung cancer s/p lobectomy 2007 who is being seen today for the evaluation of chest pain with atypical EKG at the request of Dr. Thompson Grayer.  History of Present Illness:   [name] is a tiny 75 year old female with past medical history of syncope, PE in April 2019, hypertension, hyperlipidemia, COPD and history of lung cancer s/p lobectomy 2007.  Previous echocardiogram in April 2019 showed normal EF, normal PA pressure.  Echocardiogram obtained on 09/30/2020 showed EF 55 to 60%, grade 2 DD, mildly elevated pulmonary artery systolic pressure, mild LAE.  She was most recently seen via virtual visit by Kerin Ransom, PA-C on 02/21/2020.  Her blood pressure has been chronically elevated in the 150-160s range.  She is on 2.5 L of home O2 chronically.  Patient presents to the [facility] on 01/23/2021 due to worsening dyspnea for the past several weeks.  On exam, she does not have any significant lower extremity edema.  She does have decreased breath sounds in bilateral lungs.  Blood pressure is elevated, however she says she did not take her valsartan today.  Serial enzymes were normal.  Cardiology has been consulted for chest pain.  She says her chest pain is on the left side of her chest, it typically occur about once every 1 to 2 days.  Symptom lasts only a few few seconds before going away.  She denies any exacerbating factors.  The only alleviating factor is she hold her hand over the location of the chest pain and  apply pressure.  She had this chest pain ever since her lobectomy surgery.  EKG showed minimal ST depression, this is present on the previous EKG as well.    Past Medical History:  Diagnosis Date  . 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 CAD (coronary artery disease)    20% stenosis on cath - no intervention required (W-S cards), negative nuclear stress test 11/07  . Cancer [facility]) 2007   lung (Dr. Earlie Server and Dr. Arlyce Dice)  . COPD (chronic obstructive pulmonary disease) ([place]) 9/10   golds stage III, FeV1 39%  . Dyspnea   . Hurthle cell adenoma of thyroid 2007  . Hypertension   . Iron deficiency anemia 2/08   s/p 2 unit transfusion  . Lung cancer ([place])   . Peripheral vascular disease ([place])   . RLS (restless legs syndrome)   . Systolic murmur 4/0/9811   Echo normal 09/2013. EF 60-65 percent. trivial regurgitation of tricuspid/mitral valve     Past Surgical History:  Procedure Laterality Date  . APPENDECTOMY  18  . CHOLECYSTECTOMY  18  . heart cartherization    . LUL  2005   LUL wedge resection/VATS  . LUL  4/08   LUL lobectomy for cystic cavity and Candida, no cancer seen  . THYROIDECTOMY, PARTIAL  2007   Dr. Arlyce Dice     Home Medications:  Prior to Admission medications   Medication Sig Start Date End Date Taking? Authorizing Provider  albuterol (VENTOLIN HFA) 108 (90 Base) MCG/ACT  inhaler Inhale 2 puffs into the lungs every 6 (six) hours as needed for wheezing or shortness of breath. 10/10/20  Yes Breeback, Jade L, PA-C  ALPRAZolam (XANAX) 1 MG tablet Take 1 tablet (1 mg total) by mouth daily as needed for anxiety. 11/07/20  Yes Breeback, Jade L, PA-C  aspirin EC 81 MG tablet Take 81 mg by mouth daily.   Yes [provider]  atorvastatin (LIPITOR) 20 MG tablet Take 1 tablet (20 mg total) by mouth at bedtime. Labs for further refills 11/07/20  Yes Breeback, Jade L, PA-C  cloNIDine (CATAPRES - DOSED IN MG/24 HR) 0.3 mg/24hr patch Place 1 patch (0.3 mg total) onto the skin once  a week. Patient taking differently: Place 0.3 mg onto the skin once a week. Fridays 07/20/20  Yes Breeback, Jade L, PA-C  Fluticasone-Umeclidin-Vilant (TRELEGY ELLIPTA) 100-62.5-25 MCG/INH AEPB Inhale 1 puff into the lungs daily. 10/10/20  Yes Breeback, Jade L, PA-C  ipratropium-albuterol (DUONEB) 0.5-2.5 (3) MG/3ML SOLN Take 3 mLs by nebulization every 4 (four) hours as needed. Patient taking differently: Take 3 mLs by nebulization every 4 (four) hours as needed (shortness of breath). 01/18/21  Yes Terrilyn Saver, NP  labetalol (NORMODYNE) 200 MG tablet Take 2 tablets (400 mg total) by mouth 2 (two) times daily. 01/02/21  Yes Breeback, Jade L, PA-C  levothyroxine (SYNTHROID) 50 MCG tablet take 1 tablet by mouth once daily Patient taking differently: Take 50 mcg by mouth every morning. 07/20/20  Yes Breeback, Jade L, PA-C  QUEtiapine (SEROQUEL) 100 MG tablet Take 1 tablet (100 mg total) by mouth at bedtime. 01/30/20  Yes Breeback, Jade L, PA-C  valsartan (DIOVAN) 320 MG tablet Take 1 tablet (320 mg total) by mouth daily. 07/20/20  Yes Breeback, Jade L, PA-C  vortioxetine HBr (TRINTELLIX) 20 MG TABS tablet Take 1 tablet (20 mg total) by mouth daily. 07/20/20  Yes Breeback, Jade L, PA-C  predniSONE (DELTASONE) 20 MG tablet Take 2 tablets (40 mg total) by mouth daily with breakfast for 5 days. Patient not taking: No sig reported 01/18/21 01/23/21  Terrilyn Saver, NP    Inpatient Medications: Scheduled Meds: . aspirin EC  81 mg Oral Daily  . atorvastatin  20 mg Oral QHS  . [START ON 01/25/2021] cloNIDine  0.3 mg Transdermal Weekly  . enoxaparin (LOVENOX) injection  30 mg Subcutaneous Q24H  . fluticasone furoate-vilanterol  1 puff Inhalation Daily  . irbesartan  300 mg Oral Daily  . labetalol  400 mg Oral BID  . [START ON 01/24/2021] levothyroxine  50 mcg Oral Q0600  . QUEtiapine  100 mg Oral QHS  . umeclidinium bromide  1 puff Inhalation Daily  . [START ON 01/24/2021] vortioxetine HBr  20 mg Oral Daily    Continuous Infusions:  PRN Meds: acetaminophen **OR** acetaminophen, ipratropium-albuterol  Allergies:    Allergies  Allergen Reactions  . Avelox [Moxifloxacin] Other (See Comments)    GI upset/dizzy  . Desvenlafaxine Succinate Er Other (See Comments)    shaking  . Lisinopril-Hydrochlorothiazide Nausea And Vomiting and Cough  . Nitrofurantoin Monohyd Macro   . Norvasc [Amlodipine] Other (See Comments)    Lower extremity edema  . Penicillins Hives  . Zoloft [Sertraline Hcl] Other (See Comments)    "too many side effects"    Social History:   Social History   Socioeconomic History  . Marital status: Married    Spouse name: Juanda Crumble  . Number of children: 4  . Years of education: 32  . Highest  education level: 11th grade  Occupational History  . 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 Smoking status: Former Smoker    Packs/day: 1.50    Years: 30.00    Pack years: 45.00    Types: Cigarettes    Quit date: 10/20/2004    Years since quitting: 16.2  . Smokeless tobacco: Never Used  Vaping Use  . Vaping Use: Never used  Substance and Sexual Activity  . Alcohol use: No  . Drug use: No  . Sexual activity: Not Currently  Other Topics Concern  . Not on file  Social History Narrative   Watch Tv   Works on the [employer]   Drinks 2 cups of coffee daily   Social Determinants of Radio broadcast assistant Strain: Not on file  Food Insecurity: Not on file  Transportation Needs: No Transportation Needs  . Lack of Transportation (Medical): No  . Lack of Transportation (Non-Medical): No  Physical Activity: Not on file  Stress: Not on file  Social Connections: Not on file  Intimate Partner Violence: Not on file    Family History:    Family History  Problem Relation Age of Onset  . Cancer Mother        Larynx & Endometrial cancer  . Cancer Father        laryngeal cancer  . Hypertension Father   . Diabetes Sister   .  Multiple sclerosis Sister   . Cancer Sister 39       breast     ROS:  Please see the history of present illness.   All other ROS reviewed and negative.     Physical Exam/Data:   Vitals:   01/23/21 1234 01/23/21 1245 01/23/21 1542  BP: (!) 188/64  (!) 199/62  Pulse: 64  62  Resp: 18  (!) 25  Temp: 98.2 F (36.8 C)    TempSrc: Oral    SpO2: 100% 100% 100%   No intake or output data in the 24 hours ending 01/23/21 1929 Last 3 Weights 01/18/2021 12/18/2020 10/09/2020  Weight (lbs) 91 lb 1.6 oz 95 lb 102 lb  Weight (kg) 41.323 kg 43.092 kg 46.267 kg     There is no height or weight on file to calculate BMI.  General: Frail, small HEENT: normal Lymph: no adenopathy Neck: no JVD Endocrine:  No thryomegaly Vascular: No carotid bruits; FA pulses 2+ bilaterally without bruits  Cardiac:  normal S1, S2; RRR; no murmur  Lungs: Decreased breath sound bilaterally. Abd: soft, nontender, no hepatomegaly  Ext: no edema Musculoskeletal:  No deformities, BUE and BLE strength normal and equal Skin: warm and dry  Neuro:  CNs 2-12 intact, no focal abnormalities noted Psych:  Normal affect   EKG:  The EKG was personally reviewed and demonstrates:  NSR with minimal ST depression in the inferior leads Telemetry:  Telemetry was personally reviewed and demonstrates:  NSR without significant ventricular ectopy  Relevant CV Studies:  Echo 09/30/2020 1. Left ventricular ejection fraction, by estimation, is 55 to 60%. The  left ventricle has normal function. The left ventricle has no regional  wall motion abnormalities. Left ventricular diastolic parameters are  consistent with Grade II diastolic  dysfunction (pseudonormalization). Elevated left ventricular end-diastolic  pressure.  2. Right ventricular systolic function is normal. The right ventricular  size is normal. There is mildly elevated pulmonary artery systolic  pressure.  3. Left atrial size was mildly dilated.  4.  The mitral  valve is normal in structure. Mild mitral valve  regurgitation. No evidence of mitral stenosis.  5. Nodular calcification of non coronary cusp. The aortic valve is  tricuspid. Aortic valve regurgitation is not visualized. Mild to moderate  aortic valve sclerosis/calcification is present, without any evidence of  aortic stenosis.  6. The inferior vena cava is normal in size with greater than 50%  respiratory variability, suggesting right atrial pressure of 3 mmHg.   Laboratory Data:  High Sensitivity Troponin:   Recent Labs  Lab 01/23/21 1335 01/23/21 1540  TROPONINIHS 13 17     Chemistry Recent Labs  Lab 01/18/21 1347 01/23/21 1335  NA 137 135  K 3.5 3.3*  CL 92* 90*  CO2 40* 36*  GLUCOSE 190* 94  BUN 17 16  CREATININE 0.51* 0.44  CALCIUM 9.1 8.9  GFRNONAA 95 >60  GFRAA 111  --   ANIONGAP  --  9    Recent Labs  Lab 01/23/21 1335  PROT 5.8*  ALBUMIN 3.5  AST 29  ALT 18  ALKPHOS 75  BILITOT 0.8   Hematology Recent Labs  Lab 01/18/21 1347 01/23/21 1335  WBC 8.3 7.3  RBC 3.29* 3.47*  HGB 9.9* 10.3*  HCT 30.1* 32.0*  MCV 91.5 92.2  MCH 30.1 29.7  MCHC 32.9 32.2  RDW 13.0 14.3  PLT 228 267   BNP Recent Labs  Lab 01/23/21 1335  BNP 274.2*    DDimer No results for input(s): DDIMER in the last 168 hours.   Radiology/Studies:  DG Chest 2 View  Result Date: 01/23/2021 CLINICAL DATA:  Shortness of breath. History of coronary artery disease, COPD, lung cancer. EXAM: CHEST - 2 VIEW COMPARISON:  Chest x-ray 01/02/2021 01/26/2018. Chest CT 11/02/2020. FINDINGS: Mediastinum is stable. Heart size stable. Patient status post left upper lobectomy with stable left pleuroparenchymal thickening. No acute infiltrate. Reference is made to recent chest CT report for discussion of pulmonary nodules present. No pleural effusion or pneumothorax. Surgical clips left neck. Carotid vascular calcification. Stable postsurgical changes left upper ribs. No acute bony abnormality  identified. IMPRESSION: 1. Patient status post left upper lobectomy with stable left pleural-parenchymal thickening consistent scarring. No acute infiltrate. Reference is made to recent chest CT report for discussion of pulmonary nodules present. 2.  Carotid vascular disease. Electronically Signed   By: Marcello Moores  Register   On: 01/23/2021 14:18   CT ANGIO CHEST PE W OR WO CONTRAST  Result Date: 01/23/2021 CLINICAL DATA:  Shortness of breath for a few weeks worse this morning, history of lung cancer and resection, COP, hypertension EXAM: CT ANGIOGRAPHY CHEST WITH CONTRAST TECHNIQUE: Multidetector CT imaging of the chest was performed using the standard protocol during bolus administration of intravenous contrast. Multiplanar CT image reconstructions and MIPs were obtained to evaluate the vascular anatomy. CONTRAST:  14mL OMNIPAQUE IOHEXOL 350 MG/ML SOLN IV COMPARISON:  11/02/2020 FINDINGS: Cardiovascular: Atherosclerotic calcifications aorta, proximal great vessels and coronary arteries. Aorta normal caliber without aneurysm or dissection. Heart unremarkable. No pericardial fusion. Pulmonary arteries adequately opacified and patent. No evidence of pulmonary embolism. Extensive collateral flow of contrast through the spine, chest wall, azygos, and hemi azygous systems due to occlusion of LEFT brachiocephalic vein. Mediastinum/Nodes: Base of cervical region normal appearance. Esophagus unremarkable. Mediastinal shift RIGHT to LEFT. No thoracic adenopathy. Lungs/Pleura: Postsurgical changes LEFT upper lobe. Emphysematous changes consistent with COPD. Overall volume loss in the LEFT hemithorax. Minimal chronic ground-glass opacity in the RIGHT upper lobe, 12 mm diameter, previously 11  mm. Small nodular focus RIGHT upper lobe 7 mm diameter previously 6 mm. No new infiltrate or mass. No pleural effusion or pneumothorax. Small loculated gas collection in the anterolateral LEFT upper hemithorax unchanged. Upper Abdomen:  Unremarkable Musculoskeletal: No acute osseous findings. Review of the MIP images confirms the above findings. IMPRESSION: No evidence of pulmonary embolism. Chronic occlusion of LEFT brachiocephalic vein with extensive collateral flow of contrast through the spine, chest wall, azygos, and hemi azygous systems. COPD changes with postsurgical changes LEFT upper lobe. Chronic ground-glass opacities in the RIGHT upper lobe unchanged. Small chronic loculated gas collection in the anterolateral LEFT hemithorax related to prior surgery. Aortic Atherosclerosis (ICD10-I70.0) and Emphysema (ICD10-J43.9). Electronically Signed   By: Lavonia Dana M.D.   On: 01/23/2021 18:27     Assessment and Plan:   1. Chest pain with abnormal EKG  -Characteristic of the chest pain is very atypical as they only last for a few seconds at a time.  And alleviated by applying pressure over the location of the chest pain.  -Previous echocardiogram in late 2021 showed normal ejection fraction.  -EKG shows minimal ST depression, however this is also present on the previous EKG, no further work-up is recommended at this time.  2. Acute on chronic dyspnea: Patient is not volume overloaded on exam.  CTA of the chest negative for PE.  3. COPD: On 2.5 L/min home O2  4. History of PE: CTA negative for recurrent PE  5. Hypertension: Blood pressure elevated in the 200 range in the emergency room.  She says she did not take her to 30 mg valsartan at home today.  Her typical blood pressure at home has been ranging in the 846-659D systolic.  6. Hyperlipidemia   Risk Assessment/Risk Scores:     HEAR Score (for undifferentiated chest pain):             For questions or updates, please contact [place] Please consult [email or web address] for contact info under    Hilbert Corrigan, [place]  01/23/2021 7:29 PM  Personally seen and examined. Agree with above.  75 year old with prior lobectomy, chronic 2.5 L at home oxygen requirement,  echocardiogram 09/30/2020 with EF 55 to 60% who presented with shortness of breath, chronic waxing and waning chest pain.  A CT scan of the chest was performed that showed no evidence of dissection, no evidence of PE.  Personally reviewed, heart shifted leftward secondary to lobectomy.  Worsening dyspnea over the past several weeks.  On exam she does not appear to be volume overloaded.  Actually currently appears fairly comfortable, no increased respiratory effort when examining her.  Heart is regular rate and rhythm, PMI shifted secondary to lobectomy.  No murmurs.  No edema.  She is thin, somewhat cachectic.  In regards to chest pain, she states that she has had this for years and when I asked her if she was having active chest pain she says it still hurts somewhat.  I asked her to point and she described a small area in the left breast region.  She pushed on that region and the pain changed and improved.  No rashes noted.  Troponins were both normal.  Assessment and plan:  Atypical chest pain -Musculoskeletal in etiology.  Change with palpation on chest wall.  Troponins are normal.  EKG does demonstrate nonspecific ST-T wave changes, LVH pattern given small body habitus and heart shifting status post lobectomy.  When compared to several prior EKGs, there is no significant change.  Wandering baseline artifact noted on current ECG. -No further cardiac work-up warranted. -Recent echocardiogram reassuring.  COPD -Continue with home oxygen.  Per pulmonary use assistance.  Essential hypertension -Typical blood pressure at home is in the 150 range.  She did not take her 30 mg of valsartan at home today.  Blood pressure was quite high originally in the emergency room.  No evidence of dissection.  Acute on chronic dyspnea -No evidence of volume overload on exam.  CTA is negative for PE.  Per pulmonary.  We will go ahead and sign off.  Please let us know if we can be of further assistance.  Candee Furbish, MD

## 2021-01-23 NOTE — H&P (Addendum)
[facility] Admission History and Physical Service Pager: [phone]  Patient name: [name] [facility] Medical record number: [id number] Date of birth: 1945/11/14 Age: 75 y.o. Gender: female  Primary Care Provider: Donella Stade, PA-C Consultants: Cardiology Code Status: DNR  Preferred Emergency Contact: Granddaughter - [place]    Name Relation Home Work Mobile   [place] Granddaughter   [phone]     Chief Complaint: Shortness of breath/chest pain  Assessment and Plan: Jahyra Sukup is a 75 y.o. female presenting with dyspnea. PMH is significant for lung cancer status post lobectomy in 2007, COPD on oxygen at baseline, hypertension, hypothyroidism, major depression disorder, history of anxiety, history of anemia, hyperlipidemia.  Dyspnea/chest pain: 75 year old female with a history of lung cancer status post lobectomy presenting with 1 day of worsening dyspnea and ongoing chest pain.  Initial troponin of 13.  BNP elevated at 274.  Initial EKG is not in the patient's chart but per the ED provider shows some inferior and lateral ST depressions compared to previous EKGs. Chest x-ray shows status post left upper lobectomy with stable left pleural-parenchymal thickening consistent with scarring.  No acute infiltrate.  Patient continues on her home oxygen of 2-3 L.  Patient states her chest pain is the same as she has had for years after her lobectomy.  If feels better with pressure against her chest.  Her dyspnea is what brought her into the hospital and she states that she woke up this morning feeling short of breath.  She states that this time she still feels short of breath though she continues on her home oxygen of 2-3 L at this time.  Of note the nurse informed me that the oxygen tank ran out prior to her being able to switch it out and the patient continued to sat well for several minutes before it was changed.  Differential for  the patient's dyspnea can include ACS with some change in EKG, possibly heart failure with an elevated BNP though patient does not have any peripheral edema, no significant x-ray findings of edema, and continues on her home oxygen dosing.  COPD exacerbation is possible, particularly as the patient recently had a treatment for this and finished her steroids yesterday per documentation.  The ED provider also noted some wheezing on exam which was not present at our examination after the patient had received albuterol but this also supports the possibility of a COPD exacerbation.  With her history of malignancy and the sudden onset of this dyspnea PE seems equally likely. It does appear the patient had a PE diagnosed in 2019 per chart review for which she was continued on Eliquis for 6 months. -Admit to med telemetry, attending Dr. McDiarmid -Cardiology consult for EKG changes, appreciate recommendations -CTA to rule out PE -Follow-up second troponin -Continuous cardiac monitoring -Continuous pulse ox -PT/OT evaluate and treat -We will continue with albuterol/duo nebs  COPD on oxygen at baseline Home oxygen requirement of 2-3 L.  Home medications also include albuterol, DuoNeb. -Continuous pulse ox as above -Continue home oxygen at 2 L -Continue albuterol and duo nebs as above -May consider discharging patient on the continued dose of steroids for few more days if it is thought that a COPD exacerbation is the most likely caused by the time we gather more information  HTN: Clonodine patch placed Friday (she changes it every Friday). She didn't take her valsartan yet today but took everything else.  Other home medications include labetalol 400 mg  twice daily, valsartan 320 mg daily. -Continue home medications -Do not need to change clonidine patch until Friday per patient.  Hypokalemia: Noted on initial labs with a potassium of 3.3. -Replete with 40 of K. Dur -A.m. BMP  Hypothyroidism,  postsurgical Home medication include Synthroid 50 mcg/day.  Most recent TSH of 1.54 on 08/28/2020. -Continue home Synthroid -Recheck TSH  Hyperlipidemia Home medication include atorvastatin 20 mg/day.  Most recent LDL of 59 on 08/28/2020. -Continue home atorvastatin  History of anemia Current hemoglobin of 10.3.  No current medications related to this. -A.m. CBC  MDD/History of anxiety Medications include Trintellix, Seroquel, Xanax.  Patient states she takes 1-2 Xanax per week.  PDMP shows 1 prescription for Xanax with 1 week refill filled on: 11/07/20 for 90 count, refill on 01/21/2021.  Patient states she only takes Xanax once or twice per week. -Continue home Trintellix and Seroquel -We will hold Xanax at this time, can consider restarting if patient needs for anxiety as she only takes this once or twice per week, no concern for withdrawal at this time  History of lung cancer Status post lobectomy in 2007.  FEN/GI: Regular diet Prophylaxis: Lovenox  Disposition: Observation, med telemetry  History of Present Illness:  [name] is a 75 y.o. female presenting with chest pain for a few weeks. She has felt some shortness of breath when she woke up this morning. She endorses left sided chest pain which she says has been going on since her lobectomy which was in 2007. She states that what brought her in was her difficulty breathing when she woke up this morning.  She states that she woke up from sleep feeling short of breath.  She has not had to increase her oxygen requirement continues on her home 2-3 L.  She was recently treated for possible COPD exacerbation with 5 days of prednisone which ended yesterday.  Years she was also treated about 1 month ago, admitted at [facility] from 3/8-3/11 for COPD exacerbation multifocal pneumonia required BiPAP and admission into the ICU and discharged on moxifloxacin to and on 12/30/2020.  She also had an appointment scheduled with her  pulmonologist here in [place] on [address] today but didn't make it.   She states she uses Xanax once to twice per week.  She quit smoking in 2007 and previously smoked 2 to 3 packs/day for about 20 years.  She denies any alcohol use.  Denies any illicit drug use.  Review Of Systems: Per HPI with the following additions:   Review of Systems  Constitutional: Negative for fever.  HENT: Negative for congestion and sore throat.   Respiratory: Positive for cough and shortness of breath.   Cardiovascular: Positive for chest pain (chronic).     Patient Active Problem List   Diagnosis Date Noted  . Dyspnea 01/23/2021  . Underweight due to inadequate caloric intake 01/04/2021  . Hypokalemia 01/04/2021  . Unintended weight loss 12/24/2020  . Malnutrition of moderate degree ([place]) 12/19/2020  . Protein-calorie malnutrition, severe 10/02/2020  . Acute respiratory failure ([place]) 09/30/2020  . Right upper lobe pulmonary nodule 09/29/2020  . COPD exacerbation ([place]) 09/29/2020  . Elevated troponin 09/29/2020  . CAP (community acquired pneumonia) 09/29/2020  . Dyslipidemia (high LDL; low HDL) 07/27/2020  . Chronic daily headache 01/06/2020  . Hypercapnia 06/28/2019  . RLS (restless legs syndrome) 06/28/2019  . Hyponatremia 05/04/2019  . Epigastric pain 05/04/2019  . Anemia of chronic disease 12/20/2018  . Essential hypertension, benign 07/30/2018  .  Headache, new daily persistent ([place]) 07/28/2018  . Breast asymmetry 06/21/2018  . Chronic respiratory failure ([place]) 04/02/2018  . Venous stasis of lower extremity 02/25/2018  . Primary osteoarthritis of both hands 02/25/2018  . History of pulmonary embolus (PE) 01/29/2018  . Exertional chest pain 01/26/2018  . Depression with anxiety 08/01/2017  . Lung cancer ([place]) 08/01/2017  . Cavus foot, acquired 04/28/2017  . Left foot pain 02/17/2017  . Chronic pain of left ankle 12/16/2016  . Osteopenia 04/30/2016  . Anxiety as acute reaction  to exceptional stress 12/05/2015  . Muscle spasm of back 12/05/2015  . Memory changes 12/05/2015  . Elevated blood pressure 10/16/2015  . Split S2 (second heart sound) 10/16/2015  . IFG (impaired fasting glucose) 04/12/2015  . Transient cerebral ischemic attack 03/01/2014  . Carotid artery narrowing 02/27/2014  . Clinical depression 02/27/2014  . Episode of abnormal behavior 11/28/2013  . Word finding difficulty 11/28/2013  . Memory loss 11/28/2013  . Systolic murmur 20/25/4270  . Sensorineural hearing loss of both ears 11/18/2013  . Insomnia 09/04/2013  . GERD (gastroesophageal reflux disease) 09/04/2013  . History of hip fracture 09/02/2013  . Hypoxemia 06/22/2013  . Cerebrovascular accident, old 06/14/2013  . HYPOTHYROIDISM, POSTSURGICAL 05/19/2007  . DISEASE, ISCHEMIC HEART, CHRONIC NOS 05/19/2007  . ANEMIA, IRON DEFICIENCY NOS 01/04/2007  . History of lung cancer 07/28/2006  . HYPERCHOLESTEROLEMIA 07/28/2006  . Major depressive disorder, recurrent episode ([place]) 07/28/2006  . Anxiety state 07/28/2006  . PERIPHERAL VASCULAR DISEASE, UNSPEC. 07/28/2006  . COPD with chronic bronchitis ([place]) 07/28/2006    Past Medical History: Past Medical History:  Diagnosis Date  . 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 CAD (coronary artery disease)    20% stenosis on cath - no intervention required (W-S cards), negative nuclear stress test 11/07  . Cancer [facility]) 2007   lung (Dr. Earlie Server and Dr. Arlyce Dice)  . COPD (chronic obstructive pulmonary disease) ([place]) 9/10   golds stage III, FeV1 39%  . Dyspnea   . Hurthle cell adenoma of thyroid 2007  . Hypertension   . Iron deficiency anemia 2/08   s/p 2 unit transfusion  . Lung cancer ([place])   . Peripheral vascular disease ([place])   . RLS (restless legs syndrome)     Past Surgical History: Past Surgical History:  Procedure Laterality Date  . APPENDECTOMY  18  . CHOLECYSTECTOMY  18  . heart cartherization    . LUL  2005   LUL wedge  resection/VATS  . LUL  4/08   LUL lobectomy for cystic cavity and Candida, no cancer seen  . THYROIDECTOMY, PARTIAL  2007   Dr. Arlyce Dice    Social History: Social History   Tobacco Use  . Smoking status: Former Smoker    Packs/day: 1.50    Years: 30.00    Pack years: 45.00    Types: Cigarettes    Quit date: 10/20/2004    Years since quitting: 16.2  . Smokeless tobacco: Never Used  Vaping Use  . Vaping Use: Never used  Substance Use Topics  . Alcohol use: No  . Drug use: No   Additional social history:   Please also refer to relevant sections of EMR.  Family History: Family History  Problem Relation Age of Onset  . Cancer Mother        Larynx & Endometrial cancer  . Cancer Father        laryngeal cancer  . Hypertension Father   . Diabetes Sister   . Multiple sclerosis  Sister   . Cancer Sister 54       breast   Allergies and Medications: Allergies  Allergen Reactions  . Avelox [Moxifloxacin] Other (See Comments)    GI upset/dizzy  . Desvenlafaxine Succinate Er Other (See Comments)    shaking  . Lisinopril-Hydrochlorothiazide Nausea And Vomiting and Cough  . Nitrofurantoin Monohyd Macro   . Norvasc [Amlodipine] Other (See Comments)    Lower extremity edema  . Penicillins Hives  . Zoloft [Sertraline Hcl] Other (See Comments)    "too many side effects"   No current facility-administered medications on file prior to encounter.   Current Outpatient Medications on File Prior to Encounter  Medication Sig Dispense Refill  . albuterol (VENTOLIN HFA) 108 (90 Base) MCG/ACT inhaler Inhale 2 puffs into the lungs every 6 (six) hours as needed for wheezing or shortness of breath. 3 each 3  . ALPRAZolam (XANAX) 1 MG tablet Take 1 tablet (1 mg total) by mouth daily as needed for anxiety. 90 tablet 1  . aspirin EC 81 MG tablet Take 81 mg by mouth daily.    Marland Kitchen atorvastatin (LIPITOR) 20 MG tablet Take 1 tablet (20 mg total) by mouth at bedtime. Labs for further refills 90 tablet 2   . cloNIDine (CATAPRES - DOSED IN MG/24 HR) 0.3 mg/24hr patch Place 1 patch (0.3 mg total) onto the skin once a week. (Patient taking differently: Place 0.3 mg onto the skin once a week. Fridays) 12 patch 3  . Fluticasone-Umeclidin-Vilant (TRELEGY ELLIPTA) 100-62.5-25 MCG/INH AEPB Inhale 1 puff into the lungs daily. 180 each 0  . ipratropium-albuterol (DUONEB) 0.5-2.5 (3) MG/3ML SOLN Take 3 mLs by nebulization every 4 (four) hours as needed. (Patient taking differently: Take 3 mLs by nebulization every 4 (four) hours as needed (shortness of breath).) 360 mL 1  . labetalol (NORMODYNE) 200 MG tablet Take 2 tablets (400 mg total) by mouth 2 (two) times daily. 360 tablet 1  . levothyroxine (SYNTHROID) 50 MCG tablet take 1 tablet by mouth once daily (Patient taking differently: Take 50 mcg by mouth every morning.) 90 tablet 3  . QUEtiapine (SEROQUEL) 100 MG tablet Take 1 tablet (100 mg total) by mouth at bedtime. 90 tablet 1  . valsartan (DIOVAN) 320 MG tablet Take 1 tablet (320 mg total) by mouth daily. 90 tablet 3  . vortioxetine HBr (TRINTELLIX) 20 MG TABS tablet Take 1 tablet (20 mg total) by mouth daily. 90 tablet 1  . predniSONE (DELTASONE) 20 MG tablet Take 2 tablets (40 mg total) by mouth daily with breakfast for 5 days. (Patient not taking: No sig reported) 10 tablet 0    Objective: BP (!) 199/62 (BP Location: Right Arm)   Pulse 62   Temp 98.2 F (36.8 C) (Oral)   Resp (!) 25   SpO2 100%  Exam: General: Alert and oriented, no apparent distress but does say she feels dyspneic  Eyes: PERRLA ENTM: No pharyengeal erythema Neck: nontender Cardiovascular: RRR, S1, S2 present.  I did not hear any murmur Respiratory: Nasal cannula in place, mild tachypnea on exam, some fine crackles noted in right lower field, difficult to auscultate breath sounds in left upper lobe consistent with her prior history of lobectomy Gastrointestinal: Bowel sounds present. No abdominal pain Lower extremity: No  peripheral edema noted in her lower extremities.  Patient with good peripheral pulses in her bilateral feet. Derm: No rashes noted Neuro: Alert and oriented to person, place, time.  No obvious focal deficits. Psych: Behavior and speech appropriate  to situation  Labs and Imaging: CBC BMET  Recent Labs  Lab 01/23/21 1335  WBC 7.3  HGB 10.3*  HCT 32.0*  PLT 267   Recent Labs  Lab 01/23/21 1335  NA 135  K 3.3*  CL 90*  CO2 36*  BUN 16  CREATININE 0.44  GLUCOSE 94  CALCIUM 8.9      Lurline Del, DO 01/23/2021, 5:28 PM PGY-2, [place] Intern pager: [phone], text pages welcome

## 2021-01-23 NOTE — Progress Notes (Signed)
@Patient  ID: [name], female    DOB: [birth year], 75 y.o.   MRN: [id number]  No chief complaint on file.   Referring provider: Lavada Mesi  HPI: 75 year old female, former smoker (45 pack year hx). PMH significant for COPD (FEV1 0.69L), chronic respiratory failure, non-small cell lung cancer s/p LUL wedge resection and seed implant, PE in 2019, HTN, CVA, ischemic heart disease, GERD, postsurgical hypothyroidism, dyslipidemia, memory changes. Patient of Dr. Lamonte Sakai, last seen 07/14/18. Maintained on Trelegy, albuterol and Eliquis. Stopped Brovana during last visit. Uses O2.   01/23/2021 Patient presents today for acute follow-up. She reports worsening shortness of breath. Recently admitted [facility] from 3/8-3/11 for COPD exacerbation and multifocal pneumonia requiring BIPAP and admission to MICU. Discharged on mocifloaxacin, end date 12/30/20. Re-referred to pulmonary by PCP on 01/02/21, she has previously declined appointment with pulmonary in the past due to location of office. CO2 was >100 during most recent hospitalization. Repeat CO2 was 39 and potassium 4.5 as off 3/17. Weaning O2 from 4l to 2l. She was seen by primary care at [facility] on 01/18/21 for shortness of breath. She was given course of prednisone 40mg  x 5 days. She is losing weight. CT imaging in January 2022 showed pulmonary nodules, recommending repeat imaging in 1 year.   Maintained on Trelegy and duoneb BID. She is on Trilogy ventilator at night.  No recent PFTs.        Allergies  Allergen Reactions  . Avelox [Moxifloxacin]     GI upset/dizzy  . Desvenlafaxine Succinate Er Other (See Comments)    shaking  . Lisinopril-Hydrochlorothiazide     REACTION: N/V cough  . Nitrofurantoin Monohyd Macro   . Norvasc [Amlodipine]     Lower extremity edema  . Penicillins     REACTION: Hives  . Zoloft [Sertraline Hcl] Other (See Comments)    "too many side effects"    Immunization History   Administered Date(s) Administered  . Fluad Quad(high Dose 65+) 07/20/2020  . H1N1 08/21/2008  . Influenza Split 09/02/2011, 09/07/2012  . Influenza Whole 08/25/2006, 08/19/2007, 08/21/2008, 07/23/2009, 07/31/2010  . Influenza, High Dose Seasonal PF 07/30/2016, 06/18/2018  . Influenza,inj,Quad PF,6+ Mos 07/20/2013, 08/06/2015, 07/29/2017, 06/21/2019  . Influenza-Unspecified 08/21/2008  . Moderna Sars-Covid-2 Vaccination 01/03/2020, 01/31/2020  . Pneumococcal Conjugate-13 05/11/2014  . Pneumococcal Polysaccharide-23 08/25/2006, 11/29/2012  . Tdap 03/04/2011  . Zoster 12/25/2011    Past Medical History:  Diagnosis Date  . 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 CAD (coronary artery disease)    20% stenosis on cath - no intervention required (W-S cards), negative nuclear stress test 11/07  . Cancer [facility]) 2007   lung (Dr. Earlie Server and Dr. Arlyce Dice)  . COPD (chronic obstructive pulmonary disease) ([place]) 9/10   golds stage III, FeV1 39%  . Dyspnea   . Hurthle cell adenoma of thyroid 2007  . Hypertension   . Iron deficiency anemia 2/08   s/p 2 unit transfusion  . Lung cancer ([place])   . Peripheral vascular disease ([place])   . RLS (restless legs syndrome)     Tobacco History: Social History   Tobacco Use  Smoking Status Former Smoker  . Packs/day: 1.50  . Years: 30.00  . Pack years: 45.00  . Types: Cigarettes  . Quit date: 10/20/2004  . Years since quitting: 16.2  Smokeless Tobacco Never Used   Counseling given: Not Answered   Outpatient Medications Prior to Visit  Medication Sig Dispense Refill  . albuterol (VENTOLIN HFA) 108 (  90 Base) MCG/ACT inhaler Inhale 2 puffs into the lungs every 6 (six) hours as needed for wheezing or shortness of breath. 3 each 3  . ALPRAZolam (XANAX) 1 MG tablet Take 1 tablet (1 mg total) by mouth daily as needed for anxiety. 90 tablet 1  . aspirin EC 81 MG tablet Take 81 mg by mouth daily.    Marland Kitchen atorvastatin (LIPITOR) 20 MG tablet Take 1 tablet (20 mg  total) by mouth at bedtime. Labs for further refills 90 tablet 2  . cloNIDine (CATAPRES - DOSED IN MG/24 HR) 0.3 mg/24hr patch Place 1 patch (0.3 mg total) onto the skin once a week. 12 patch 3  . Fluticasone-Umeclidin-Vilant (TRELEGY ELLIPTA) 100-62.5-25 MCG/INH AEPB Inhale 1 puff into the lungs daily. 180 each 0  . ipratropium-albuterol (DUONEB) 0.5-2.5 (3) MG/3ML SOLN Take 3 mLs by nebulization every 4 (four) hours as needed. 360 mL 1  . labetalol (NORMODYNE) 200 MG tablet Take 2 tablets (400 mg total) by mouth 2 (two) times daily. 360 tablet 1  . levothyroxine (SYNTHROID) 50 MCG tablet take 1 tablet by mouth once daily 90 tablet 3  . predniSONE (DELTASONE) 20 MG tablet Take 2 tablets (40 mg total) by mouth daily with breakfast for 5 days. 10 tablet 0  . QUEtiapine (SEROQUEL) 100 MG tablet Take 1 tablet (100 mg total) by mouth at bedtime. 90 tablet 1  . valsartan (DIOVAN) 320 MG tablet Take 1 tablet (320 mg total) by mouth daily. 90 tablet 3  . vortioxetine HBr (TRINTELLIX) 20 MG TABS tablet Take 1 tablet (20 mg total) by mouth daily. 90 tablet 1   No facility-administered medications prior to visit.      Review of Systems  Review of Systems   Physical Exam  There were no vitals taken for this visit. Physical Exam   Lab Results:  CBC    Component Value Date/Time   WBC 8.3 01/18/2021 1347   RBC 3.29 (L) 01/18/2021 1347   HGB 9.9 (L) 01/18/2021 1347   HGB 9.6 (L) 11/09/2006 1444   HCT 30.1 (L) 01/18/2021 1347   HCT 29.4 (L) 11/09/2006 1444   PLT 228 01/18/2021 1347   PLT 902 (H) 11/09/2006 1444   MCV 91.5 01/18/2021 1347   MCV 80.7 (L) 11/09/2006 1444   MCH 30.1 01/18/2021 1347   MCHC 32.9 01/18/2021 1347   RDW 13.0 01/18/2021 1347   RDW 13.1 11/09/2006 1444   LYMPHSABS 607 (L) 08/28/2020 0000   LYMPHSABS 1.1 11/09/2006 1444   MONOABS 0.8 09/07/2012 0925   MONOABS 1.0 (H) 11/09/2006 1444   EOSABS 310 08/28/2020 0000   EOSABS 0.2 11/09/2006 1444   BASOSABS 40  08/28/2020 0000   BASOSABS 0.1 11/09/2006 1444    BMET    Component Value Date/Time   NA 137 01/18/2021 1347   K 3.5 01/18/2021 1347   CL 92 (L) 01/18/2021 1347   CO2 40 (H) 01/18/2021 1347   GLUCOSE 190 (H) 01/18/2021 1347   BUN 17 01/18/2021 1347   CREATININE 0.51 (L) 01/18/2021 1347   CALCIUM 9.1 01/18/2021 1347   GFRNONAA 95 01/18/2021 1347   GFRAA 111 01/18/2021 1347    BNP    Component Value Date/Time   BNP 32 01/26/2018 1346    ProBNP No results found for: PROBNP  Imaging: DG Chest 2 View  Result Date: 01/04/2021 CLINICAL DATA:  75 year old female with history of pneumonia. EXAM: CHEST - 2 VIEW COMPARISON:  Chest x-ray 10/01/2020. FINDINGS: Status post left upper  lobectomy with compensatory hyperexpansion of the left lower lobe. Chronic pleural thickening calcification in the mid to upper left hemithorax, similar to prior examinations. No definite acute consolidative airspace disease. Trace left pleural effusion which appears chronic. No right pleural effusion. No pneumothorax. No evidence of pulmonary edema. Heart size is normal. Upper mediastinal contours are distorted, similar to prior examination. Atherosclerotic calcifications in the thoracic aorta. Surgical clips project over the left side of the thoracic inlet, potentially from prior hemithyroidectomy. IMPRESSION: 1. Stable radiographic appearance the chest, as above, without evidence of acute cardiopulmonary disease on today's examination. 2. Aortic atherosclerosis. Electronically Signed   By: Vinnie Langton M.D.   On: 01/04/2021 09:20     Assessment & Plan:   No problem-specific Assessment & Plan notes found for this encounter.     Martyn Ehrich, NP 01/23/2021

## 2021-01-23 NOTE — ED Triage Notes (Signed)
Pt BIB [facility] c/o SOB for a few weeks, worsened this morning and pt states she did not feel comfortable coming off her CPAP machine to her [place]. Pt states feet were turning purple by the time EMS arrived. Hx of lung cancer and one lung removed. Pt is AxO, verbal, and able to communicate needs.

## 2021-01-23 NOTE — ED Provider Notes (Addendum)
[place] [facility] EMERGENCY DEPARTMENT Provider Note   CSN: [id number] Arrival date & time: 01/23/21  1243     History Chief Complaint  Patient presents with  . Shortness of Breath    [name] is a 75 y.o. female.  Pt with hx copd, presents w c/o sob and chest tightness for the past 1-2 weeks. Symptoms gradual onset, mild-moderate, constant, waxing/waning, persistent, without acute or abrupt worsening today. No exertional chest pain, dyspnea is worse w exertion. No associated nausea, vomiting or diaphoresis. Denies increased cough. No known ill contacts. No fever or chills. States compliant w home meds/home o2. Denies leg pain or swelling. No pleuritic pain.   The history is provided by the patient and the EMS personnel.  Shortness of Breath Associated symptoms: chest pain   Associated symptoms: no abdominal pain, no cough, no fever, no headaches, no neck pain, no rash, no sore throat and no vomiting        Past Medical History:  Diagnosis Date  . 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 CAD (coronary artery disease)    20% stenosis on cath - no intervention required (W-S cards), negative nuclear stress test 11/07  . Cancer [facility]) 2007   lung (Dr. Earlie Server and Dr. Arlyce Dice)  . COPD (chronic obstructive pulmonary disease) ([place]) 9/10   golds stage III, FeV1 39%  . Dyspnea   . Hurthle cell adenoma of thyroid 2007  . Hypertension   . Iron deficiency anemia 2/08   s/p 2 unit transfusion  . Lung cancer ([place])   . Peripheral vascular disease ([place])   . RLS (restless legs syndrome)     Patient Active Problem List   Diagnosis Date Noted  . Underweight due to inadequate caloric intake 01/04/2021  . Hypokalemia 01/04/2021  . Unintended weight loss 12/24/2020  . Malnutrition of moderate degree ([place]) 12/19/2020  . Protein-calorie malnutrition, severe 10/02/2020  . Acute respiratory failure ([place]) 09/30/2020  . Right upper lobe pulmonary nodule 09/29/2020  . COPD  exacerbation ([place]) 09/29/2020  . Elevated troponin 09/29/2020  . CAP (community acquired pneumonia) 09/29/2020  . Dyslipidemia (high LDL; low HDL) 07/27/2020  . Chronic daily headache 01/06/2020  . Hypercapnia 06/28/2019  . RLS (restless legs syndrome) 06/28/2019  . Hyponatremia 05/04/2019  . Epigastric pain 05/04/2019  . Anemia of chronic disease 12/20/2018  . Essential hypertension, benign 07/30/2018  . Headache, new daily persistent ([place]) 07/28/2018  . Breast asymmetry 06/21/2018  . Chronic respiratory failure ([place]) 04/02/2018  . Venous stasis of lower extremity 02/25/2018  . Primary osteoarthritis of both hands 02/25/2018  . History of pulmonary embolus (PE) 01/29/2018  . Exertional chest pain 01/26/2018  . Depression with anxiety 08/01/2017  . Lung cancer ([place]) 08/01/2017  . Cavus foot, acquired 04/28/2017  . Left foot pain 02/17/2017  . Chronic pain of left ankle 12/16/2016  . Osteopenia 04/30/2016  . Anxiety as acute reaction to exceptional stress 12/05/2015  . Muscle spasm of back 12/05/2015  . Memory changes 12/05/2015  . Elevated blood pressure 10/16/2015  . Split S2 (second heart sound) 10/16/2015  . IFG (impaired fasting glucose) 04/12/2015  . Transient cerebral ischemic attack 03/01/2014  . Carotid artery narrowing 02/27/2014  . Clinical depression 02/27/2014  . Episode of abnormal behavior 11/28/2013  . Word finding difficulty 11/28/2013  . Memory loss 11/28/2013  . Systolic murmur 19/50/9326  . Sensorineural hearing loss of both ears 11/18/2013  . Insomnia 09/04/2013  . GERD (gastroesophageal reflux disease) 09/04/2013  . History  of hip fracture 09/02/2013  . Hypoxemia 06/22/2013  . Cerebrovascular accident, old 06/14/2013  . HYPOTHYROIDISM, POSTSURGICAL 05/19/2007  . DISEASE, ISCHEMIC HEART, CHRONIC NOS 05/19/2007  . ANEMIA, IRON DEFICIENCY NOS 01/04/2007  . History of lung cancer 07/28/2006  . HYPERCHOLESTEROLEMIA 07/28/2006  . Major depressive  disorder, recurrent episode ([place]) 07/28/2006  . Anxiety state 07/28/2006  . PERIPHERAL VASCULAR DISEASE, UNSPEC. 07/28/2006  . COPD with chronic bronchitis ([place]) 07/28/2006    Past Surgical History:  Procedure Laterality Date  . APPENDECTOMY  18  . CHOLECYSTECTOMY  18  . heart cartherization    . LUL  2005   LUL wedge resection/VATS  . LUL  4/08   LUL lobectomy for cystic cavity and Candida, no cancer seen  . THYROIDECTOMY, PARTIAL  2007   Dr. Arlyce Dice     OB History   No obstetric history on [name].     Family History  Problem Relation Age of Onset  . Cancer Mother        Larynx & Endometrial cancer  . Cancer Father        laryngeal cancer  . Hypertension Father   . Diabetes Sister   . Multiple sclerosis Sister   . Cancer Sister 13       breast    Social History   Tobacco Use  . Smoking status: Former Smoker    Packs/day: 1.50    Years: 30.00    Pack years: 45.00    Types: Cigarettes    Quit date: 10/20/2004    Years since quitting: 16.2  . Smokeless tobacco: Never Used  Vaping Use  . Vaping Use: Never used  Substance Use Topics  . Alcohol use: No  . Drug use: No    Home Medications Prior to Admission medications   Medication Sig Start Date End Date Taking? Authorizing Provider  albuterol (VENTOLIN HFA) 108 (90 Base) MCG/ACT inhaler Inhale 2 puffs into the lungs every 6 (six) hours as needed for wheezing or shortness of breath. 10/10/20   Breeback, Royetta Car, PA-C  ALPRAZolam (XANAX) 1 MG tablet Take 1 tablet (1 mg total) by mouth daily as needed for anxiety. 11/07/20   Donella Stade, PA-C  aspirin EC 81 MG tablet Take 81 mg by mouth daily.    [provider]  atorvastatin (LIPITOR) 20 MG tablet Take 1 tablet (20 mg total) by mouth at bedtime. Labs for further refills 11/07/20   Breeback, Jade L, PA-C  cloNIDine (CATAPRES - DOSED IN MG/24 HR) 0.3 mg/24hr patch Place 1 patch (0.3 mg total) onto the skin once a week. 07/20/20   Breeback, Jade L, PA-C   Fluticasone-Umeclidin-Vilant (TRELEGY ELLIPTA) 100-62.5-25 MCG/INH AEPB Inhale 1 puff into the lungs daily. 10/10/20   Breeback, Jade L, PA-C  ipratropium-albuterol (DUONEB) 0.5-2.5 (3) MG/3ML SOLN Take 3 mLs by nebulization every 4 (four) hours as needed. 01/18/21   Terrilyn Saver, NP  labetalol (NORMODYNE) 200 MG tablet Take 2 tablets (400 mg total) by mouth 2 (two) times daily. 01/02/21   Donella Stade, PA-C  levothyroxine (SYNTHROID) 50 MCG tablet take 1 tablet by mouth once daily 07/20/20   Breeback, Jade L, PA-C  predniSONE (DELTASONE) 20 MG tablet Take 2 tablets (40 mg total) by mouth daily with breakfast for 5 days. 01/18/21 01/23/21  Terrilyn Saver, NP  QUEtiapine (SEROQUEL) 100 MG tablet Take 1 tablet (100 mg total) by mouth at bedtime. 01/30/20   Breeback, Jade L, PA-C  valsartan (DIOVAN) 320 MG tablet  Take 1 tablet (320 mg total) by mouth daily. 07/20/20   Breeback, Jade L, PA-C  vortioxetine HBr (TRINTELLIX) 20 MG TABS tablet Take 1 tablet (20 mg total) by mouth daily. 07/20/20   Breeback, Luvenia Starch L, PA-C    Allergies    Avelox [moxifloxacin], Desvenlafaxine succinate er, Lisinopril-hydrochlorothiazide, Nitrofurantoin monohyd macro, Norvasc [amlodipine], Penicillins, and Zoloft [sertraline hcl]  Review of Systems   Review of Systems  Constitutional: Negative for chills and fever.  HENT: Negative for sore throat.   Eyes: Negative for redness.  Respiratory: Positive for shortness of breath. Negative for cough.   Cardiovascular: Positive for chest pain. Negative for palpitations and leg swelling.  Gastrointestinal: Negative for abdominal pain, diarrhea and vomiting.  Genitourinary: Negative for flank pain.  Musculoskeletal: Negative for back pain and neck pain.  Skin: Negative for rash.  Neurological: Negative for headaches.  Hematological: Does not bruise/bleed easily.  Psychiatric/Behavioral: Negative for confusion.    Physical Exam Updated Vital Signs BP (!) 188/64 (BP Location:  Right Arm)   Pulse 64   Temp 98.2 F (36.8 C) (Oral)   Resp 18   SpO2 100%   Physical Exam Vitals and nursing note reviewed.  Constitutional:      Appearance: Normal appearance. She is well-developed.  HENT:     Head: Atraumatic.     Nose: Nose normal.     Mouth/Throat:     Mouth: Mucous membranes are moist.  Eyes:     General: No scleral icterus.    Conjunctiva/sclera: Conjunctivae normal.  Neck:     Trachea: No tracheal deviation.  Cardiovascular:     Rate and Rhythm: Normal rate and regular rhythm.     Pulses: Normal pulses.     Heart sounds: Normal heart sounds. No murmur heard. No friction rub. No gallop.   Pulmonary:     Effort: Pulmonary effort is normal. No respiratory distress.     Breath sounds: Normal breath sounds.     Comments: Slight wheezing.  Abdominal:     General: Bowel sounds are normal. There is no distension.     Palpations: Abdomen is soft.     Tenderness: There is no abdominal tenderness.  Genitourinary:    Comments: No cva tenderness.  Musculoskeletal:        General: No swelling or tenderness.     Cervical back: Normal range of motion and neck supple. No rigidity. No muscular tenderness.  Skin:    General: Skin is warm and dry.     Findings: No rash.  Neurological:     Mental Status: She is alert.     Comments: Alert, speech normal.   Psychiatric:        Mood and Affect: Mood normal.     ED Results / Procedures / Treatments   Labs (all labs ordered are listed, but only abnormal results are displayed) Results for orders placed or performed during the hospital encounter of 01/23/21  CBC  Result Value Ref Range   WBC 7.3 4.0 - 10.5 K/uL   RBC 3.47 (L) 3.87 - 5.11 MIL/uL   Hemoglobin 10.3 (L) 12.0 - 15.0 g/dL   HCT 32.0 (L) 36.0 - 46.0 %   MCV 92.2 80.0 - 100.0 fL   MCH 29.7 26.0 - 34.0 pg   MCHC 32.2 30.0 - 36.0 g/dL   RDW 14.3 11.5 - 15.5 %   Platelets 267 150 - 400 K/uL   nRBC 0.0 0.0 - 0.2 %  Comprehensive metabolic panel   Result  Value Ref Range   Sodium 135 135 - 145 mmol/L   Potassium 3.3 (L) 3.5 - 5.1 mmol/L   Chloride 90 (L) 98 - 111 mmol/L   CO2 36 (H) 22 - 32 mmol/L   Glucose, Bld 94 70 - 99 mg/dL   BUN 16 8 - 23 mg/dL   Creatinine, Ser 0.44 0.44 - 1.00 mg/dL   Calcium 8.9 8.9 - 10.3 mg/dL   Total Protein 5.8 (L) 6.5 - 8.1 g/dL   Albumin 3.5 3.5 - 5.0 g/dL   AST 29 15 - 41 U/L   ALT 18 0 - 44 U/L   Alkaline Phosphatase 75 38 - 126 U/L   Total Bilirubin 0.8 0.3 - 1.2 mg/dL   GFR, Estimated >60 >60 mL/min   Anion gap 9 5 - 15  Brain natriuretic peptide  Result Value Ref Range   B Natriuretic Peptide 274.2 (H) 0.0 - 100.0 pg/mL  Troponin I (High Sensitivity)  Result Value Ref Range   Troponin I (High Sensitivity) 13 <18 ng/L   DG Chest 2 View  Result Date: 01/23/2021 CLINICAL DATA:  Shortness of breath. History of coronary artery disease, COPD, lung cancer. EXAM: CHEST - 2 VIEW COMPARISON:  Chest x-ray 01/02/2021 01/26/2018. Chest CT 11/02/2020. FINDINGS: Mediastinum is stable. Heart size stable. Patient status post left upper lobectomy with stable left pleuroparenchymal thickening. No acute infiltrate. Reference is made to recent chest CT report for discussion of pulmonary nodules present. No pleural effusion or pneumothorax. Surgical clips left neck. Carotid vascular calcification. Stable postsurgical changes left upper ribs. No acute bony abnormality identified. IMPRESSION: 1. Patient status post left upper lobectomy with stable left pleural-parenchymal thickening consistent scarring. No acute infiltrate. Reference is made to recent chest CT report for discussion of pulmonary nodules present. 2.  Carotid vascular disease. Electronically Signed   By: Marcello Moores  Register   On: 01/23/2021 14:18   DG Chest 2 View  Result Date: 01/04/2021 CLINICAL DATA:  75 year old female with history of pneumonia. EXAM: CHEST - 2 VIEW COMPARISON:  Chest x-ray 10/01/2020. FINDINGS: Status post left upper lobectomy with  compensatory hyperexpansion of the left lower lobe. Chronic pleural thickening calcification in the mid to upper left hemithorax, similar to prior examinations. No definite acute consolidative airspace disease. Trace left pleural effusion which appears chronic. No right pleural effusion. No pneumothorax. No evidence of pulmonary edema. Heart size is normal. Upper mediastinal contours are distorted, similar to prior examination. Atherosclerotic calcifications in the thoracic aorta. Surgical clips project over the left side of the thoracic inlet, potentially from prior hemithyroidectomy. IMPRESSION: 1. Stable radiographic appearance the chest, as above, without evidence of acute cardiopulmonary disease on today's examination. 2. Aortic atherosclerosis. Electronically Signed   By: Vinnie Langton M.D.   On: 01/04/2021 09:20    ED ECG REPORT   Date: 01/23/2021  Rate: 61  Rhythm: normal sinus rhythm  QRS Axis: normal  Intervals: normal  ST/T Wave abnormalities: st depression/t inv inferiorly and laterally  Conduction Disutrbances:none  Narrative Interpretation:   Old EKG Reviewed: changes noted  I have personally reviewed the EKG tracing   Radiology DG Chest 2 View  Result Date: 01/23/2021 CLINICAL DATA:  Shortness of breath. History of coronary artery disease, COPD, lung cancer. EXAM: CHEST - 2 VIEW COMPARISON:  Chest x-ray 01/02/2021 01/26/2018. Chest CT 11/02/2020. FINDINGS: Mediastinum is stable. Heart size stable. Patient status post left upper lobectomy with stable left pleuroparenchymal thickening. No acute infiltrate. Reference is made to recent chest CT report  for discussion of pulmonary nodules present. No pleural effusion or pneumothorax. Surgical clips left neck. Carotid vascular calcification. Stable postsurgical changes left upper ribs. No acute bony abnormality identified. IMPRESSION: 1. Patient status post left upper lobectomy with stable left pleural-parenchymal thickening consistent  scarring. No acute infiltrate. Reference is made to recent chest CT report for discussion of pulmonary nodules present. 2.  Carotid vascular disease. Electronically Signed   By: Marcello Moores  Register   On: 01/23/2021 14:18    Procedures Procedures   Medications Ordered in ED Medications  albuterol (VENTOLIN HFA) 108 (90 Base) MCG/ACT inhaler 4 puff (has no administration in time range)  ipratropium (ATROVENT HFA) inhaler 2 puff (has no administration in time range)    ED Course  I have reviewed the triage vital signs and the nursing notes.  Pertinent labs & imaging results that were available during my care of the patient were reviewed by me and considered in my medical decision making (see chart for details).    MDM Rules/Calculators/A&P                          Iv ns. Continuous pulse ox and cardiac monitoring. o2 [place]. Stat labs and imaging.   Reviewed nursing notes and prior charts for additional history.   Albuterol and atrovent tx.   CXR reviewed/interpreted by me - no pna.   Labs reviewed/interpreted by me - k slightly low. kcl po.    Recheck, no wheezing, breathing comfortably. Eating/drinking.  On recheck, no chest pain currently.  Given recent chest discomfort/sob, and ecg changed from prior (st dep/t inv inferior/lat), will admit.  Discussed w FP ROC - they will admit, also request cardiology consult, cardiology consulted - call back pending.   Delta trop is pending - signed out to Dr Alvino Chapel to check.          Final Clinical Impression(s) / ED Diagnoses Final diagnoses:  None    Rx / DC Orders ED Discharge Orders    None           Lajean Saver, MD 01/23/21 [phone]

## 2021-01-23 NOTE — Patient Instructions (Signed)
Continue Trelegy one puff daily  Continue Duoneb every 6 hours as needed  Continue Trilogy ventilator at night  Goal pulse ox 88-92%, wean O2 as tolerated   Needs FU with Dr. Lamonte Sakai on April 27, 28 or 29th

## 2021-01-23 NOTE — ED Notes (Signed)
Patient transported to CT 

## 2021-01-24 ENCOUNTER — Ambulatory Visit: Payer: Medicare Other | Admitting: Adult Health

## 2021-01-24 ENCOUNTER — Encounter (HOSPITAL_COMMUNITY): Payer: Self-pay | Admitting: Family Medicine

## 2021-01-24 DIAGNOSIS — R0600 Dyspnea, unspecified: Secondary | ICD-10-CM | POA: Diagnosis not present

## 2021-01-24 DIAGNOSIS — J9611 Chronic respiratory failure with hypoxia: Secondary | ICD-10-CM | POA: Diagnosis not present

## 2021-01-24 DIAGNOSIS — R634 Abnormal weight loss: Secondary | ICD-10-CM

## 2021-01-24 DIAGNOSIS — Z86711 Personal history of pulmonary embolism: Secondary | ICD-10-CM | POA: Diagnosis not present

## 2021-01-24 DIAGNOSIS — E890 Postprocedural hypothyroidism: Secondary | ICD-10-CM | POA: Diagnosis present

## 2021-01-24 DIAGNOSIS — R0789 Other chest pain: Secondary | ICD-10-CM

## 2021-01-24 DIAGNOSIS — R413 Other amnesia: Secondary | ICD-10-CM

## 2021-01-24 DIAGNOSIS — Z85118 Personal history of other malignant neoplasm of bronchus and lung: Secondary | ICD-10-CM | POA: Diagnosis not present

## 2021-01-24 DIAGNOSIS — E860 Dehydration: Secondary | ICD-10-CM | POA: Diagnosis not present

## 2021-01-24 DIAGNOSIS — H903 Sensorineural hearing loss, bilateral: Secondary | ICD-10-CM | POA: Diagnosis present

## 2021-01-24 DIAGNOSIS — Z902 Acquired absence of lung [part of]: Secondary | ICD-10-CM | POA: Diagnosis not present

## 2021-01-24 DIAGNOSIS — I10 Essential (primary) hypertension: Secondary | ICD-10-CM | POA: Diagnosis present

## 2021-01-24 DIAGNOSIS — J9621 Acute and chronic respiratory failure with hypoxia: Secondary | ICD-10-CM | POA: Diagnosis present

## 2021-01-24 DIAGNOSIS — I871 Compression of vein: Secondary | ICD-10-CM

## 2021-01-24 DIAGNOSIS — R4189 Other symptoms and signs involving cognitive functions and awareness: Secondary | ICD-10-CM | POA: Diagnosis present

## 2021-01-24 DIAGNOSIS — Z681 Body mass index (BMI) 19 or less, adult: Secondary | ICD-10-CM | POA: Diagnosis not present

## 2021-01-24 DIAGNOSIS — R4789 Other speech disturbances: Secondary | ICD-10-CM

## 2021-01-24 DIAGNOSIS — F331 Major depressive disorder, recurrent, moderate: Secondary | ICD-10-CM

## 2021-01-24 DIAGNOSIS — Z8709 Personal history of other diseases of the respiratory system: Secondary | ICD-10-CM

## 2021-01-24 DIAGNOSIS — R64 Cachexia: Secondary | ICD-10-CM | POA: Diagnosis present

## 2021-01-24 DIAGNOSIS — R0602 Shortness of breath: Secondary | ICD-10-CM | POA: Diagnosis present

## 2021-01-24 DIAGNOSIS — D509 Iron deficiency anemia, unspecified: Secondary | ICD-10-CM | POA: Diagnosis present

## 2021-01-24 DIAGNOSIS — I951 Orthostatic hypotension: Secondary | ICD-10-CM | POA: Diagnosis present

## 2021-01-24 DIAGNOSIS — Z66 Do not resuscitate: Secondary | ICD-10-CM | POA: Diagnosis present

## 2021-01-24 DIAGNOSIS — E43 Unspecified severe protein-calorie malnutrition: Secondary | ICD-10-CM | POA: Diagnosis present

## 2021-01-24 DIAGNOSIS — Z20822 Contact with and (suspected) exposure to covid-19: Secondary | ICD-10-CM | POA: Diagnosis present

## 2021-01-24 DIAGNOSIS — Z87891 Personal history of nicotine dependence: Secondary | ICD-10-CM | POA: Diagnosis not present

## 2021-01-24 DIAGNOSIS — Z8673 Personal history of transient ischemic attack (TIA), and cerebral infarction without residual deficits: Secondary | ICD-10-CM | POA: Diagnosis not present

## 2021-01-24 DIAGNOSIS — E876 Hypokalemia: Secondary | ICD-10-CM | POA: Diagnosis present

## 2021-01-24 DIAGNOSIS — Z9981 Dependence on supplemental oxygen: Secondary | ICD-10-CM | POA: Diagnosis not present

## 2021-01-24 DIAGNOSIS — R42 Dizziness and giddiness: Secondary | ICD-10-CM | POA: Diagnosis not present

## 2021-01-24 DIAGNOSIS — F329 Major depressive disorder, single episode, unspecified: Secondary | ICD-10-CM | POA: Diagnosis present

## 2021-01-24 DIAGNOSIS — F411 Generalized anxiety disorder: Secondary | ICD-10-CM | POA: Diagnosis present

## 2021-01-24 DIAGNOSIS — J449 Chronic obstructive pulmonary disease, unspecified: Secondary | ICD-10-CM | POA: Diagnosis present

## 2021-01-24 DIAGNOSIS — G4700 Insomnia, unspecified: Secondary | ICD-10-CM | POA: Diagnosis present

## 2021-01-24 LAB — BASIC METABOLIC PANEL
Anion gap: 5 (ref 5–15)
BUN: 12 mg/dL (ref 8–23)
CO2: 39 mmol/L — ABNORMAL HIGH (ref 22–32)
Calcium: 8.8 mg/dL — ABNORMAL LOW (ref 8.9–10.3)
Chloride: 90 mmol/L — ABNORMAL LOW (ref 98–111)
Creatinine, Ser: 0.51 mg/dL (ref 0.44–1.00)
GFR, Estimated: >60 mL/min (ref >60)
Glucose, Bld: 92 mg/dL (ref 70–99)
Potassium: 3.4 mmol/L — ABNORMAL LOW (ref 3.5–5.1)
Sodium: 134 mmol/L — ABNORMAL LOW (ref 135–145)

## 2021-01-24 LAB — TSH: TSH: 4.57 u[IU]/mL — ABNORMAL HIGH (ref 0.350–4.500)

## 2021-01-24 LAB — CBC
HCT: 32.4 % — ABNORMAL LOW (ref 36.0–46.0)
Hemoglobin: 10.3 g/dL — ABNORMAL LOW (ref 12.0–15.0)
MCH: 29.1 pg (ref 26.0–34.0)
MCHC: 31.8 g/dL (ref 30.0–36.0)
MCV: 91.5 fL (ref 80.0–100.0)
Platelets: 284 10*3/uL (ref 150–400)
RBC: 3.54 MIL/uL — ABNORMAL LOW (ref 3.87–5.11)
RDW: 14 % (ref 11.5–15.5)
WBC: 7.5 10*3/uL (ref 4.0–10.5)
nRBC: 0 % (ref 0.0–0.2)

## 2021-01-24 LAB — SARS CORONAVIRUS 2 (TAT 6-24 HRS): SARS Coronavirus 2: NEGATIVE

## 2021-01-24 MED ORDER — LABETALOL HCL 200 MG PO TABS
200.0000 mg | ORAL_TABLET | Freq: Two times a day (BID) | ORAL | Status: DC
  Administered 2021-01-24 – 2021-01-25 (×2): 200 mg via ORAL
  Filled 2021-01-24 (×2): qty 1

## 2021-01-24 MED ORDER — CLONIDINE HCL 0.2 MG/24HR TD PTWK
0.2000 mg | MEDICATED_PATCH | TRANSDERMAL | Status: DC
  Administered 2021-01-25: 0.2 mg via TRANSDERMAL
  Filled 2021-01-24: qty 1

## 2021-01-24 MED ORDER — SODIUM CHLORIDE 0.9 % IV BOLUS
1000.0000 mL | Freq: Once | INTRAVENOUS | Status: AC
  Administered 2021-01-24: 1000 mL via INTRAVENOUS

## 2021-01-24 NOTE — Progress Notes (Signed)
Earlier this afternoon when talking about discharge, Ms. Cerutti told me she had started getting dizzy in the bathroom this morning. She either had forgotten to tell us or we had already visited her and left, she couldn't remember which. Ordered orthostatic vitals.   Just got a call from the nurse with the orthostatic results, which are wildly positive:  Lying 194/67 pulse 61  Sitting 136/60 pulse 65  Standing at 0 min 110/59 pulse 67  Standing at 3 min 105/54 pulse 72  Of note, quick review of her med list reveals she is on clonidine patch 0.3 mg weekly and labetalol 400 mg BID.   Plan: - reduce clonidine from 0.3 mg to 0.2 mg patch - reduce labetalol from 400 mg BID to 200 mg BID - 1L NS bolus over 8 hours (125 mL/hr)  As she is no longer discharging today as previously planned, formal progress note to follow.   Ezequiel Essex, MD

## 2021-01-24 NOTE — Progress Notes (Addendum)
FPTS Interim Progress Note  S: Saw patient this morning during prerounding.  Patient relays she is quite nervous to discharge, she is nervous it will happen again.  Her granddaughter grandson in law live with her.  Denies fever, chills, nausea, vomiting.  Feels shortness of breath is improved.  O: BP 96/65 (BP Location: Left Arm)   Pulse (!) 56   Temp 97.8 F (36.6 C) (Oral)   Resp 18   Ht (P) 5\' 3"  (1.6 m)   Wt (P) 41.2 kg   SpO2 100%   BMI (P) 16.09 kg/m   General: Awake, alert, sitting upright in bed Cardiac: Regular rate and rhythm, systolic S1 murmur at left sternal border Respiratory: Poor air movement due to patient effort, lung sounds difficult to appreciate Extremities: No BLE edema Psych: Unable to correctly draw a clock face and set time to 11:10  A/P: We will discuss at rounds.  Previously plan was to discharge today given work-up negative for N/STEMI, PE, pneumonia.  Murmur heard this morning not documented in primary team H&P or cardiology H&P.  May keep another day, will discuss with team.  Ezequiel Essex, MD 01/24/2021, 9:52 AM PGY-1, [place] Medicine Service pager [phone]

## 2021-01-24 NOTE — Progress Notes (Addendum)
Family Medicine Teaching Service Daily Progress Note Intern Pager: [phone]  Patient name: [name] [facility] Medical record number: [id number] Date of birth: 1946/02/10 Age: 75 y.o. Gender: female  Primary Care Provider: Donella Stade, PA-C Consultants: Cards (s/o) Code Status: DNR  Pt Overview and Major Events to Date:  01/23/2021 admitted  Assessment and Plan: [name] is a 75 y.o. female presenting with dyspnea. PMH is significant for lung cancer status post lobectomy in 2007, COPD on oxygen at baseline, hypertension, hypothyroidism, major depression disorder, history of anxiety, history of anemia, hyperlipidemia.  Dizziness  Hx HTN This afternoon, patient complained that she had started to become dizzy this morning whenever she would get up and walk to the bathroom.  Felt as though she was stumbling around the bathroom.  Orthostatics obtained were wildly positive:  Lying 194/67 pulse 61             Sitting 136/60 pulse 65             Standing at 0 min 110/59 pulse 67             Standing at 3 min 105/54 pulse 72 Med review revealed she is on home meds of clonidine patch 0.3 mg weekly and labetalol 400 mg twice daily. -1 L normal saline bolus over 8 hours (rate of 125 mL/h) -Reduce clonidine from 0.3 mg to 0.2 mg patch -Reduce labetalol from 400 mg twice daily to 200 mg twice daily -Continuous cardiac monitoring -Continuous pulse ox -Orthostatics again in the morning -Consider bilateral carotid ultrasound in the morning as cardiovascular disease was noted on imaging today -Vitals per unit routine -PT OT eval and treat  Dyspnea/chest pain: Troponins negative x2. EKG without elevation.  CXR without acute infiltrate, does show carotid vascular disease and stable left upper lobe scarring s/p lobectomy.  CTA chest was negative for PE; however did show chronic occlusion of left brachiocephalic vein with extensive collateral flow and chronic unchanged groundglass  opacities of right upper lobe. - Cardiology consulted, signed off  COPD on oxygen at baseline Home oxygen requirement of 2-3 L.  Home medications also include albuterol, DuoNeb. -Continuous pulse ox as above -Continue home oxygen at 2 L -Continue albuterol and duo nebs as above  Hypokalemia: A.m. potassium 3.4, repletion ordered. -Follow-up a.m. BMP  Hypothyroidism, postsurgical Home medication include Synthroid 50 mcg/day.  Most recent TSH of 1.54 on 08/28/2020.  TSH on admission 4.570.  -Continue home Synthroid -We will recommend PCP make adjustments outpatient  Hyperlipidemia Continue home atorvastatin  History of anemia Hemoglobin stable, 10 point this morning.  No current medications for this. -A.m. CBC -Iron studies in the morning  MDD/History of anxiety Medications include Trintellix, Seroquel, Xanax.  Patient states she takes 1-2 Xanax per week.  PDMP shows 1 prescription for Xanax with 1 week refill filled on: 11/07/20 for 90 count, refill on 01/21/2021.  Patient states she only takes Xanax once or twice per week. -Continue home Trintellix and Seroquel -We will hold Xanax at this time, can consider restarting if patient needs for anxiety as she only takes this once or twice per week, no concern for withdrawal at this time  History of lung cancer Status post lobectomy in 2007.   FEN/GI: Regular PPx: Lovenox   Status is: Observation  The patient remains OBS appropriate and will d/c before 2 midnights.  Dispo: The patient is from: Home              Anticipated d/c  is to: Home              Patient currently is not medically stable to d/c.   Difficult to place patient No  Subjective:  Patient lying in bed comfortably, watching TV.  She says that her shortness of breath has improved since admission, but she is quite anxious that will happen again.  She also states she started getting dizzy while getting up to use the bathroom this morning, saying she felt she was  stumbling around the bathroom.  Objective: Temp:  [98 F (36.7 C)-98.3 F (36.8 C)] 98 F (36.7 C) (04/07 1716) Pulse Rate:  [57-67] 67 (04/07 1716) Resp:  [18-23] 18 (04/07 1716) BP: (146-197)/(50-82) 151/64 (04/07 1716) SpO2:  [100 %] 100 % (04/07 1716) Weight:  [41.2 kg] (P) 41.2 kg (04/07 0115)  General: Awake, alert, sitting upright in bed Cardiac: Regular rate and rhythm, systolic S1 murmur at left sternal border Respiratory: Poor air movement due to patient effort, lung sounds difficult to appreciate Extremities: No BLE edema Psych: Unable to correctly draw a clock face and set time to 11:10  Laboratory: Recent Labs  Lab 01/18/21 1347 01/23/21 1335 01/24/21 0210  WBC 8.3 7.3 7.5  HGB 9.9* 10.3* 10.3*  HCT 30.1* 32.0* 32.4*  PLT 228 267 284   Recent Labs  Lab 01/18/21 1347 01/23/21 1335 01/24/21 0210  NA 137 135 134*  K 3.5 3.3* 3.4*  CL 92* 90* 90*  CO2 40* 36* 39*  BUN 17 16 12   CREATININE 0.51* 0.44 0.51  CALCIUM 9.1 8.9 8.8*  PROT  --  5.8*  --   BILITOT  --  0.8  --   ALKPHOS  --  75  --   ALT  --  18  --   AST  --  29  --   GLUCOSE 190* 94 92   Imaging/Diagnostic Tests: CT ANGIOGRAPHY CHEST WITH CONTRAST 01/23/2021 IMPRESSION: No evidence of pulmonary embolism.  Chronic occlusion of LEFT brachiocephalic vein with extensive collateral flow of contrast through the spine, chest wall, azygos, and hemi azygous systems.  COPD changes with postsurgical changes LEFT upper lobe.  Chronic ground-glass opacities in the RIGHT upper lobe unchanged.  Small chronic loculated gas collection in the anterolateral LEFT hemithorax related to prior surgery.  Aortic Atherosclerosis (ICD10-I70.0) and Emphysema (ICD10-J43.9).  CHEST - 2 VIEW 01/24/2021 COMPARISON:  Chest x-ray 01/02/2021 01/26/2018. Chest CT 11/02/2020. FINDINGS: Mediastinum is stable. Heart size stable. Patient status post left upper lobectomy with stable left pleuroparenchymal thickening.  No acute infiltrate. Reference is made to recent chest CT report for discussion of pulmonary nodules present. No pleural effusion or pneumothorax. Surgical clips left neck. Carotid vascular calcification. Stable postsurgical changes left upper ribs. No acute bony abnormality identified. IMPRESSION: 1. Patient status post left upper lobectomy with stable left pleural-parenchymal thickening consistent scarring. No acute infiltrate. Reference is made to recent chest CT report for discussion of pulmonary nodules present. 2.  Carotid vascular disease.  Ezequiel Essex, MD 01/24/2021, 6:29 PM PGY-1, [place] Intern pager: [phone], text pages welcome

## 2021-01-24 NOTE — Hospital Course (Addendum)
[name] is a 75 y.o. female presenting with dyspnea. PMH is significant for lung cancer status post lobectomy in 2007, COPD on oxygen at baseline, hypertension, hypothyroidism, major depression disorder, history of anxiety, history of anemia, hyperlipidemia.   Dyspnea [name] initially presented to the ED with worsening dyspnea throughout the day and chronic chest pain (unchanged from baseline s/p lobectomy). Troponins negative x2 (13, 17). BNP elevated at 274.  Initial EKG is not in the patient's chart but per the ED provider shows some inferior and lateral ST depressions compared to previous EKGs. Chest x-ray shows status post left upper lobectomy with stable left pleural-parenchymal thickening consistent with scarring.  No acute infiltrate.  Patient continues on her home oxygen of 2-3 L. While in the ED, oxygen tank ran out and patient tolerated no O2 for several minutes with normal SpO2 while the nurse was changing tanks. 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   Orthostatic hypotension and dizziness Patient began complaining of dizziness with standing and ambulation that started the morning after admission. Orthostatics obtained were wildly positive:                          Lying 194/67 pulse 61             Sitting 136/60 pulse 65             Standing at 0 min 110/59 pulse 67             Standing at 3 min 105/54 pulse 72 Med review revealed she is on home meds of clonidine patch 0.3 mg weekly and labetalol 400 mg twice daily. She received multiple gentle fluid boluses and med reductions over several days, with gradual reduction in dizziness and improvement in orthostatic vitals. On day of discharge, orthostatic vitals ***. Labetalol discontinued, recommend permanent discontinuation. Clonidine patch reduced to 0.2 mg patch weekly. ***  Normocytic Anemia Hemoglobin 10.3 on admission, stayed between 9 and  10 throughout admission. Appears to be chronic. Chart review lists iron deficiency anemia on her problem list, although patient is unaware of this diagnosis and not on any iron supplementation at home. Patient cannot remember her last colonoscopy but reports a negative colo-guard screening about one year ago. Denies hemoptysis, hematemesis, hematuria, BRBPR, or dark tarry stool. Iron panel obtained while admitted negative for iron deficiency: iron 33, TIBC 284, ferritin 18. Pathology smear review in process at this time, pending results.   Other Chronic Conditions Stable This Admission:  COPD on oxygen at baseline Hypertension Hypothyroidism, post-surgical  Hyperlipidemia  Major depressive disorder Anxiety  History of lung cancer

## 2021-01-24 NOTE — Plan of Care (Signed)

## 2021-01-25 ENCOUNTER — Inpatient Hospital Stay: Payer: Medicare Other | Admitting: Adult Health

## 2021-01-25 ENCOUNTER — Inpatient Hospital Stay: Payer: Medicare Other | Admitting: Primary Care

## 2021-01-25 DIAGNOSIS — J9611 Chronic respiratory failure with hypoxia: Secondary | ICD-10-CM | POA: Diagnosis not present

## 2021-01-25 DIAGNOSIS — E860 Dehydration: Secondary | ICD-10-CM | POA: Diagnosis not present

## 2021-01-25 DIAGNOSIS — R42 Dizziness and giddiness: Secondary | ICD-10-CM

## 2021-01-25 DIAGNOSIS — R0600 Dyspnea, unspecified: Secondary | ICD-10-CM | POA: Diagnosis not present

## 2021-01-25 DIAGNOSIS — J449 Chronic obstructive pulmonary disease, unspecified: Secondary | ICD-10-CM

## 2021-01-25 LAB — BASIC METABOLIC PANEL
Anion gap: 5 (ref 5–15)
BUN: 11 mg/dL (ref 8–23)
CO2: 38 mmol/L — ABNORMAL HIGH (ref 22–32)
Calcium: 8.4 mg/dL — ABNORMAL LOW (ref 8.9–10.3)
Chloride: 92 mmol/L — ABNORMAL LOW (ref 98–111)
Creatinine, Ser: 0.49 mg/dL (ref 0.44–1.00)
GFR, Estimated: >60 mL/min (ref >60)
Glucose, Bld: 78 mg/dL (ref 70–99)
Potassium: 3.2 mmol/L — ABNORMAL LOW (ref 3.5–5.1)
Sodium: 135 mmol/L (ref 135–145)

## 2021-01-25 LAB — CBC
HCT: 28 % — ABNORMAL LOW (ref 36.0–46.0)
Hemoglobin: 9.1 g/dL — ABNORMAL LOW (ref 12.0–15.0)
MCH: 29.9 pg (ref 26.0–34.0)
MCHC: 32.5 g/dL (ref 30.0–36.0)
MCV: 92.1 fL (ref 80.0–100.0)
Platelets: 257 10*3/uL (ref 150–400)
RBC: 3.04 MIL/uL — ABNORMAL LOW (ref 3.87–5.11)
RDW: 14 % (ref 11.5–15.5)
WBC: 7.3 10*3/uL (ref 4.0–10.5)
nRBC: 0 % (ref 0.0–0.2)

## 2021-01-25 LAB — MAGNESIUM: Magnesium: 1.9 mg/dL (ref 1.7–2.4)

## 2021-01-25 LAB — IRON AND TIBC
Iron: 33 ug/dL (ref 28–170)
Saturation Ratios: 12 % (ref 10.4–31.8)
TIBC: 284 ug/dL (ref 250–450)
UIBC: 251 ug/dL

## 2021-01-25 LAB — FERRITIN: Ferritin: 18 ng/mL (ref 11–307)

## 2021-01-25 MED ORDER — SODIUM CHLORIDE 0.9 % IV BOLUS
1000.0000 mL | Freq: Once | INTRAVENOUS | Status: AC
  Administered 2021-01-25: 1000 mL via INTRAVENOUS

## 2021-01-25 MED ORDER — LABETALOL HCL 100 MG PO TABS
100.0000 mg | ORAL_TABLET | Freq: Two times a day (BID) | ORAL | Status: DC
Start: 2021-01-25 — End: 2021-01-26
  Administered 2021-01-25 – 2021-01-26 (×2): 100 mg via ORAL
  Filled 2021-01-25 (×2): qty 1

## 2021-01-25 MED ORDER — ALBUTEROL SULFATE HFA 108 (90 BASE) MCG/ACT IN AERS
2.0000 | INHALATION_SPRAY | Freq: Four times a day (QID) | RESPIRATORY_TRACT | Status: DC
  Filled 2021-01-25: qty 6.7

## 2021-01-25 MED ORDER — ALBUTEROL SULFATE HFA 108 (90 BASE) MCG/ACT IN AERS
2.0000 | INHALATION_SPRAY | RESPIRATORY_TRACT | Status: DC | PRN
  Filled 2021-01-25: qty 6.7

## 2021-01-25 MED ORDER — POTASSIUM CHLORIDE CRYS ER 20 MEQ PO TBCR
40.0000 meq | EXTENDED_RELEASE_TABLET | Freq: Two times a day (BID) | ORAL | Status: AC
  Administered 2021-01-25 (×2): 40 meq via ORAL
  Filled 2021-01-25 (×2): qty 2

## 2021-01-25 NOTE — Plan of Care (Signed)

## 2021-01-25 NOTE — Progress Notes (Signed)
Family Medicine Teaching Service Daily Progress Note Intern Pager: [phone]  Patient name: [name] [facility] Medical record number: [id number] Date of birth: 1945-12-22 Age: 75 y.o. Gender: female  Primary Care Provider: Donella Stade, PA-C Consultants: None Code Status: DNR  Pt Overview and Major Events to Date:  4/6 admitted  Assessment and Plan: [name] is a 75 y.o. female presenting with dyspnea. PMH is significant for lung cancer status post lobectomy in 2007, COPD on oxygen at baseline, hypertension, hypothyroidism, major depression disorder, history of anxiety, history of anemia, hyperlipidemia.   Dizziness  Hx HTN Patient reports still dizzy when she gets up but it is improved. S/p 1 L NS bolus last night and reduction of labetalol and clonidine. Orthostatics vitals positive but improved from yesterday:  Lying 184/62, pulse 66  Sitting 139/60, pulse 70  Standing at 0 min 141/50, pulse 72  Standing at 3 min 115/65, pulse 74 - Give another 1L NS bolus over 8 hours (hourly rate of 125 mL/hr) -Continue reduced dose of clonidine (0.2 mg patch) - Reduce labetalol further from 200 mg to 100 mg BID - AM orthostatic vitals -Continuous cardiac monitoring, continuous pulse ox -Vitals patient routine -PT OT eval and treat -May consider bilateral carotid ultrasound given cardiovascular disease started on imaging 4/7  Normocytic anemia Hemoglobin 9.1 this morning, MCV 92.1.  Hemoglobin has ranged 9.1-10.3 this admission.  Iron studies this morning normal, no indication for iron deficiency anemia. -A.m. CBC with diff - f/u smear as available   COPD on oxygen at baseline On 2-3 L oxygen at home.  Home meds include albuterol and duo nebs.  SPO2 here 100% on 2.5 L via [place]. -Continuous pulse ox -Continue albuterol and duo nebs per home routine -Wean O2 as tolerated, SPO2 goal for COPD 88-92%   Hypokalemia: A.m. potassium 3.2.  K-Dur 40 MmEq twice daily ordered x2  doses. -Follow-up a.m. BMP   Hypothyroidism, postsurgical Home medication include Synthroid 50 mcg/day.  Most recent TSH of 1.54 on 08/28/2020.  TSH on admission 4.570.  -Continue home Synthroid 50 mcg/day -Recommend PCP retesting and adjustment outpatient   Hyperlipidemia Continue home atorvastatin.    MDD/History of anxiety Medications include Trintellix, Seroquel, Xanax.  Patient states she takes 1-2 Xanax per week.  PDMP shows 1 prescription for Xanax with 1 week refill filled on: 11/07/20 for 90 count, refill on 01/21/2021.  Patient states she only takes Xanax once or twice per week. -Continue home Trintellix and Seroquel -Hold Xanax    FEN/GI: Regular PPx: Lovenox   Status is: Inpatient  Remains inpatient appropriate because:Ongoing diagnostic testing needed not appropriate for outpatient work up   Dispo: The patient is from: Home              Anticipated d/c is to: Home              Patient currently is not medically stable to d/c.   Difficult to place patient No   Subjective:  Patient laying in bed comfortably. Reports eating well with a little nausea but no vomiting. Tolerating fluids well too. Reports being dizzy when she stands but improved from yesterday.   Objective: Temp:  [97.9 F (36.6 C)-98.6 F (37 C)] 97.9 F (36.6 C) (04/08 0950) Pulse Rate:  [59-69] 66 (04/08 0950) Resp:  [16-20] 16 (04/08 0950) BP: (138-216)/(46-68) 184/62 (04/08 0950) SpO2:  [100 %] 100 % (04/08 0950) FiO2 (%):  [30 %] 30 % (04/08 7353) Physical Exam: General: awake, alert,  no acute distres Cardiovascular: RRR, systolic murmur at pulmonic and tricuspid locations, no bruit auscultated Respiratory: CTAB Abdomen: soft, non-tender Extremities: moving all spontaneously  Laboratory: Recent Labs  Lab 01/23/21 1335 01/24/21 0210 01/25/21 0220  WBC 7.3 7.5 7.3  HGB 10.3* 10.3* 9.1*  HCT 32.0* 32.4* 28.0*  PLT 267 284 257   Recent Labs  Lab 01/23/21 1335 01/24/21 0210  01/25/21 0220  NA 135 134* 135  K 3.3* 3.4* 3.2*  CL 90* 90* 92*  CO2 36* 39* 38*  BUN 16 12 11   CREATININE 0.44 0.51 0.49  CALCIUM 8.9 8.8* 8.4*  PROT 5.8*  --   --   BILITOT 0.8  --   --   ALKPHOS 75  --   --   ALT 18  --   --   AST 29  --   --   GLUCOSE 94 92 78    Imaging/Diagnostic Tests: None last 24 hours.   Ezequiel Essex, MD 01/25/2021, 12:58 PM PGY-1, [place] Intern pager: [phone], text pages welcome

## 2021-01-26 DIAGNOSIS — J9621 Acute and chronic respiratory failure with hypoxia: Secondary | ICD-10-CM

## 2021-01-26 DIAGNOSIS — R42 Dizziness and giddiness: Secondary | ICD-10-CM | POA: Diagnosis not present

## 2021-01-26 LAB — CBC WITH DIFFERENTIAL/PLATELET
Abs Immature Granulocytes: 0.05 10*3/uL (ref 0.00–0.07)
Basophils Absolute: 0 10*3/uL (ref 0.0–0.1)
Basophils Relative: 0 %
Eosinophils Absolute: 0.2 10*3/uL (ref 0.0–0.5)
Eosinophils Relative: 2 %
HCT: 31.9 % — ABNORMAL LOW (ref 36.0–46.0)
Hemoglobin: 10.4 g/dL — ABNORMAL LOW (ref 12.0–15.0)
Immature Granulocytes: 1 %
Lymphocytes Relative: 14 %
Lymphs Abs: 1.4 10*3/uL (ref 0.7–4.0)
MCH: 29.5 pg (ref 26.0–34.0)
MCHC: 32.6 g/dL (ref 30.0–36.0)
MCV: 90.4 fL (ref 80.0–100.0)
Monocytes Absolute: 1 10*3/uL (ref 0.1–1.0)
Monocytes Relative: 11 %
Neutro Abs: 7 10*3/uL (ref 1.7–7.7)
Neutrophils Relative %: 72 %
Platelets: 268 10*3/uL (ref 150–400)
RBC: 3.53 MIL/uL — ABNORMAL LOW (ref 3.87–5.11)
RDW: 13.7 % (ref 11.5–15.5)
WBC: 9.7 10*3/uL (ref 4.0–10.5)
nRBC: 0 % (ref 0.0–0.2)

## 2021-01-26 LAB — BASIC METABOLIC PANEL
Anion gap: 3 — ABNORMAL LOW (ref 5–15)
BUN: 6 mg/dL — ABNORMAL LOW (ref 8–23)
CO2: 39 mmol/L — ABNORMAL HIGH (ref 22–32)
Calcium: 8.6 mg/dL — ABNORMAL LOW (ref 8.9–10.3)
Chloride: 93 mmol/L — ABNORMAL LOW (ref 98–111)
Creatinine, Ser: 0.43 mg/dL — ABNORMAL LOW (ref 0.44–1.00)
GFR, Estimated: >60 mL/min (ref >60)
Glucose, Bld: 88 mg/dL (ref 70–99)
Potassium: 4 mmol/L (ref 3.5–5.1)
Sodium: 135 mmol/L (ref 135–145)

## 2021-01-26 LAB — GLUCOSE, CAPILLARY: Glucose-Capillary: 165 mg/dL — ABNORMAL HIGH (ref 70–99)

## 2021-01-26 MED ORDER — PROCHLORPERAZINE MALEATE 5 MG PO TABS
5.0000 mg | ORAL_TABLET | Freq: Once | ORAL | Status: AC
  Administered 2021-01-26: 5 mg via ORAL
  Filled 2021-01-26: qty 1

## 2021-01-26 MED ORDER — ENSURE ENLIVE PO LIQD
237.0000 mL | Freq: Two times a day (BID) | ORAL | Status: DC
  Administered 2021-01-26 – 2021-01-27 (×2): 237 mL via ORAL

## 2021-01-26 MED ORDER — SODIUM CHLORIDE 0.9 % IV BOLUS
1000.0000 mL | Freq: Once | INTRAVENOUS | Status: AC
  Administered 2021-01-26: 1000 mL via INTRAVENOUS

## 2021-01-26 NOTE — Progress Notes (Signed)
FPTS Interim Progress Note  S: Received page from RN, patient's BP elevated to 214/80. Patient is presently asymptomatic. She reports that she had a headache earlier in the day, but is feeling better now. No vision changes. Confirmed patient has clonidine patch on RUE.  O: BP (!) 214/80 (BP Location: Right Arm)   Pulse 78   Temp 98.5 F (36.9 C) (Oral)   Resp 16   Ht (P) 5\' 3"  (1.6 m)   Wt 41.2 kg   SpO2 100%   BMI (P) 16.09 kg/m   Gen: thin, elderly WW, resting comfortably in bed, NAD Cardiac: RRR, systolic murmur Lungs: CTAB  A/P: Labile blood pressure  Orthostatic hypotension Patient with known labile BP, dropped from 184/62 lying to 115/65 standing earlier today. Labetalol discontinued due to dizziness earlier today. Patient has a pattern of SBP being elevated to 180s-210s in the evenings. As she remains asymptomatic at this time, will continue to monitor. Will hold off on treatment unless BP becomes persistently above 230/120. Discussed with RN. - AM BMP - q4h vitals  [name], MD 01/26/2021, 10:06 PM PGY-2, [place] Medicine Service pager [phone]

## 2021-01-26 NOTE — Progress Notes (Signed)
Pt is upset about oxygen titration during shift report and wants oxygen moved back to at least 2 LPM despite sating at 97% on 1 LPM. Pt states she feels SOB with it lower than 2 LPM. RN spoke with MD on call and was told that it was okay to move oxygen back to 2 LPM.   Eleanora Neighbor, RN

## 2021-01-26 NOTE — Discharge Summary (Signed)
[facility] Discharge Summary  Patient name: [name] [facility] Medical record number: [id number] Date of birth: 08-20-1946 Age: 75 y.o. Gender: female Date of Admission: 01/23/2021  Date of Discharge: 01/27/2021 Admitting Physician: Eulis Foster, MD  Primary Care Provider: Donella Stade, PA-C Consultants: Cardiology  Indication for Hospitalization: Shortness of breath, chest pain  Discharge Diagnoses/Problem List:  Dizziness Orthostatic hypotension Hypertension Dyspnea Chest Pain COPD on oxygen at baseline Hypothyroidism, postsurgical HLD Anemia MDD Anxiety Hx lung cancer  Disposition: Home with [facility] OT  Discharge Condition: Stable  Discharge Exam:  From progress note earlier today: General: Alert and oriented in no apparent distress Heart: Regular rate and rhythm with murmur  Lungs: CTA bilaterally, nasal cannula in place on 2 L Skin: Warm and dry Extremities: No lower extremity edema  Brief Hospital Course:  Britteney Ayotte is a 75 y.o. female presenting with dyspnea. PMH is significant for lung cancer status post lobectomy in 2007, COPD on oxygen at baseline, hypertension, hypothyroidism, major depression disorder, history of anxiety, history of anemia, hyperlipidemia.   Dyspnea Ms. Volden initially presented to the ED with worsening dyspnea throughout the day and chronic chest pain (unchanged from baseline s/p lobectomy). Troponins negative x2 (13, 17). BNP elevated at 274.  Initial EKG is not in the patient's chart but per the ED provider shows some inferior and lateral ST depressions compared to previous EKGs. Chest x-ray shows status post left upper lobectomy with stable left pleural-parenchymal thickening consistent with scarring.  No acute infiltrate.  Patient continues on her home oxygen of 2-3 L. While in the ED, oxygen tank ran out and patient tolerated no O2 for several minutes with normal SpO2 while the nurse was  changing tanks. 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   Orthostatic hypotension and dizziness Patient began complaining of dizziness with standing and ambulation that started the morning after admission. Orthostatics obtained were wildly positive:                          Lying 194/67 pulse 61             Sitting 136/60 pulse 65             Standing at 0 min 110/59 pulse 67             Standing at 3 min 105/54 pulse 72 Med review revealed she is on home meds of clonidine patch 0.3 mg weekly and labetalol 400 mg twice daily. She received multiple gentle fluid boluses and med reductions over several days, with gradual reduction in dizziness and improvement in orthostatic vitals. On day of discharge, orthostatic vitals had improved to 202/76 lying and 161/70 standing but patient experienced no dizziness during these.. Labetalol discontinued, recommend permanent discontinuation.  We initiated chlorthalidone 25 mg.  Clonidine patch reduced to 0.2 mg patch weekly.   Normocytic Anemia Hemoglobin 10.3 on admission, stayed between 9 and 10 throughout admission. Appears to be chronic. Chart review lists iron deficiency anemia on her problem list, although patient is unaware of this diagnosis and not on any iron supplementation at home. Patient cannot remember her last colonoscopy but reports a negative colo-guard screening about one year ago. Denies hemoptysis, hematemesis, hematuria, BRBPR, or dark tarry stool. Iron panel obtained while admitted negative for iron deficiency: iron 33, TIBC 284, ferritin 18. Pathology smear review in process at this time,  pending results.   Other Chronic Conditions Stable This Admission:  COPD on oxygen at baseline Hypertension Hypothyroidism, post-surgical  Hyperlipidemia  Major depressive disorder Anxiety  History of lung cancer  Issues for Follow Up:  1. Hypertension with orthostatic  episodes: Close follow up with PCP. Adjust medications, titrate to standing blood pressure.  Patient was started on chlorthalidone prior to discharge.  Recommend checking kidney function in 1 week. 2. Labetalol discontinued on discharge.  3. Clonidine patch reduced from 0.3 to 0.2 mg weekly.  4. Hypothyroidism: TSH on admit 4.570, markedly increased from 1.54 on 08/28/2020. No changes made to levothyroxine dosage during admission. Suggest recheck of TSH in 4-6 weeks with adjustment as necessary.  5. Cognitive impairment: Patient screened positive for cognitive impairment during hospitalization. Incorrectly numbered clock face and could not set hands to 10 past 11. Recommend clinical correlation outpatient with PCP. 6. Recommended discontinuing of Xanax on discharge.  Significant Procedures: None  Significant Labs and Imaging:  Recent Labs  Lab 01/24/21 0210 01/25/21 0220 01/26/21 0338  WBC 7.5 7.3 9.7  HGB 10.3* 9.1* 10.4*  HCT 32.4* 28.0* 31.9*  PLT 284 257 268   Recent Labs  Lab 01/23/21 1335 01/24/21 0210 01/25/21 0220 01/26/21 0338 01/27/21 0309  NA 135 134* 135 135 133*  K 3.3* 3.4* 3.2* 4.0 3.6  CL 90* 90* 92* 93* 93*  CO2 36* 39* 38* 39* 36*  GLUCOSE 94 92 78 88 91  BUN 16 12 11  6* 6*  CREATININE 0.44 0.51 0.49 0.43* 0.45  CALCIUM 8.9 8.8* 8.4* 8.6* 8.6*  MG  --   --  1.9  --   --   ALKPHOS 75  --   --   --   --   AST 29  --   --   --   --   ALT 18  --   --   --   --   ALBUMIN 3.5  --   --   --   --     CT ANGIOGRAPHY CHEST WITH CONTRAST 01/23/2021 IMPRESSION: No evidence of pulmonary embolism.  Chronic occlusion of LEFT brachiocephalic vein with extensive collateral flow of contrast through the spine, chest wall, azygos, and hemi azygous systems.  COPD changes with postsurgical changes LEFT upper lobe.  Chronic ground-glass opacities in the RIGHT upper lobe unchanged.  Small chronic loculated gas collection in the anterolateral LEFT hemithorax related  to prior surgery.  Aortic Atherosclerosis (ICD10-I70.0) and Emphysema (ICD10-J43.9).  CHEST - 2 VIEW 01/23/2021 IMPRESSION: 1. Patient status post left upper lobectomy with stable left pleural-parenchymal thickening consistent scarring. No acute infiltrate. Reference is made to recent chest CT report for discussion of pulmonary nodules present. 2.  Carotid vascular disease.  Results/Tests Pending at Time of Discharge: Pathologist blood smear review  Discharge Medications:  Allergies as of 01/27/2021      Reactions   Avelox [moxifloxacin] Other (See Comments)   GI upset/dizzy   Desvenlafaxine Succinate Er Other (See Comments)   shaking   Lisinopril-hydrochlorothiazide Nausea And Vomiting, Cough   Nitrofurantoin Monohyd Macro    Norvasc [amlodipine] Other (See Comments)   Lower extremity edema   Penicillins Hives   Zoloft [sertraline Hcl] Other (See Comments)   "too many side effects"      Medication List    STOP taking these medications   ALPRAZolam 1 MG tablet Commonly known as: XANAX   cloNIDine 0.3 mg/24hr patch Commonly known as: CATAPRES - Dosed in mg/24 hr Replaced by: cloNIDine  0.2 mg/24hr patch   labetalol 200 MG tablet Commonly known as: NORMODYNE   predniSONE 20 MG tablet Commonly known as: DELTASONE     TAKE these medications   albuterol 108 (90 Base) MCG/ACT inhaler Commonly known as: VENTOLIN HFA Inhale 2 puffs into the lungs every 6 (six) hours as needed for wheezing or shortness of breath.   aspirin EC 81 MG tablet Take 81 mg by mouth daily.   atorvastatin 20 MG tablet Commonly known as: LIPITOR Take 1 tablet (20 mg total) by mouth at bedtime. Labs for further refills   chlorthalidone 25 MG tablet Commonly known as: HYGROTON Take 1 tablet (25 mg total) by mouth daily.   cloNIDine 0.2 mg/24hr patch Commonly known as: CATAPRES - Dosed in mg/24 hr Place 1 patch (0.2 mg total) onto the skin once a week. Start taking on: February 01, 2021 Replaces: cloNIDine 0.3 mg/24hr patch   ipratropium-albuterol 0.5-2.5 (3) MG/3ML Soln Commonly known as: DUONEB Take 3 mLs by nebulization every 4 (four) hours as needed. What changed: reasons to take this   levothyroxine 50 MCG tablet Commonly known as: SYNTHROID take 1 tablet by mouth once daily What changed:   how much to take  how to take this  when to take this  additional instructions   QUEtiapine 100 MG tablet Commonly known as: SEROQUEL Take 1 tablet (100 mg total) by mouth at bedtime.   Trelegy Ellipta 100-62.5-25 MCG/INH Aepb Generic drug: Fluticasone-Umeclidin-Vilant Inhale 1 puff into the lungs daily.   valsartan 320 MG tablet Commonly known as: DIOVAN Take 1 tablet (320 mg total) by mouth daily.   vortioxetine HBr 20 MG Tabs tablet Commonly known as: TRINTELLIX Take 1 tablet (20 mg total) by mouth daily.       Discharge Instructions: Please refer to Patient Instructions section of EMR for full details.  Patient was counseled important signs and symptoms that should prompt return to medical care, changes in medications, dietary instructions, activity restrictions, and follow up appointments.   Follow-Up Appointments:  Follow-up Information    Donella Stade, PA-C. Go on 01/29/2021.   Specialty: Family Medicine Why: @2pm . This is your hospital follow up appointment with your primary care clinic. If you have concerns after-hours, you may give their main number a call and you will be directed to the after-hours nurse.  Contact information: [place] [address] [phone]        Lelon Perla, MD .   Specialty: Cardiology Contact information: [address] [place] [address] [phone]        Magdalen Spatz, NP. Go on 01/30/2021.   Specialty: Pulmonary Disease Why: @4pm . Your pulmonology follow up.  Contact information: [address] [place]  [address] [phone]               Lurline Del, DO 01/27/2021, 11:20 AM PGY-2, [place]

## 2021-01-26 NOTE — Evaluation (Signed)
Occupational Therapy Evaluation Patient Details Name: [name] MRN: [id number] DOB: 04-17-1946 Today's Date: 01/26/2021    History of Present Illness Pt is a 75 yr old female who presented due to dyspnea. Pt PMH of lung cancer status post lobectomy in 2007, COPD on oxygen at baseline, hypertension, hypothyroidism, major depression disorder, anxiety, anemia, hyperlipidemia.   Clinical Impression   Pt lives with granddaughter who will be able to assist 24/7 with return to home. At prior level of function pt used no AE and was able to cook and complete all ALDS with O2. Pt at this time limited with orthostatics in this session while pt was on o2 at 2 1/L via [place]. (please see bellow) Pt was able to complete bed mobility with modified independence and UE/LE dressing while seated post set up.  Pt currently with functional limitations due to the deficits listed below (see OT Problem List).  Pt will benefit from skilled OT to increase their safety and independence with ADL and functional mobility for ADL to facilitate discharge to venue listed below.   Supine with HOB elevated: 151/56 hr 77 o2 100%  Sitting: 142/60 HR 76 o2 100%  Standing: 93/77 HR 84 o2 99% Supine: 137/63 HR 73 o2 99%       Follow Up Recommendations  Home health OT;Supervision/Assistance - 24 hour    Equipment Recommendations       Recommendations for Other Services       Precautions / Restrictions Precautions Precautions: Fall Precaution Comments: orthostatics Restrictions Weight Bearing Restrictions: No      Mobility Bed Mobility Overal bed mobility: Modified Independent             General bed mobility comments: had HOB elvated due to changes in position    Transfers Overall transfer level: Needs assistance Equipment used: Standard walker Transfers: Sit to/from Stand Sit to Stand: Supervision         General transfer comment: pt cues on pacing self    Balance Overall balance  assessment: Needs assistance Sitting-balance support: Feet supported Sitting balance-Leahy Scale: Good     Standing balance support: Bilateral upper extremity supported Standing balance-Leahy Scale: Fair Standing balance comment: due to feeling of dizziness                           ADL either performed or assessed with clinical judgement   ADL Overall ADL's : Needs assistance/impaired Eating/Feeding: Independent;Sitting   Grooming: Wash/dry hands;Wash/dry face;Sitting;Set up   Upper Body Bathing: Set up;Sitting;Cueing for safety;Cueing for sequencing   Lower Body Bathing: Min guard;Cueing for safety;Cueing for compensatory techniques;Sit to/from stand   Upper Body Dressing : Set up;Cueing for safety;Cueing for sequencing;Sitting   Lower Body Dressing: Set up;Cueing for safety;Cueing for sequencing;Sit to/from stand   Toilet Transfer: Min guard;Cueing for safety;Cueing for sequencing;BSC   Toileting- Water quality scientist and Hygiene: Min guard;Cueing for safety;Cueing for sequencing;Sit to/from stand   Tub/ Shower Transfer: Min guard;Cueing for safety;Cueing for sequencing   Functional mobility during ADLs: Min guard;Cueing for safety;Cueing for sequencing;Rolling walker General ADL Comments: pt at this time limited due to BP for ADLS     Vision Baseline Vision/History: Wears glasses Wears Glasses: At all times Patient Visual Report: Other (comment) (per pt with positional changes did have changes in vision but when they went back into supine no changes in vision)       Perception     Praxis Praxis Praxis tested?: Within functional  limits    Pertinent Vitals/Pain Pain Assessment: Faces Faces Pain Scale: No hurt     Hand Dominance Right   Extremity/Trunk Assessment Upper Extremity Assessment Upper Extremity Assessment: Overall WFL for tasks assessed   Lower Extremity Assessment Lower Extremity Assessment: Defer to PT evaluation        Communication Communication Communication: No difficulties   Cognition Arousal/Alertness: Awake/alert Behavior During Therapy: WFL for tasks assessed/performed Overall Cognitive Status: Within Functional Limits for tasks assessed                                     General Comments       Exercises     Shoulder Instructions      Home Living Family/patient expects to be discharged to:: Private residence Living Arrangements: Other relatives Available Help at Discharge: Family Type of Home: House Home Access: Level entry     Home Layout: Multi-level     Bathroom Shower/Tub: Teacher, early years/pre: Standard Bathroom Accessibility: Yes How Accessible: Accessible via walker Home Equipment: Walker - 2 wheels;Grab bars - tub/shower;Hand held shower head;Bedside commode          Prior Functioning/Environment Level of Independence: Independent with assistive device(s) (with 02)        Comments: per pt they take care of themself and the home at baseline prior to this ED visit        OT Problem List: Decreased activity tolerance;Impaired balance (sitting and/or standing);Decreased strength      OT Treatment/Interventions: Self-care/ADL training;Therapeutic exercise;Therapeutic activities;Patient/family education;Balance training    OT Goals(Current goals can be found in the care plan section) Acute Rehab OT Goals Patient Stated Goal: to be able to walk OT Goal Formulation: With patient Time For Goal Achievement: 02/09/21 Potential to Achieve Goals: Good ADL Goals Pt Will Perform Upper Body Bathing: with modified independence Pt Will Perform Lower Body Bathing: with modified independence;sit to/from stand Pt Will Transfer to Toilet: with modified independence;ambulating;regular height toilet Pt Will Perform Tub/Shower Transfer: with modified independence;ambulating;rolling walker;shower seat  OT Frequency: Min 2X/week   Barriers to D/C:             Co-evaluation              AM-PAC OT "6 Clicks" Daily Activity     Outcome Measure Help from another person eating meals?: None Help from another person taking care of personal grooming?: A Little Help from another person toileting, which includes using toliet, bedpan, or urinal?: A Little Help from another person bathing (including washing, rinsing, drying)?: A Little Help from another person to put on and taking off regular upper body clothing?: A Little Help from another person to put on and taking off regular lower body clothing?: A Little 6 Click Score: 19   End of Session Equipment Utilized During Treatment: Gait belt;Rolling walker Nurse Communication: Other (comment) (orthostatics)  Activity Tolerance: Other (comment) (due to BP and dizziness) Patient left: in bed;with call bell/phone within reach;with bed alarm set  OT Visit Diagnosis: Unsteadiness on feet (R26.81);Other abnormalities of gait and mobility (R26.89);Dizziness and giddiness (R42)                Time: 3295-1884 OT Time Calculation (min): 31 min Charges:  OT General Charges $OT Visit: 1 Visit OT Evaluation $OT Eval Low Complexity: 1 Low OT Treatments $Self Care/Home Management : 8-22 mins  Joeseph Amor OTR/L  Acute  Rehab Services  [phone] office number [phone] pager number   Joeseph Amor 01/26/2021, 8:53 AM

## 2021-01-26 NOTE — Progress Notes (Signed)
BP is 214/80, P 78. RN messaged MD on call to make aware and for further instructions d/t not having PRN or scheduled med to give at this time for hypertension. Awaiting response.   Eleanora Neighbor, RN

## 2021-01-26 NOTE — Progress Notes (Addendum)
Family Medicine Teaching Service Daily Progress Note Intern Pager: [phone]  Patient name: [name] [facility] Medical record number: [id number] Date of birth: Jun 15, 1946 Age: 75 y.o. Gender: female  Primary Care Provider: Donella Stade, PA-C Consultants: None Code Status: DNR  Pt Overview and Major Events to Date:  4/6 admitted  Assessment and Plan: Nicolet Griffy is a 75 y.o. female presenting with dyspnea. PMH is significant for lung cancer status post lobectomy in 2007, COPD on oxygen at baseline, hypertension, hypothyroidism, major depression disorder, history of anxiety, history of anemia, hyperlipidemia.   Dizziness  Hx HTN Patient reports still dizzy when standing and ambulating but that is has improved from yesterday. S/p second 1L bolus. Patient hypertensive overnight with systolics 423N-361W, diastolics 43-15Q. This morning's orthostatic vitals still positive, but improved from yesterday:  Lying 151/56, pulse 77  Sitting 142/60, pulse 76  Standing at 0 min 93/77, pulse 84  Standing at 3 min 137/63, pulse 73 - Awaiting AM orthostatic vitals - 1L NS bolus over 8 hours (rate of 125 mL/hr) -Continue reduced dose of clonidine (0.2 mg patch) - Discontinue labetalol -Continuous cardiac monitoring, continuous pulse ox -Vitals patient routine -PT OT eval and treat - Consult nutrition - Add ensure  Nausea Patients reports being acutely nauseous starting this morning. Unsure of aggrivating factors. Ate breakfast. QTcB from 4/6 538.  - Compazine 5 mg once - If nausea still persists, can consider zofran once   Normocytic anemia Hgb 10.4, stable throughout admission. Iron panel normal.  -A.m. CBC  - f/u smear when resulted   COPD on oxygen at baseline On 2-3 L oxygen at home.  Home meds include albuterol and duo nebs.  SPO2 here 100% on 2.5 L via [place]. -Continuous pulse ox -Continue albuterol and duo nebs per home routine -Wean O2 as tolerated, SPO2 goal for COPD  88-92%   Hypokalemia: A.m. potassium 4.0, no repletion indicated.  -Follow-up a.m. BMP   Hypothyroidism, postsurgical Home medication include Synthroid 50 mcg/day.  Most recent TSH of 1.54 on 08/28/2020.  TSH on admission 4.570.  -Continue home Synthroid 50 mcg/day -Recommend PCP retesting and adjustment outpatient   Hyperlipidemia Continue home atorvastatin.    MDD/History of anxiety Medications include Trintellix, Seroquel, Xanax.  Patient states she takes 1-2 Xanax per week.  PDMP shows 1 prescription for Xanax with 1 week refill filled on: 11/07/20 for 90 count, refill on 01/21/2021.  Patient states she only takes Xanax once or twice per week. -Continue home Trintellix and Seroquel -Hold Xanax   FEN/GI: [place] PPx: Lovenox   Status is: Inpatient  Remains inpatient appropriate because:Inpatient level of care appropriate due to severity of illness   Dispo: The patient is from: Home              Anticipated d/c is to: Home              Patient currently is not medically stable to d/c.   Difficult to place patient No   Subjective:  Patient found laying on her side in bed, awake and alert. Reports acute onset nausea. Ate breakfast without difficulty before hand.  No vomiting at this time. Dizziness improved but still present.   Objective: Temp:  [97.9 F (36.6 C)-98.3 F (36.8 C)] 98.2 F (36.8 C) (04/09 0506) Pulse Rate:  [62-68] 62 (04/09 0506) Resp:  [16-18] 17 (04/09 0506) BP: (184-200)/(62-75) 188/63 (04/09 0506) SpO2:  [99 %-100 %] 99 % (04/09 0506) Weight:  [41.2 kg] 41.2 kg (04/08 2128)  Physical Exam: General: awake, alert, mild distress Respiratory: unlabored respirations, no increased work of breathing, on 2L O2 via [place] Abdomen: non-distended  Laboratory: Recent Labs  Lab 01/24/21 0210 01/25/21 0220 01/26/21 0338  WBC 7.5 7.3 9.7  HGB 10.3* 9.1* 10.4*  HCT 32.4* 28.0* 31.9*  PLT 284 257 268   Recent Labs  Lab 01/23/21 1335 01/24/21 0210  01/25/21 0220 01/26/21 0338  NA 135 134* 135 135  K 3.3* 3.4* 3.2* 4.0  CL 90* 90* 92* 93*  CO2 36* 39* 38* 39*  BUN 16 12 11  6*  CREATININE 0.44 0.51 0.49 0.43*  CALCIUM 8.9 8.8* 8.4* 8.6*  PROT 5.8*  --   --   --   BILITOT 0.8  --   --   --   ALKPHOS 75  --   --   --   ALT 18  --   --   --   AST 29  --   --   --   GLUCOSE 94 92 78 88    Imaging/Diagnostic Tests: None last 24 hours.   Ezequiel Essex, MD 01/26/2021, 8:30 AM PGY-1, [place] Intern pager: [phone], text pages welcome

## 2021-01-26 NOTE — Evaluation (Signed)
Physical Therapy Evaluation Patient Details Name: [name] MRN: [id number] DOB: 11-22-1945 Today's Date: 01/26/2021   History of Present Illness  Pt is a 75 yr old female who presented due to dyspnea. Pt PMH of lung cancer status post lobectomy in 2007, COPD on oxygen at baseline, hypertension, hypothyroidism, major depression disorder, anxiety, anemia, hyperlipidemia.  Clinical Impression  Prior to admission, pt lives with her granddaughter, is a limited community ambulator with no AD, and is independent with ADL's and light IADL's. Pt presents with decreased functional mobility secondary to decreased cardiopulmonary endurance. Pt denying dizziness/lightheadedness during session. Overall, pt moving well, ambulating x 50 feet with no assistive device at a supervision level. SpO2 90% on 1L O2. BP 233/82 post mobility, 234/84 1 minute post mobility, 215/71 5 minutes post mobility; RN/MD made aware. Don't anticipate need for PT follow up once BP adequately controlled. Will continue to follow acutely to progress as tolerated.     Follow Up Recommendations No PT follow up    Equipment Recommendations  None recommended by PT    Recommendations for Other Services       Precautions / Restrictions Precautions Precautions: Fall;Other (comment) Precaution Comments: watch BP Restrictions Weight Bearing Restrictions: No      Mobility  Bed Mobility Overal bed mobility: Modified Independent                  Transfers Overall transfer level: Independent Equipment used: None                Ambulation/Gait Ambulation/Gait assistance: Supervision Gait Distance (Feet): 50 Feet Assistive device: None Gait Pattern/deviations: Step-through pattern;Decreased stride length Gait velocity: decreased   General Gait Details: Slow and steady pace, no evidence of gross instability. Pt self cueing for activity pacing  Stairs            Wheelchair Mobility    Modified  [place] (Stroke Patients Only)       Balance Overall balance assessment: Needs assistance Sitting-balance support: Feet supported Sitting balance-Leahy Scale: Good     Standing balance support: No upper extremity supported Standing balance-Leahy Scale: Fair                               Pertinent Vitals/Pain Pain Assessment: Faces Faces Pain Scale: No hurt    Home Living Family/patient expects to be discharged to:: Private residence Living Arrangements: Other relatives Available Help at Discharge: Family Type of Home: House Home Access: Level entry     Home Layout: Multi-level Home Equipment: Environmental consultant - 2 wheels;Grab bars - tub/shower;Hand held shower head;Bedside commode      Prior Function Level of Independence: Independent         Comments: Limited community ambulator     Hand Dominance   Dominant Hand: Right    Extremity/Trunk Assessment   Upper Extremity Assessment Upper Extremity Assessment: Defer to OT evaluation    Lower Extremity Assessment Lower Extremity Assessment: Overall WFL for tasks assessed       Communication   Communication: No difficulties  Cognition Arousal/Alertness: Awake/alert Behavior During Therapy: WFL for tasks assessed/performed Overall Cognitive Status: Impaired/Different from baseline Area of Impairment: Memory                     Memory: Decreased short-term memory         General Comments: Pt does not recall being in the hospital in December  General Comments      Exercises     Assessment/Plan    PT Assessment Patient needs continued PT services  PT Problem List Decreased strength;Decreased activity tolerance;Decreased balance;Decreased mobility;Cardiopulmonary status limiting activity       PT Treatment Interventions Gait training;Stair training;Functional mobility training;Therapeutic activities;Therapeutic exercise;Balance training;Patient/family education    PT Goals  (Current goals can be found in the Care Plan section)  Acute Rehab PT Goals Patient Stated Goal: to be able to walk PT Goal Formulation: With patient Time For Goal Achievement: 02/09/21 Potential to Achieve Goals: Good    Frequency Min 3X/week   Barriers to discharge        Co-evaluation               AM-PAC PT "6 Clicks" Mobility  Outcome Measure Help needed turning from your back to your side while in a flat bed without using bedrails?: None Help needed moving from lying on your back to sitting on the side of a flat bed without using bedrails?: None Help needed moving to and from a bed to a chair (including a wheelchair)?: None Help needed standing up from a chair using your arms (e.g., wheelchair or bedside chair)?: None Help needed to walk in hospital room?: A Little   6 Click Score: 19    End of Session Equipment Utilized During Treatment: Gait belt;Oxygen Activity Tolerance: Patient tolerated treatment well Patient left: in bed;with call bell/phone within reach Nurse Communication: Mobility status;Other (comment) (elevated BP) PT Visit Diagnosis: Difficulty in walking, not elsewhere classified (R26.2)    Time: 8280-0349 PT Time Calculation (min) (ACUTE ONLY): 25 min   Charges:   PT Evaluation $PT Eval Moderate Complexity: 1 Mod PT Treatments $Therapeutic Activity: 8-22 mins        Wyona Almas, PT, DPT Acute Rehabilitation Services Pager [phone] Office [phone]   Deno Etienne 01/26/2021, 4:22 PM

## 2021-01-27 DIAGNOSIS — E43 Unspecified severe protein-calorie malnutrition: Secondary | ICD-10-CM | POA: Diagnosis not present

## 2021-01-27 DIAGNOSIS — J9611 Chronic respiratory failure with hypoxia: Secondary | ICD-10-CM | POA: Diagnosis not present

## 2021-01-27 DIAGNOSIS — J449 Chronic obstructive pulmonary disease, unspecified: Secondary | ICD-10-CM

## 2021-01-27 DIAGNOSIS — R42 Dizziness and giddiness: Secondary | ICD-10-CM | POA: Diagnosis not present

## 2021-01-27 LAB — BASIC METABOLIC PANEL
Anion gap: 4 — ABNORMAL LOW (ref 5–15)
BUN: 6 mg/dL — ABNORMAL LOW (ref 8–23)
CO2: 36 mmol/L — ABNORMAL HIGH (ref 22–32)
Calcium: 8.6 mg/dL — ABNORMAL LOW (ref 8.9–10.3)
Chloride: 93 mmol/L — ABNORMAL LOW (ref 98–111)
Creatinine, Ser: 0.45 mg/dL (ref 0.44–1.00)
GFR, Estimated: >60 mL/min (ref >60)
Glucose, Bld: 91 mg/dL (ref 70–99)
Potassium: 3.6 mmol/L (ref 3.5–5.1)
Sodium: 133 mmol/L — ABNORMAL LOW (ref 135–145)

## 2021-01-27 MED ORDER — CLONIDINE 0.2 MG/24HR TD PTWK: 0.2000 mg | MEDICATED_PATCH | TRANSDERMAL | 12 refills | Status: DC

## 2021-01-27 MED ORDER — CHLORTHALIDONE 25 MG PO TABS: 25.0000 mg | ORAL_TABLET | Freq: Every day | ORAL | 0 refills | Status: DC

## 2021-01-27 MED ORDER — CHLORTHALIDONE 25 MG PO TABS
25.0000 mg | ORAL_TABLET | Freq: Every day | ORAL | Status: DC
  Administered 2021-01-27: 25 mg via ORAL
  Filled 2021-01-27: qty 1

## 2021-01-27 NOTE — Discharge Instructions (Signed)
I recommend at discharge that you stop your Xanax.  You can have further discussions with your primary care doctor about this but I believe this medication may lead to bad side effects with you so recommend stopping.  We are discharging you on a new blood pressure medication called chlorthalidone.  I recommend that you see your primary care doctor in the next 1 week for blood work to make sure your kidney function and potassium are doing well on this medicine.  I also recommend that you schedule a follow-up with your primary care doctor in 1 week to also see how your symptoms are doing.  Since you missed your pulmonology appointment I do recommend that you schedule follow-up with them.

## 2021-01-27 NOTE — Progress Notes (Addendum)
Family Medicine Teaching Service Daily Progress Note Intern Pager: [phone]  Patient name: [name] [facility] Medical record number: [id number] Date of birth: 1945-12-13 Age: 75 y.o. Gender: female  Primary Care Provider: Donella Stade, PA-C Consultants: None Code Status: DNR  Pt Overview and Major Events to Date:  4/6 admitted  Assessment and Plan: [name] is a 75 y.o. female presenting with dyspnea. PMH is significant for lung cancer status post lobectomy in 2007, COPD on oxygen at baseline, hypertension, hypothyroidism, major depression disorder, history of anxiety, history of anemia, hyperlipidemia.   Dizziness  Hx HTN Patient reports still dizzy when standing and ambulating but that is has improved from yesterday. S/p second 1L bolus. Patient hypertensive overnight with systolics 314-970Y and diastolics in the 63Z-85Y. Awaiting orthostatic vitals this AM. - Awaiting orthostatics this AM -Continue reduced dose of clonidine (0.2 mg patch) - Labetalol discontinued -Continuous cardiac monitoring, continuous pulse ox -Vitals patient routine -PT OT eval and treat - Consult nutrition - Continue ensure -Post rounds addendum: We will initiate chlorthalidone 25 mg/day.  Nausea No complaints of nausea this morning.  QTcB from 4/6 538.  - Compazine 5 mg once - If nausea still persists, can consider zofran once   Normocytic anemia Most recent hgb 10.4, stable throughout admission. Iron panel normal.  -A.m. CBC  - f/u smear when resulted   COPD on oxygen at baseline On 2-3 L oxygen at home.  Home meds include albuterol and duo nebs.    Currently satting 100% on 2 L O2. -Continuous pulse ox -Continue albuterol and duo nebs per home routine -Wean O2 as tolerated, SPO2 goal for COPD 88-92%   Hypokalemia: A.m. potassium 3.6, no repletion indicated.  -Follow-up a.m. BMP   Hypothyroidism, postsurgical Home medication include Synthroid 50 mcg/day.  Most recent TSH  of 1.54 on 08/28/2020.  TSH on admission 4.570.  -Continue home Synthroid 50 mcg/day -Recommend PCP retesting and adjustment outpatient   Hyperlipidemia Continue home atorvastatin.    MDD/History of anxiety Medications include Trintellix, Seroquel, Xanax.  Patient states she takes 1-2 Xanax per week.  PDMP shows 1 prescription for Xanax with 1 week refill filled on: 11/07/20 for 90 count, refill on 01/21/2021.  Patient states she only takes Xanax once or twice per week. -Continue home Trintellix and Seroquel -Hold Xanax   FEN/GI: Regular PPx: Lovenox  Status is: Inpatient  Remains inpatient appropriate because:Inpatient level of care appropriate due to severity of illness   Dispo: The patient is from: Home              Anticipated d/c is to: Home              Patient currently is not medically stable to d/c.   Difficult to place patient No   Subjective:  Patient without complaints this morning but states that she just woke up and has not had enough time to see how she feels.  She states that she is hopeful that she may be able to discharge today.  Objective: Temp:  [98.1 F (36.7 C)-99.4 F (37.4 C)] 98.3 F (36.8 C) (04/10 0536) Pulse Rate:  [63-78] 67 (04/10 0536) Resp:  [16-18] 18 (04/10 0536) BP: (127-214)/(54-103) 201/64 (04/10 0536) SpO2:  [97 %-100 %] 100 % (04/10 0536) Physical Exam: General: Alert and oriented in no apparent distress Heart: Regular rate and rhythm with murmur  Lungs: CTA bilaterally, nasal cannula in place on 2 L Skin: Warm and dry Extremities: No lower extremity edema  Laboratory: Recent Labs  Lab 01/24/21 0210 01/25/21 0220 01/26/21 0338  WBC 7.5 7.3 9.7  HGB 10.3* 9.1* 10.4*  HCT 32.4* 28.0* 31.9*  PLT 284 257 268   Recent Labs  Lab 01/23/21 1335 01/24/21 0210 01/25/21 0220 01/26/21 0338 01/27/21 0309  NA 135   < > 135 135 133*  K 3.3*   < > 3.2* 4.0 3.6  CL 90*   < > 92* 93* 93*  CO2 36*   < > 38* 39* 36*  BUN 16   < > 11  6* 6*  CREATININE 0.44   < > 0.49 0.43* 0.45  CALCIUM 8.9   < > 8.4* 8.6* 8.6*  PROT 5.8*  --   --   --   --   BILITOT 0.8  --   --   --   --   ALKPHOS 75  --   --   --   --   ALT 18  --   --   --   --   AST 29  --   --   --   --   GLUCOSE 94   < > 78 88 91   < > = values in this interval not displayed.    Imaging/Diagnostic Tests: None last 24 hours.   Lurline Del, DO 01/27/2021, 6:55 AM PGY-2, [place] Intern pager: [phone], text pages welcome

## 2021-01-27 NOTE — Progress Notes (Signed)
   01/26/21 2058  Assess: MEWS Score  Temp 98.5 F (36.9 C)  BP (!) 214/80  Pulse Rate 78  Resp 16  SpO2 100 %  O2 Device Room Air  Assess: MEWS Score  MEWS Temp 0  MEWS Systolic 2  MEWS Pulse 0  MEWS RR 0  MEWS LOC 0  MEWS Score 2  MEWS Score Color Yellow  Assess: if the MEWS score is Yellow or Red  Were vital signs taken at a resting state? Yes  Focused Assessment Change from prior assessment (see assessment flowsheet)  Early Detection of Sepsis Score *See Row Information* Low  MEWS guidelines implemented *See Row Information* Yes  Take Vital Signs  Increase Vital Sign Frequency  Yellow: Q 2hr X 2 then Q 4hr X 2, if remains yellow, continue Q 4hrs  Escalate  MEWS: Escalate Yellow: discuss with charge nurse/RN and consider discussing with provider and RRT  Notify: Charge Nurse/RN  Name of Charge Nurse/RN Notified Charito RN  Date Charge Nurse/RN Notified 01/26/21  Time Charge Nurse/RN Notified 2110  Notify: Provider  Provider Name/Title Dr. Jeani Hawking  Date Provider Notified 01/26/21  Time Provider Notified 2110  Notification Type Page  Notification Reason Change in status  Provider response See new orders  Date of Provider Response 01/26/21  Time of Provider Response 2115  Document  Patient Outcome Not stable and remains on department  Progress note created (see row info) Yes

## 2021-01-27 NOTE — Care Management (Signed)
1209 01-27-21 Case Manager spoke with patient regarding home health occupational therapy-patient didn't feel like she needed services. Home Health declined at this time. Patient states she lives with her granddaughter. No further home health needs identified. Graves-Bigelow, Ocie Cornfield, RN, BSN Case Manager

## 2021-01-28 ENCOUNTER — Telehealth: Payer: Self-pay | Admitting: General Practice

## 2021-01-28 LAB — PATHOLOGIST SMEAR REVIEW

## 2021-01-28 NOTE — Telephone Encounter (Signed)
Transition Care Management Unsuccessful Follow-up Telephone Call  Date of discharge and from where:  01/27/21 [facility]  Attempts:  1st Attempt  Reason for unsuccessful TCM follow-up call:  Left voice message

## 2021-01-29 ENCOUNTER — Inpatient Hospital Stay: Payer: Medicare Other | Admitting: Physician Assistant

## 2021-01-30 ENCOUNTER — Other Ambulatory Visit: Payer: Self-pay

## 2021-01-30 ENCOUNTER — Encounter: Payer: Self-pay | Admitting: Acute Care

## 2021-01-30 ENCOUNTER — Ambulatory Visit (INDEPENDENT_AMBULATORY_CARE_PROVIDER_SITE_OTHER): Payer: Medicare Other | Admitting: Acute Care

## 2021-01-30 ENCOUNTER — Ambulatory Visit: Payer: Medicare Other | Admitting: Physician Assistant

## 2021-01-30 VITALS — BP 112/70 | HR 93 | Temp 98.7°F | Ht 63.0 in | Wt 85.0 lb

## 2021-01-30 DIAGNOSIS — R0600 Dyspnea, unspecified: Secondary | ICD-10-CM | POA: Diagnosis not present

## 2021-01-30 DIAGNOSIS — R0609 Other forms of dyspnea: Secondary | ICD-10-CM

## 2021-01-30 DIAGNOSIS — Z8709 Personal history of other diseases of the respiratory system: Secondary | ICD-10-CM | POA: Diagnosis not present

## 2021-01-30 DIAGNOSIS — R918 Other nonspecific abnormal finding of lung field: Secondary | ICD-10-CM | POA: Diagnosis not present

## 2021-01-30 DIAGNOSIS — F419 Anxiety disorder, unspecified: Secondary | ICD-10-CM

## 2021-01-30 DIAGNOSIS — Z87891 Personal history of nicotine dependence: Secondary | ICD-10-CM | POA: Diagnosis not present

## 2021-01-30 DIAGNOSIS — R630 Anorexia: Secondary | ICD-10-CM

## 2021-01-30 NOTE — Telephone Encounter (Signed)
Transition Care Management Unsuccessful Follow-up Telephone Call  Date of discharge and from where:  01/27/21 [facility]  Attempts:  2nd Attempt  Reason for unsuccessful TCM follow-up call:  Left voice message Patient does have a hospital follow up scheduled to see Iran Planas, [place] on 01/31/21 1320

## 2021-01-30 NOTE — Progress Notes (Signed)
History of Present Illness 75 year old female, former smoker (45 pack year hx quit 2006). PMH significant for COPD (FEV1 0.69L), chronic respiratory failure, non-small cell lung cancer s/p LUL wedge resection and seed implant, PE in 2019, HTN, CVA, ischemic heart disease, GERD, postsurgical hypothyroidism, dyslipidemia, memory changes. Patient of Dr. Lamonte Sakai, last seen 07/14/18. Maintained on Trelegy, albuterol and Eliquis. Stopped Brovana during last visit. Uses O2.  Maintained on Trelegy and duoneb BID. She is on Trilogy ventilator at night.  Hospital Follow up 01/30/2021  Date of Admission: 01/23/2021  Date of Discharge: 01/27/2021   Synopsis of hospitalization Pt. Presented to the ED 4/6 with c/o sob and chest tightness for the past 1-2 weeks. Symptoms gradual onset, mild-moderate, constant, waxing/waning, persistent, without acute or abrupt worsening today. No exertional chest pain, dyspnea is worse w exertion. No associated nausea, vomiting or diaphoresis. Denies increased cough. No known ill contacts. No fever or chills. States compliant w home meds/home o2. Denies leg pain or swelling. No pleuritic pain.  She was treated with Albuterol and atrovent tx., CXR was clear, no pneumonia. Labs showed a slightly low K, which was repleted. 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 She was admitted to evaluate for ACS and to better address orthostatic hypotension. . EKG was abnormal, but similar to previous EKG, and cardiology was consulted with no new recommendations.There was also concern this may have been a mild COPD flare.   Patient began complaining of dizziness with standing and ambulation that started the morning after admission. Orthostatics obtained were wildly positive:              Lying 194/67 pulse 61 Sitting 136/60 pulse 65 Standing at 0 min 110/59 pulse  67 Standing at 3 min 105/54 pulse 72 Med review revealed she was on home meds of clonidine patch 0.3 mg weekly and labetalol 400 mg twice daily. She received multiple gentle fluid boluses and med reductions over several days, with gradual reduction in dizziness and improvement in orthostatic vitals. On day of discharge, orthostatic vitals had improved to 202/76 lying and 161/70 standing but patient experienced no dizziness during these.. Labetalol discontinued, recommend permanent discontinuation.  Family Practice  initiated chlorthalidone 25 mg.  Clonidine patch reduced to 0.2 mg patch weekly. She was discharged home after BP medication adjustments.    01/30/2021  Pt. Presents for follow up. She was admitted the above noted dates ( See  Details.)  She states she has been doing well. She is complaint with her Trelegy and Duo Nebs. She is compliant with her Trilogy ventilator at bedtime. She states she has had no further issues with dizziness. She was also noted to have some anemia on admission. Hemoglobin 10.3 on admission, stayed between 9 and 10 throughout admission.She carries a diagnosis of iron deficience anemia, she is on no iron supplementation at home.  [place] daughter is here with patient. She states that patient constantly requests that she turn up her oxygen. He granddaughter checks sats and they are always > 98%, so she is leaving the oxygen at 2 L. She is not active. She is in a wheel chair here today. She states she is usually in a chair at home. She can get up to get herself to the bathroom, but appears dependent on her granddaughter for everything else. This appears to be a failure to thrive situation . I think she is depressed since her  husband has passes away. We discussed that she has some personal accountability in regard to eating daily, and trying to increase her physical activity. . She denies any productive cough. No wheezing.She states she uses her rescue once or twice  daily as needed.   Pt. Weight is 85 pounds. She  Has lost weight since her husband died 2020/07/04. She had been losing weight prior to that but it got significantly worse after her husbands death. She has been using protein shakes . Her granddaughter said she is refusing to eat meat with  few exceptions.She is not taking a daily vitamin . She may benefit from Megace for appetite stimulation.  She is very anxious. She may need some mild anti anxiety.  She was told to stop taking these her last admission. Per her granddaughter she rarely ever took them.   I think she would also benefit from some therapy for depression, and an anti depressant. She is willing to do some virtual therapy.         Test Results: CT Angio 01/23/2021 No evidence of pulmonary embolism.  Chronic occlusion of LEFT brachiocephalic vein with extensive collateral flow of contrast through the spine, chest wall, azygos, and hemi azygous systems.  COPD changes with postsurgical changes LEFT upper lobe.  Chronic ground-glass opacities in the RIGHT upper lobe unchanged.  Small chronic loculated gas collection in the anterolateral LEFT hemithorax related to prior surgery.  Aortic Atherosclerosis (ICD10-I70.0) and Emphysema (ICD10-J43.9).  Echo 10/09/2020 Left ventricular ejection fraction, by estimation, is 55 to 60%. The left ventricle has normal function. The left ventricle has no regional wall motion abnormalities. Left ventricular diastolic parameters are consistent with Grade II diastolic dysfunction (pseudonormalization). Elevated left ventricular end-diastolic pressure. 2. Right ventricular systolic function is normal. The right ventricular size is normal. There is mildly elevated pulmonary artery systolic pressure. 3. Left atrial size was mildly dilated. 4. The mitral valve is normal in structure. Mild mitral valve regurgitation. No evidence of mitral stenosis. 5. Nodular calcification of non coronary  cusp. The aortic valve is tricuspid. Aortic valve regurgitation is not visualized. Mild to moderate aortic valve sclerosis/calcification is present, without any evidence of aortic stenosis. 6. The inferior vena cava is normal in size with greater than 50% respiratory variability, suggesting right atrial pressure of 3 mmHg.   CBC Latest Ref Rng & Units 01/26/2021 01/25/2021 01/24/2021  WBC 4.0 - 10.5 K/uL 9.7 7.3 7.5  Hemoglobin 12.0 - 15.0 g/dL 10.4(L) 9.1(L) 10.3(L)  Hematocrit 36.0 - 46.0 % 31.9(L) 28.0(L) 32.4(L)  Platelets 150 - 400 K/uL 268 257 284    BMP Latest Ref Rng & Units 01/27/2021 01/26/2021 01/25/2021  Glucose 70 - 99 mg/dL 91 88 78  BUN 8 - 23 mg/dL 6(L) 6(L) 11  Creatinine 0.44 - 1.00 mg/dL 0.45 0.43(L) 0.49  BUN/Creat Ratio 6 - 22 (calc) - - -  Sodium 135 - 145 mmol/L 133(L) 135 135  Potassium 3.5 - 5.1 mmol/L 3.6 4.0 3.2(L)  Chloride 98 - 111 mmol/L 93(L) 93(L) 92(L)  CO2 22 - 32 mmol/L 36(H) 39(H) 38(H)  Calcium 8.9 - 10.3 mg/dL 8.6(L) 8.6(L) 8.4(L)    BNP    Component Value Date/Time   BNP 274.2 (H) 01/23/2021 1335   BNP 32 01/26/2018 1346    ProBNP No results found for: PROBNP  PFT No results found for: FEV1PRE, FEV1POST, FVCPRE, FVCPOST, TLC, DLCOUNC, PREFEV1FVCRT, PSTFEV1FVCRT  DG Chest 2 View  Result Date: 01/23/2021 CLINICAL DATA:  Shortness of breath. History of  coronary artery disease, COPD, lung cancer. EXAM: CHEST - 2 VIEW COMPARISON:  Chest x-ray 01/02/2021 01/26/2018. Chest CT 11/02/2020. FINDINGS: Mediastinum is stable. Heart size stable. Patient status post left upper lobectomy with stable left pleuroparenchymal thickening. No acute infiltrate. Reference is made to recent chest CT report for discussion of pulmonary nodules present. No pleural effusion or pneumothorax. Surgical clips left neck. Carotid vascular calcification. Stable postsurgical changes left upper ribs. No acute bony abnormality identified. IMPRESSION: 1. Patient status post left upper  lobectomy with stable left pleural-parenchymal thickening consistent scarring. No acute infiltrate. Reference is made to recent chest CT report for discussion of pulmonary nodules present. 2.  Carotid vascular disease. Electronically Signed   By: Marcello Moores  Register   On: 01/23/2021 14:18   DG Chest 2 View  Result Date: 01/04/2021 CLINICAL DATA:  75 year old female with history of pneumonia. EXAM: CHEST - 2 VIEW COMPARISON:  Chest x-ray 10/01/2020. FINDINGS: Status post left upper lobectomy with compensatory hyperexpansion of the left lower lobe. Chronic pleural thickening calcification in the mid to upper left hemithorax, similar to prior examinations. No definite acute consolidative airspace disease. Trace left pleural effusion which appears chronic. No right pleural effusion. No pneumothorax. No evidence of pulmonary edema. Heart size is normal. Upper mediastinal contours are distorted, similar to prior examination. Atherosclerotic calcifications in the thoracic aorta. Surgical clips project over the left side of the thoracic inlet, potentially from prior hemithyroidectomy. IMPRESSION: 1. Stable radiographic appearance the chest, as above, without evidence of acute cardiopulmonary disease on today's examination. 2. Aortic atherosclerosis. Electronically Signed   By: Vinnie Langton M.D.   On: 01/04/2021 09:20   CT ANGIO CHEST PE W OR WO CONTRAST  Result Date: 01/23/2021 CLINICAL DATA:  Shortness of breath for a few weeks worse this morning, history of lung cancer and resection, COP, hypertension EXAM: CT ANGIOGRAPHY CHEST WITH CONTRAST TECHNIQUE: Multidetector CT imaging of the chest was performed using the standard protocol during bolus administration of intravenous contrast. Multiplanar CT image reconstructions and MIPs were obtained to evaluate the vascular anatomy. CONTRAST:  17mL OMNIPAQUE IOHEXOL 350 MG/ML SOLN IV COMPARISON:  11/02/2020 FINDINGS: Cardiovascular: Atherosclerotic calcifications aorta,  proximal great vessels and coronary arteries. Aorta normal caliber without aneurysm or dissection. Heart unremarkable. No pericardial fusion. Pulmonary arteries adequately opacified and patent. No evidence of pulmonary embolism. Extensive collateral flow of contrast through the spine, chest wall, azygos, and hemi azygous systems due to occlusion of LEFT brachiocephalic vein. Mediastinum/Nodes: Base of cervical region normal appearance. Esophagus unremarkable. Mediastinal shift RIGHT to LEFT. No thoracic adenopathy. Lungs/Pleura: Postsurgical changes LEFT upper lobe. Emphysematous changes consistent with COPD. Overall volume loss in the LEFT hemithorax. Minimal chronic ground-glass opacity in the RIGHT upper lobe, 12 mm diameter, previously 11 mm. Small nodular focus RIGHT upper lobe 7 mm diameter previously 6 mm. No new infiltrate or mass. No pleural effusion or pneumothorax. Small loculated gas collection in the anterolateral LEFT upper hemithorax unchanged. Upper Abdomen: Unremarkable Musculoskeletal: No acute osseous findings. Review of the MIP images confirms the above findings. IMPRESSION: No evidence of pulmonary embolism. Chronic occlusion of LEFT brachiocephalic vein with extensive collateral flow of contrast through the spine, chest wall, azygos, and hemi azygous systems. COPD changes with postsurgical changes LEFT upper lobe. Chronic ground-glass opacities in the RIGHT upper lobe unchanged. Small chronic loculated gas collection in the anterolateral LEFT hemithorax related to prior surgery. Aortic Atherosclerosis (ICD10-I70.0) and Emphysema (ICD10-J43.9). Electronically Signed   By: Lavonia Dana M.D.   On: 01/23/2021  18:27     Past medical hx Past Medical History:  Diagnosis Date  . Acute respiratory failure ([place]) 09/30/2020  . Acute respiratory failure with hypoxia and hypercapnia ([place]) 06/07/2019  . Anxiety as acute reaction to exceptional stress 12/05/2015   GAD-7 was 21. 03/2016   . Athscl heart  disease of native coronary artery w/o ang pctrs 10/21/2019  . 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 CAD (coronary artery disease)    20% stenosis on cath - no intervention required (W-S cards), negative nuclear stress test 11/07  . Cancer [facility]) 2007   lung (Dr. Earlie Server and Dr. Arlyce Dice)  . CAP (community acquired pneumonia) 09/29/2020  . Carotid artery narrowing 02/27/2014  . Cavus foot, acquired 04/28/2017  . Clinical depression 02/27/2014   PHQ-9 was 21. 03/2016.    Marland Kitchen COPD (chronic obstructive pulmonary disease) ([place]) 9/10   golds stage III, FeV1 39%  . COPD exacerbation ([place]) 09/29/2020  . DISEASE, ISCHEMIC HEART, CHRONIC NOS 05/19/2007   Qualifier: Diagnosis of  By: Esmeralda Arthur    . Dyspnea   . Episode of abnormal behavior 11/28/2013  . Exertional chest pain 01/26/2018  . History of hip fracture 09/02/2013   05/2013. S/p THR L    . History of pulmonary embolus (PE) 01/29/2018   Pulmonary embolism  . Hurthle cell adenoma of thyroid 2007  . Hypercapnia 06/28/2019  . Hypertension   . Insomnia 09/04/2013  . Iron deficiency anemia 2/08   s/p 2 unit transfusion  . Left foot pain 02/17/2017  . Lung cancer ([place])   . Memory changes 12/05/2015  . Muscle spasm of back 12/05/2015  . Peripheral vascular disease ([place])   . RLS (restless legs syndrome)   . Systolic murmur 02/58/5277   Echo normal 09/2013. EF 60-65 percent. trivial regurgitation of tricuspid/mitral valve   . Transient cerebral ischemic attack 03/01/2014   Occurred feb 2013.  Managed by Dr. Rema Jasmine. [facility].  On plavix  02/2015 [id number], LICA was 00-86 percent.       Social History   Tobacco Use  . Smoking status: Former Smoker    Packs/day: 1.50    Years: 30.00    Pack years: 45.00    Types: Cigarettes    Start date: 1963    Quit date: 10/20/2004    Years since quitting: 16.2  . Smokeless tobacco: Never Used  Vaping Use  . Vaping Use: Never used  Substance Use Topics  . Alcohol use: No  . Drug use: No     Ms.Makepeace reports that she quit smoking about 16 years ago. Her smoking use included cigarettes. She started smoking about 59 years ago. She has a 45.00 pack-year smoking history. She has never used smokeless tobacco. She reports that she does not drink alcohol and does not use drugs.  Tobacco Cessation: Former smoker , quit 2006 with a 45 pack year smoking history  Past surgical hx, Family hx, Social hx all reviewed.  Current Outpatient Medications on File Prior to Visit  Medication Sig  . albuterol (VENTOLIN HFA) 108 (90 Base) MCG/ACT inhaler Inhale 2 puffs into the lungs every 6 (six) hours as needed for wheezing or shortness of breath.  Marland Kitchen aspirin EC 81 MG tablet Take 81 mg by mouth daily.  Marland Kitchen atorvastatin (LIPITOR) 20 MG tablet Take 1 tablet (20 mg total) by mouth at bedtime. Labs for further refills  . chlorthalidone (HYGROTON) 25 MG tablet Take 1 tablet (25 mg total) by mouth daily.  Derrill Memo ON  02/01/2021] cloNIDine (CATAPRES - DOSED IN MG/24 HR) 0.2 mg/24hr patch Place 1 patch (0.2 mg total) onto the skin once a week.  . Fluticasone-Umeclidin-Vilant (TRELEGY ELLIPTA) 100-62.5-25 MCG/INH AEPB Inhale 1 puff into the lungs daily.  Marland Kitchen ipratropium-albuterol (DUONEB) 0.5-2.5 (3) MG/3ML SOLN Take 3 mLs by nebulization every 4 (four) hours as needed. (Patient taking differently: Take 3 mLs by nebulization every 4 (four) hours as needed (shortness of breath).)  . levothyroxine (SYNTHROID) 50 MCG tablet take 1 tablet by mouth once daily (Patient taking differently: Take 50 mcg by mouth every morning.)  . QUEtiapine (SEROQUEL) 100 MG tablet Take 1 tablet (100 mg total) by mouth at bedtime.  . valsartan (DIOVAN) 320 MG tablet Take 1 tablet (320 mg total) by mouth daily.  Marland Kitchen vortioxetine HBr (TRINTELLIX) 20 MG TABS tablet Take 1 tablet (20 mg total) by mouth daily.   No current facility-administered medications on file prior to visit.     Allergies  Allergen Reactions  . Avelox [Moxifloxacin]  Other (See Comments)    GI upset/dizzy  . Desvenlafaxine Succinate Er Other (See Comments)    shaking  . Lisinopril-Hydrochlorothiazide Nausea And Vomiting and Cough  . Nitrofurantoin Monohyd Macro   . Norvasc [Amlodipine] Other (See Comments)    Lower extremity edema  . Penicillins Hives  . Zoloft [Sertraline Hcl] Other (See Comments)    "too many side effects"    Review Of Systems:  Constitutional:   No  weight loss, night sweats,  Fevers, chills, fatigue, or  lassitude.  HEENT:   No headaches,  Difficulty swallowing,  Tooth/dental problems, or  Sore throat,                No sneezing, itching, ear ache, nasal congestion, post nasal drip,   CV:  No chest pain,  Orthopnea, PND, swelling in lower extremities, anasarca, dizziness, palpitations, syncope.   GI  No heartburn, indigestion, abdominal pain, nausea, vomiting, diarrhea, change in bowel habits, loss of appetite, bloody stools.   Resp: + shortness of breath with exertion or at rest.  No excess mucus, no productive cough,  No non-productive cough,  No coughing up of blood.  No change in color of mucus.  No wheezing.  No chest wall deformity  Skin: no rash or lesions.  GU: no dysuria, change in color of urine, no urgency or frequency.  No flank pain, no hematuria   MS:  No joint pain or swelling.  No decreased range of motion.  No back pain.  Psych:  No change in mood or affect. + depression or anxiety.  No memory loss.   Vital Signs BP 112/70 (BP Location: Left Arm, Cuff Size: Normal)   Pulse 93   Temp 98.7 F (37.1 C) (Temporal)   Ht 5\' 3"  (1.6 m)   Wt 85 lb (38.6 kg)   SpO2 100% Comment: 2.5L  BMI 15.06 kg/m    Physical Exam:  General- No distress,  A&Ox3, pleasant elderly frail female ENT: No sinus tenderness, TM clear, pale nasal mucosa, no oral exudate,no post nasal drip, no LAN Cardiac: S1, S2, regular rate and rhythm, no murmur Chest: No wheeze/ rales/ dullness; no accessory muscle use, no nasal  flaring, no sternal retractions, diminished per bases Abd.: Soft Non-tender, ND, very thin, Body mass index is 15.06 kg/m. Ext: No clubbing cyanosis, edema, + muscle wasting Neuro:  Deconditioned at baseline, MAE x 4, A&O x 3 Skin: No rashes, No lesions, warm and dry Psych: normal mood and behavior  Assessment/Plan  Dyspnea Sats are 97% on 2 L CXR is clear No PE per CTA Suspect anxiety and depression may be playing a role Plan Your oxygen saturations are normal today on your 2.5 L of oxygen. Wear your oxygen at 2 L [place].  Saturation goals are 88-92% Continue Trelegy 1 puff once daily Rinse mouth after use Continue Duo Nebs twice daily as you have been doing. Albuterol as needed for breakthrough shortness of breath or wheezing Consider referral to therapist for management of anxiety. Pt will consider virtual appointments. She will need a follow up CT Chest in 6 months to re-evaluate pulmonary nodule noted on CTA 01/2021.  We will consider referal to cardiology for evaluation if controlling anxiety does not help dyspnea>> but see as inpatient and no new recommendations. Last exho 09/2020 with mild PAH Follow up with Dr. Lamonte Sakai in 3 months >> Please order follow up CT Chest at that time  Depression and Anxiety Husband has recently died Failure to thrive Plan Consider a mild anti anxiety to use only as needed.  Consider referral to therapist for management of anxiety. Pt will consider virtual appointments.   Anemia Plan Follow up with Iran Planas  [place] about iron supplementation for your anemia. Consider a children's vitamin with iron and colace to prevent constipation.  Also can consider Iron infusions  Anorexia Body mass index is 15.06 kg/m.  Most likely related to depression and failure to thrive Plan Continue meal supplements ( Protein daily) Consider Megace for weight gain Virtual therapy for underlying depression and failure to thrive  Magdalen Spatz, NP 01/30/2021   5:07 PM

## 2021-01-30 NOTE — Patient Instructions (Addendum)
It is good to see you today. Your oxygen saturations are normal today on your 2.5 L of oxygen. Wear your oxygen at 2 L [place].  Saturation goals are 88-92% Continue Trelegy 1 puff once daily Rinse mouth after use Continue Duo Nebs twice daily as you have been doing. Albuterol as needed for breakthrough shortness of breath or wheezing Follow up with Iran Planas  [place] about iron supplementation for your anemia. Ask Luvenia Starch about Megace for weight gain. Consider a children's vitamin with iron and colace to prevent constipation.  We will consider referal to cardiology for evaluation if controlling anxiety does not help dyspnea Consider a mild anti anxiety to use only as needed.  Consider referral to therapist for management of anxiety. Pt will consider virtual appointments. Follow up with Dr. Lamonte Sakai in 3 months >> Please order follow up CT Chest at that time Please contact office for sooner follow up if symptoms do not improve or worsen or seek emergency care  She will need a follow up CT Chest in 6 months to re-evaluate pulmonary nodule noted on CTA 01/2021.

## 2021-01-31 ENCOUNTER — Encounter: Payer: Self-pay | Admitting: Physician Assistant

## 2021-01-31 ENCOUNTER — Ambulatory Visit (INDEPENDENT_AMBULATORY_CARE_PROVIDER_SITE_OTHER): Payer: Medicare Other | Admitting: Physician Assistant

## 2021-01-31 VITALS — BP 142/55 | HR 89 | Temp 98.9°F | Resp 20 | Ht 63.0 in | Wt 89.0 lb

## 2021-01-31 DIAGNOSIS — F331 Major depressive disorder, recurrent, moderate: Secondary | ICD-10-CM | POA: Diagnosis not present

## 2021-01-31 DIAGNOSIS — I10 Essential (primary) hypertension: Secondary | ICD-10-CM | POA: Diagnosis not present

## 2021-01-31 DIAGNOSIS — R636 Underweight: Secondary | ICD-10-CM

## 2021-01-31 DIAGNOSIS — R42 Dizziness and giddiness: Secondary | ICD-10-CM

## 2021-01-31 DIAGNOSIS — J449 Chronic obstructive pulmonary disease, unspecified: Secondary | ICD-10-CM

## 2021-01-31 DIAGNOSIS — F418 Other specified anxiety disorders: Secondary | ICD-10-CM | POA: Diagnosis not present

## 2021-01-31 DIAGNOSIS — Z79899 Other long term (current) drug therapy: Secondary | ICD-10-CM

## 2021-01-31 MED ORDER — MEGESTROL ACETATE 400 MG/10ML PO SUSP: 400.0000 mg | Freq: Every day | ORAL | 2 refills | Status: DC

## 2021-01-31 MED ORDER — CLONIDINE 0.1 MG/24HR TD PTWK: 0.1000 mg | MEDICATED_PATCH | TRANSDERMAL | 0 refills | Status: DC

## 2021-01-31 MED ORDER — QUETIAPINE FUMARATE 100 MG PO TABS
100.0000 mg | ORAL_TABLET | Freq: Every day | ORAL | 1 refills | Status: DC
Start: 2021-01-31 — End: 2021-07-26

## 2021-01-31 NOTE — Progress Notes (Signed)
Subjective:    Patient ID: [name], female    DOB: [birth year], 75 y.o.   MRN: [id number]  HPI  Pt is a 75 yo female with severe COPD who is here to follow up from ED after episode of chest discomfort on 4/6//2022.   Date of admission 01/23/2021. Date of discharge 01/27/2021.   Issues for Follow Up:  1. Hypertension with orthostatic episodes: Close follow up with PCP. Adjust medications, titrate to standing blood pressure.  Patient was started on chlorthalidone prior to discharge.  Recommend checking kidney function in 1 week. 2. Labetalol discontinued on discharge.  3. Clonidine patch reduced from 0.3 to 0.2 mg weekly.  4. Hypothyroidism: TSH on admit 4.570, markedly increased from 1.54 on 08/28/2020. No changes made to levothyroxine dosage during admission. Suggest recheck of TSH in 4-6 weeks with adjustment as necessary.  5. Cognitive impairment: Patient screened positive for cognitive impairment during hospitalization. Incorrectly numbered clock face and could not set hands to 10 past 11. Recommend clinical correlation outpatient with PCP. 6. Recommended discontinuing of Xanax on discharge.  She has gained about 3lbs after hospital discharge.   She is stable today. On 2L of O2.   Granddaughter is checking bP and low in morning but raises every afternoon and evening. Not taking chlorthalidone in morning right now because BP has been too low.   .. Active Ambulatory Problems    Diagnosis Date Noted  . History of lung cancer 07/28/2006  . HYPOTHYROIDISM, POSTSURGICAL 05/19/2007  . HYPERCHOLESTEROLEMIA 07/28/2006  . ANEMIA, IRON DEFICIENCY NOS 01/04/2007  . Major depressive disorder, recurrent episode ([place]) 07/28/2006  . Anxiety state 07/28/2006  . PERIPHERAL VASCULAR DISEASE, UNSPEC. 07/28/2006  . Chronic obstructive pulmonary disease ([place]) 07/28/2006  . GERD (gastroesophageal reflux disease) 09/04/2013  . Sensorineural hearing loss of both ears 11/18/2013  . Word  finding difficulty 11/28/2013  . Memory loss 11/28/2013  . Cerebrovascular accident, old 06/14/2013  . IFG (impaired fasting glucose) 04/12/2015  . Osteopenia 04/30/2016  . Chronic pain of left ankle 12/16/2016  . Venous stasis of lower extremity 02/25/2018  . Primary osteoarthritis of both hands 02/25/2018  . Chronic respiratory failure with hypoxia ([place]) 04/02/2018  . Headache, new daily persistent ([facility]) 07/28/2018  . Essential hypertension, benign 07/30/2018  . Anemia of chronic disease 12/20/2018  . RLS (restless legs syndrome) 06/28/2019  . Chronic daily headache 01/06/2020  . Right upper lobe pulmonary nodule 09/29/2020  . Acute on chronic respiratory failure ([place]) 09/30/2020  . Protein-calorie malnutrition, severe 10/02/2020  . Malnutrition of moderate degree ([place]) 12/19/2020  . Unintended weight loss 12/24/2020  . Underweight due to inadequate caloric intake 01/04/2021  . Dyspnea 01/23/2021  . Brachiocephalic vein obstruction with collaterals 01/23/2021  . Dependence on supplemental oxygen 10/21/2019  . Dysphagia, oropharyngeal phase 09/29/2020  . History of falling 10/05/2020  . SOB (shortness of breath) 01/24/2021  . Chest discomfort   . History of COPD   . Orthostatic dizziness 01/25/2021  . Dehydration 01/25/2021  . Pulmonary cachexia due to chronic obstructive pulmonary disease ([place]) 01/27/2021   Resolved Ambulatory Problems    Diagnosis Date Noted  . THRUSH 09/26/2010  . DISEASE, ISCHEMIC HEART, CHRONIC NOS 05/19/2007  . Abdominal bloating 03/04/2011  .  Chronic Bronchopleural fistula   . History of hip fracture 09/02/2013  . Insomnia 09/04/2013  . Systolic murmur 68/09/7516  . Episode of abnormal behavior 11/28/2013  . Transient cerebral ischemic attack 03/01/2014  . Carotid artery narrowing 02/27/2014  . Clinical  depression 02/27/2014  . Elevated blood pressure 10/16/2015  . Split S2 (second heart sound) 10/16/2015  . Anxiety as acute reaction to  exceptional stress 12/05/2015  . Muscle spasm of back 12/05/2015  . Memory changes 12/05/2015  . Left foot pain 02/17/2017  . Cavus foot, acquired 04/28/2017  . Depression with anxiety 08/01/2017  . Lung cancer ([place]) 08/01/2017  . Exertional chest pain 01/26/2018  . History of pulmonary embolus (PE) 01/29/2018  . Hyponatremia 05/04/2019  . Epigastric pain 05/04/2019  . Hypercapnia 06/28/2019  . Acute respiratory distress 06/28/2019  . COPD exacerbation ([place]) 09/29/2020  . Elevated troponin 09/29/2020  . CAP (community acquired pneumonia) 09/29/2020  . Hypokalemia 01/04/2021  . Athscl heart disease of native coronary artery w/o ang pctrs 10/21/2019  . Acute respiratory failure with hypoxia and hypercapnia ([place]) 06/07/2019   Past Medical History:  Diagnosis Date  . Acute respiratory failure ([place]) 09/30/2020  . CAD (coronary artery disease)   . Cancer ([place]) 2007  . COPD (chronic obstructive pulmonary disease) ([place]) 9/10  . Hurthle cell adenoma of thyroid 2007  . Hypertension   . Iron deficiency anemia 2/08  . Peripheral vascular disease ([place])        Review of Systems    see HPI.  Objective:   Physical Exam Vitals reviewed.  Constitutional:      Appearance: She is ill-appearing.  Cardiovascular:     Rate and Rhythm: Normal rate.     Pulses: Normal pulses.  Pulmonary:     Effort: Pulmonary effort is normal.     Breath sounds: Normal breath sounds.     Comments: 2 L of continuous O2. Musculoskeletal:     Right lower leg: No edema.     Left lower leg: No edema.  Neurological:     General: No focal deficit present.     Mental Status: She is alert and oriented to person, place, and time.  Psychiatric:        Mood and Affect: Mood normal.           Assessment & Plan:  Marland KitchenMarland KitchenAhley was seen today for follow-up.  Diagnoses and all orders for this visit:  Essential hypertension, benign -     cloNIDine (CATAPRES - DOSED IN MG/24 HR) 0.1 mg/24hr patch; Place 1 patch  (0.1 mg total) onto the skin once a week.  Depression with anxiety -     QUEtiapine (SEROQUEL) 100 MG tablet; Take 1 tablet (100 mg total) by mouth at bedtime.  Chronic obstructive pulmonary disease, unspecified COPD type (HCC)  Moderate episode of recurrent major depressive disorder ([place])  Medication management -     BASIC METABOLIC PANEL WITH GFR  Underweight due to inadequate caloric intake -     BASIC METABOLIC PANEL WITH GFR -     megestrol (MEGACE) 400 MG/10ML suspension; Take 10 mLs (400 mg total) by mouth daily.  BP is variable.  Keep BP log. Start Chlorathadone in the morning if BP above 105/70 Start Diovan 160mg  (1/2 tablet of what you have) at night.  Decreased clonadine patch to .1mg .  Start megace for appetite.   Recheck BMP.  Follow up in 2 weeks.    Spent 30 minutes with patient and granddaughter reviewing history, plan, medications.

## 2021-01-31 NOTE — Patient Instructions (Addendum)
Keep BP log.  Start Chlorathadone in the morning if BP above 105/70 Start Diovan 160mg  (1/2 tablet of what you have) at night.  Decreased clonadine patch to .1mg .   Start megace for appetite.

## 2021-01-31 NOTE — Telephone Encounter (Signed)
Transition Care Management Unsuccessful Follow-up Telephone Call  Date of discharge and from where:  01/27/2021 [name]  Attempts:  3rd Attempt  Reason for unsuccessful TCM follow-up call:  Unable to reach patient

## 2021-02-01 LAB — BASIC METABOLIC PANEL WITH GFR
BUN/Creatinine Ratio: 64 (calc) — ABNORMAL HIGH (ref 6–22)
BUN: 54 mg/dL — ABNORMAL HIGH (ref 7–25)
CO2: 43 mmol/L — ABNORMAL HIGH (ref 20–32)
Calcium: 9.7 mg/dL (ref 8.6–10.4)
Chloride: 83 mmol/L — ABNORMAL LOW (ref 98–110)
Creat: 0.85 mg/dL (ref 0.60–0.93)
GFR, Est African American: 79 mL/min/{1.73_m2} (ref >=60)
GFR, Est Non African American: 68 mL/min/{1.73_m2} (ref >=60)
Glucose, Bld: 146 mg/dL — ABNORMAL HIGH (ref 65–99)
Potassium: 4.3 mmol/L (ref 3.5–5.3)
Sodium: 134 mmol/L — ABNORMAL LOW (ref 135–146)

## 2021-02-04 ENCOUNTER — Telehealth: Payer: Self-pay | Admitting: Cardiology

## 2021-02-04 ENCOUNTER — Other Ambulatory Visit: Payer: Self-pay | Admitting: Neurology

## 2021-02-04 NOTE — Telephone Encounter (Signed)
4.18.22 LVM on cell phone to [facility] w/Dr Stanford Breed...LParmele

## 2021-02-04 NOTE — Progress Notes (Signed)
[name],   Calcium normal.  BUN is still elevated. How many ounces a day do you think you are drinking?  CO2/Chloride stable.

## 2021-02-05 ENCOUNTER — Telehealth: Payer: Self-pay | Admitting: Physician Assistant

## 2021-02-05 ENCOUNTER — Encounter: Payer: Self-pay | Admitting: Physician Assistant

## 2021-02-05 NOTE — Progress Notes (Signed)
High BUN could be the protein gap patient has right now. I am concerned about the increase all of a sudden. Actual kidney function is good. If not coming down when in office next week will do further work up. Are you having any urinary symptoms?

## 2021-02-05 NOTE — Telephone Encounter (Signed)
Will you touch base with patient and check on BP log and how that is going as well as dizziness.

## 2021-02-06 NOTE — Telephone Encounter (Signed)
Left message on machine for patient to call back to discuss.

## 2021-02-08 NOTE — Telephone Encounter (Signed)
Finally was able to reach patient who states she does feel better, dizziness much better. She does not have BP readings, she asked me to call her granddaughter Glenard Haring to get these readings. Tried to call her at [phone]. LMOM for her to call back with BP readings.    Glenard Haring called back and let me know that before patient was taking Chlorithalidone in the AM her blood pressure readings were 200/69, 190/66. She started giving Chlorithalidone every morning (around 11 am) and Valsartan 320 mg 1/2 tablet at dinner time (between 4-6 pm). Blood pressures have stabilized at around 127/67, 124/67, 128/53 the last three days. Right before Valsartan dosage its been reading around 152/61. Told her to keep doing what she is doing and I will send numbers to [facility] for review.

## 2021-02-15 ENCOUNTER — Ambulatory Visit: Payer: Medicare Other | Admitting: Physician Assistant

## 2021-02-15 ENCOUNTER — Other Ambulatory Visit: Payer: Self-pay

## 2021-02-16 ENCOUNTER — Other Ambulatory Visit: Payer: Self-pay | Admitting: Family Medicine

## 2021-02-17 ENCOUNTER — Other Ambulatory Visit: Payer: Self-pay

## 2021-02-19 ENCOUNTER — Encounter: Payer: Self-pay | Admitting: Physician Assistant

## 2021-02-20 MED ORDER — CHLORTHALIDONE 25 MG PO TABS: 25.0000 mg | ORAL_TABLET | Freq: Every day | ORAL | 1 refills | Status: DC

## 2021-05-08 ENCOUNTER — Other Ambulatory Visit: Payer: Self-pay | Admitting: Physician Assistant

## 2021-05-08 DIAGNOSIS — F418 Other specified anxiety disorders: Secondary | ICD-10-CM

## 2021-05-16 ENCOUNTER — Other Ambulatory Visit: Payer: Self-pay | Admitting: Physician Assistant

## 2021-05-16 DIAGNOSIS — F330 Major depressive disorder, recurrent, mild: Secondary | ICD-10-CM

## 2021-05-24 ENCOUNTER — Other Ambulatory Visit: Payer: Self-pay | Admitting: Physician Assistant

## 2021-05-24 DIAGNOSIS — F418 Other specified anxiety disorders: Secondary | ICD-10-CM

## 2021-07-26 ENCOUNTER — Ambulatory Visit (INDEPENDENT_AMBULATORY_CARE_PROVIDER_SITE_OTHER): Payer: Medicare Other

## 2021-07-26 ENCOUNTER — Other Ambulatory Visit: Payer: Self-pay

## 2021-07-26 ENCOUNTER — Telehealth: Payer: Self-pay

## 2021-07-26 DIAGNOSIS — R918 Other nonspecific abnormal finding of lung field: Secondary | ICD-10-CM | POA: Diagnosis not present

## 2021-07-26 DIAGNOSIS — J449 Chronic obstructive pulmonary disease, unspecified: Secondary | ICD-10-CM

## 2021-07-26 DIAGNOSIS — F418 Other specified anxiety disorders: Secondary | ICD-10-CM

## 2021-07-26 DIAGNOSIS — R0602 Shortness of breath: Secondary | ICD-10-CM

## 2021-07-26 DIAGNOSIS — E890 Postprocedural hypothyroidism: Secondary | ICD-10-CM

## 2021-07-26 DIAGNOSIS — Z85118 Personal history of other malignant neoplasm of bronchus and lung: Secondary | ICD-10-CM

## 2021-07-26 DIAGNOSIS — E782 Mixed hyperlipidemia: Secondary | ICD-10-CM

## 2021-07-26 MED ORDER — QUETIAPINE FUMARATE 100 MG PO TABS: 100.0000 mg | ORAL_TABLET | Freq: Every day | ORAL | 1 refills | Status: DC

## 2021-07-26 MED ORDER — ATORVASTATIN CALCIUM 20 MG PO TABS: 20.0000 mg | ORAL_TABLET | Freq: Every day | ORAL | 1 refills | Status: DC

## 2021-07-26 MED ORDER — LEVOTHYROXINE SODIUM 50 MCG PO TABS: 50.0000 ug | ORAL_TABLET | Freq: Every day | ORAL | 0 refills | Status: DC

## 2021-07-26 MED ORDER — TRELEGY ELLIPTA 100-62.5-25 MCG/INH IN AEPB: 1.0000 | INHALATION_SPRAY | Freq: Every day | RESPIRATORY_TRACT | 0 refills | Status: DC

## 2021-07-26 MED ORDER — IPRATROPIUM-ALBUTEROL 0.5-2.5 (3) MG/3ML IN SOLN: 3.0000 mL | RESPIRATORY_TRACT | 1 refills | Status: DC | PRN

## 2021-07-26 NOTE — Telephone Encounter (Signed)
Refills

## 2021-08-14 ENCOUNTER — Other Ambulatory Visit: Payer: Self-pay

## 2021-08-14 DIAGNOSIS — I10 Essential (primary) hypertension: Secondary | ICD-10-CM

## 2021-08-14 MED ORDER — CLONIDINE 0.1 MG/24HR TD PTWK: 0.1000 mg | MEDICATED_PATCH | TRANSDERMAL | 1 refills | Status: DC

## 2021-09-04 ENCOUNTER — Other Ambulatory Visit: Payer: Self-pay | Admitting: Physician Assistant

## 2021-10-11 ENCOUNTER — Ambulatory Visit (INDEPENDENT_AMBULATORY_CARE_PROVIDER_SITE_OTHER): Payer: Medicare Other | Admitting: Physician Assistant

## 2021-10-11 DIAGNOSIS — Z780 Asymptomatic menopausal state: Secondary | ICD-10-CM | POA: Diagnosis not present

## 2021-10-11 DIAGNOSIS — Z0000 Encounter for general adult medical examination without abnormal findings: Secondary | ICD-10-CM

## 2021-10-11 NOTE — Progress Notes (Signed)
MEDICARE ANNUAL WELLNESS VISIT  10/11/2021  Telephone Visit Disclaimer This Medicare AWV was conducted by telephone due to national recommendations for restrictions regarding the COVID-19 Pandemic (e.g. social distancing).  I verified, using two identifiers, that I am speaking with [name] or their authorized healthcare agent. I discussed the limitations, risks, security, and privacy concerns of performing an evaluation and management service by telephone and the potential availability of an in-person appointment in the future. The patient expressed understanding and agreed to proceed.  Location of Patient: Home Location of Provider (nurse):  In the office  Subjective:    [name] is a 75 y.o. female patient of Donella Stade, PA-C who had a [employer] Visit today via telephone. Jennine is Retired and lives with their family. she has 4 children. she reports that she is socially active and does interact with friends/family regularly. she is minimally physically active and enjoys watching television and spending time with her great grand kids.  Patient Care Team: Lavada Mesi as PCP - General (Family Medicine) Stanford Breed Denice Bors, MD as PCP - Cardiology (Cardiology) Elsie Stain, MD (Pulmonary Disease)  Advanced Directives 10/11/2021 01/23/2021 10/02/2020 04/19/2019  Does Patient Have a Medical Advance Directive? No Yes No No  Type of Advance Directive - [place] in Chart? - No - copy requested, Physician notified - -  Would patient like information on creating a medical advance directive? No - Patient declined - No - Patient declined No - Patient declined    Hospital Utilization Over the Past 12 Months: # of hospitalizations or ER visits: 0 # of surgeries: 0  Review of Systems    Patient reports that her overall health is unchanged compared to last year.  History  obtained from chart review and the patient  Patient Reported Readings (BP, Pulse, CBG, Weight, etc) none  Pain Assessment Pain : No/denies pain     Current Medications & Allergies (verified) Allergies as of 10/11/2021       Reactions   Avelox [moxifloxacin] Other (See Comments)   GI upset/dizzy   Desvenlafaxine Succinate Er Other (See Comments)   shaking   Lisinopril-hydrochlorothiazide Nausea And Vomiting, Cough   Nitrofurantoin Monohyd Macro    Norvasc [amlodipine] Other (See Comments)   Lower extremity edema   Penicillins Hives   Zoloft [sertraline Hcl] Other (See Comments)   "too many side effects"        Medication List        Accurate as of October 11, 2021  2:21 PM. If you have any questions, ask your nurse or doctor.          albuterol 108 (90 Base) MCG/ACT inhaler Commonly known as: VENTOLIN HFA Inhale 2 puffs into the lungs every 6 (six) hours as needed for wheezing or shortness of breath.   aspirin EC 81 MG tablet Take 81 mg by mouth daily.   atorvastatin 20 MG tablet Commonly known as: LIPITOR Take 1 tablet (20 mg total) by mouth at bedtime. Labs for further refills   chlorthalidone 25 MG tablet Commonly known as: HYGROTON TAKE ONE TABLET BY MOUTH EVERY DAY   cloNIDine 0.1 mg/24hr patch Commonly known as: CATAPRES - Dosed in mg/24 hr Place 1 patch (0.1 mg total) onto the skin once a week.   ipratropium-albuterol 0.5-2.5 (3) MG/3ML Soln Commonly known as: DUONEB Take 3 mLs by nebulization every 4 (four) hours  as needed (shortness of breath).   levothyroxine 50 MCG tablet Commonly known as: SYNTHROID Take 1 tablet (50 mcg total) by mouth daily before breakfast. take 1 tablet by mouth once daily   megestrol 400 MG/10ML suspension Commonly known as: MEGACE Take 10 mLs (400 mg total) by mouth daily.   QUEtiapine 100 MG tablet Commonly known as: SEROQUEL Take 1 tablet (100 mg total) by mouth at bedtime.   Trelegy Ellipta 100-62.5-25  MCG/ACT Aepb Generic drug: Fluticasone-Umeclidin-Vilant Inhale 1 puff into the lungs daily.   Trintellix 20 MG Tabs tablet Generic drug: vortioxetine HBr TAKE ONE TABLET BY MOUTH EVERY DAY   valsartan 320 MG tablet Commonly known as: DIOVAN Take 1 tablet (320 mg total) by mouth daily.        History (reviewed): Past Medical History:  Diagnosis Date   Acute respiratory failure ([place]) 09/30/2020   Acute respiratory failure with hypoxia and hypercapnia (HCC) 06/07/2019   Anxiety as acute reaction to exceptional stress 12/05/2015   GAD-7 was 21. 03/2016    Athscl heart disease of native coronary artery w/o ang pctrs 10/21/2019   Bronchopleural fistula (HCC)    CAD (coronary artery disease)    20% stenosis on cath - no intervention required (W-S cards), negative nuclear stress test 11/07   Cancer ([place]) 2007   lung (Dr. Earlie Server and Dr. Arlyce Dice)   CAP (community acquired pneumonia) 09/29/2020   Carotid artery narrowing 02/27/2014   Cavus foot, acquired 04/28/2017   Clinical depression 02/27/2014   PHQ-9 was 21. 03/2016.     COPD (chronic obstructive pulmonary disease) ([place]) 9/10   golds stage III, FeV1 39%   COPD exacerbation ([place]) 09/29/2020   DISEASE, ISCHEMIC HEART, CHRONIC NOS 05/19/2007   Qualifier: Diagnosis of  By: Valetta Close DO, Karen     Dyspnea    Episode of abnormal behavior 11/28/2013   Exertional chest pain 01/26/2018   History of hip fracture 09/02/2013   05/2013. S/p THR L     History of pulmonary embolus (PE) 01/29/2018   Pulmonary embolism   Hurthle cell adenoma of thyroid 2007   Hypercapnia 06/28/2019   Hypertension    Insomnia 09/04/2013   Iron deficiency anemia 2/08   s/p 2 unit transfusion   Left foot pain 02/17/2017   Lung cancer ([place])    Memory changes 12/05/2015   Muscle spasm of back 12/05/2015   Peripheral vascular disease (HCC)    RLS (restless legs syndrome)    Systolic murmur 81/85/6314   Echo normal 09/2013. EF 60-65 percent. trivial regurgitation of  tricuspid/mitral valve    Transient cerebral ischemic attack 03/01/2014   Occurred feb 2013.  Managed by Dr. Rema Jasmine. [facility] healthcare.  On plavix  02/2015 [id number], LICA was 74-12 percent.     Past Surgical History:  Procedure Laterality Date   APPENDECTOMY  18   CHOLECYSTECTOMY  18   heart cartherization     LUL  2005   LUL wedge resection/VATS   LUL  4/08   LUL lobectomy for cystic cavity and Candida, no cancer seen   THYROIDECTOMY, PARTIAL  2007   Dr. Arlyce Dice   Family History  Problem Relation Age of Onset   Cancer Mother        Larynx & Endometrial cancer   Cancer Father        laryngeal cancer   Hypertension Father    Diabetes Sister    Multiple sclerosis Sister    Cancer Sister 62       breast  Social History   Socioeconomic History   Marital status: Widowed    Spouse name: Juanda Crumble   Number of children: 4   Years of education: 10   Highest education level: 10th grade  Occupational History   Occupation: [employer]    Comment: retired  Tobacco Use   Smoking status: Former    Packs/day: 1.50    Years: 30.00    Pack years: 45.00    Types: Cigarettes    Start date: 1963    Quit date: 10/20/2004    Years since quitting: 16.9   Smokeless tobacco: Never  Vaping Use   Vaping Use: Never used  Substance and Sexual Activity   Alcohol use: No   Drug use: No   Sexual activity: Not Currently  Other Topics Concern   Not on file  Social History Narrative   Lives with her 2 grand daughters, one son in law and two great grand kids. Her husband passed away last year. She enjoys spending time with her great grand kids and watching television.   Social Determinants of Health   Financial Resource Strain: Low Risk    Difficulty of Paying Living Expenses: Not hard at all  Food Insecurity: No Food Insecurity   Worried About Charity fundraiser in the Last Year: Never true   [place] in the Last Year: Never true  Transportation Needs: No  Transportation Needs   Lack of Transportation (Medical): No   Lack of Transportation (Non-Medical): No  Physical Activity: Inactive   Days of Exercise per Week: 0 days   Minutes of Exercise per Session: 0 min  Stress: No Stress Concern Present   Feeling of Stress : Not at all  Social Connections: Socially Isolated   Frequency of Communication with Friends and Family: More than three times a week   Frequency of Social Gatherings with Friends and Family: Three times a week   Attends Religious Services: Never   Active Member of Clubs or Organizations: No   Attends Archivist Meetings: Never   Marital Status: Widowed    Activities of Daily Living In your present state of health, do you have any difficulty performing the following activities: 10/11/2021  Hearing? N  Vision? N  Difficulty concentrating or making decisions? N  Walking or climbing stairs? N  Dressing or bathing? N  Doing errands, shopping? N  Preparing Food and eating ? N  Using the Toilet? N  In the past six months, have you accidently leaked urine? N  Do you have problems with loss of bowel control? N  Managing your Medications? N  Managing your Finances? N  Housekeeping or managing your Housekeeping? N  Some recent data might be hidden    Patient Education/ Literacy How often do you need to have someone help you when you read instructions, pamphlets, or other written materials from your doctor or pharmacy?: 1 - Never What is the last grade level you completed in school?: 10th grade  Exercise Current Exercise Habits: The patient does not participate in regular exercise at present, Exercise limited by: None identified  Diet Patient reports consuming  2-3  meals a day and 0 snack(s) a day Patient reports that her primary diet is: Regular Patient reports that she does have regular access to food.   Depression Screen PHQ 2/9 Scores 10/11/2021 01/31/2021 12/18/2020 03/20/2020 12/30/2019 09/20/2019 04/19/2019   PHQ - 2 Score 0 2 5 6 6 5 1   PHQ- 9 Score -  19 14 21 21 18  -     Fall Risk Fall Risk  10/11/2021 03/20/2020 12/30/2019 04/19/2019 09/24/2017  Falls in the past year? 0 0 1 0 No  Comment - - - - Emmi Telephone Survey: data to providers prior to load  Number falls in past yr: 0 0 1 0 -  Injury with Fall? 0 0 1 0 -  Risk for fall due to : No Fall Risks - History of fall(s) - -  Follow up Falls evaluation completed Falls evaluation completed Falls evaluation completed Falls prevention discussed -     Objective:  [name] seemed alert and oriented and she participated appropriately during our telephone visit.  Blood Pressure Weight BMI  BP Readings from Last 3 Encounters:  01/31/21 (!) 142/55  01/30/21 112/70  01/27/21 (!) 201/64   Wt Readings from Last 3 Encounters:  01/31/21 89 lb (40.4 kg)  01/30/21 85 lb (38.6 kg)  01/27/21 94 lb 2.2 oz (42.7 kg)   BMI Readings from Last 1 Encounters:  01/31/21 15.77 kg/m    *Unable to obtain current vital signs, weight, and BMI due to telephone visit type  Hearing/Vision  [name] did not seem to have difficulty with hearing/understanding during the telephone conversation Reports that she has not had a formal eye exam by an eye care professional within the past year Reports that she has not had a formal hearing evaluation within the past year *Unable to fully assess hearing and vision during telephone visit type  Cognitive Function: 6CIT Screen 10/11/2021 04/19/2019  What Year? 0 points 0 points  What month? 0 points 0 points  What time? 0 points 0 points  Count back from 20 0 points 0 points  Months in reverse 4 points 2 points  Repeat phrase 0 points 4 points  Total Score 4 6   (Normal:0-7, Significant for Dysfunction: >8)  Normal Cognitive Function Screening: Yes   Immunization & Health Maintenance Record Immunization History  Administered Date(s) Administered   Fluad Quad(high Dose 65+) 07/20/2020   H1N1 08/21/2008    Influenza Split 09/02/2011, 09/07/2012   Influenza Whole 08/25/2006, 08/19/2007, 08/21/2008, 07/23/2009, 07/31/2010   Influenza, High Dose Seasonal PF 07/30/2016, 06/18/2018   Influenza,inj,Quad PF,6+ Mos 07/20/2013, 08/06/2015, 07/29/2017, 06/21/2019   Influenza-Unspecified 08/21/2008   Moderna Sars-Covid-2 Vaccination 01/03/2020, 01/31/2020   Pneumococcal Conjugate-13 05/11/2014   Pneumococcal Polysaccharide-23 08/25/2006, 11/29/2012   Tdap 03/04/2011, 03/30/2020   Zoster, Live 12/25/2011    Health Maintenance  Topic Date Due   COVID-19 Vaccine (3 - Moderna risk series) 10/27/2021 (Originally 02/28/2020)   MAMMOGRAM  12/18/2021 (Originally 09/22/2020)   Zoster Vaccines- Shingrix (1 of 2) 01/09/2022 (Originally 04/09/1965)   INFLUENZA VACCINE  01/17/2022 (Originally 05/20/2021)   COLONOSCOPY (Pts 45-27yrs Insurance coverage will need to be confirmed)  10/11/2022 (Originally 08/23/2019)   TETANUS/TDAP  03/30/2030   Pneumonia Vaccine 30+ Years old  Completed   DEXA SCAN  Completed   Hepatitis C Screening  Completed   HPV VACCINES  Aged Out       Assessment  This is a routine wellness examination for [name].  Health Maintenance: Due or Overdue There are no preventive care reminders to display for this patient.   [name] does not need a referral for [employer] Assistance: Care Management:   no Social Work:    no Prescription Assistance:  no Nutrition/Diabetes Education:  no   Plan:  Personalized Goals  Goals Addressed  This Visit's Progress     Patient Stated (pt-stated)        10/11/2021 AWV Goal: Improved Nutrition/Diet  Patient will verbalize understanding that diet plays an important role in overall health and that a poor diet is a risk factor for many chronic medical conditions.  Over the next year, patient will improve self management of their diet by incorporating improved protein intake. Patient will utilize available  community resources to help with food acquisition if needed (ex: food pantries, [address], etc) Patient will work with nutrition specialist if a referral was made        Personalized Health Maintenance & Screening Recommendations  Influenza vaccine Screening mammography Bone densitometry screening Colorectal cancer screening Shingrix vaccine  Patient declined the mammogram and colonoscopy referral.   Lung Cancer Screening Recommended: no (Low Dose CT Chest recommended if Age 52-80 years, 30 pack-year currently smoking OR have quit w/in past 15 years) Hepatitis C Screening recommended: no HIV Screening recommended: no  Advanced Directives: Written information was not prepared per patient's request.  Referrals & Orders Orders Placed This Encounter  Procedures   [place]    Follow-up Plan Follow-up with Donella Stade, PA-C as planned Schedule your shingrix vaccine at the pharmacy.  Flu shot can be done in the office or at the pharmacy.  Bone density referral has been sent and they will call you to schedule. Medicare wellness visit in one year. AVS printed and mailed to the patient.    I have personally reviewed and noted the following in the patients chart:   Medical and social history Use of alcohol, tobacco or illicit drugs  Current medications and supplements Functional ability and status Nutritional status Physical activity Advanced directives List of other physicians Hospitalizations, surgeries, and ER visits in previous 12 months Vitals Screenings to include cognitive, depression, and falls Referrals and appointments  In addition, I have reviewed and discussed with [name] certain preventive protocols, quality metrics, and best practice recommendations. A written personalized care plan for preventive services as well as general preventive health recommendations is available and can be mailed to the patient at her request.      Tinnie Gens,  RN  10/11/2021

## 2021-10-11 NOTE — Patient Instructions (Addendum)
[place] Maintenance Summary and Written Plan of Care  [name] ,  Thank you for allowing me to perform your Medicare Annual Wellness Visit and for your ongoing commitment to your health.   Health Maintenance & Immunization History Health Maintenance  Topic Date Due   COVID-19 Vaccine (3 - Moderna risk series) 10/27/2021 (Originally 02/28/2020)   MAMMOGRAM  12/18/2021 (Originally 09/22/2020)   Zoster Vaccines- Shingrix (1 of 2) 01/09/2022 (Originally 04/09/1965)   INFLUENZA VACCINE  01/17/2022 (Originally 05/20/2021)   COLONOSCOPY (Pts 45-86yrs Insurance coverage will need to be confirmed)  10/11/2022 (Originally 08/23/2019)   TETANUS/TDAP  03/30/2030   Pneumonia Vaccine 16+ Years old  Completed   DEXA SCAN  Completed   Hepatitis C Screening  Completed   HPV VACCINES  Aged Out   Immunization History  Administered Date(s) Administered   Fluad Quad(high Dose 65+) 07/20/2020   H1N1 08/21/2008   Influenza Split 09/02/2011, 09/07/2012   Influenza Whole 08/25/2006, 08/19/2007, 08/21/2008, 07/23/2009, 07/31/2010   Influenza, High Dose Seasonal PF 07/30/2016, 06/18/2018   Influenza,inj,Quad PF,6+ Mos 07/20/2013, 08/06/2015, 07/29/2017, 06/21/2019   Influenza-Unspecified 08/21/2008   Moderna Sars-Covid-2 Vaccination 01/03/2020, 01/31/2020   Pneumococcal Conjugate-13 05/11/2014   Pneumococcal Polysaccharide-23 08/25/2006, 11/29/2012   Tdap 03/04/2011, 03/30/2020   Zoster, Live 12/25/2011    These are the patient goals that we discussed:  Goals Addressed               This Visit's Progress     Patient Stated (pt-stated)        10/11/2021 AWV Goal: Improved Nutrition/Diet  Patient will verbalize understanding that diet plays an important role in overall health and that a poor diet is a risk factor for many chronic medical conditions.  Over the next year, patient will improve self management of their diet by incorporating improved protein  intake. Patient will utilize available community resources to help with food acquisition if needed (ex: food pantries, [address], etc) Patient will work with nutrition specialist if a referral was made          This is a list of Health Maintenance Items that are overdue or due now: Influenza vaccine Screening mammography Bone densitometry screening Colorectal cancer screening Shingrix vaccine  Orders/Referrals Placed Today: Orders Placed This Encounter  Procedures   DEXAScan    Standing Status:   Future    Standing Expiration Date:   10/11/2022    Scheduling Instructions:     Please call patient to schedule.    Order Specific Question:   Reason for exam:    Answer:   Post menopausal    Order Specific Question:   Preferred imaging location?    Answer:   [facility]    (Contact our referral department at [phone] if you have not spoken with someone about your referral appointment within the next 5 days)    Follow-up Plan Follow-up with Donella Stade, PA-C as planned Schedule your shingrix vaccine at the pharmacy.  Flu shot can be done in the office or at the pharmacy.  Bone density referral has been sent and they will call you to schedule. Medicare wellness visit in one year. AVS printed and mailed to the patient.       Health Maintenance, Female Adopting a healthy lifestyle and getting preventive care are important in promoting health and wellness. Ask your health care provider about: The right schedule for you to have regular tests and exams. Things you can do on your own to  prevent diseases and keep yourself healthy. What should I know about diet, weight, and exercise? Eat a healthy diet  Eat a diet that includes plenty of vegetables, fruits, low-fat dairy products, and lean protein. Do not eat a lot of foods that are high in solid fats, added sugars, or sodium. Maintain a healthy weight Body mass index (BMI) is used to identify weight problems.  It estimates body fat based on height and weight. Your health care provider can help determine your BMI and help you achieve or maintain a healthy weight. Get regular exercise Get regular exercise. This is one of the most important things you can do for your health. Most adults should: Exercise for at least 150 minutes each week. The exercise should increase your heart rate and make you sweat (moderate-intensity exercise). Do strengthening exercises at least twice a week. This is in addition to the moderate-intensity exercise. Spend less time sitting. Even light physical activity can be beneficial. Watch cholesterol and blood lipids Have your blood tested for lipids and cholesterol at 74 years of age, then have this test every 5 years. Have your cholesterol levels checked more often if: Your lipid or cholesterol levels are high. You are older than 74 years of age. You are at high risk for heart disease. What should I know about cancer screening? Depending on your health history and family history, you may need to have cancer screening at various ages. This may include screening for: Breast cancer. Cervical cancer. Colorectal cancer. Skin cancer. Lung cancer. What should I know about heart disease, diabetes, and high blood pressure? Blood pressure and heart disease High blood pressure causes heart disease and increases the risk of stroke. This is more likely to develop in people who have high blood pressure readings or are overweight. Have your blood pressure checked: Every 3-5 years if you are 67-7 years of age. Every year if you are 51 years old or older. Diabetes Have regular diabetes screenings. This checks your fasting blood sugar level. Have the screening done: Once every three years after age 1 if you are at a normal weight and have a low risk for diabetes. More often and at a younger age if you are overweight or have a high risk for diabetes. What should I know about preventing  infection? Hepatitis B If you have a higher risk for hepatitis B, you should be screened for this virus. Talk with your health care provider to find out if you are at risk for hepatitis B infection. Hepatitis C Testing is recommended for: Everyone born from [birth year] through 1964. Anyone with known risk factors for hepatitis C. Sexually transmitted infections (STIs) Get screened for STIs, including gonorrhea and chlamydia, if: You are sexually active and are younger than 74 years of age. You are older than 74 years of age and your health care provider tells you that you are at risk for this type of infection. Your sexual activity has changed since you were last screened, and you are at increased risk for chlamydia or gonorrhea. Ask your health care provider if you are at risk. Ask your health care provider about whether you are at high risk for HIV. Your health care provider may recommend a prescription medicine to help prevent HIV infection. If you choose to take medicine to prevent HIV, you should first get tested for HIV. You should then be tested every 3 months for as long as you are taking the medicine. Pregnancy If you are about to stop having  your period (premenopausal) and you may become pregnant, seek counseling before you get pregnant. Take 400 to 800 micrograms (mcg) of folic acid every day if you become pregnant. Ask for birth control (contraception) if you want to prevent pregnancy. Osteoporosis and menopause Osteoporosis is a disease in which the bones lose minerals and strength with aging. This can result in bone fractures. If you are 67 years old or older, or if you are at risk for osteoporosis and fractures, ask your health care provider if you should: Be screened for bone loss. Take a calcium or vitamin D supplement to lower your risk of fractures. Be given hormone replacement therapy (HRT) to treat symptoms of menopause. Follow these instructions at home: Alcohol use Do not  drink alcohol if: Your health care provider tells you not to drink. You are pregnant, may be pregnant, or are planning to become pregnant. If you drink alcohol: Limit how much you have to: 0-1 drink a day. Know how much alcohol is in your drink. In the [place] one drink equals one 12 oz bottle of beer (355 mL), one 5 oz glass of wine (148 mL), or one 1 oz glass of hard liquor (44 mL). Lifestyle Do not use any products that contain nicotine or tobacco. These products include cigarettes, chewing tobacco, and vaping devices, such as e-cigarettes. If you need help quitting, ask your health care provider. Do not use street drugs. Do not share needles. Ask your health care provider for help if you need support or information about quitting drugs. General instructions Schedule regular health, dental, and eye exams. Stay current with your vaccines. Tell your health care provider if: You often feel depressed. You have ever been abused or do not feel safe at home. Summary Adopting a healthy lifestyle and getting preventive care are important in promoting health and wellness. Follow your health care provider's instructions about healthy diet, exercising, and getting tested or screened for diseases. Follow your health care provider's instructions on monitoring your cholesterol and blood pressure. This information is not intended to replace advice given to you by your health care provider. Make sure you discuss any questions you have with your health care provider. Document Revised: 02/25/2021 Document Reviewed: 02/25/2021 Elsevier Patient Education  [place].

## 2021-12-05 ENCOUNTER — Other Ambulatory Visit: Payer: Self-pay

## 2021-12-05 DIAGNOSIS — F330 Major depressive disorder, recurrent, mild: Secondary | ICD-10-CM

## 2021-12-05 DIAGNOSIS — I10 Essential (primary) hypertension: Secondary | ICD-10-CM

## 2021-12-05 DIAGNOSIS — E782 Mixed hyperlipidemia: Secondary | ICD-10-CM

## 2021-12-05 DIAGNOSIS — E890 Postprocedural hypothyroidism: Secondary | ICD-10-CM

## 2021-12-05 DIAGNOSIS — F418 Other specified anxiety disorders: Secondary | ICD-10-CM

## 2021-12-05 MED ORDER — CLONIDINE 0.1 MG/24HR TD PTWK: 0.1000 mg | MEDICATED_PATCH | TRANSDERMAL | 3 refills | Status: DC

## 2021-12-05 MED ORDER — VORTIOXETINE HBR 20 MG PO TABS: 20.0000 mg | ORAL_TABLET | Freq: Every day | ORAL | 3 refills | Status: DC

## 2021-12-05 MED ORDER — QUETIAPINE FUMARATE 100 MG PO TABS: 100.0000 mg | ORAL_TABLET | Freq: Every day | ORAL | 3 refills | Status: DC

## 2021-12-05 MED ORDER — VALSARTAN 320 MG PO TABS: 320.0000 mg | ORAL_TABLET | Freq: Every day | ORAL | 3 refills | Status: DC

## 2021-12-05 MED ORDER — CHLORTHALIDONE 25 MG PO TABS: 25.0000 mg | ORAL_TABLET | Freq: Every day | ORAL | 3 refills | Status: DC

## 2021-12-05 MED ORDER — ATORVASTATIN CALCIUM 20 MG PO TABS: 20.0000 mg | ORAL_TABLET | Freq: Every day | ORAL | 1 refills | Status: DC

## 2021-12-05 MED ORDER — TRELEGY ELLIPTA 100-62.5-25 MCG/ACT IN AEPB: 1.0000 | INHALATION_SPRAY | Freq: Every day | RESPIRATORY_TRACT | 3 refills | Status: DC

## 2021-12-05 MED ORDER — LEVOTHYROXINE SODIUM 50 MCG PO TABS: 50.0000 ug | ORAL_TABLET | Freq: Every day | ORAL | 3 refills | Status: DC

## 2021-12-12 ENCOUNTER — Other Ambulatory Visit: Payer: Self-pay

## 2021-12-12 DIAGNOSIS — E890 Postprocedural hypothyroidism: Secondary | ICD-10-CM

## 2021-12-12 DIAGNOSIS — F330 Major depressive disorder, recurrent, mild: Secondary | ICD-10-CM

## 2021-12-12 DIAGNOSIS — F418 Other specified anxiety disorders: Secondary | ICD-10-CM

## 2021-12-12 MED ORDER — QUETIAPINE FUMARATE 100 MG PO TABS: 100.0000 mg | ORAL_TABLET | Freq: Every day | ORAL | 0 refills | Status: DC

## 2021-12-12 MED ORDER — VORTIOXETINE HBR 20 MG PO TABS: 20.0000 mg | ORAL_TABLET | Freq: Every day | ORAL | 0 refills | Status: DC

## 2021-12-12 MED ORDER — LEVOTHYROXINE SODIUM 50 MCG PO TABS: 50.0000 ug | ORAL_TABLET | Freq: Every day | ORAL | 0 refills | Status: DC

## 2022-01-06 ENCOUNTER — Ambulatory Visit: Payer: Medicare Other | Admitting: Physician Assistant

## 2022-01-06 DIAGNOSIS — I10 Essential (primary) hypertension: Secondary | ICD-10-CM

## 2022-01-06 DIAGNOSIS — E890 Postprocedural hypothyroidism: Secondary | ICD-10-CM

## 2022-01-06 DIAGNOSIS — E782 Mixed hyperlipidemia: Secondary | ICD-10-CM

## 2022-01-06 DIAGNOSIS — Z79899 Other long term (current) drug therapy: Secondary | ICD-10-CM

## 2022-01-10 ENCOUNTER — Other Ambulatory Visit: Payer: Self-pay

## 2022-01-10 ENCOUNTER — Ambulatory Visit (INDEPENDENT_AMBULATORY_CARE_PROVIDER_SITE_OTHER): Payer: Medicare Other | Admitting: Physician Assistant

## 2022-01-10 VITALS — BP 155/61 | HR 76 | Ht 63.0 in | Wt 83.0 lb

## 2022-01-10 DIAGNOSIS — E890 Postprocedural hypothyroidism: Secondary | ICD-10-CM | POA: Diagnosis not present

## 2022-01-10 DIAGNOSIS — E782 Mixed hyperlipidemia: Secondary | ICD-10-CM

## 2022-01-10 DIAGNOSIS — I10 Essential (primary) hypertension: Secondary | ICD-10-CM | POA: Diagnosis not present

## 2022-01-10 DIAGNOSIS — F418 Other specified anxiety disorders: Secondary | ICD-10-CM

## 2022-01-10 DIAGNOSIS — Z23 Encounter for immunization: Secondary | ICD-10-CM

## 2022-01-10 DIAGNOSIS — R7301 Impaired fasting glucose: Secondary | ICD-10-CM | POA: Diagnosis not present

## 2022-01-10 DIAGNOSIS — E538 Deficiency of other specified B group vitamins: Secondary | ICD-10-CM

## 2022-01-10 DIAGNOSIS — F515 Nightmare disorder: Secondary | ICD-10-CM

## 2022-01-10 DIAGNOSIS — E7800 Pure hypercholesterolemia, unspecified: Secondary | ICD-10-CM

## 2022-01-10 DIAGNOSIS — Z79899 Other long term (current) drug therapy: Secondary | ICD-10-CM

## 2022-01-10 DIAGNOSIS — R630 Anorexia: Secondary | ICD-10-CM

## 2022-01-10 DIAGNOSIS — F330 Major depressive disorder, recurrent, mild: Secondary | ICD-10-CM

## 2022-01-10 MED ORDER — QUETIAPINE FUMARATE 100 MG PO TABS: 100.0000 mg | ORAL_TABLET | Freq: Every day | ORAL | 1 refills | Status: DC

## 2022-01-10 MED ORDER — MEGESTROL ACETATE 40 MG PO TABS: 40.0000 mg | ORAL_TABLET | Freq: Three times a day (TID) | ORAL | 1 refills | Status: DC

## 2022-01-10 MED ORDER — LEVOTHYROXINE SODIUM 50 MCG PO TABS: 50.0000 ug | ORAL_TABLET | Freq: Every day | ORAL | 0 refills | Status: DC

## 2022-01-10 MED ORDER — PRAZOSIN HCL 1 MG PO CAPS: 1.0000 mg | ORAL_CAPSULE | Freq: Every day | ORAL | 0 refills | Status: DC

## 2022-01-10 MED ORDER — VORTIOXETINE HBR 20 MG PO TABS: 20.0000 mg | ORAL_TABLET | Freq: Every day | ORAL | 3 refills | Status: DC

## 2022-01-10 NOTE — Progress Notes (Signed)
? ?Subjective:  ? ? Patient ID: [name], female    DOB: 01-06-1946, 76 y.o.   MRN: [id number] ? ?HPI ?Pt is a 76 yo female who presents to the clinic with her granddaughter for medication refills and follow up.  ? ?Granddaughters biggest concern is lack of appetite and depressed mood. She has to really push her to eat all all. She will eat oatmeal in the morning but throughout the day in dinner she does not want anything. She drinks boost from time to time but not regularly.  ? ?Pt is on O2 continuously. Her breathing is pretty stable. No significant concerns.  ? ?Not checking BP at home regularly. No concerns with headache. ? ?Granddaughter concerned with night mares that patient is waking up to a out death of her husband. Pt does not remember them. It is disturbing her sleep. She does not have any hobbies and stays at home most of the time. No SI/HC.  ? ?Her granddaughter is making sure she is taking all of her medications.  ? ? ? ?.. ?Active Ambulatory Problems  ?  Diagnosis Date Noted  ? History of lung cancer 07/28/2006  ? HYPOTHYROIDISM, POSTSURGICAL 05/19/2007  ? HYPERCHOLESTEROLEMIA 07/28/2006  ? ANEMIA, IRON DEFICIENCY NOS 01/04/2007  ? Major depressive disorder, recurrent episode ([place]) 07/28/2006  ? Anxiety state 07/28/2006  ? PERIPHERAL VASCULAR DISEASE, UNSPEC. 07/28/2006  ? Chronic obstructive pulmonary disease ([place]) 07/28/2006  ? GERD (gastroesophageal reflux disease) 09/04/2013  ? Sensorineural hearing loss of both ears 11/18/2013  ? Word finding difficulty 11/28/2013  ? Memory loss 11/28/2013  ? Cerebrovascular accident, old 06/14/2013  ? IFG (impaired fasting glucose) 04/12/2015  ? Osteopenia 04/30/2016  ? Chronic pain of left ankle 12/16/2016  ? Venous stasis of lower extremity 02/25/2018  ? Primary osteoarthritis of both hands 02/25/2018  ? Chronic respiratory failure with hypoxia ([place]) 04/02/2018  ? Headache, new daily persistent ([facility]) 07/28/2018  ? Essential hypertension, benign  07/30/2018  ? Anemia of chronic disease 12/20/2018  ? RLS (restless legs syndrome) 06/28/2019  ? Chronic daily headache 01/06/2020  ? Right upper lobe pulmonary nodule 09/29/2020  ? Acute on chronic respiratory failure ([place]) 09/30/2020  ? Protein-calorie malnutrition, severe 10/02/2020  ? Malnutrition of moderate degree ([place]) 12/19/2020  ? Unintended weight loss 12/24/2020  ? Underweight due to inadequate caloric intake 01/04/2021  ? Dyspnea 01/23/2021  ? Brachiocephalic vein obstruction with collaterals 01/23/2021  ? Dependence on supplemental oxygen 10/21/2019  ? Dysphagia, oropharyngeal phase 09/29/2020  ? History of falling 10/05/2020  ? SOB (shortness of breath) 01/24/2021  ? Chest discomfort   ? History of COPD   ? Orthostatic dizziness 01/25/2021  ? Dehydration 01/25/2021  ? Pulmonary cachexia due to chronic obstructive pulmonary disease ([place]) 01/27/2021  ? ?Resolved Ambulatory Problems  ?  Diagnosis Date Noted  ? THRUSH 09/26/2010  ? DISEASE, ISCHEMIC HEART, CHRONIC NOS 05/19/2007  ? Abdominal bloating 03/04/2011  ?  Chronic Bronchopleural fistula   ? History of hip fracture 09/02/2013  ? Insomnia 09/04/2013  ? Systolic murmur 48/54/6270  ? Episode of abnormal behavior 11/28/2013  ? Transient cerebral ischemic attack 03/01/2014  ? Carotid artery narrowing 02/27/2014  ? Clinical depression 02/27/2014  ? Elevated blood pressure 10/16/2015  ? Split S2 (second heart sound) 10/16/2015  ? Anxiety as acute reaction to exceptional stress 12/05/2015  ? Muscle spasm of back 12/05/2015  ? Memory changes 12/05/2015  ? Left foot pain 02/17/2017  ? Cavus foot, acquired 04/28/2017  ? Depression  with anxiety 08/01/2017  ? Lung cancer ([place]) 08/01/2017  ? Exertional chest pain 01/26/2018  ? History of pulmonary embolus (PE) 01/29/2018  ? Hyponatremia 05/04/2019  ? Epigastric pain 05/04/2019  ? Hypercapnia 06/28/2019  ? Acute respiratory distress 06/28/2019  ? COPD exacerbation ([place]) 09/29/2020  ? Elevated troponin 09/29/2020   ? CAP (community acquired pneumonia) 09/29/2020  ? Hypokalemia 01/04/2021  ? Athscl heart disease of native coronary artery w/o ang pctrs 10/21/2019  ? Acute respiratory failure with hypoxia and hypercapnia ([place]) 06/07/2019  ? ?Past Medical History:  ?Diagnosis Date  ? Acute respiratory failure ([place]) 09/30/2020  ? CAD (coronary artery disease)   ? Cancer [facility]) 2007  ? COPD (chronic obstructive pulmonary disease) ([place]) 9/10  ? Hurthle cell adenoma of thyroid 2007  ? Hypertension   ? Iron deficiency anemia 2/08  ? Peripheral vascular disease ([place])   ? ? ? ?Review of Systems ?See HPI.  ?   ?Objective:  ? Physical Exam ? ? ? ?.. ? ?  01/10/2022  ?  1:52 PM 10/11/2021  ?  2:06 PM 01/31/2021  ?  2:15 PM 12/18/2020  ?  4:33 PM 03/20/2020  ?  4:06 PM  ?Depression screen PHQ 2/9  ?Decreased Interest 3 0 0 3 3  ?Down, Depressed, Hopeless 3 0 2 2 3   ?PHQ - 2 Score 6 0 2 5 6   ?Altered sleeping 3  3 0 1  ?Tired, decreased energy 3  3 0 3  ?Change in appetite 3  3 3 3   ?Feeling bad or failure about yourself  3  3 2 3   ?Trouble concentrating 1  2 1 2   ?Moving slowly or fidgety/restless 0  3 3 3   ?Suicidal thoughts 0  0 0 0  ?PHQ-9 Score 19  19 14 21   ?Difficult doing work/chores Very difficult  Somewhat difficult Not difficult at all Very difficult  ? ?.. ? ?  01/10/2022  ?  1:53 PM 01/31/2021  ?  2:16 PM 12/18/2020  ?  4:33 PM 03/20/2020  ?  4:07 PM  ?GAD 7 : Generalized Anxiety Score  ?Nervous, Anxious, on Edge 2 3 3 3   ?Control/stop worrying 3 2 3 3   ?Worry too much - different things 3 2 3 3   ?Trouble relaxing 3 2 1 3   ?Restless 3 2 1 2   ?Easily annoyed or irritable 3 3 3 3   ?Afraid - awful might happen 3 3 3 3   ?Total GAD 7 Score 20 17 17 20   ?Anxiety Difficulty Very difficult Very difficult Not difficult at all Very difficult  ? ? ? ?   ?Assessment & Plan:  ?..Monica was seen today for follow-up. ? ?Diagnoses and all orders for this visit: ? ?Essential hypertension, benign ?-     COMPLETE METABOLIC PANEL WITH GFR ? ?HYPOTHYROIDISM,  POSTSURGICAL ?-     TSH ?-     levothyroxine (SYNTHROID) 50 MCG tablet; Take 1 tablet (50 mcg total) by mouth daily before breakfast. take 1 tablet by mouth once daily ? ?IFG (impaired fasting glucose) ?-     COMPLETE METABOLIC PANEL WITH GFR ? ?HYPERCHOLESTEROLEMIA ?-     Lipid Panel w/reflex Direct LDL ? ?Mixed hyperlipidemia ?-     Lipid Panel w/reflex Direct LDL ? ?Medication management ?-     TSH ?-     Lipid Panel w/reflex Direct LDL ?-     COMPLETE METABOLIC PANEL WITH GFR ?-     CBC with Differential/Platelet ?-  Vitamin B12 ? ?B12 deficiency ?-     Vitamin B12 ? ?Mild episode of recurrent major depressive disorder (HCC) ?-     vortioxetine HBr (TRINTELLIX) 20 MG TABS tablet; Take 1 tablet (20 mg total) by mouth daily. ? ?Depression with anxiety ?-     QUEtiapine (SEROQUEL) 100 MG tablet; Take 1 tablet (100 mg total) by mouth at bedtime. ? ?Nightmares ?-     prazosin (MINIPRESS) 1 MG capsule; Take 1 capsule (1 mg total) by mouth at bedtime. ? ?Need for influenza vaccination ?-     Flu vaccine HIGH DOSE PF (Fluzone High dose) ? ?No appetite ?-     megestrol (MEGACE) 40 MG tablet; Take 1 tablet (40 mg total) by mouth in the morning, at noon, and at bedtime. ? ? ?Need labs for medication management ?Megace liquid is traded for tablet for appetite increase ?Prazosin started for nightmares. ?Refilled mood medication. ? ? ?

## 2022-01-11 LAB — LIPID PANEL W/REFLEX DIRECT LDL
Cholesterol: 165 mg/dL (ref <200)
HDL: 71 mg/dL (ref >=50)
LDL Cholesterol (Calc): 80 mg/dL (calc)
Non-HDL Cholesterol (Calc): 94 mg/dL (calc) (ref <130)
Total CHOL/HDL Ratio: 2.3 (calc) (ref <5.0)
Triglycerides: 67 mg/dL (ref <150)

## 2022-01-11 LAB — COMPLETE METABOLIC PANEL WITH GFR
AG Ratio: 1.2 (calc) (ref 1.0–2.5)
ALT: 14 U/L (ref 6–29)
AST: 19 U/L (ref 10–35)
Albumin: 3.9 g/dL (ref 3.6–5.1)
Alkaline phosphatase (APISO): 87 U/L (ref 37–153)
BUN/Creatinine Ratio: 42 (calc) — ABNORMAL HIGH (ref 6–22)
BUN: 26 mg/dL — ABNORMAL HIGH (ref 7–25)
CO2: 37 mmol/L — ABNORMAL HIGH (ref 20–32)
Calcium: 10 mg/dL (ref 8.6–10.4)
Chloride: 93 mmol/L — ABNORMAL LOW (ref 98–110)
Creat: 0.62 mg/dL (ref 0.60–1.00)
Globulin: 3.3 g/dL (calc) (ref 1.9–3.7)
Glucose, Bld: 106 mg/dL — ABNORMAL HIGH (ref 65–99)
Potassium: 4.5 mmol/L (ref 3.5–5.3)
Sodium: 139 mmol/L (ref 135–146)
Total Bilirubin: 0.4 mg/dL (ref 0.2–1.2)
Total Protein: 7.2 g/dL (ref 6.1–8.1)
eGFR: 93 mL/min/{1.73_m2} (ref >=60)

## 2022-01-11 LAB — CBC WITH DIFFERENTIAL/PLATELET
Absolute Monocytes: 737 cells/uL (ref 200–950)
Basophils Absolute: 49 cells/uL (ref 0–200)
Basophils Relative: 0.6 %
Eosinophils Absolute: 405 cells/uL (ref 15–500)
Eosinophils Relative: 5 %
HCT: 26.9 % — ABNORMAL LOW (ref 35.0–45.0)
Hemoglobin: 8.7 g/dL — ABNORMAL LOW (ref 11.7–15.5)
Lymphs Abs: 1175 cells/uL (ref 850–3900)
MCH: 30.1 pg (ref 27.0–33.0)
MCHC: 32.3 g/dL (ref 32.0–36.0)
MCV: 93.1 fL (ref 80.0–100.0)
MPV: 10.7 fL (ref 7.5–12.5)
Monocytes Relative: 9.1 %
Neutro Abs: 5735 cells/uL (ref 1500–7800)
Neutrophils Relative %: 70.8 %
Platelets: 378 10*3/uL (ref 140–400)
RBC: 2.89 10*6/uL — ABNORMAL LOW (ref 3.80–5.10)
RDW: 12.7 % (ref 11.0–15.0)
Total Lymphocyte: 14.5 %
WBC: 8.1 10*3/uL (ref 3.8–10.8)

## 2022-01-11 LAB — VITAMIN B12: Vitamin B-12: 439 pg/mL (ref 200–1100)

## 2022-01-11 LAB — TSH: TSH: 1.26 mIU/L (ref 0.40–4.50)

## 2022-01-13 NOTE — Progress Notes (Signed)
Kidney function is great.  ?Sodium is great.  ?Protein in normal range.  ?Liver looks wonderful.  ?Thyroid looks great.  ?Cholesterol numbers are amazing.  ?B12 looks good. Are you taking any extra b12?  ? ?Your hemoglobin is low. Likely from diet? Can you tolerate oral iron?  ?No black tarry sticky stools? Your last colonoscopy was 2010? Would you be willing to get another one?

## 2022-01-20 ENCOUNTER — Encounter: Payer: Self-pay | Admitting: Physician Assistant

## 2022-01-22 ENCOUNTER — Other Ambulatory Visit: Payer: Self-pay | Admitting: Neurology

## 2022-01-22 DIAGNOSIS — D649 Anemia, unspecified: Secondary | ICD-10-CM

## 2022-04-18 ENCOUNTER — Ambulatory Visit: Payer: Medicare Other | Admitting: Physician Assistant

## 2022-10-06 ENCOUNTER — Other Ambulatory Visit: Payer: Self-pay | Admitting: Physician Assistant

## 2022-10-06 DIAGNOSIS — Z780 Asymptomatic menopausal state: Secondary | ICD-10-CM

## 2022-10-06 DIAGNOSIS — Z0000 Encounter for general adult medical examination without abnormal findings: Secondary | ICD-10-CM

## 2022-10-15 ENCOUNTER — Ambulatory Visit (INDEPENDENT_AMBULATORY_CARE_PROVIDER_SITE_OTHER): Payer: Medicare Other | Admitting: Physician Assistant

## 2022-10-15 DIAGNOSIS — Z0000 Encounter for general adult medical examination without abnormal findings: Secondary | ICD-10-CM

## 2022-10-15 NOTE — Progress Notes (Signed)
MEDICARE ANNUAL WELLNESS VISIT  10/15/2022  Telephone Visit Disclaimer This Medicare AWV was conducted by telephone due to national recommendations for restrictions regarding the COVID-19 Pandemic (e.g. social distancing).  I verified, using two identifiers, that I am speaking with [name] or their authorized healthcare agent. I discussed the limitations, risks, security, and privacy concerns of performing an evaluation and management service by telephone and the potential availability of an in-person appointment in the future. The patient expressed understanding and agreed to proceed.  Location of Patient: Home Location of Provider (nurse):  In the office.  Subjective:    [name] is a 76 y.o. female patient of Alden Hipp, Royetta Car, PA-C who had a [employer] Visit today via telephone. [name] is Retired and lives with their family. she has 4 children. she reports that she is socially active and does interact with friends/family regularly. she is minimally physically active and enjoys spending time with great grand kids and watching television.  Patient Care Team: Lavada Mesi as PCP - General (Family Medicine) Stanford Breed Denice Bors, MD as PCP - Cardiology (Cardiology) Elsie Stain, MD (Pulmonary Disease)     10/15/2022    2:07 PM 10/11/2021    2:06 PM 01/23/2021    1:03 PM 10/02/2020   11:11 AM 04/19/2019    2:13 PM  Advanced Directives  Does Patient Have a Medical Advance Directive? No No Yes No No  Type of Advance Directive   [place] in Chart?   No - copy requested, Physician notified    Would patient like information on creating a medical advance directive? No - Patient declined No - Patient declined  No - Patient declined No - Patient declined    Hospital Utilization Over the Past 12 Months: # of hospitalizations or ER visits: 0 # of surgeries: 0  Review of Systems     Patient reports that her overall health is unchanged compared to last year.  History obtained from chart review and the patient  Patient Reported Readings (BP, Pulse, CBG, Weight, etc) none  Pain Assessment Pain : No/denies pain     Current Medications & Allergies (verified) Allergies as of 10/15/2022       Reactions   Avelox [moxifloxacin] Other (See Comments)   GI upset/dizzy   Desvenlafaxine Succinate Er Other (See Comments)   shaking   Lisinopril-hydrochlorothiazide Nausea And Vomiting, Cough   Nitrofurantoin Monohyd Macro    Norvasc [amlodipine] Other (See Comments)   Lower extremity edema   Penicillins Hives   Zoloft [sertraline Hcl] Other (See Comments)   "too many side effects"        Medication List        Accurate as of October 15, 2022  2:20 PM. If you have any questions, ask your nurse or doctor.          albuterol 108 (90 Base) MCG/ACT inhaler Commonly known as: VENTOLIN HFA Inhale 2 puffs into the lungs every 6 (six) hours as needed for wheezing or shortness of breath.   aspirin EC 81 MG tablet Take 81 mg by mouth daily.   atorvastatin 20 MG tablet Commonly known as: LIPITOR Take 1 tablet (20 mg total) by mouth at bedtime. Labs for further refills   chlorthalidone 25 MG tablet Commonly known as: HYGROTON Take 1 tablet (25 mg total) by mouth daily.   cloNIDine 0.1 mg/24hr patch Commonly known as: CATAPRES -  Dosed in mg/24 hr Place 1 patch (0.1 mg total) onto the skin once a week.   ipratropium-albuterol 0.5-2.5 (3) MG/3ML Soln Commonly known as: DUONEB Take 3 mLs by nebulization every 4 (four) hours as needed (shortness of breath).   levothyroxine 50 MCG tablet Commonly known as: SYNTHROID Take 1 tablet (50 mcg total) by mouth daily before breakfast. take 1 tablet by mouth once daily   megestrol 40 MG tablet Commonly known as: MEGACE Take 1 tablet (40 mg total) by mouth in the morning, at noon, and at bedtime.   prazosin 1 MG  capsule Commonly known as: MINIPRESS Take 1 capsule (1 mg total) by mouth at bedtime.   QUEtiapine 100 MG tablet Commonly known as: SEROQUEL Take 1 tablet (100 mg total) by mouth at bedtime.   Trelegy Ellipta 100-62.5-25 MCG/ACT Aepb Generic drug: Fluticasone-Umeclidin-Vilant Inhale 1 puff into the lungs daily.   valsartan 320 MG tablet Commonly known as: DIOVAN Take 1 tablet (320 mg total) by mouth daily.   vortioxetine HBr 20 MG Tabs tablet Commonly known as: Trintellix Take 1 tablet (20 mg total) by mouth daily.        History (reviewed): Past Medical History:  Diagnosis Date   Acute respiratory failure ([place]) 09/30/2020   Acute respiratory failure with hypoxia and hypercapnia (HCC) 06/07/2019   Anxiety as acute reaction to exceptional stress 12/05/2015   GAD-7 was 21. 03/2016    Athscl heart disease of native coronary artery w/o ang pctrs 10/21/2019   Bronchopleural fistula (HCC)    CAD (coronary artery disease)    20% stenosis on cath - no intervention required (W-S cards), negative nuclear stress test 11/07   Cancer ([place]) 2007   lung (Dr. Earlie Server and Dr. Arlyce Dice)   CAP (community acquired pneumonia) 09/29/2020   Carotid artery narrowing 02/27/2014   Cavus foot, acquired 04/28/2017   Clinical depression 02/27/2014   PHQ-9 was 21. 03/2016.     COPD (chronic obstructive pulmonary disease) ([place]) 9/10   golds stage III, FeV1 39%   COPD exacerbation ([place]) 09/29/2020   DISEASE, ISCHEMIC HEART, CHRONIC NOS 05/19/2007   Qualifier: Diagnosis of  By: Valetta Close DO, Karen     Dyspnea    Episode of abnormal behavior 11/28/2013   Exertional chest pain 01/26/2018   History of hip fracture 09/02/2013   05/2013. S/p THR L     History of pulmonary embolus (PE) 01/29/2018   Pulmonary embolism   Hurthle cell adenoma of thyroid 2007   Hypercapnia 06/28/2019   Hypertension    Insomnia 09/04/2013   Iron deficiency anemia 2/08   s/p 2 unit transfusion   Left foot pain 02/17/2017   Lung cancer ([place])     Memory changes 12/05/2015   Muscle spasm of back 12/05/2015   Peripheral vascular disease (HCC)    RLS (restless legs syndrome)    Systolic murmur 18/84/1660   Echo normal 09/2013. EF 60-65 percent. trivial regurgitation of tricuspid/mitral valve    Transient cerebral ischemic attack 03/01/2014   Occurred feb 2013.  Managed by Dr. Rema Jasmine. [facility].  On plavix  02/2015 [id number], LICA was 32-20 percent.     Past Surgical History:  Procedure Laterality Date   APPENDECTOMY  18   CHOLECYSTECTOMY  18   heart cartherization     LUL  2005   LUL wedge resection/VATS   LUL  4/08   LUL lobectomy for cystic cavity and Candida, no cancer seen   THYROIDECTOMY, PARTIAL  2007   Dr. Arlyce Dice  Family History  Problem Relation Age of Onset   Cancer Mother        Larynx & Endometrial cancer   Cancer Father        laryngeal cancer   Hypertension Father    Diabetes Sister    Multiple sclerosis Sister    Cancer Sister 31       breast   Social History   Socioeconomic History   Marital status: Widowed    Spouse name: Juanda Crumble   Number of children: 4   Years of education: 10   Highest education level: 10th grade  Occupational History   Occupation: [employer]    Comment: retired  Tobacco Use   Smoking status: Former    Packs/day: 1.50    Years: 30.00    Total pack years: 45.00    Types: Cigarettes    Start date: 1963    Quit date: 10/20/2004    Years since quitting: 17.9   Smokeless tobacco: Never  Vaping Use   Vaping Use: Never used  Substance and Sexual Activity   Alcohol use: No   Drug use: No   Sexual activity: Not Currently  Other Topics Concern   Not on file  Social History Narrative   Lives with her 2 grand daughters, one son in law and two great grand kids. Her husband passed away last year. She enjoys spending time with her great grand kids and watching television.   Social Determinants of Health   Financial Resource Strain: Low Risk   (10/15/2022)   Overall Financial Resource Strain (CARDIA)    Difficulty of Paying Living Expenses: Not hard at all  Food Insecurity: No Food Insecurity (10/15/2022)   Hunger Vital Sign    Worried About Running Out of Food in the Last Year: Never true    Ran Out of Food in the Last Year: Never true  Transportation Needs: No Transportation Needs (10/15/2022)   PRAPARE - Hydrologist (Medical): No    Lack of Transportation (Non-Medical): No  Physical Activity: Inactive (10/15/2022)   Exercise Vital Sign    Days of Exercise per Week: 0 days    Minutes of Exercise per Session: 0 min  Stress: No Stress Concern Present (10/15/2022)   [place]    Feeling of Stress : Not at all  Social Connections: Socially Isolated (10/15/2022)   Social Connection and Isolation Panel [NHANES]    Frequency of Communication with Friends and Family: Twice a week    Frequency of Social Gatherings with Friends and Family: More than three times a week    Attends Religious Services: Never    Marine scientist or Organizations: No    Attends Archivist Meetings: Never    Marital Status: Widowed    Activities of Daily Living    10/15/2022    2:10 PM  In your present state of health, do you have any difficulty performing the following activities:  Hearing? 0  Vision? 0  Difficulty concentrating or making decisions? 1  Comment some memory loss  Walking or climbing stairs? 0  Dressing or bathing? 0  Doing errands, shopping? 1  Comment she doesn't drive.  Preparing Food and eating ? N  Using the Toilet? N  In the past six months, have you accidently leaked urine? N  Do you have problems with loss of bowel control? N  Managing your Medications? N  Managing your Finances?  N  Housekeeping or managing your Housekeeping? N    Patient Education/ Literacy How often do you need to have someone help you  when you read instructions, pamphlets, or other written materials from your doctor or pharmacy?: 1 - Never What is the last grade level you completed in school?: 12th grade  Exercise Current Exercise Habits: The patient does not participate in regular exercise at present, Exercise limited by: None identified  Diet Patient reports consuming 2 meals a day and 4 snack(s) a day Patient reports that her primary diet is: Regular Patient reports that she does have regular access to food.   Depression Screen    10/15/2022    2:08 PM 01/10/2022    1:52 PM 10/11/2021    2:06 PM 01/31/2021    2:15 PM 12/18/2020    4:33 PM 03/20/2020    4:06 PM 12/30/2019    2:34 PM  PHQ 2/9 Scores  PHQ - 2 Score 0 6 0 2 5 6 6   PHQ- 9 Score  19  19 14 21 21      Fall Risk    10/15/2022    2:07 PM 01/10/2022    1:52 PM 10/11/2021    2:06 PM 03/20/2020    4:06 PM 12/30/2019    2:32 PM  Fall Risk   Falls in the past year? 0 0 0 0 1  Number falls in past yr: 0 0 0 0 1  Injury with Fall? 0 0 0 0 1  Risk for fall due to : No Fall Risks No Fall Risks No Fall Risks  History of fall(s)  Follow up Falls evaluation completed Falls evaluation completed Falls evaluation completed Falls evaluation completed Falls evaluation completed     Objective:  [name] seemed alert and oriented and she participated appropriately during our telephone visit.  Blood Pressure Weight BMI  BP Readings from Last 3 Encounters:  01/10/22 (!) 155/61  01/31/21 (!) 142/55  01/30/21 112/70   Wt Readings from Last 3 Encounters:  01/10/22 83 lb (37.6 kg)  01/31/21 89 lb (40.4 kg)  01/30/21 85 lb (38.6 kg)   BMI Readings from Last 1 Encounters:  01/10/22 14.70 kg/m    *Unable to obtain current vital signs, weight, and BMI due to telephone visit type  Hearing/Vision  [name] did not seem to have difficulty with hearing/understanding during the telephone conversation Reports that she has not had a formal eye exam by an eye care  professional within the past year Reports that she has not had a formal hearing evaluation within the past year *Unable to fully assess hearing and vision during telephone visit type  Cognitive Function:    10/15/2022    2:12 PM 10/11/2021    2:13 PM 04/19/2019    2:20 PM  6CIT Screen  What Year? 0 points 0 points 0 points  What month? 0 points 0 points 0 points  What time? 0 points 0 points 0 points  Count back from 20 0 points 0 points 0 points  Months in reverse 4 points 4 points 2 points  Repeat phrase 0 points 0 points 4 points  Total Score 4 points 4 points 6 points   (Normal:0-7, Significant for Dysfunction: >8)  Normal Cognitive Function Screening: Yes   Immunization & Health Maintenance Record Immunization History  Administered Date(s) Administered   Fluad Quad(high Dose 65+) 07/20/2020   H1N1 08/21/2008   Influenza Split 09/02/2011, 09/07/2012   Influenza Whole 08/25/2006, 08/19/2007, 08/21/2008, 07/23/2009, 07/31/2010  Influenza, High Dose Seasonal PF 07/30/2016, 06/18/2018, 01/10/2022   Influenza,inj,Quad PF,6+ Mos 07/20/2013, 08/06/2015, 07/29/2017, 06/21/2019   Influenza-Unspecified 08/21/2008   Moderna Sars-Covid-2 Vaccination 01/03/2020, 01/31/2020   Pneumococcal Conjugate-13 05/11/2014   Pneumococcal Polysaccharide-23 08/25/2006, 11/29/2012   Tdap 03/04/2011, 03/30/2020   Zoster, Live 12/25/2011    Health Maintenance  Topic Date Due   COVID-19 Vaccine (3 - Moderna risk series) 10/31/2022 (Originally 02/28/2020)   Zoster Vaccines- Shingrix (1 of 2) 01/14/2023 (Originally 04/09/1965)   INFLUENZA VACCINE  01/18/2023 (Originally 05/20/2022)   COLONOSCOPY (Pts 45-69yrs Insurance coverage will need to be confirmed)  10/16/2023 (Originally 08/23/2019)   Medicare Annual Wellness (AWV)  10/16/2023   DTaP/Tdap/Td (3 - Td or Tdap) 03/30/2030   Pneumonia Vaccine 55+ Years old  Completed   DEXA SCAN  Completed   Hepatitis C Screening  Completed   HPV VACCINES  Aged  Out       Assessment  This is a routine wellness examination for [name].  Health Maintenance: Due or Overdue There are no preventive care reminders to display for this patient.   Kathy Colace Ector does not need a referral for [employer] Assistance: Care Management:   no Social Work:    no Prescription Assistance:  no Nutrition/Diabetes Education:  no   Plan:  Personalized Goals  Goals Addressed               This Visit's Progress     Patient Stated (pt-stated)        10/15/2022 AWV Goal: Exercise for General Health  Patient will verbalize understanding of the benefits of increased physical activity: Exercising regularly is important. It will improve your overall fitness, flexibility, and endurance. Regular exercise also will improve your overall health. It can help you control your weight, reduce stress, and improve your bone density. Over the next year, patient will increase physical activity as tolerated with a goal of at least 150 minutes of moderate physical activity per week.  You can tell that you are exercising at a moderate intensity if your heart starts beating faster and you start breathing faster but can still hold a conversation. Moderate-intensity exercise ideas include: Walking 1 mile (1.6 km) in about 15 minutes Biking Hiking Golfing Dancing Water aerobics Patient will verbalize understanding of everyday activities that increase physical activity by providing examples like the following: Yard work, such as: Sales promotion account executive Gardening Washing windows or floors Patient will be able to explain general safety guidelines for exercising:  Before you start a new exercise program, talk with your health care provider. Do not exercise so much that you hurt yourself, feel dizzy, or get very short of breath. Wear comfortable clothes and wear shoes with good  support. Drink plenty of water while you exercise to prevent dehydration or heat stroke. Work out until your breathing and your heartbeat get faster.        Personalized Health Maintenance & Screening Recommendations  Influenza vaccine Shingles vaccine Colorectal cancer screening - declined Bone density scan  Lung Cancer Screening Recommended: no (Low Dose CT Chest recommended if Age 62-80 years, 30 pack-year currently smoking OR have quit w/in past 15 years) Hepatitis C Screening recommended: no HIV Screening recommended: no  Advanced Directives: Written information was not prepared per patient's request.  Referrals & Orders No orders of the defined types were placed in this encounter.   Follow-up Plan Follow-up with Donella Stade, PA-C  as planned Schedule influenza and shingles vaccine (2 doses) at the pharmacy. Bone density scan referral has been sent and they will call to schedule. Medicare wellness visit in one year. AVS printed and mailed to the patient.   I have personally reviewed and noted the following in the patient's chart:   Medical and social history Use of alcohol, tobacco or illicit drugs  Current medications and supplements Functional ability and status Nutritional status Physical activity Advanced directives List of other physicians Hospitalizations, surgeries, and ER visits in previous 12 months Vitals Screenings to include cognitive, depression, and falls Referrals and appointments  In addition, I have reviewed and discussed with Kathy Colace Kustra certain preventive protocols, quality metrics, and best practice recommendations. A written personalized care plan for preventive services as well as general preventive health recommendations is available and can be mailed to the patient at her request.      Tinnie Gens, RN BSN  10/15/2022

## 2022-10-15 NOTE — Patient Instructions (Addendum)
[facility] Maintenance Summary and Written Plan of Care  [name] ,  Thank you for allowing me to perform your Medicare Annual Wellness Visit and for your ongoing commitment to your health.   Health Maintenance & Immunization History Health Maintenance  Topic Date Due   COVID-19 Vaccine (3 - Moderna risk series) 10/31/2022 (Originally 02/28/2020)   Zoster Vaccines- Shingrix (1 of 2) 01/14/2023 (Originally 04/09/1965)   INFLUENZA VACCINE  01/18/2023 (Originally 05/20/2022)   COLONOSCOPY (Pts 45-34yrs Insurance coverage will need to be confirmed)  10/16/2023 (Originally 08/23/2019)   Medicare Annual Wellness (AWV)  10/16/2023   DTaP/Tdap/Td (3 - Td or Tdap) 03/30/2030   Pneumonia Vaccine 68+ Years old  Completed   DEXA SCAN  Completed   Hepatitis C Screening  Completed   HPV VACCINES  Aged Out   Immunization History  Administered Date(s) Administered   Fluad Quad(high Dose 65+) 07/20/2020   H1N1 08/21/2008   Influenza Split 09/02/2011, 09/07/2012   Influenza Whole 08/25/2006, 08/19/2007, 08/21/2008, 07/23/2009, 07/31/2010   Influenza, High Dose Seasonal PF 07/30/2016, 06/18/2018, 01/10/2022   Influenza,inj,Quad PF,6+ Mos 07/20/2013, 08/06/2015, 07/29/2017, 06/21/2019   Influenza-Unspecified 08/21/2008   Moderna Sars-Covid-2 Vaccination 01/03/2020, 01/31/2020   Pneumococcal Conjugate-13 05/11/2014   Pneumococcal Polysaccharide-23 08/25/2006, 11/29/2012   Tdap 03/04/2011, 03/30/2020   Zoster, Live 12/25/2011    These are the patient goals that we discussed:  Goals Addressed               This Visit's Progress     Patient Stated (pt-stated)        10/15/2022 AWV Goal: Exercise for General Health  Patient will verbalize understanding of the benefits of increased physical activity: Exercising regularly is important. It will improve your overall fitness, flexibility, and endurance. Regular exercise also will improve your overall health. It can  help you control your weight, reduce stress, and improve your bone density. Over the next year, patient will increase physical activity as tolerated with a goal of at least 150 minutes of moderate physical activity per week.  You can tell that you are exercising at a moderate intensity if your heart starts beating faster and you start breathing faster but can still hold a conversation. Moderate-intensity exercise ideas include: Walking 1 mile (1.6 km) in about 15 minutes Biking Hiking Golfing Dancing Water aerobics Patient will verbalize understanding of everyday activities that increase physical activity by providing examples like the following: Yard work, such as: Sales promotion account executive Gardening Washing windows or floors Patient will be able to explain general safety guidelines for exercising:  Before you start a new exercise program, talk with your health care provider. Do not exercise so much that you hurt yourself, feel dizzy, or get very short of breath. Wear comfortable clothes and wear shoes with good support. Drink plenty of water while you exercise to prevent dehydration or heat stroke. Work out until your breathing and your heartbeat get faster.          This is a list of Health Maintenance Items that are overdue or due now: Influenza vaccine Shingles vaccine Colorectal cancer screening - declined Bone density scan    Orders/Referrals Placed Today: No orders of the defined types were placed in this encounter.  (Contact our referral department at [phone] if you have not spoken with someone about your referral appointment within the next 5 days)    Follow-up Plan Follow-up with [place],  Jade L, PA-C as planned Schedule influenza and shingles vaccine (2 doses) at the pharmacy. Bone density scan referral has been sent and they will call to schedule. Their phone number is  [phone]. Medicare wellness visit in one year. AVS printed and mailed to the patient.      Health Maintenance, Female Adopting a healthy lifestyle and getting preventive care are important in promoting health and wellness. Ask your health care provider about: The right schedule for you to have regular tests and exams. Things you can do on your own to prevent diseases and keep yourself healthy. What should I know about diet, weight, and exercise? Eat a healthy diet  Eat a diet that includes plenty of vegetables, fruits, low-fat dairy products, and lean protein. Do not eat a lot of foods that are high in solid fats, added sugars, or sodium. Maintain a healthy weight Body mass index (BMI) is used to identify weight problems. It estimates body fat based on height and weight. Your health care provider can help determine your BMI and help you achieve or maintain a healthy weight. Get regular exercise Get regular exercise. This is one of the most important things you can do for your health. Most adults should: Exercise for at least 150 minutes each week. The exercise should increase your heart rate and make you sweat (moderate-intensity exercise). Do strengthening exercises at least twice a week. This is in addition to the moderate-intensity exercise. Spend less time sitting. Even light physical activity can be beneficial. Watch cholesterol and blood lipids Have your blood tested for lipids and cholesterol at 75 years of age, then have this test every 5 years. Have your cholesterol levels checked more often if: Your lipid or cholesterol levels are high. You are older than 75 years of age. You are at high risk for heart disease. What should I know about cancer screening? Depending on your health history and family history, you may need to have cancer screening at various ages. This may include screening for: Breast cancer. Cervical cancer. Colorectal cancer. Skin cancer. Lung  cancer. What should I know about heart disease, diabetes, and high blood pressure? Blood pressure and heart disease High blood pressure causes heart disease and increases the risk of stroke. This is more likely to develop in people who have high blood pressure readings or are overweight. Have your blood pressure checked: Every 3-5 years if you are 72-46 years of age. Every year if you are 36 years old or older. Diabetes Have regular diabetes screenings. This checks your fasting blood sugar level. Have the screening done: Once every three years after age 41 if you are at a normal weight and have a low risk for diabetes. More often and at a younger age if you are overweight or have a high risk for diabetes. What should I know about preventing infection? Hepatitis B If you have a higher risk for hepatitis B, you should be screened for this virus. Talk with your health care provider to find out if you are at risk for hepatitis B infection. Hepatitis C Testing is recommended for: Everyone born from [birth year] through 1964. Anyone with known risk factors for hepatitis C. Sexually transmitted infections (STIs) Get screened for STIs, including gonorrhea and chlamydia, if: You are sexually active and are younger than 75 years of age. You are older than 75 years of age and your health care provider tells you that you are at risk for this type of infection. Your sexual activity has changed  since you were last screened, and you are at increased risk for chlamydia or gonorrhea. Ask your health care provider if you are at risk. Ask your health care provider about whether you are at high risk for HIV. Your health care provider may recommend a prescription medicine to help prevent HIV infection. If you choose to take medicine to prevent HIV, you should first get tested for HIV. You should then be tested every 3 months for as long as you are taking the medicine. Pregnancy If you are about to stop having your  period (premenopausal) and you may become pregnant, seek counseling before you get pregnant. Take 400 to 800 micrograms (mcg) of folic acid every day if you become pregnant. Ask for birth control (contraception) if you want to prevent pregnancy. Osteoporosis and menopause Osteoporosis is a disease in which the bones lose minerals and strength with aging. This can result in bone fractures. If you are 32 years old or older, or if you are at risk for osteoporosis and fractures, ask your health care provider if you should: Be screened for bone loss. Take a calcium or vitamin D supplement to lower your risk of fractures. Be given hormone replacement therapy (HRT) to treat symptoms of menopause. Follow these instructions at home: Alcohol use Do not drink alcohol if: Your health care provider tells you not to drink. You are pregnant, may be pregnant, or are planning to become pregnant. If you drink alcohol: Limit how much you have to: 0-1 drink a day. Know how much alcohol is in your drink. In the [place] one drink equals one 12 oz bottle of beer (355 mL), one 5 oz glass of wine (148 mL), or one 1 oz glass of hard liquor (44 mL). Lifestyle Do not use any products that contain nicotine or tobacco. These products include cigarettes, chewing tobacco, and vaping devices, such as e-cigarettes. If you need help quitting, ask your health care provider. Do not use street drugs. Do not share needles. Ask your health care provider for help if you need support or information about quitting drugs. General instructions Schedule regular health, dental, and eye exams. Stay current with your vaccines. Tell your health care provider if: You often feel depressed. You have ever been abused or do not feel safe at home. Summary Adopting a healthy lifestyle and getting preventive care are important in promoting health and wellness. Follow your health care provider's instructions about healthy diet, exercising, and  getting tested or screened for diseases. Follow your health care provider's instructions on monitoring your cholesterol and blood pressure. This information is not intended to replace advice given to you by your health care provider. Make sure you discuss any questions you have with your health care provider. Document Revised: 02/25/2021 Document Reviewed: 02/25/2021 [employer] Patient Education  [place].

## 2023-02-03 ENCOUNTER — Telehealth: Payer: Self-pay

## 2023-02-03 NOTE — Transitions of Care (Post Inpatient/ED Visit) (Signed)
   02/03/2023  Name: [name] [facility] MRN: [id number] DOB: 1946-07-02  Today's TOC FU Call Status: Today's TOC FU Call Status:: Unsuccessul Call (1st Attempt) Unsuccessful Call (1st Attempt) Date: 02/03/23  Attempted to reach the patient regarding the most recent Inpatient/ED visit.  Follow Up Plan: Additional outreach attempts will be made to reach the patient to complete the Transitions of Care (Post Inpatient/ED visit) call.   Signature Karena Addison, LPN [facility] Nurse Health Advisor Direct Dial [phone]

## 2023-02-05 NOTE — Transitions of Care (Post Inpatient/ED Visit) (Signed)
   02/05/2023  Name: Zyrianna Kohn [facility] MRN: [id number] DOB: 09/28/1946  Today's TOC FU Call Status: Today's TOC FU Call Status:: Unsuccessful Call (2nd Attempt) Unsuccessful Call (1st Attempt) Date: 02/03/23 Unsuccessful Call (2nd Attempt) Date: 02/05/23  Attempted to reach the patient regarding the most recent Inpatient/ED visit.  Follow Up Plan: Additional outreach attempts will be made to reach the patient to complete the Transitions of Care (Post Inpatient/ED visit) call.   Signature Karena Addison, LPN [facility] Nurse Health Advisor Direct Dial [phone]

## 2023-02-06 ENCOUNTER — Ambulatory Visit: Payer: Medicare Other | Admitting: Physician Assistant

## 2023-02-16 ENCOUNTER — Telehealth: Payer: Self-pay | Admitting: Physician Assistant

## 2023-02-16 NOTE — Telephone Encounter (Signed)
Multiple phone calls on doctor to sign Death Certificate family would like it done ASAP DOD 2023-02-28

## 2023-02-16 NOTE — Telephone Encounter (Signed)
Vital records comopleted. Please notify funeral home

## 2023-02-18 DEATH — deceased

## 2024-05-24 NOTE — Progress Notes (Signed)
 This encounter was created in error - please disregard.
